# Patient Record
Sex: Female | Born: 1950 | Race: Black or African American | Hispanic: No | Marital: Married | Smoking: Never smoker
Health system: Southern US, Community
[De-identification: ages and names within clinical notes are randomized; demographics above are authoritative.]

## PROBLEM LIST (undated history)

## (undated) DIAGNOSIS — Z01419 Encounter for gynecological examination (general) (routine) without abnormal findings: Secondary | ICD-10-CM

## (undated) DIAGNOSIS — D649 Anemia, unspecified: Secondary | ICD-10-CM

## (undated) DIAGNOSIS — J449 Chronic obstructive pulmonary disease, unspecified: Secondary | ICD-10-CM

## (undated) DIAGNOSIS — I10 Essential (primary) hypertension: Secondary | ICD-10-CM

## (undated) DIAGNOSIS — I639 Cerebral infarction, unspecified: Secondary | ICD-10-CM

## (undated) DIAGNOSIS — M1990 Unspecified osteoarthritis, unspecified site: Secondary | ICD-10-CM

## (undated) DIAGNOSIS — I350 Nonrheumatic aortic (valve) stenosis: Secondary | ICD-10-CM

## (undated) DIAGNOSIS — E785 Hyperlipidemia, unspecified: Secondary | ICD-10-CM

## (undated) HISTORY — DX: Anemia, unspecified: D64.9

## (undated) HISTORY — DX: Essential (primary) hypertension: I10

## (undated) HISTORY — DX: Nonrheumatic aortic (valve) stenosis: I35.0

## (undated) HISTORY — DX: Chronic obstructive pulmonary disease, unspecified: J44.9

## (undated) HISTORY — DX: Cerebral infarction, unspecified: I63.9

## (undated) HISTORY — PX: OTHER SURGICAL HISTORY: SHX169

## (undated) HISTORY — PX: KNEE SURGERY: SHX244

## (undated) HISTORY — DX: Unspecified osteoarthritis, unspecified site: M19.90

## (undated) HISTORY — PX: CATARACT EXTRACTION: SUR2

## (undated) HISTORY — DX: Encounter for gynecological examination (general) (routine) without abnormal findings: Z01.419

## (undated) HISTORY — DX: Hyperlipidemia, unspecified: E78.5

---

## 1998-10-14 ENCOUNTER — Other Ambulatory Visit: Admission: RE | Admit: 1998-10-14 | Discharge: 1998-10-14 | Payer: Self-pay | Admitting: Obstetrics & Gynecology

## 1999-11-22 ENCOUNTER — Other Ambulatory Visit: Admission: RE | Admit: 1999-11-22 | Discharge: 1999-11-22 | Payer: Self-pay | Admitting: Obstetrics & Gynecology

## 2001-09-24 ENCOUNTER — Other Ambulatory Visit: Admission: RE | Admit: 2001-09-24 | Discharge: 2001-09-24 | Payer: Self-pay | Admitting: Obstetrics & Gynecology

## 2001-11-22 ENCOUNTER — Ambulatory Visit (HOSPITAL_COMMUNITY): Admission: RE | Admit: 2001-11-22 | Discharge: 2001-11-22 | Payer: Self-pay | Admitting: Obstetrics & Gynecology

## 2001-11-22 ENCOUNTER — Encounter (INDEPENDENT_AMBULATORY_CARE_PROVIDER_SITE_OTHER): Payer: Self-pay | Admitting: Specialist

## 2002-06-16 ENCOUNTER — Emergency Department (HOSPITAL_COMMUNITY): Admission: EM | Admit: 2002-06-16 | Discharge: 2002-06-16 | Payer: Self-pay

## 2003-04-17 ENCOUNTER — Other Ambulatory Visit: Admission: RE | Admit: 2003-04-17 | Discharge: 2003-04-17 | Payer: Self-pay | Admitting: Obstetrics & Gynecology

## 2004-03-01 ENCOUNTER — Emergency Department (HOSPITAL_COMMUNITY): Admission: EM | Admit: 2004-03-01 | Discharge: 2004-03-02 | Payer: Self-pay | Admitting: Podiatry

## 2005-01-05 ENCOUNTER — Other Ambulatory Visit: Admission: RE | Admit: 2005-01-05 | Discharge: 2005-01-05 | Payer: Self-pay | Admitting: Obstetrics & Gynecology

## 2005-02-22 ENCOUNTER — Encounter: Admission: RE | Admit: 2005-02-22 | Discharge: 2005-02-22 | Payer: Self-pay | Admitting: Orthopedic Surgery

## 2005-11-22 ENCOUNTER — Emergency Department (HOSPITAL_COMMUNITY): Admission: EM | Admit: 2005-11-22 | Discharge: 2005-11-22 | Payer: Self-pay | Admitting: Family Medicine

## 2006-06-27 ENCOUNTER — Emergency Department (HOSPITAL_COMMUNITY): Admission: EM | Admit: 2006-06-27 | Discharge: 2006-06-27 | Payer: Self-pay | Admitting: Emergency Medicine

## 2007-10-26 ENCOUNTER — Ambulatory Visit: Payer: Self-pay | Admitting: Family Medicine

## 2007-10-26 DIAGNOSIS — E782 Mixed hyperlipidemia: Secondary | ICD-10-CM | POA: Insufficient documentation

## 2007-10-26 DIAGNOSIS — I10 Essential (primary) hypertension: Secondary | ICD-10-CM | POA: Insufficient documentation

## 2007-10-26 LAB — HM MAMMOGRAPHY

## 2007-11-05 ENCOUNTER — Encounter: Payer: Self-pay | Admitting: Family Medicine

## 2007-11-20 ENCOUNTER — Ambulatory Visit: Payer: Self-pay | Admitting: Gastroenterology

## 2007-11-20 LAB — CONVERTED CEMR LAB
Basophils Absolute: 0 10*3/uL (ref 0.0–0.1)
Basophils Relative: 0.3 % (ref 0.0–1.0)
Eosinophils Absolute: 0 10*3/uL (ref 0.0–0.6)
Eosinophils Relative: 0.5 % (ref 0.0–5.0)
Ferritin: 52.3 ng/mL (ref 10.0–291.0)
Folate: 8.9 ng/mL
HCT: 30.1 % — ABNORMAL LOW (ref 36.0–46.0)
Hemoglobin: 9.8 g/dL — ABNORMAL LOW (ref 12.0–15.0)
Iron: 30 ug/dL — ABNORMAL LOW (ref 42–145)
Lymphocytes Relative: 27.7 % (ref 12.0–46.0)
MCHC: 32.7 g/dL (ref 30.0–36.0)
MCV: 76.8 fL — ABNORMAL LOW (ref 78.0–100.0)
Monocytes Absolute: 0.6 10*3/uL (ref 0.2–0.7)
Monocytes Relative: 7.1 % (ref 3.0–11.0)
Neutro Abs: 5.8 10*3/uL (ref 1.4–7.7)
Neutrophils Relative %: 64.4 % (ref 43.0–77.0)
Platelets: 222 10*3/uL (ref 150–400)
RBC: 3.91 M/uL (ref 3.87–5.11)
RDW: 15 % — ABNORMAL HIGH (ref 11.5–14.6)
Saturation Ratios: 7.7 % — ABNORMAL LOW (ref 20.0–50.0)
Tissue Transglutaminase Ab, IgA: 0.2 units (ref <7)
Transferrin: 277.2 mg/dL (ref >=212.0)
Vitamin B-12: 409 pg/mL (ref 211–911)
WBC: 8.9 10*3/uL (ref 4.5–10.5)

## 2007-11-21 ENCOUNTER — Ambulatory Visit: Payer: Self-pay | Admitting: Family Medicine

## 2007-11-21 DIAGNOSIS — R531 Weakness: Secondary | ICD-10-CM | POA: Insufficient documentation

## 2007-11-21 DIAGNOSIS — D509 Iron deficiency anemia, unspecified: Secondary | ICD-10-CM | POA: Insufficient documentation

## 2007-11-23 LAB — CONVERTED CEMR LAB
ALT: 13 units/L (ref 0–35)
AST: 18 units/L (ref 0–37)
Albumin: 3.6 g/dL (ref 3.5–5.2)
Alkaline Phosphatase: 68 units/L (ref 39–117)
BUN: 26 mg/dL — ABNORMAL HIGH (ref 6–23)
Bilirubin, Direct: 0.1 mg/dL (ref 0.0–0.3)
CO2: 30 meq/L (ref 19–32)
Calcium: 9.2 mg/dL (ref 8.4–10.5)
Chloride: 102 meq/L (ref 96–112)
Creatinine, Ser: 1 mg/dL (ref 0.4–1.2)
Creatinine,U: 148.5 mg/dL
GFR calc Af Amer: 73 mL/min
GFR calc non Af Amer: 61 mL/min
Glucose, Bld: 141 mg/dL — ABNORMAL HIGH (ref 70–99)
Hgb A1c MFr Bld: 7.9 % — ABNORMAL HIGH (ref 4.6–6.0)
Microalb Creat Ratio: 5.4 mg/g (ref 0.0–30.0)
Microalb, Ur: 0.8 mg/dL (ref 0.0–1.9)
Potassium: 4.5 meq/L (ref 3.5–5.1)
Sodium: 141 meq/L (ref 135–145)
TSH: 1.71 microintl units/mL (ref 0.35–5.50)
Total Bilirubin: 0.7 mg/dL (ref 0.3–1.2)
Total Protein: 7.1 g/dL (ref 6.0–8.3)

## 2007-11-26 ENCOUNTER — Encounter: Payer: Self-pay | Admitting: Family Medicine

## 2007-11-26 ENCOUNTER — Ambulatory Visit: Payer: Self-pay | Admitting: Gastroenterology

## 2007-12-06 ENCOUNTER — Ambulatory Visit: Payer: Self-pay | Admitting: Family Medicine

## 2008-01-09 ENCOUNTER — Ambulatory Visit: Payer: Self-pay | Admitting: Family Medicine

## 2008-01-09 DIAGNOSIS — M545 Low back pain, unspecified: Secondary | ICD-10-CM | POA: Insufficient documentation

## 2008-01-17 ENCOUNTER — Encounter: Payer: Self-pay | Admitting: Family Medicine

## 2008-04-02 ENCOUNTER — Encounter: Payer: Self-pay | Admitting: Family Medicine

## 2008-07-03 ENCOUNTER — Encounter: Payer: Self-pay | Admitting: Family Medicine

## 2008-10-20 ENCOUNTER — Encounter: Payer: Self-pay | Admitting: Family Medicine

## 2009-01-28 ENCOUNTER — Encounter: Payer: Self-pay | Admitting: Family Medicine

## 2009-04-07 ENCOUNTER — Encounter: Payer: Self-pay | Admitting: Cardiology

## 2009-05-07 ENCOUNTER — Ambulatory Visit: Payer: Self-pay | Admitting: Cardiology

## 2009-05-07 ENCOUNTER — Encounter: Payer: Self-pay | Admitting: Family Medicine

## 2009-05-07 DIAGNOSIS — R079 Chest pain, unspecified: Secondary | ICD-10-CM | POA: Insufficient documentation

## 2009-05-07 DIAGNOSIS — E669 Obesity, unspecified: Secondary | ICD-10-CM | POA: Insufficient documentation

## 2009-05-07 DIAGNOSIS — R011 Cardiac murmur, unspecified: Secondary | ICD-10-CM | POA: Insufficient documentation

## 2009-05-18 ENCOUNTER — Encounter: Payer: Self-pay | Admitting: Cardiology

## 2009-05-19 ENCOUNTER — Telehealth (INDEPENDENT_AMBULATORY_CARE_PROVIDER_SITE_OTHER): Payer: Self-pay | Admitting: *Deleted

## 2009-05-20 ENCOUNTER — Ambulatory Visit: Payer: Self-pay

## 2009-05-20 ENCOUNTER — Encounter: Payer: Self-pay | Admitting: Cardiology

## 2009-05-21 ENCOUNTER — Ambulatory Visit: Payer: Self-pay

## 2009-05-27 ENCOUNTER — Telehealth: Payer: Self-pay | Admitting: Cardiology

## 2009-09-10 ENCOUNTER — Encounter: Payer: Self-pay | Admitting: Cardiology

## 2009-09-10 ENCOUNTER — Encounter: Payer: Self-pay | Admitting: Family Medicine

## 2009-09-10 ENCOUNTER — Encounter (INDEPENDENT_AMBULATORY_CARE_PROVIDER_SITE_OTHER): Payer: Self-pay | Admitting: *Deleted

## 2010-01-07 ENCOUNTER — Encounter: Payer: Self-pay | Admitting: Cardiology

## 2010-01-07 ENCOUNTER — Encounter: Payer: Self-pay | Admitting: Family Medicine

## 2010-01-22 ENCOUNTER — Encounter: Payer: Self-pay | Admitting: Cardiology

## 2010-02-11 ENCOUNTER — Ambulatory Visit: Payer: Self-pay | Admitting: Family Medicine

## 2010-04-08 ENCOUNTER — Encounter: Payer: Self-pay | Admitting: Cardiology

## 2010-04-08 ENCOUNTER — Encounter: Payer: Self-pay | Admitting: Family Medicine

## 2010-04-23 ENCOUNTER — Encounter: Payer: Self-pay | Admitting: Cardiology

## 2010-04-26 ENCOUNTER — Ambulatory Visit: Payer: Self-pay | Admitting: Cardiology

## 2010-04-26 DIAGNOSIS — I359 Nonrheumatic aortic valve disorder, unspecified: Secondary | ICD-10-CM | POA: Insufficient documentation

## 2010-05-03 ENCOUNTER — Ambulatory Visit: Payer: Self-pay | Admitting: Cardiology

## 2010-05-03 ENCOUNTER — Ambulatory Visit (HOSPITAL_COMMUNITY): Admission: RE | Admit: 2010-05-03 | Discharge: 2010-05-03 | Payer: Self-pay | Admitting: Cardiology

## 2010-05-03 ENCOUNTER — Ambulatory Visit: Payer: Self-pay

## 2010-05-03 ENCOUNTER — Encounter: Payer: Self-pay | Admitting: Cardiology

## 2010-05-12 ENCOUNTER — Telehealth: Payer: Self-pay | Admitting: Cardiology

## 2010-07-05 ENCOUNTER — Ambulatory Visit: Payer: Self-pay | Admitting: Family Medicine

## 2010-07-20 ENCOUNTER — Encounter: Payer: Self-pay | Admitting: Family Medicine

## 2010-07-20 ENCOUNTER — Encounter: Payer: Self-pay | Admitting: Cardiology

## 2010-08-18 ENCOUNTER — Ambulatory Visit: Payer: Self-pay | Admitting: Family Medicine

## 2010-11-05 ENCOUNTER — Encounter: Payer: Self-pay | Admitting: Cardiology

## 2010-11-09 NOTE — Assessment & Plan Note (Signed)
Summary: 6 month rov/njr   Vital Signs:  Patient profile:   60 year old female Weight:      293 pounds O2 Sat:      94 % Temp:     97.5 degrees F Pulse rate:   56 / minute BP sitting:   140 / 84  (left arm) Cuff size:   large  Vitals Entered By: Pura Spice, RN (August 18, 2010 8:56 AM) CC: 6 month follow up feet edematous    History of Present Illness: Here for a 6 month follow up. She feels fine in general except for some fatigue. She sees Dr. Leslie Dales every 3 months, and her DM has been stable. Her A1c last month was 6.3. She saw Dr. Donne Hazel a few months ago, and he seemed satisfied. He increased her Quinapril, and her BP is stable. He did an ECHO on her which showed good LV function and insignificant valvular disease.   Allergies (verified): No Known Drug Allergies  Past History:  Past Medical History: Reviewed history from 02/11/2010 and no changes required. Diabetes mellitus, type II,  x 15 years (sees Dr. Leslie Dales) Hyperlipidemia x years Hypertension x 15 years Anemia-iron deficiency sees Dr. Jennette Kettle for gyn exams Low back pain, sees Dr. Shirlee Latch Cataracts, (sees Dr. Wayna Chalet) sees Dr. Antoine Poche for cardiology exams ECHO on 05-20-09 showed normal LV function and mild aortic sclerosis  Past Surgical History: Reviewed history from 05/07/2009 and no changes required. D & C Excision of benign cyst from left maxilla and left maxillary sinus colonoscopy per Dr. Jarold Motto 11-26-07, repeat in 10 yrs  Review of Systems  The patient denies anorexia, fever, weight loss, weight gain, vision loss, decreased hearing, hoarseness, chest pain, syncope, dyspnea on exertion, prolonged cough, headaches, hemoptysis, abdominal pain, melena, hematochezia, severe indigestion/heartburn, hematuria, incontinence, genital sores, muscle weakness, suspicious skin lesions, transient blindness, difficulty walking, depression, unusual weight change, abnormal bleeding, enlarged lymph nodes,  angioedema, breast masses, and testicular masses.    Physical Exam  General:  overweight-appearing.   Neck:  No deformities, masses, or tenderness noted. Lungs:  Normal respiratory effort, chest expands symmetrically. Lungs are clear to auscultation, no crackles or wheezes. Heart:  normal rate, regular rhythm, no gallop, no rub, no JVD, and no HJR.  Soft 2/6 SM at the left sternal border  Extremities:  2+ left pedal edema and 2+ right pedal edema.     Impression & Recommendations:  Problem # 1:  AORTIC VALVE DISORDERS (ICD-424.1)  Her updated medication list for this problem includes:    Baby Aspirin 81 Mg Chew (Aspirin) ..... Once daily    Metoprolol Succinate 100 Mg Tb24 (Metoprolol succinate) .Marland Kitchen... 1 by mouth once daily  Problem # 2:  OBESITY, UNSPECIFIED (ICD-278.00)  Problem # 3:  ANEMIA-IRON DEFICIENCY (ICD-280.9)  Her updated medication list for this problem includes:    Ferrex 150 150 Mg Caps (Polysaccharide iron complex) .Marland Kitchen... 1 by mouth daily  Problem # 4:  HYPERTENSION (ICD-401.9)  Her updated medication list for this problem includes:    Doxazosin Mesylate 8 Mg Tabs (Doxazosin mesylate) .Marland Kitchen... 1 by mouth once daily    Quinapril Hcl 40 Mg Tabs (Quinapril hcl) ..... One daily    Metoprolol Succinate 100 Mg Tb24 (Metoprolol succinate) .Marland Kitchen... 1 by mouth once daily    Cartia Xt 300 Mg Cp24 (Diltiazem hcl coated beads) .Marland Kitchen... 1 by mouth once daily    Tekturna Hct 300-25 Mg Tabs (Aliskiren-hydrochlorothiazide) ..... Once daily    Bumetanide  1 Mg Tabs (Bumetanide) .Marland Kitchen... 2 once daily  Problem # 5:  DIABETES MELLITUS, TYPE II (ICD-250.00)  Her updated medication list for this problem includes:    Novolog Penfill 100 Unit/ml Soln (Insulin aspart) .Marland Kitchen... 8 u before lunch,    Lantus Solostar 100 Unit/ml Soln (Insulin glargine) .Marland KitchenMarland KitchenMarland KitchenMarland Kitchen 10 u at bedtime    Glimepiride 4 Mg Tabs (Glimepiride) .Marland Kitchen... 1/2 by mouth daily    Baby Aspirin 81 Mg Chew (Aspirin) ..... Once daily    Quinapril  Hcl 40 Mg Tabs (Quinapril hcl) ..... One daily    Actos 45 Mg Tabs (Pioglitazone hcl) .Marland Kitchen... 1 by mouth once daily    Januvia 100 Mg Tabs (Sitagliptin phosphate) ..... Once daily  Complete Medication List: 1)  Novolog Penfill 100 Unit/ml Soln (Insulin aspart) .... 8 u before lunch, 2)  Lantus Solostar 100 Unit/ml Soln (Insulin glargine) .Marland Kitchen.. 10 u at bedtime 3)  Glimepiride 4 Mg Tabs (Glimepiride) .... 1/2 by mouth daily 4)  Baby Aspirin 81 Mg Chew (Aspirin) .... Once daily 5)  Doxazosin Mesylate 8 Mg Tabs (Doxazosin mesylate) .Marland Kitchen.. 1 by mouth once daily 6)  Quinapril Hcl 40 Mg Tabs (Quinapril hcl) .... One daily 7)  Actos 45 Mg Tabs (Pioglitazone hcl) .Marland Kitchen.. 1 by mouth once daily 8)  Metoprolol Succinate 100 Mg Tb24 (Metoprolol succinate) .Marland Kitchen.. 1 by mouth once daily 9)  Lipitor 40 Mg Tabs (Atorvastatin calcium) .Marland Kitchen.. 1 by mouth once daily 10)  Cartia Xt 300 Mg Cp24 (Diltiazem hcl coated beads) .Marland Kitchen.. 1 by mouth once daily 11)  Methocarbamol 500 Mg Tabs (Methocarbamol) .... As directed 12)  Januvia 100 Mg Tabs (Sitagliptin phosphate) .... Once daily 13)  Tekturna Hct 300-25 Mg Tabs (Aliskiren-hydrochlorothiazide) .... Once daily 14)  Bumetanide 1 Mg Tabs (Bumetanide) .... 2 once daily 15)  Ferrex 150 150 Mg Caps (Polysaccharide iron complex) .Marland Kitchen.. 1 by mouth daily  Patient Instructions: 1)  It is important that you exercise reguarly at least 20 minutes 5 times a week. If you develop chest pain, have severe difficulty breathing, or feel very tired, stop exercising immediately and seek medical attention.  2)  You need to lose weight. Consider a lower calorie diet and regular exercise.  3)  Advised her to watch her sodium intake closely and to keep this below 2000 mg a day. 4)  Please schedule a follow-up appointment in 6 months .    Orders Added: 1)  Est. Patient Level IV [04540]

## 2010-11-09 NOTE — Letter (Signed)
Summary: [facility] Progress Note  [facility] Progress Note   Imported By: Roderic Ovens 01/22/2010 16:11:50  _____________________________________________________________________  External Attachment:    Type:   Image     Comment:   External Document

## 2010-11-09 NOTE — Letter (Signed)
Summary: [facility] Endocrinology & Diabetes  [facility] Endocrinology & Diabetes   Imported By: Maryln Gottron 07/23/2010 10:41:19  _____________________________________________________________________  External Attachment:    Type:   Image     Comment:   External Document

## 2010-11-09 NOTE — Letter (Signed)
Summary: [facility] Endocrinology and Diabetes  [facility]   Imported By: Maryln Gottron 04/22/2010 15:24:53  _____________________________________________________________________  External Attachment:    Type:   Image     Comment:   External Document

## 2010-11-09 NOTE — Letter (Signed)
Summary: [facility] Endocrinology & Diabetes  [facility] Endocrinology & Diabetes   Imported By: Maryln Gottron 01/15/2010 14:34:10  _____________________________________________________________________  External Attachment:    Type:   Image     Comment:   External Document

## 2010-11-09 NOTE — Assessment & Plan Note (Signed)
Summary: CPX/njr   Vital Signs:  Patient profile:   60 year old female Height:      65 inches Weight:      268 pounds BMI:     44.76 Pulse rate:   64 / minute Pulse rhythm:   regular BP sitting:   124 / 62  (left arm) Cuff size:   large  Vitals Entered By: Raechel Ache, RN (Feb 11, 2010 8:59 AM) CC: CPX, labs done recently elsewhere. Sees gyn and cardiologist. FBS 69 today. Is Patient Diabetic? Yes   History of Present Illness: 60 yr old female for a cpx. She feels well in general and has no concerns today. She has been overweight all her life, but she has made tremendous strides over the past few months by seeing a personal trainer to set up an exercise routine for her to follow. She has also been seeing a nutritionist who has changed her diet greatly. Now she avoids all refined sugars, uses brown bread products rather than white, eats lots of vegetables but limited fruits. She has cut back on prok and beef, and she eats more poultry and fish. She has lost some weight and feels better. She had complete labs in March per Dr. Leslie Dales, and these all look great except her mild anemia. Her Hgb is 10.3. Her diabetes is well controlled with an A1c of 6.6.    Allergies (verified): No Known Drug Allergies  Past History:  Past Medical History: Diabetes mellitus, type II,  x 15 years (sees Dr. Leslie Dales) Hyperlipidemia x years Hypertension x 15 years Anemia-iron deficiency sees Dr. Jennette Kettle for gyn exams Low back pain, sees Dr. Shirlee Latch Cataracts, (sees Dr. Wayna Chalet) sees Dr. Antoine Poche for cardiology exams ECHO on 05-20-09 showed normal LV function and mild aortic sclerosis  Past Surgical History: Reviewed history from 05/07/2009 and no changes required. D & C Excision of benign cyst from left maxilla and left maxillary sinus colonoscopy per Dr. Jarold Motto 11-26-07, repeat in 10 yrs  Family History: Reviewed history from 05/07/2009 and no changes required. Family History of  cardiac disease but not CAD  (Mother with a pacemaker, brother with heart valve replacement died) Family History of Colon CA 1st degree relative <60 Family History Diabetes 1st degree relative Family History High cholesterol Family History Hypertension Family History of Stroke F 1st degree relative <60  Social History: Reviewed history from 05/07/2009 and no changes required. Married Never Smoked Alcohol use-no Drug use-no Regular exercise-yes  Review of Systems  The patient denies anorexia, fever, weight loss, weight gain, vision loss, decreased hearing, hoarseness, chest pain, syncope, dyspnea on exertion, peripheral edema, prolonged cough, headaches, hemoptysis, abdominal pain, melena, hematochezia, severe indigestion/heartburn, hematuria, incontinence, genital sores, muscle weakness, suspicious skin lesions, transient blindness, difficulty walking, depression, unusual weight change, abnormal bleeding, enlarged lymph nodes, angioedema, breast masses, and testicular masses.    Physical Exam  General:  overweight-appearing.   Head:  Normocephalic and atraumatic without obvious abnormalities. No apparent alopecia or balding. Eyes:  No corneal or conjunctival inflammation noted. EOMI. Perrla. Funduscopic exam benign, without hemorrhages, exudates or papilledema. Vision grossly normal. Ears:  External ear exam shows no significant lesions or deformities.  Otoscopic examination reveals clear canals, tympanic membranes are intact bilaterally without bulging, retraction, inflammation or discharge. Hearing is grossly normal bilaterally. Nose:  External nasal examination shows no deformity or inflammation. Nasal mucosa are pink and moist without lesions or exudates. Mouth:  Oral mucosa and oropharynx without lesions or exudates.  Teeth in good repair. Neck:  No deformities, masses, or tenderness noted. Chest Wall:  No deformities, masses, or tenderness noted. Lungs:  Normal respiratory effort,  chest expands symmetrically. Lungs are clear to auscultation, no crackles or wheezes. Heart:  normal rate, regular rhythm, no gallop, no rub, and no JVD.  Soft 2/6 SM at the base Abdomen:  Bowel sounds positive,abdomen soft and non-tender without masses, organomegaly or hernias noted. Msk:  No deformity or scoliosis noted of thoracic or lumbar spine.   Pulses:  R and L carotid,radial,femoral,dorsalis pedis and posterior tibial pulses are full and equal bilaterally Extremities:  No clubbing, cyanosis, edema, or deformity noted with normal full range of motion of all joints.   Neurologic:  No cranial nerve deficits noted. Station and gait are normal. Plantar reflexes are down-going bilaterally. DTRs are symmetrical throughout. Sensory, motor and coordinative functions appear intact. Skin:  Intact without suspicious lesions or rashes Cervical Nodes:  No lymphadenopathy noted Axillary Nodes:  No palpable lymphadenopathy Inguinal Nodes:  No significant adenopathy Psych:  Cognition and judgment appear intact. Alert and cooperative with normal attention span and concentration. No apparent delusions, illusions, hallucinations   Impression & Recommendations:  Problem # 1:  WELL ADULT EXAM (ICD-V70.0)  Complete Medication List: 1)  Novolog Penfill 100 Unit/ml Soln (Insulin aspart) .... 8 u before lunch, 2)  Lantus Solostar 100 Unit/ml Soln (Insulin glargine) .Marland Kitchen.. 10 u at bedtime 3)  Glimepiride 4 Mg Tabs (Glimepiride) .Marland Kitchen.. 1 by mouth once daily 4)  Baby Aspirin 81 Mg Chew (Aspirin) .... Once daily 5)  Doxazosin Mesylate 8 Mg Tabs (Doxazosin mesylate) .Marland Kitchen.. 1 by mouth once daily 6)  Quinapril Hcl 40 Mg Tabs (Quinapril hcl) .... 1/2  by mouth once daily 7)  Actos 45 Mg Tabs (Pioglitazone hcl) .Marland Kitchen.. 1 by mouth once daily 8)  Metoprolol Succinate 100 Mg Tb24 (Metoprolol succinate) .Marland Kitchen.. 1 by mouth once daily 9)  Lipitor 40 Mg Tabs (Atorvastatin calcium) .Marland Kitchen.. 1 by mouth once daily 10)  Cartia Xt 300 Mg  Cp24 (Diltiazem hcl coated beads) .Marland Kitchen.. 1 by mouth once daily 11)  Methocarbamol 500 Mg Tabs (Methocarbamol) .... One tid 12)  Tramadol Hcl 50 Mg Tabs (Tramadol hcl) .... One -two q 4 hr. for pain 13)  Januvia 100 Mg Tabs (Sitagliptin phosphate) .... Once daily 14)  Tekturna Hct 300-25 Mg Tabs (Aliskiren-hydrochlorothiazide) .... Once daily 15)  Bumetanide 1 Mg Tabs (Bumetanide) .... 2 once daily  Patient Instructions: 1)  Please schedule a follow-up appointment in 6 months .  2)  It is important that you exercise reguarly at least 20 minutes 5 times a week. If you develop chest pain, have severe difficulty breathing, or feel very tired, stop exercising immediately and seek medical attention.  3)  You need to lose weight. Consider a lower calorie diet and regular exercise.

## 2010-11-09 NOTE — Miscellaneous (Signed)
Clinical Lists Changes  Observations: Added new observation of NUCLEAR NOS: Exercise Capacity: Adenosine study with no exercise. BP Response: Normal blood pressure response. Clinical Symptoms: No chest pain ECG Impression: No significant ST segment change suggestive of ischemia. Overall Impression Comments: Myoview scan with thinning in the anterior/anteroseptal walls (mid).  Most like reflects soft tissue attenuation (breast).  No evidence for significant ischemia.  Overall low risk scan.   (05/20/2009 13:34) Added new observation of ECHOINTERP: 1. Left ventricle: The cavity size was normal. Wall thickness was        increased in a pattern of mild LVH. Systolic function was normal.        The estimated ejection fraction was in the range of 60% to 65%.        Wall motion was normal; there were no regional wall motion        abnormalities. Doppler parameters are consistent with abnormal        left ventricular relaxation (grade 1 diastolic dysfunction).     2. Aortic valve: Sclerosis without stenosis. Mean gradient: 7mm Hg        (S).     3. Mitral valve: Trivial regurgitation.     4. Left atrium: The atrium was mildly dilated.     5. Common pulmonary vein: The Doppler velocity and flow profile were        normal.     6. Right ventricle: The cavity size was normal. Systolic function        was normal.     7. Right atrium: The atrium was mildly dilated.     8. Pulmonary arteries: PA systolic pressure 34-38 mmHg.     9. Systemic veins: IVC measures 2.2 cm with normal respirophasic        variation, suggesting RA pressure 6-10 mmHg.     Impressions:            - Normal LV size and systolic function, EF 60-65%. Mild LV       hypertrophy. Normal RV size and systolic function. Aortic       sclerosis may be the cause of the murmur. (05/20/2009 13:33)      Echocardiogram  Procedure date:  05/20/2009  Findings:      1. Left ventricle: The cavity size was normal. Wall thickness  was        increased in a pattern of mild LVH. Systolic function was normal.        The estimated ejection fraction was in the range of 60% to 65%.        Wall motion was normal; there were no regional wall motion        abnormalities. Doppler parameters are consistent with abnormal        left ventricular relaxation (grade 1 diastolic dysfunction).     2. Aortic valve: Sclerosis without stenosis. Mean gradient: 7mm Hg        (S).     3. Mitral valve: Trivial regurgitation.     4. Left atrium: The atrium was mildly dilated.     5. Common pulmonary vein: The Doppler velocity and flow profile were        normal.     6. Right ventricle: The cavity size was normal. Systolic function        was normal.     7. Right atrium: The atrium was mildly dilated.     8. Pulmonary arteries: PA systolic pressure 34-38 mmHg.     9. Systemic  veins: IVC measures 2.2 cm with normal respirophasic        variation, suggesting RA pressure 6-10 mmHg.     Impressions:            - Normal LV size and systolic function, EF 60-65%. Mild LV       hypertrophy. Normal RV size and systolic function. Aortic       sclerosis may be the cause of the murmur.  Nuclear Study  Procedure date:  05/20/2009  Findings:      Exercise Capacity: Adenosine study with no exercise. BP Response: Normal blood pressure response. Clinical Symptoms: No chest pain ECG Impression: No significant ST segment change suggestive of ischemia. Overall Impression Comments: Myoview scan with thinning in the anterior/anteroseptal walls (mid).  Most like reflects soft tissue attenuation (breast).  No evidence for significant ischemia.  Overall low risk scan.

## 2010-11-09 NOTE — Letter (Signed)
Summary: [facility] Office Progress Note   [facility] Office Progress Note   Imported By: Roderic Ovens 04/21/2010 10:19:13  _____________________________________________________________________  External Attachment:    Type:   Image     Comment:   External Document

## 2010-11-09 NOTE — Progress Notes (Signed)
Summary: echo results   Phone Note Call from Patient   Caller: Mom Reason for Call: Talk to Nurse, Lab or Test Results Summary of Call: PT CALLING RE ECHO RESULTS -PLS CALL [phone] Initial call taken by: Glynda Jaeger,  May 12, 2010 10:28 AM  Follow-up for Phone Call        I called the pt. I gave her a prelimary on her echo results and made her aware that Dr. Antoine Poche has not reviewed her results yet. I explained we will call her back after the final report is reviewed by him. She is agreeable. Follow-up by: Sherri Rad, RN, BSN,  May 12, 2010 11:14 AM

## 2010-11-09 NOTE — Assessment & Plan Note (Signed)
Summary: PER CHECK OUT/SF  Medications Added GLIMEPIRIDE 4 MG  TABS (GLIMEPIRIDE) 1/2 by mouth daily QUINAPRIL HCL 40 MG  TABS (QUINAPRIL HCL) one daily METHOCARBAMOL 500 MG  TABS (METHOCARBAMOL) as directed FERREX 150 150 MG CAPS (POLYSACCHARIDE IRON COMPLEX) 1 by mouth daily      Allergies Added: NKDA  Visit Type:  Follow-up Primary Provider:  Nelwyn Salisbury MD  CC:  HTN and Murmur.  History of Present Illness: The patient presents for followup of mild aortic stenosis and hypertension. I saw her last year and she had chest discomfort. Stress perfusion imaging demonstrated a well preserved ejection fraction with no ischemia or infarct. Echocardiography to evaluate her murmur demonstrated some mild LVH and aortic valve thickening with a minimal gradient. Over the past year she has been on a weight loss program and lost up to 46 pounds though she gained some back. She feels better with this. She still has some breathlessness climbing stairs but no dyspnea at rest, PND or orthopnea. She has some fleeting chest discomfort but no classic symptoms of chest pressure, neck or arm discomfort. There are no new palpitations, presyncope or syncope. She has no new swelling or weight gain.  Current Medications (verified): 1)  Novolog Penfill 100 Unit/ml  Soln (Insulin Aspart) .... 8 U Before Lunch, 2)  Lantus Solostar 100 Unit/ml  Soln (Insulin Glargine) .Marland Kitchen.. 10 U At Bedtime 3)  Glimepiride 4 Mg  Tabs (Glimepiride) .... 1/2 By Mouth Daily 4)  Baby Aspirin 81 Mg  Chew (Aspirin) .... Once Daily 5)  Doxazosin Mesylate 8 Mg  Tabs (Doxazosin Mesylate) .Marland Kitchen.. 1 By Mouth Once Daily 6)  Quinapril Hcl 40 Mg  Tabs (Quinapril Hcl) .... 1/2  By Mouth Once Daily 7)  Actos 45 Mg  Tabs (Pioglitazone Hcl) .Marland Kitchen.. 1 By Mouth Once Daily 8)  Metoprolol Succinate 100 Mg  Tb24 (Metoprolol Succinate) .Marland Kitchen.. 1 By Mouth Once Daily 9)  Lipitor 40 Mg  Tabs (Atorvastatin Calcium) .Marland Kitchen.. 1 By Mouth Once Daily 10)  Cartia Xt 300 Mg  Cp24  (Diltiazem Hcl Coated Beads) .Marland Kitchen.. 1 By Mouth Once Daily 11)  Methocarbamol 500 Mg  Tabs (Methocarbamol) .... As Directed 12)  Januvia 100 Mg  Tabs (Sitagliptin Phosphate) .... Once Daily 13)  Tekturna Hct 300-25 Mg  Tabs (Aliskiren-Hydrochlorothiazide) .... Once Daily 14)  Bumetanide 1 Mg  Tabs (Bumetanide) .... 2 Once Daily 15)  Ferrex 150 150 Mg Caps (Polysaccharide Iron Complex) .Marland Kitchen.. 1 By Mouth Daily  Allergies (verified): No Known Drug Allergies  Past History:  Past Medical History: Reviewed history from 02/11/2010 and no changes required. Diabetes mellitus, type II,  x 15 years (sees Dr. Leslie Dales) Hyperlipidemia x years Hypertension x 15 years Anemia-iron deficiency sees Dr. Jennette Kettle for gyn exams Low back pain, sees Dr. Shirlee Latch Cataracts, (sees Dr. Wayna Chalet) sees Dr. Antoine Poche for cardiology exams ECHO on 05-20-09 showed normal LV function and mild aortic sclerosis  Past Surgical History: Reviewed history from 05/07/2009 and no changes required. D & C Excision of benign cyst from left maxilla and left maxillary sinus colonoscopy per Dr. Jarold Motto 11-26-07, repeat in 10 yrs  Review of Systems       She does complain of some back pain and joint pains. Otherwise as stated in the history of present illness negative for other systems.  Vital Signs:  Patient profile:   60 year old female Height:      65 inches Weight:      281 pounds BMI:  46.93 Pulse rate:   75 / minute Resp:     18 per minute BP sitting:   154 / 82  (right arm)  Vitals Entered By: Marrion Coy, CNA (April 26, 2010 10:11 AM)  Physical Exam  General:  Well developed, well nourished, in no acute distress. Head:  Normocephalic and atraumatic without obvious abnormalities. No apparent alopecia or balding. Eyes:  PERRLA/EOM intact; conjunctiva and lids normal. Neck:  Neck supple, no JVD. No masses, thyromegaly or abnormal cervical nodes. Chest Wall:  no deformities or breast masses noted Lungs:   Clear bilaterally to auscultation and percussion. Abdomen:  Bowel sounds positive,abdomen soft and non-tender without masses, organomegaly or hernias noted.(Exam compromised by morbid obesity.) Msk:  Back normal, normal gait. Muscle strength and tone normal. Extremities:  No clubbing or cyanosis. Neurologic:  Alert and oriented x 3. Skin:  Intact without lesions or rashes. Cervical Nodes:  no significant adenopathy Psych:  Normal affect.   Detailed Cardiovascular Exam  Neck    Carotids: Carotids full and equal bilaterally without bruits, positive or transmitted systolic murmur     Neck Veins: Normal, no JVD.    Heart    Inspection: no deformities or lifts noted.      Palpation: normal PMI with no thrills palpable.      Auscultation: S1 and S2 within normal limits, no S3, no S4, no clicks, no rubs, 3/6 apical systolic murmur radiating up the aortic outflow tract heard best at the right upper sternal border, no diastolic murmurs  Vascular    Abdominal Aorta: no palpable masses, pulsations, or audible bruits.      Pedal Pulses: normal pedal pulses bilaterally.      Radial Pulses: normal radial pulses bilaterally.      Peripheral Circulation: no clubbing, cyanosis, or edema noted with normal capillary refill.     EKG  Procedure date:  04/26/2010  Findings:      Sus rhythm, rate 75, left axis deviation, poor anterior R-wave progression, no acute ST-T wave changes.  Impression & Recommendations:  Problem # 1:  AORTIC VALVE DISORDERS (ICD-424.1) Her murmur may be slightly louder. I have asked her to get another echocardiogram. If this is stable I will probably reassess by physical exam plus or minus echo in 2 years. Orders: Echocardiogram (Echo)  Problem # 2:  OBESITY, UNSPECIFIED (ICD-278.00) I am very proud of her for losing weight and encourage continued gradual weight loss through sensible lifestyle changes.  Problem # 3:  HYPERTENSION (ICD-401.9) She reports that her blood  pressure is typically elevated at this level. I believe that her blood pressure medication dose was reduced as she lost weight. However, she gained some back and so I will go up on the Accupril to 40 mg daily.  Other Orders: EKG w/ Interpretation (93000)  Patient Instructions: 1)  Your physician recommends that you schedule a follow-up appointment in: 24 months with Dr Antoine Poche 2)  Your physician has recommended you make the following change in your medication: Increase Quinipril to 40 mg daily 3)  Your physician has requested that you have an echocardiogram.  Echocardiography is a painless test that uses sound waves to create images of your heart. It provides your doctor with information about the size and shape of your heart and how well your heart's chambers and valves are working.  This procedure takes approximately one hour. There are no restrictions for this procedure. Prescriptions: QUINAPRIL HCL 40 MG  TABS (QUINAPRIL HCL) one daily  #30 x  11   Entered by:   Charolotte Capuchin, RN   Authorized by:   Rollene Rotunda, MD, [facility]   Signed by:   Charolotte Capuchin, RN on 04/26/2010   Method used:   Electronically to        [facility] [address] [phone]* (retail)       [address]       [place], [place]  [address]       Ph: [phone] or [phone]       Fax: [phone]   RxID:   [id number]  I have reviewed and approved all prescriptions at the time of this visit. Rollene Rotunda, MD, [facility]  April 26, 2010 10:42 AM

## 2010-11-09 NOTE — Assessment & Plan Note (Signed)
Summary: FLU-SHOT/RCD   Nurse Visit   Allergies: No Known Drug Allergies  Review of Systems       Flu Vaccine Consent Questions     Do you have a history of severe allergic reactions to this vaccine? no    Any prior history of allergic reactions to egg and/or gelatin? no    Do you have a sensitivity to the preservative Thimersol? no    Do you have a past history of Guillan-Barre Syndrome? no    Do you currently have an acute febrile illness? no    Have you ever had a severe reaction to latex? no    Vaccine information given and explained to patient? yes    Are you currently pregnant? no    Lot Number:[id number]   Exp Date:04/09/2011   Site Given  Left Deltoid IM Pura Spice, RN  July 05, 2010 11:06 AM    Orders Added: 1)  Admin 1st Vaccine [90471] 2)  Flu Vaccine 64yrs + [16109]

## 2010-11-09 NOTE — Letter (Signed)
Summary: [facility] Office Progress Note   [facility] Office Progress Note   Imported By: Roderic Ovens 08/09/2010 10:34:17  _____________________________________________________________________  External Attachment:    Type:   Image     Comment:   External Document

## 2010-12-07 NOTE — Letter (Signed)
Summary: [facility] Office Progress Note   [facility] Office Progress Note   Imported By: Roderic Ovens 12/01/2010 12:39:39  _____________________________________________________________________  External Attachment:    Type:   Image     Comment:   External Document

## 2010-12-31 ENCOUNTER — Encounter: Payer: Self-pay | Admitting: Family Medicine

## 2011-02-15 ENCOUNTER — Encounter: Payer: Self-pay | Admitting: Family Medicine

## 2011-02-16 ENCOUNTER — Ambulatory Visit: Payer: Self-pay | Admitting: Family Medicine

## 2011-02-18 ENCOUNTER — Encounter: Payer: Self-pay | Admitting: Family Medicine

## 2011-02-18 ENCOUNTER — Ambulatory Visit (INDEPENDENT_AMBULATORY_CARE_PROVIDER_SITE_OTHER): Payer: BC Managed Care – PPO | Admitting: Family Medicine

## 2011-02-18 VITALS — BP 142/90 | HR 65 | Temp 98.3°F | Wt 313.0 lb

## 2011-02-18 DIAGNOSIS — J40 Bronchitis, not specified as acute or chronic: Secondary | ICD-10-CM

## 2011-02-18 MED ORDER — AZITHROMYCIN 250 MG PO TABS: ORAL_TABLET | ORAL | Status: AC

## 2011-02-18 MED ORDER — ALBUTEROL SULFATE HFA 108 (90 BASE) MCG/ACT IN AERS: 2.0000 | INHALATION_SPRAY | RESPIRATORY_TRACT | Status: DC | PRN

## 2011-02-18 NOTE — Progress Notes (Signed)
  Subjective:    Patient ID: Krista Benjamin, female    DOB: 09/05/1951, 60 y.o.   MRN: [id number]  HPI Here for 3 days of chest tightness, coughing up green sputum, and PND. No fever.    Review of Systems  Constitutional: Negative.   HENT: Positive for congestion, postnasal drip and sinus pressure.   Eyes: Negative.   Respiratory: Positive for cough.   Cardiovascular: Negative.        Objective:   Physical Exam  Constitutional: She appears well-nourished.  HENT:  Right Ear: External ear normal.  Left Ear: External ear normal.  Nose: Nose normal.  Mouth/Throat: Oropharynx is clear and moist. No oropharyngeal exudate.  Eyes: Conjunctivae are normal. Pupils are equal, round, and reactive to light.  Cardiovascular: Normal rate, regular rhythm, normal heart sounds and intact distal pulses.   Pulmonary/Chest: Effort normal. She has no wheezes. She has no rales.       Scattered rhonchi  Lymphadenopathy:    She has no cervical adenopathy.          Assessment & Plan:  Add Delsym prn

## 2011-02-22 NOTE — Assessment & Plan Note (Signed)
[name]                         GASTROENTEROLOGY OFFICE NOTE   NAME:Benjamin, Krista                  MRN:          [id number]  DATE:11/20/2007                            DOB:          1951/02/15    REFERRING PHYSICIAN:  Tera Mater. Clent Ridges, MD   CHIEF COMPLAINT:  Krista Benjamin is a 61 year old brittle diabetic, referred  by Dr. Tera Mater. Clent Ridges for a screening colonoscopy.   HISTORY:  Krista Benjamin denies any GI complaints whatsoever, except for  occasional alternating diarrhea and constipation.  She denies upper GI  or hepatobiliary problems.  She denies melena, hematochezia or lower  abdominal pain.  What I can ascertain on reviewing her records, she has  never had a colonoscopy or barium studies of her bowels, but apparently  did have a flexible sigmoidoscopy some years ago and was told she had  hemorrhoids.  She is on a diabetic diet.  She denies any specific food  intolerances such as lactose.  Her appetite is good and her weight is  stable.  She has never had pancreatitis, hepatitis or any other  gastrointestinal problems.   PAST MEDICAL HISTORY:  1. Remarkable for insulin-dependent diabetes with some associated      ophthalmologic problems, but no kidney problems or peripheral      neuropathy.  2. She does have essential hypertension.  3. Hypercholesterolemia.  4. She has been told that she has chronic anemia.  In the past she has      been on iron replacement therapy which she currently takes today.   CURRENT MEDICATIONS:  1. Lipitor 40 mg daily.  2. Glimepiride 4 mg daily.  3. Cartia 300 mg daily.  4. Aspirin 81 mg daily.  5. Slo-Iron daily.  6. Quinapril 40 mg daily.  7. Metoprolol 50 mg daily.  8. Tekturna/hydrochlorothiazide 300/25 mg daily.  9. Bumetanide 1 mg, two daily.  10.Actos 45 mg daily.  11.Januvia 1000 mg daily.  12.Doxazosin 8 mg daily.  13.Lantus insulin 50 units each morning.  14.NovoLog insulin 14, 16 and 14 units  before meals three times daily.   ALLERGIES:  Denied.   FAMILY HISTORY:  Her mother apparently had colon carcinoma at age 5.   SOCIAL HISTORY:  She is married and lives with her husband and three  children.  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  She does not smoke or use ethanol.   REVIEW OF SYSTEMS:  Noncontributory.  Her last menstrual period was in  2004.  She denies any cardiovascular complaints except for vague history  of a mini-stroke years ago.  She apparently denies neurological or  psychiatric difficulties.  The review of systems is otherwise  noncontributory and reviewed.   PHYSICAL EXAMINATION:  GENERAL:  She is an obese-appearing black female,  in no acute distress, appearing her stated age.  VITAL SIGNS:  She is 5 feet 5 inches and weighs 305 pounds.  Blood  pressure 144/66, pulse 66 and regular.  HEENT:  I could not appreciate stigmata of chronic liver disease or  thyromegaly.  CHEST:  Generally clear.  HEART:  She appeared  to be in a regular rhythm without murmurs, gallops  or rubs.  ABDOMEN:  Obese, but I could not appreciate hepatosplenomegaly, masses  or tenderness.  Bowel sounds were normal.  EXTREMITIES:  There was mild peripheral edema and she was wearing TED  stockings.  No evidence of phlebitis or swollen joints.  NEUROLOGIC:  Her mental status was clear.  RECTAL:  Deferred.   ASSESSMENT:  1. Need for screening colonoscopy in a 60 year old black female with a      family history of colon carcinoma, who has never had screening.  2. Vague history of chronic iron deficiency anemia, possibly related      to number one.  3. Rather severe insulin-dependent diabetes mellitus.  4. Hypertensive cardiovascular disease.  5. Exogenous obesity.  6. History of hyperlipidemia.   RECOMMENDATIONS:  1. Outpatient colonoscopy with MoviPrep at her convenience.  2. I will ask Dr. Veverly Fells. Altheimer to help [place] with her insulin       adjustment before her procedure, which will require her to be on      clear liquids for 24 hours before the procedure.  Will try to set      up for a morning colonoscopy appointment, so she can be done and      exit the endoscopy laboratory as soon as possible, and restart her      medications.  3. Continue other multiple medications per Dr. Clent Ridges and Dr. Leslie Dales,      as listed above.     Vania Rea. Jarold Motto, MD, [facility], FAGA  Electronically Signed   DRP/MedQ  DD: 11/20/2007  DT: 11/21/2007  Job #: [id number]   cc:   Tera Mater. Clent Ridges, MD  Veverly Fells. [place], M.D.

## 2011-02-25 NOTE — Op Note (Signed)
[facility]  Patient:    Krista Benjamin, Krista Benjamin Visit Number: [id number] MRN: [id number]          Service Type: DSU Location: [facility] Attending Physician:  Minette Headland Dictated by:   Freddy Finner, M.D. Proc. Date: 11/22/01 Admit Date:  11/22/2001                             Operative Report  PREOPERATIVE DIAGNOSIS:       1. Endometrial polyp.                               2. Intramural leiomyomata.  POSTOPERATIVE DIAGNOSES:      1. Endometrial polyp.                               2. Intramural leiomyomata.  OPERATIVE PROCEDURE:          Hysteroscopy and dilatation and curettage.  SURGEON:                      Freddy Finner, M.D.  INTRAOPERATIVE COMPLICATIONS: None.  INTRAOPERATIVE SORBITOL DEFICIT:                      60 cc.  ESTIMATED INTRAOPERATIVE BLOOD LOSS:                   10 cc.  ANESTHESIA:                   General.  HISTORY OF PRESENT ILLNESS:   The patient is a 60 year old with a history of menorrhagia and no palpable increase on examination in the size of the uterus, but this is compromised by patients body habitus.  Ultrasound showed enlargement of the uterus with at least four fibroids, the largest measuring 3 cm and one small fibroid questionably in a subserosal location.  Fibroids compromised adequate ultrasound study of the endometrial lining, but it was slightly thickened.  DESCRIPTION OF PROCEDURE:     The patient was admitted on the morning of surgery, brought to the operating room, placed under adequate general anesthesia, and in dorsal lithotomy position.  Betadine prep was carried out in the usual fashion.  A bivalve speculum was introduced, the cervix was grasped with a single-tooth tenaculum and progressively dilated to 23 with Pratts.  Using the ACMI 12.5 degree hysteroscope with 3% sorbitol as a distending medium, visualization of the endometrial cavity was accomplished. There was sort of an  undulating endometrial cavity with suggestions of at least three areas, where submucous myomas could be slightly bulging into the cavity.  These were not felt to be reasonably resectable.  There was a definite endometrial polyp.  This was resected with thorough curettage and exploration with polyp forceps.  Additional endometrial tissue was sampled, all material was submitted as a single sample.  Reinspection of the cavity revealed absence of the polyp.  Findings were recorded in still photographs, which were retained in the office record.  The patient was awakened at this point and taken to the recovery room in good condition.  She will be discharged with routine outpatient surgical instructions for follow-up in the office in 7-10 days. Dictated by:   Freddy Finner, M.D. Attending Physician:  Minette Headland DD:  11/22/01 TD:  11/22/01  Job: [id number]

## 2011-05-27 ENCOUNTER — Other Ambulatory Visit: Payer: Self-pay | Admitting: *Deleted

## 2011-05-27 MED ORDER — QUINAPRIL HCL 40 MG PO TABS: 40.0000 mg | ORAL_TABLET | Freq: Every day | ORAL | Status: DC

## 2011-07-07 ENCOUNTER — Encounter: Payer: Self-pay | Admitting: Family Medicine

## 2011-07-07 ENCOUNTER — Ambulatory Visit (INDEPENDENT_AMBULATORY_CARE_PROVIDER_SITE_OTHER): Payer: BC Managed Care – PPO | Admitting: Family Medicine

## 2011-07-07 VITALS — BP 130/80 | HR 91 | Temp 98.1°F | Ht 65.5 in | Wt 325.0 lb

## 2011-07-07 DIAGNOSIS — Z23 Encounter for immunization: Secondary | ICD-10-CM

## 2011-07-07 DIAGNOSIS — Z0000 Encounter for general adult medical examination without abnormal findings: Secondary | ICD-10-CM

## 2011-07-07 NOTE — Progress Notes (Signed)
  Subjective:    Patient ID: Krista Benjamin, female    DOB: October 21, 1950, 60 y.o.   MRN: [id number]  HPI 60 yr old female for a cpx. She is doing well in general. She had complete labs in August per Dr. Leslie Dales, and they all came out good. She does mention some swelling in the legs and feet. She takes 2 mg of Bumex every morning. No SOB.    Review of Systems  Constitutional: Negative.   HENT: Negative.   Eyes: Negative.   Respiratory: Negative.   Cardiovascular: Negative.   Gastrointestinal: Negative.   Genitourinary: Negative for dysuria, urgency, frequency, hematuria, flank pain, decreased urine volume, enuresis, difficulty urinating, pelvic pain and dyspareunia.  Musculoskeletal: Negative.   Skin: Negative.   Neurological: Negative.   Hematological: Negative.   Psychiatric/Behavioral: Negative.        Objective:   Physical Exam  Constitutional: She is oriented to person, place, and time. She appears well-developed and well-nourished. No distress.  HENT:  Head: Normocephalic and atraumatic.  Right Ear: External ear normal.  Left Ear: External ear normal.  Nose: Nose normal.  Mouth/Throat: Oropharynx is clear and moist. No oropharyngeal exudate.  Eyes: Conjunctivae and EOM are normal. Pupils are equal, round, and reactive to light. No scleral icterus.  Neck: Normal range of motion. Neck supple. No JVD present. No thyromegaly present.  Cardiovascular: Normal rate, regular rhythm, normal heart sounds and intact distal pulses.  Exam reveals no gallop and no friction rub.   No murmur heard.      EKG normal  Pulmonary/Chest: Effort normal and breath sounds normal. No respiratory distress. She has no wheezes. She has no rales. She exhibits no tenderness.  Abdominal: Soft. Bowel sounds are normal. She exhibits no distension and no mass. There is no tenderness. There is no rebound and no guarding.  Musculoskeletal: Normal range of motion. She exhibits no edema and no tenderness.    Lymphadenopathy:    She has no cervical adenopathy.  Neurological: She is alert and oriented to person, place, and time. She has normal reflexes. No cranial nerve deficit. She exhibits normal muscle tone. Coordination normal.  Skin: Skin is warm and dry. No rash noted. No erythema.  Psychiatric: She has a normal mood and affect. Her behavior is normal. Judgment and thought content normal.          Assessment & Plan:  We discussed getting exercise and losing weight. Given a flu shot

## 2011-07-07 NOTE — Progress Notes (Signed)
Addended by: Aniceto Boss A on: 07/07/2011 12:04 PM   Modules accepted: Orders

## 2011-09-16 ENCOUNTER — Encounter (INDEPENDENT_AMBULATORY_CARE_PROVIDER_SITE_OTHER): Payer: BC Managed Care – PPO | Admitting: Ophthalmology

## 2011-09-16 DIAGNOSIS — E11319 Type 2 diabetes mellitus with unspecified diabetic retinopathy without macular edema: Secondary | ICD-10-CM

## 2011-09-16 DIAGNOSIS — E1165 Type 2 diabetes mellitus with hyperglycemia: Secondary | ICD-10-CM

## 2011-09-16 DIAGNOSIS — H35039 Hypertensive retinopathy, unspecified eye: Secondary | ICD-10-CM

## 2011-09-16 DIAGNOSIS — E1139 Type 2 diabetes mellitus with other diabetic ophthalmic complication: Secondary | ICD-10-CM

## 2011-09-16 DIAGNOSIS — I10 Essential (primary) hypertension: Secondary | ICD-10-CM

## 2011-12-12 ENCOUNTER — Encounter: Payer: Self-pay | Admitting: Family Medicine

## 2011-12-12 ENCOUNTER — Ambulatory Visit (INDEPENDENT_AMBULATORY_CARE_PROVIDER_SITE_OTHER): Payer: BC Managed Care – PPO | Admitting: Family Medicine

## 2011-12-12 ENCOUNTER — Telehealth: Payer: Self-pay | Admitting: Family Medicine

## 2011-12-12 VITALS — BP 130/76 | HR 67 | Temp 98.6°F

## 2011-12-12 DIAGNOSIS — R062 Wheezing: Secondary | ICD-10-CM

## 2011-12-12 DIAGNOSIS — J40 Bronchitis, not specified as acute or chronic: Secondary | ICD-10-CM

## 2011-12-12 MED ORDER — HYDROCODONE-HOMATROPINE 5-1.5 MG/5ML PO SYRP: 5.0000 mL | ORAL_SOLUTION | ORAL | Status: AC | PRN

## 2011-12-12 MED ORDER — AZITHROMYCIN 250 MG PO TABS: ORAL_TABLET | ORAL | Status: AC

## 2011-12-12 MED ORDER — METHYLPREDNISOLONE ACETATE PF 80 MG/ML IJ SUSP
120.0000 mg | Freq: Once | INTRAMUSCULAR | Status: AC
  Administered 2011-12-12: 120 mg via INTRAMUSCULAR

## 2011-12-12 MED ORDER — PREDNISONE (PAK) 10 MG PO TABS: 10.0000 mg | ORAL_TABLET | Freq: Every day | ORAL | Status: AC

## 2011-12-12 MED ORDER — IPRATROPIUM-ALBUTEROL 0.5-2.5 (3) MG/3ML IN SOLN
3.0000 mL | Freq: Once | RESPIRATORY_TRACT | Status: AC
  Administered 2011-12-12: 3 mL via RESPIRATORY_TRACT

## 2011-12-12 NOTE — Telephone Encounter (Signed)
noted 

## 2011-12-12 NOTE — Progress Notes (Signed)
Addended by: Aniceto Boss A on: 12/12/2011 02:00 PM   Modules accepted: Orders

## 2011-12-12 NOTE — Telephone Encounter (Signed)
Pt called and said that she is taking a White HFA. It says 200 metered Inhalations. Pt wanted to make Dr Clent Ridges aware.

## 2011-12-12 NOTE — Progress Notes (Signed)
  Subjective:    Patient ID: Krista Benjamin, female    DOB: 28-Dec-1950, 61 y.o.   MRN: [id number]  HPI Here for 5 days of chest congestion, coughing up green sputum, and wheezing. She had a fever at first but not now. Using her Proair inhaler.    Review of Systems  Constitutional: Negative.   HENT: Negative.   Eyes: Negative.   Respiratory: Positive for cough, chest tightness and wheezing.        Objective:   Physical Exam  Constitutional: She appears well-developed and well-nourished.  HENT:  Right Ear: External ear normal.  Left Ear: External ear normal.  Nose: Nose normal.  Mouth/Throat: Oropharynx is clear and moist. No oropharyngeal exudate.  Eyes: Conjunctivae are normal.  Neck: No thyromegaly present.  Pulmonary/Chest: She has no rales.       Diffuse wheezing and rhonchi   Lymphadenopathy:    She has no cervical adenopathy.          Assessment & Plan:  Given a steroid shot today to be followed by a oral taper. Get on a Zpack. Recheck prn

## 2011-12-13 ENCOUNTER — Telehealth: Payer: Self-pay | Admitting: Family Medicine

## 2011-12-13 NOTE — Telephone Encounter (Signed)
Pt saw you yesterday. When she filled the Rx for the bronchitis at the pharmacy, they told her that they couldn't tell her how to take the med, because the SIG was not clear. Please clarify SIG and have Nettie Elm call pt ASAP so she can start taking the medication. Thanks!

## 2011-12-13 NOTE — Telephone Encounter (Signed)
We already spoke to her and got this straightened out

## 2012-03-22 ENCOUNTER — Encounter (HOSPITAL_COMMUNITY): Payer: Self-pay | Admitting: Emergency Medicine

## 2012-03-22 ENCOUNTER — Emergency Department (HOSPITAL_COMMUNITY)
Admission: EM | Admit: 2012-03-22 | Discharge: 2012-03-22 | Disposition: A | Discharge status: Home / Self Care | Payer: No Typology Code available for payment source | Attending: Emergency Medicine | Admitting: Emergency Medicine

## 2012-03-22 ENCOUNTER — Emergency Department (HOSPITAL_COMMUNITY): Payer: No Typology Code available for payment source

## 2012-03-22 DIAGNOSIS — S161XXA Strain of muscle, fascia and tendon at neck level, initial encounter: Secondary | ICD-10-CM

## 2012-03-22 DIAGNOSIS — T148XXA Other injury of unspecified body region, initial encounter: Secondary | ICD-10-CM

## 2012-03-22 DIAGNOSIS — Y92410 Unspecified street and highway as the place of occurrence of the external cause: Secondary | ICD-10-CM | POA: Insufficient documentation

## 2012-03-22 DIAGNOSIS — S139XXA Sprain of joints and ligaments of unspecified parts of neck, initial encounter: Secondary | ICD-10-CM | POA: Insufficient documentation

## 2012-03-22 DIAGNOSIS — Z7982 Long term (current) use of aspirin: Secondary | ICD-10-CM | POA: Insufficient documentation

## 2012-03-22 DIAGNOSIS — R51 Headache: Secondary | ICD-10-CM | POA: Insufficient documentation

## 2012-03-22 DIAGNOSIS — I10 Essential (primary) hypertension: Secondary | ICD-10-CM | POA: Insufficient documentation

## 2012-03-22 DIAGNOSIS — M79609 Pain in unspecified limb: Secondary | ICD-10-CM | POA: Insufficient documentation

## 2012-03-22 DIAGNOSIS — E119 Type 2 diabetes mellitus without complications: Secondary | ICD-10-CM | POA: Insufficient documentation

## 2012-03-22 DIAGNOSIS — Z79899 Other long term (current) drug therapy: Secondary | ICD-10-CM | POA: Insufficient documentation

## 2012-03-22 DIAGNOSIS — E785 Hyperlipidemia, unspecified: Secondary | ICD-10-CM | POA: Insufficient documentation
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
## 2012-03-22 NOTE — ED Notes (Signed)
Pt alert and oriented, with steady gait at time of discharge. Pt given discharge papers and papers explained. All questions answered and pt wheeled to discharge.  
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
## 2012-03-23 ENCOUNTER — Ambulatory Visit (INDEPENDENT_AMBULATORY_CARE_PROVIDER_SITE_OTHER): Payer: BC Managed Care – PPO | Admitting: Family Medicine

## 2012-03-23 ENCOUNTER — Encounter: Payer: Self-pay | Admitting: Family Medicine

## 2012-03-23 VITALS — BP 128/62 | HR 72 | Temp 98.3°F

## 2012-03-23 DIAGNOSIS — S161XXA Strain of muscle, fascia and tendon at neck level, initial encounter: Secondary | ICD-10-CM

## 2012-03-23 DIAGNOSIS — S139XXA Sprain of joints and ligaments of unspecified parts of neck, initial encounter: Secondary | ICD-10-CM

## 2012-03-23 MED ORDER — DICLOFENAC SODIUM 75 MG PO TBEC: 75.0000 mg | DELAYED_RELEASE_TABLET | Freq: Two times a day (BID) | ORAL | Status: DC | PRN

## 2012-03-23 MED ORDER — CYCLOBENZAPRINE HCL 10 MG PO TABS: 10.0000 mg | ORAL_TABLET | Freq: Three times a day (TID) | ORAL | Status: AC | PRN

## 2012-03-23 NOTE — ED Provider Notes (Signed)
Medical screening examination/treatment/procedure(s) were performed by non-physician practitioner and as supervising physician I was immediately available for consultation/collaboration.   Saige Busby R Travell Desaulniers, MD 03/23/12 2109 
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
## 2012-04-10 ENCOUNTER — Telehealth: Payer: Self-pay | Admitting: Family Medicine

## 2012-04-10 NOTE — Telephone Encounter (Signed)
Forward (faxed) 8 pages to Dr. Gershon Crane on 04-10-12 ym

## 2012-04-24 ENCOUNTER — Ambulatory Visit (INDEPENDENT_AMBULATORY_CARE_PROVIDER_SITE_OTHER): Payer: BC Managed Care – PPO | Admitting: Cardiology

## 2012-04-24 ENCOUNTER — Encounter: Payer: Self-pay | Admitting: Cardiology

## 2012-04-24 VITALS — BP 140/75 | HR 69 | Ht 66.0 in | Wt 319.1 lb

## 2012-04-24 DIAGNOSIS — E785 Hyperlipidemia, unspecified: Secondary | ICD-10-CM

## 2012-04-24 DIAGNOSIS — I10 Essential (primary) hypertension: Secondary | ICD-10-CM

## 2012-04-24 DIAGNOSIS — R079 Chest pain, unspecified: Secondary | ICD-10-CM

## 2012-04-24 DIAGNOSIS — R0989 Other specified symptoms and signs involving the circulatory and respiratory systems: Secondary | ICD-10-CM

## 2012-04-24 DIAGNOSIS — R0609 Other forms of dyspnea: Secondary | ICD-10-CM

## 2012-04-24 DIAGNOSIS — I359 Nonrheumatic aortic valve disorder, unspecified: Secondary | ICD-10-CM

## 2012-04-24 DIAGNOSIS — R0683 Snoring: Secondary | ICD-10-CM

## 2012-04-24 DIAGNOSIS — E669 Obesity, unspecified: Secondary | ICD-10-CM
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
## 2012-04-24 NOTE — Patient Instructions (Addendum)
The current medical regimen is effective;  continue present plan and medications.  Your physician has recommended that you have a sleep study. This test records several body functions during sleep, including: brain activity, eye movement, oxygen and carbon dioxide blood levels, heart rate and rhythm, breathing rate and rhythm, the flow of air through your mouth and nose, snoring, body muscle movements, and chest and belly movement.  Follow up in 2 years with Dr Antoine Poche.  You will receive a letter in the mail 2 months before you are due.  Please call us when you receive this letter to schedule your follow up appointment.

## 2012-04-25 ENCOUNTER — Ambulatory Visit (HOSPITAL_BASED_OUTPATIENT_CLINIC_OR_DEPARTMENT_OTHER): Payer: BC Managed Care – PPO | Attending: Cardiology | Admitting: Radiology

## 2012-04-25 VITALS — Ht 66.0 in | Wt 313.0 lb

## 2012-04-25 DIAGNOSIS — R0683 Snoring: Secondary | ICD-10-CM

## 2012-04-25 DIAGNOSIS — G4733 Obstructive sleep apnea (adult) (pediatric): Secondary | ICD-10-CM

## 2012-04-28 DIAGNOSIS — G4733 Obstructive sleep apnea (adult) (pediatric): Secondary | ICD-10-CM

## 2012-04-29 NOTE — Procedures (Signed)
NAME:  Krista Benjamin, Krista Benjamin         ACCOUNT NO.:  [id number]  MEDICAL RECORD NO.:  [id number]          PATIENT TYPE:  OUT  LOCATION:  SLEEP CENTER                 FACILITY:  [facility]  PHYSICIAN:  Barbaraann Share, MD,FCCPDATE OF BIRTH:  06/29/50  DATE OF STUDY:  04/25/2012                           NOCTURNAL POLYSOMNOGRAM  REFERRING PHYSICIAN:  Rollene Rotunda, MD, [facility]  LOCATION:  Sleep Lab.  INDICATION FOR STUDY:  Hypersomnia with sleep apnea.  EPWORTH SLEEPINESS SCORE:  19.  MEDICATIONS:  SLEEP ARCHITECTURE:  The patient had a total sleep time of only 201 minutes with very little slow-wave sleep and only 39 minutes of REM. Sleep onset latency was prolonged at 93 minutes, and REM onset was normal at 95 minutes.  Sleep efficiency was poor at 54%.  RESPIRATORY DATA:  The patient was found to have 8 apneas and 26 obstructive hypopneas for an apnea-hypopnea index of 10 events per hour. The events were not positional, but were more pronounced during REM. Loud snoring was noted throughout.  The patient did not meet split night criteria secondary to her small numbers of events.  OXYGEN DATA:  There was O2 desaturation as low as 86% with the patient's obstructive events.  CARDIAC DATA:  No clinically significant arrhythmias were noted.  MOVEMENTS-PARASOMNIA:  The patient had no significant leg jerks or other abnormal behaviors noted.  IMPRESSIONS-RECOMMENDATIONS:  Mild obstructive sleep apnea/hypopnea syndrome with an AHI of 10 events per hour and oxygen desaturation as low as 86%.  Treatment for this degree of sleep apnea can include a trial of weight loss alone, upper airway surgery, dental appliance, and also CPAP.  Clinical correlation is suggested.    Barbaraann Share, MD,FCCP Diplomate, American Board of Sleep Medicine   [facility]MED[facility]  D:  04/28/2012 18:20:09  T:  04/29/2012 02:40:24  Job:  [id number]

## 2012-05-15 ENCOUNTER — Telehealth: Payer: Self-pay | Admitting: Cardiology

## 2012-05-15 NOTE — Telephone Encounter (Signed)
Will have Dr Antoine Poche review sleep study results.

## 2012-05-15 NOTE — Telephone Encounter (Signed)
Pt would like results of sleep study

## 2012-05-17 NOTE — Telephone Encounter (Signed)
Pt aware of results and was very grateful

## 2012-05-17 NOTE — Telephone Encounter (Signed)
Dr Antoine Poche reviewed sleep study. It reveals that the pt has only mild sleep apnea that does not require treatment with CPAP

## 2012-07-13 ENCOUNTER — Other Ambulatory Visit: Payer: Self-pay | Admitting: Cardiology

## 2012-07-16 NOTE — Telephone Encounter (Signed)
..   Requested Prescriptions   Pending Prescriptions Disp Refills  . quinapril (ACCUPRIL) 40 MG tablet [Pharmacy Med Name: QUINAPRIL 40 MG TABLET] 30 tablet 6    Sig: TAKE 1 TABLET BY MOUTH EVERY DAY

## 2012-08-03 ENCOUNTER — Encounter: Payer: Self-pay | Admitting: Family Medicine

## 2012-08-03 ENCOUNTER — Ambulatory Visit (INDEPENDENT_AMBULATORY_CARE_PROVIDER_SITE_OTHER): Payer: BC Managed Care – PPO | Admitting: Family Medicine

## 2012-08-03 VITALS — BP 146/80 | Wt 316.0 lb

## 2012-08-03 DIAGNOSIS — L0291 Cutaneous abscess, unspecified: Secondary | ICD-10-CM

## 2012-08-03 DIAGNOSIS — L0390 Cellulitis, unspecified: Secondary | ICD-10-CM

## 2012-08-03 MED ORDER — DOXYCYCLINE HYCLATE 100 MG PO CAPS: 100.0000 mg | ORAL_CAPSULE | Freq: Two times a day (BID) | ORAL | Status: AC

## 2012-08-03 MED ORDER — LEVOFLOXACIN 500 MG PO TABS: 500.0000 mg | ORAL_TABLET | Freq: Every day | ORAL | Status: AC

## 2012-08-03 NOTE — Progress Notes (Signed)
  Subjective:    Patient ID: Krista Benjamin, female    DOB: 21-Sep-1951, 61 y.o.   MRN: [id number]  HPI Here for pain around a wound on the right lower leg which occurred 4 weeks ago when she fell off an elliptical machine. She saw a [facility] a few days after that, and they simply told her to put Bacitracin on it. The broken skin closed up well, but for the past week the area around it has turned dark and is painful. No fevers.    Review of Systems  Constitutional: Negative.   Skin: Positive for wound.       Objective:   Physical Exam  Constitutional: She appears well-developed and well-nourished.  Skin:       The right shin has a large area of macular erythema which is warm, slightly puffy and tender. The original wound site is closed.           Assessment & Plan:  She has cellulitis, and with her diabetes and lower extremity edema we need to treat this aggressively. Start on Levaquin and Doxycycline for 14 days

## 2012-09-17 ENCOUNTER — Ambulatory Visit (INDEPENDENT_AMBULATORY_CARE_PROVIDER_SITE_OTHER): Payer: BC Managed Care – PPO | Admitting: Ophthalmology

## 2012-09-17 DIAGNOSIS — H35039 Hypertensive retinopathy, unspecified eye: Secondary | ICD-10-CM

## 2012-09-17 DIAGNOSIS — E1139 Type 2 diabetes mellitus with other diabetic ophthalmic complication: Secondary | ICD-10-CM

## 2012-09-17 DIAGNOSIS — H2510 Age-related nuclear cataract, unspecified eye: Secondary | ICD-10-CM

## 2012-09-17 DIAGNOSIS — E11319 Type 2 diabetes mellitus with unspecified diabetic retinopathy without macular edema: Secondary | ICD-10-CM

## 2012-09-17 DIAGNOSIS — H43819 Vitreous degeneration, unspecified eye: Secondary | ICD-10-CM

## 2012-09-17 DIAGNOSIS — E1165 Type 2 diabetes mellitus with hyperglycemia: Secondary | ICD-10-CM

## 2012-09-17 DIAGNOSIS — I10 Essential (primary) hypertension: Secondary | ICD-10-CM

## 2012-10-12 ENCOUNTER — Telehealth: Payer: Self-pay | Admitting: Family Medicine

## 2012-10-12 NOTE — Telephone Encounter (Signed)
Only  if the Orthopedist requires me to clear her. If so, she will need to set up an appt for this

## 2012-10-12 NOTE — Telephone Encounter (Signed)
Patient found out she needs to have surgery on her knee and she wants to know if she needs to be seen by Dr. Clent Ridges before her surgery, please advise

## 2012-10-15 NOTE — Telephone Encounter (Signed)
Can you call pt and give the below information and make appointment?

## 2012-10-16 NOTE — Telephone Encounter (Signed)
appt scheduled

## 2012-10-17 ENCOUNTER — Encounter: Payer: Self-pay | Admitting: Family Medicine

## 2012-10-17 ENCOUNTER — Ambulatory Visit (INDEPENDENT_AMBULATORY_CARE_PROVIDER_SITE_OTHER): Payer: BC Managed Care – PPO | Admitting: Family Medicine

## 2012-10-17 VITALS — BP 138/76 | HR 82 | Temp 98.5°F | Wt 312.0 lb

## 2012-10-17 DIAGNOSIS — I10 Essential (primary) hypertension: Secondary | ICD-10-CM

## 2012-10-17 DIAGNOSIS — E119 Type 2 diabetes mellitus without complications: Secondary | ICD-10-CM

## 2012-10-17 DIAGNOSIS — L0291 Cutaneous abscess, unspecified: Secondary | ICD-10-CM

## 2012-10-17 DIAGNOSIS — L0390 Cellulitis, unspecified: Secondary | ICD-10-CM

## 2012-10-17 DIAGNOSIS — M25569 Pain in unspecified knee: Secondary | ICD-10-CM

## 2012-10-17 MED ORDER — LEVOFLOXACIN 500 MG PO TABS: 500.0000 mg | ORAL_TABLET | Freq: Every day | ORAL | Status: AC

## 2012-10-17 MED ORDER — DOXYCYCLINE HYCLATE 100 MG PO CAPS: 100.0000 mg | ORAL_CAPSULE | Freq: Two times a day (BID) | ORAL | Status: AC

## 2012-10-17 NOTE — Progress Notes (Signed)
  Subjective:    Patient ID: Krista Benjamin, female    DOB: 03-Nov-1950, 62 y.o.   MRN: [id number]  HPI Here for a presurgical evaluation. She fell off her treadmill in September and injured the right knee. She has been seeing a Land by the name of Clide Dales DC, and he ordered an MRI of the knee recently. This showed a large meniscal tear, so she went to see an Orthopedist. She is getting some PT but is not responding well, so they are contemplating surgery of some type. In general she had been doing well. Her HTN is stable, and she had a good visit with Dr. Antoine Poche last fall. Her EKG that day was fine. She had a good visit with Dr. Leslie Dales recently, and her A1c was excellent at 6.1. However she has been having some pain in the right lower leg for the past week. She had sustained some abrasions to the leg with the fall lat Septemeber, and she developed some cellulitis at that time. We treated her with levaquin and doxycycline, and she seemed to clear up. She denies any recent fevers.    Review of Systems  Constitutional: Negative.   Respiratory: Negative.   Cardiovascular: Negative.   Skin: Positive for color change.       Objective:   Physical Exam  Constitutional: She appears well-developed and well-nourished.  Cardiovascular: Normal rate, regular rhythm, normal heart sounds and intact distal pulses.   Pulmonary/Chest: Effort normal and breath sounds normal.  Skin:       The right lower leg is swollen, warm, slightly pink, and tender along the shin. The original abrasions have healed over          Assessment & Plan:  In general she seems to be doing well from a cardiovascular and pulmonary perspective, and she should have very low perioperative risk. However the cellulitis on the leg needs to be cleared up before she could ever go into surgery. Treat with Levaquin and Doxycyline and recheck here in 2 weeks.

## 2012-11-07 ENCOUNTER — Ambulatory Visit (INDEPENDENT_AMBULATORY_CARE_PROVIDER_SITE_OTHER): Payer: BC Managed Care – PPO | Admitting: Family Medicine

## 2012-11-07 ENCOUNTER — Encounter: Payer: Self-pay | Admitting: Family Medicine

## 2012-11-07 VITALS — BP 130/76 | HR 72 | Temp 97.9°F | Wt 307.0 lb

## 2012-11-07 DIAGNOSIS — L02419 Cutaneous abscess of limb, unspecified: Secondary | ICD-10-CM

## 2012-11-07 DIAGNOSIS — L03119 Cellulitis of unspecified part of limb: Secondary | ICD-10-CM

## 2012-11-07 NOTE — Progress Notes (Signed)
  Subjective:    Patient ID: Krista Benjamin, female    DOB: 1951-07-28, 62 y.o.   MRN: [id number]  HPI Here to recheck cellulitis on the right lower leg. We saw her 3 weeks ago for this and gave her courses of Doxycycline and Levaquin. She finished these 4 days ago and she feels much better. No more leg pain other than the knee pain.    Review of Systems  Constitutional: Negative.   Musculoskeletal: Positive for arthralgias.       Objective:   Physical Exam  Constitutional: She appears well-developed and well-nourished.  Musculoskeletal:       The right lower leg looks great. It is not warm or red or tender.           Assessment & Plan:  The cellulitis has resolved. I have cleared her for surgery and I think the tentative date is for one week from today

## 2012-12-15 ENCOUNTER — Encounter (HOSPITAL_COMMUNITY): Payer: Self-pay | Admitting: Emergency Medicine

## 2012-12-15 ENCOUNTER — Emergency Department (HOSPITAL_COMMUNITY)
Admission: EM | Admit: 2012-12-15 | Discharge: 2012-12-16 | Disposition: A | Discharge status: Home / Self Care | Payer: BC Managed Care – PPO | Attending: Emergency Medicine | Admitting: Emergency Medicine

## 2012-12-15 DIAGNOSIS — Z9889 Other specified postprocedural states: Secondary | ICD-10-CM | POA: Insufficient documentation

## 2012-12-15 DIAGNOSIS — M25561 Pain in right knee: Secondary | ICD-10-CM

## 2012-12-15 DIAGNOSIS — E119 Type 2 diabetes mellitus without complications: Secondary | ICD-10-CM | POA: Insufficient documentation

## 2012-12-15 DIAGNOSIS — Z794 Long term (current) use of insulin: Secondary | ICD-10-CM | POA: Insufficient documentation

## 2012-12-15 DIAGNOSIS — Z79899 Other long term (current) drug therapy: Secondary | ICD-10-CM | POA: Insufficient documentation

## 2012-12-15 DIAGNOSIS — G8918 Other acute postprocedural pain: Secondary | ICD-10-CM | POA: Insufficient documentation

## 2012-12-15 DIAGNOSIS — Z7982 Long term (current) use of aspirin: Secondary | ICD-10-CM | POA: Insufficient documentation

## 2012-12-15 DIAGNOSIS — Z862 Personal history of diseases of the blood and blood-forming organs and certain disorders involving the immune mechanism: Secondary | ICD-10-CM | POA: Insufficient documentation

## 2012-12-15 DIAGNOSIS — I10 Essential (primary) hypertension: Secondary | ICD-10-CM | POA: Insufficient documentation

## 2012-12-15 DIAGNOSIS — E785 Hyperlipidemia, unspecified: Secondary | ICD-10-CM | POA: Insufficient documentation

## 2012-12-15 DIAGNOSIS — Z8669 Personal history of other diseases of the nervous system and sense organs: Secondary | ICD-10-CM | POA: Insufficient documentation

## 2012-12-15 MED ORDER — NAPROXEN 500 MG PO TABS: 500.0000 mg | ORAL_TABLET | Freq: Two times a day (BID) | ORAL | Status: DC

## 2012-12-15 MED ORDER — KETOROLAC TROMETHAMINE 60 MG/2ML IM SOLN
60.0000 mg | Freq: Once | INTRAMUSCULAR | Status: AC
  Administered 2012-12-15: 60 mg via INTRAMUSCULAR
  Filled 2012-12-15: qty 2

## 2012-12-15 MED ORDER — HYDROCODONE-ACETAMINOPHEN 5-325 MG PO TABS: 1.0000 | ORAL_TABLET | ORAL | Status: DC | PRN

## 2012-12-15 NOTE — ED Notes (Signed)
Patient has right knee surgery 10 days ago.  Patient called Doctor for more pain medication because patient ran out.  [facility] Patient pain currently 9/10 sharp.

## 2012-12-15 NOTE — ED Provider Notes (Signed)
History    This chart was scribed for non-physician practitioner working with Rolan Bucco, MD by Gerlean Ren, ED Scribe. This patient was seen in room TR04C/TR04C and the patient's care was started at 10:40 PM.    CSN: [id number]  Arrival date & time 12/15/12  1647   First MD Initiated Contact with Patient 12/15/12 2239      Chief Complaint  Patient presents with  . Knee Pain  . Medication Refill     The history is provided by the patient. No language interpreter was used.  Krista Benjamin is a 62 y.o. female with h/o DM, HTN, and anemia who presents to the Emergency Department complaining of constant ongoing sharp right knee pain rated as 9/10 associated with arthroscopic surgery to repair meniscus and improve arthritic degeneration 02/26 for which pt has oxycodone prescription but states she does not want anymore because of the way it makes her feel.  Pt has follow-up with orthopedist 03/14.  Pt has never taken hydrocodone.  No blood thinners.  No prior stomach ulcers.    Past Medical History  Diagnosis Date  . Hyperlipidemia   . Anemia   . Cataract   . Diabetes mellitus     sees Dr. Casimiro Needle Altheimer  . Hypertension     sees Dr. Angelina Sheriff  . Gynecological examination     sees Dr. Jennette Kettle     Past Surgical History  Procedure Laterality Date  . Excision of benign cyst      from left maxilla and left maxillary sinus  . Colonoscopy  11-26-07    per Dr. Jarold Motto, repeat in 10 yrs   . Knee surgery      Family History  Problem Relation Age of Onset  . Coronary artery disease Mother   . Coronary artery disease Brother   . Cancer      fhx  . Diabetes      fhx  . Hyperlipidemia      fhx  . Hypertension      fhx  . Stroke      fhx    History  Substance Use Topics  . Smoking status: Never Smoker   . Smokeless tobacco: Never Used  . Alcohol Use: No    No OB history provided.   Review of Systems  Musculoskeletal: Positive for arthralgias (right knee).   All other systems reviewed and are negative.    Allergies  Review of patient's allergies indicates no known allergies.  Home Medications   Current Outpatient Rx  Name  Route  Sig  Dispense  Refill  . aspirin 81 MG tablet   Oral   Take 81 mg by mouth at bedtime.          Marland Kitchen atorvastatin (LIPITOR) 40 MG tablet   Oral   Take 40 mg by mouth at bedtime.          . cholecalciferol (VITAMIN D) 1000 UNITS tablet   Oral   Take 2,000 Units by mouth at bedtime.          Marland Kitchen diltiazem (CARDIZEM CD) 300 MG 24 hr capsule   Oral   Take 300 mg by mouth at bedtime.          Marland Kitchen doxazosin (CARDURA) 8 MG tablet   Oral   Take 8 mg by mouth at bedtime.           Marland Kitchen glimepiride (AMARYL) 4 MG tablet   Oral   Take 2 mg by mouth daily before  breakfast. 1/2 tab daily         . insulin aspart (NOVOLOG) 100 UNIT/ML injection   Subcutaneous   Inject 8 Units into the skin as needed for high blood sugar. Before lunch         . insulin glargine (LANTUS) 100 UNIT/ML injection   Subcutaneous   Inject 10 Units into the skin at bedtime.           . iron polysaccharides (NIFEREX) 150 MG capsule   Oral   Take 150 mg by mouth at bedtime.          Marland Kitchen linagliptin (TRADJENTA) 5 MG TABS tablet   Oral   Take 5 mg by mouth every morning.         . metoprolol (LOPRESSOR) 50 MG tablet   Oral   Take 25 mg by mouth 2 (two) times daily. 1/2 in am 1/2 in pm         . oxycodone (OXY-IR) 5 MG capsule   Oral   Take 5-10 mg by mouth every 4 (four) hours as needed for pain.          . pioglitazone (ACTOS) 45 MG tablet   Oral   Take 45 mg by mouth at bedtime.          . quinapril (ACCUPRIL) 40 MG tablet   Oral   Take 20 mg by mouth at bedtime. Take 1/2         . bumetanide (BUMEX) 1 MG tablet   Oral   Take 2 mg by mouth at bedtime.          Marland Kitchen HYDROcodone-acetaminophen (NORCO/VICODIN) 5-325 MG per tablet   Oral   Take 1 tablet by mouth every 4 (four) hours as needed for  pain.   10 tablet   0   . naproxen (NAPROSYN) 500 MG tablet   Oral   Take 1 tablet (500 mg total) by mouth 2 (two) times daily with a meal.   30 tablet   0     BP 150/58  Pulse 76  Temp(Src) 98.9 F (37.2 C) (Oral)  Resp 22  SpO2 96%  Physical Exam  Nursing note and vitals reviewed. Constitutional: She is oriented to person, place, and time. She appears well-developed and well-nourished.  HENT:  Head: Normocephalic.  Mouth/Throat: Oropharynx is clear and moist.  Eyes: Conjunctivae and EOM are normal. Pupils are equal, round, and reactive to light.  Neck: Normal range of motion. No tracheal deviation present.  Cardiovascular: Normal rate, regular rhythm, normal heart sounds and intact distal pulses.  Exam reveals no gallop and no friction rub.   No murmur heard. Good distal pulses  Pulmonary/Chest: Effort normal and breath sounds normal. No respiratory distress. She has no wheezes. She has no rales.  Musculoskeletal: She exhibits edema and tenderness.       Right knee: She exhibits decreased range of motion (2/2 pain) and swelling. Tenderness found. Medial joint line and lateral joint line tenderness noted.  Neurological: She is alert and oriented to person, place, and time.  Skin: Skin is warm. No rash noted. No erythema.  2 well-healed surgical incisions to right knee from arthroscopic surgery  Psychiatric: She has a normal mood and affect. Her behavior is normal.    ED Course  Procedures (including critical care time) DIAGNOSTIC STUDIES: Oxygen Saturation is 96% on room air, adequate by my interpretation.    COORDINATION OF CARE: 10:51 PM- Discussed with pt naprosyn prescription to be  taken with food and Vicodin prescription to be taken if pain becomes unbearable and warned pt that this may also cause pt to "feel funny."  Offered pt IM anti-inflammatories here and pt accepted.  Advised elevation and ice at home.  Pt understands and agrees with plan.     1. Knee  meniscus pain, right   2. S/P arthroscopy of knee       MDM  [name] percent status post knee arthroscopy with persistent pain. Patient unable to have medication refilled by her orthopedist as it was the weekend and they referred her here to the emergency department. I discussed pain treatment options and we have settled on a person with Vicodin as breakthrough pain control. She is to follow up this week with her orthopedist as scheduled for Friday.  No evidence of cellulitis or subcutaneous joint. Patient alert, oriented, afebrile, non-tachycardic nonseptic, nontoxic appearing.  Pain managed in ED. Pt advised to follow up with orthopedics. Patient will be dc home & is agreeable with above plan.  1. Medications:  naproxyn, vicodin, usual home medications 2. Treatment: rest, drink plenty of fluids, elevate leg, compress and use ice 3. Follow Up: Please followup with your primary doctor for discussion of your diagnoses and further evaluation after today's visit; if you do not have a primary care doctor use the resource guide provided to find one; f/u with orthopedics as discussed  I personally performed the services described in this documentation, which was scribed in my presence. The recorded information has been reviewed and is accurate.        Dahlia Client Muthersbaugh, PA-C 12/15/12 2325

## 2012-12-15 NOTE — ED Notes (Addendum)
Pt reports she had sx on her right knee on 12/05/12 to repair her mensicus. She was told they prescriptions would be called into the pharmacy but when she went to pick them up they said they didn't have them and she called the sx center and they told her they didn't call in prescriptions on the weekends. Pt in wheelchair with foot elevated. Pt in nad, skin warm and dry, resp e/u. Pt reports she has been taking oxycodone at home, last dose at 9am on Friday.

## 2012-12-15 NOTE — ED Provider Notes (Signed)
Medical screening examination/treatment/procedure(s) were performed by non-physician practitioner and as supervising physician I was immediately available for consultation/collaboration.   Rolan Bucco, MD 12/15/12 [phone]

## 2012-12-27 ENCOUNTER — Ambulatory Visit (INDEPENDENT_AMBULATORY_CARE_PROVIDER_SITE_OTHER): Payer: BC Managed Care – PPO | Admitting: Family Medicine

## 2012-12-27 ENCOUNTER — Encounter: Payer: Self-pay | Admitting: Family Medicine

## 2012-12-27 VITALS — BP 180/84 | HR 64 | Temp 98.5°F

## 2012-12-27 DIAGNOSIS — Z23 Encounter for immunization: Secondary | ICD-10-CM

## 2012-12-27 DIAGNOSIS — B372 Candidiasis of skin and nail: Secondary | ICD-10-CM

## 2012-12-27 DIAGNOSIS — Z2911 Encounter for prophylactic immunotherapy for respiratory syncytial virus (RSV): Secondary | ICD-10-CM

## 2012-12-27 MED ORDER — FLUCONAZOLE 200 MG PO TABS: 200.0000 mg | ORAL_TABLET | Freq: Two times a day (BID) | ORAL | Status: DC

## 2012-12-27 NOTE — Addendum Note (Signed)
Addended by: Aniceto Boss A on: 12/27/2012 12:52 PM   Modules accepted: Orders

## 2012-12-27 NOTE — Progress Notes (Signed)
  Subjective:    Patient ID: Krista Benjamin, female    DOB: 1951-04-19, 62 y.o.   MRN: [id number]  HPI Here for 2 weeks of an itchy rash under her abdomen and into both groins. She is getting PT to help rehab from her recent knee surgery.   Review of Systems  Constitutional: Negative.   Skin: Positive for rash.       Objective:   Physical Exam  Constitutional: She appears well-developed and well-nourished.  Skin:  Widespread macular erythematous rash under her large pannus           Assessment & Plan:  Treat with Diflucan. She will keep the area as dry as possible.

## 2013-03-11 ENCOUNTER — Telehealth: Payer: Self-pay | Admitting: Family Medicine

## 2013-03-11 MED ORDER — ALBUTEROL SULFATE HFA 108 (90 BASE) MCG/ACT IN AERS: 2.0000 | INHALATION_SPRAY | RESPIRATORY_TRACT | Status: DC | PRN

## 2013-03-11 NOTE — Telephone Encounter (Signed)
Pt states her pharmacy told her they've tried 2 tiems to get her inhaler refilled, though I see nothing in chart. Pt requesting refill on her albuterol (PROVENTIL HFA;VENTOLIN HFA) 108 (90 BASE) MCG/ACT inhaler  [facility].

## 2013-03-11 NOTE — Telephone Encounter (Signed)
I sent script e-scribe. 

## 2013-03-12 ENCOUNTER — Telehealth: Payer: Self-pay | Admitting: Family Medicine

## 2013-03-12 MED ORDER — HYDROCODONE-HOMATROPINE 5-1.5 MG/5ML PO SYRP: 5.0000 mL | ORAL_SOLUTION | ORAL | Status: DC | PRN

## 2013-03-12 NOTE — Telephone Encounter (Signed)
Given to the patient in the office

## 2013-03-16 ENCOUNTER — Ambulatory Visit (INDEPENDENT_AMBULATORY_CARE_PROVIDER_SITE_OTHER): Payer: BC Managed Care – PPO | Admitting: Family Medicine

## 2013-03-16 ENCOUNTER — Encounter: Payer: Self-pay | Admitting: Family Medicine

## 2013-03-16 VITALS — BP 140/80 | HR 65 | Temp 98.3°F | Wt 298.0 lb

## 2013-03-16 DIAGNOSIS — J209 Acute bronchitis, unspecified: Secondary | ICD-10-CM

## 2013-03-16 DIAGNOSIS — J40 Bronchitis, not specified as acute or chronic: Secondary | ICD-10-CM

## 2013-03-16 MED ORDER — FLUCONAZOLE 150 MG PO TABS: 150.0000 mg | ORAL_TABLET | Freq: Once | ORAL | Status: DC

## 2013-03-16 MED ORDER — AZITHROMYCIN 250 MG PO TABS: ORAL_TABLET | ORAL | Status: DC

## 2013-03-16 NOTE — Patient Instructions (Addendum)
Take the zpack for bronchitis Use the albuterol inhaler as needed for wheeze - if wheezing worsens let us know  If you get a yeast infection-take the diflucan pill with food  (hold your atorvastatin that day)  Update if not starting to improve in a week or if worsening

## 2013-03-16 NOTE — Progress Notes (Signed)
Subjective:    Patient ID: Krista Benjamin, female    DOB: 11/18/1950, 62 y.o.   MRN: [id number]  HPI Here with cough and uri symptoms  Symptoms started with sneezing and cough on Tuesday  Dr Clent Ridges refilled an old hydrocodone cough medicine mid week  There is phlegm but cannot spit it out  Wheezes some  Tends to get wheezing with bronchitis-no asthma occ a bit dizzy Used some albuterol  Was a bit nauseated   phlem is loosening   No otc medicine  Patient Active Problem List   Diagnosis Date Noted  . AORTIC VALVE DISORDERS 04/26/2010  . OBESITY, UNSPECIFIED 05/07/2009  . MURMUR 05/07/2009  . CHEST PAIN 05/07/2009  . LOW BACK PAIN 01/09/2008  . ANEMIA-IRON DEFICIENCY 11/21/2007  . WEAKNESS 11/21/2007  . DIABETES MELLITUS, TYPE II 10/26/2007  . HYPERLIPIDEMIA 10/26/2007  . HYPERTENSION 10/26/2007   Past Medical History  Diagnosis Date  . Hyperlipidemia   . Anemia   . Cataract   . Diabetes mellitus     sees Dr. Casimiro Needle Altheimer  . Hypertension     sees Dr. Angelina Sheriff  . Gynecological examination     sees Dr. Jennette Kettle    Past Surgical History  Procedure Laterality Date  . Excision of benign cyst      from left maxilla and left maxillary sinus  . Colonoscopy  11-26-07    per Dr. Jarold Motto, repeat in 10 yrs   . Knee surgery     History  Substance Use Topics  . Smoking status: Never Smoker   . Smokeless tobacco: Never Used  . Alcohol Use: No   Family History  Problem Relation Age of Onset  . Coronary artery disease Mother   . Coronary artery disease Brother   . Cancer      fhx  . Diabetes      fhx  . Hyperlipidemia      fhx  . Hypertension      fhx  . Stroke      fhx   No Known Allergies Current Outpatient Prescriptions on File Prior to Visit  Medication Sig Dispense Refill  . albuterol (PROVENTIL HFA;VENTOLIN HFA) 108 (90 BASE) MCG/ACT inhaler Inhale 2 puffs into the lungs every 4 (four) hours as needed for wheezing.  1 Inhaler  6  . aspirin 81  MG tablet Take 81 mg by mouth at bedtime.       Marland Kitchen atorvastatin (LIPITOR) 40 MG tablet Take 40 mg by mouth at bedtime.       . bumetanide (BUMEX) 1 MG tablet Take 2 mg by mouth at bedtime.       . cholecalciferol (VITAMIN D) 1000 UNITS tablet Take 2,000 Units by mouth at bedtime.       Marland Kitchen diltiazem (CARDIZEM CD) 300 MG 24 hr capsule Take 300 mg by mouth at bedtime.       Marland Kitchen doxazosin (CARDURA) 8 MG tablet Take 8 mg by mouth at bedtime.        Marland Kitchen glimepiride (AMARYL) 4 MG tablet Take 2 mg by mouth daily before breakfast. 1/2 tab daily      . HYDROcodone-homatropine (HYDROMET) 5-1.5 MG/5ML syrup Take 5 mLs by mouth every 4 (four) hours as needed for cough.  240 mL  0  . insulin aspart (NOVOLOG) 100 UNIT/ML injection Inject 8 Units into the skin as needed for high blood sugar. Before lunch      . insulin glargine (LANTUS) 100 UNIT/ML injection Inject 10 Units into  the skin at bedtime.        . iron polysaccharides (NIFEREX) 150 MG capsule Take 150 mg by mouth at bedtime.       Marland Kitchen linagliptin (TRADJENTA) 5 MG TABS tablet Take 5 mg by mouth every morning.      . metoprolol (LOPRESSOR) 50 MG tablet Take 25 mg by mouth 2 (two) times daily. 1/2 in am 1/2 in pm      . pioglitazone (ACTOS) 45 MG tablet Take 45 mg by mouth at bedtime.       . quinapril (ACCUPRIL) 40 MG tablet Take 20 mg by mouth at bedtime. Take 1/2       No current facility-administered medications on file prior to visit.    Review of Systems Review of Systems  Constitutional: Negative for fever, appetite change, and unexpected weight change.  ENT pos for congestion and post nasal drip  Eyes: Negative for pain and visual disturbance.  Respiratory: Negative for  shortness of breath.   Cardiovascular: Negative for cp or palpitations    Gastrointestinal: Negative for nausea, diarrhea and constipation.  Genitourinary: Negative for urgency and frequency.  Skin: Negative for pallor or rash   Neurological: Negative for weakness,  light-headedness, numbness and headaches.  Hematological: Negative for adenopathy. Does not bruise/bleed easily.  Psychiatric/Behavioral: Negative for dysphoric mood. The patient is not nervous/anxious.         Objective:   Physical Exam  Constitutional: She appears well-developed and well-nourished. No distress.  obese and well appearing   HENT:  Head: Normocephalic and atraumatic.  Right Ear: External ear normal.  Left Ear: External ear normal.  Mouth/Throat: Oropharynx is clear and moist. No oropharyngeal exudate.  Nares are injected and congested  No sinus tenderness   Eyes: Conjunctivae and EOM are normal. Pupils are equal, round, and reactive to light. Right eye exhibits no discharge. Left eye exhibits no discharge. No scleral icterus.  Neck: Normal range of motion. Neck supple. No thyromegaly present.  Cardiovascular: Normal rate and regular rhythm.   Pulmonary/Chest: No respiratory distress. She has wheezes. She has no rales. She exhibits no tenderness.  Harsh bs with scant wheeze on forced exp only  No rales or rhonchi   Lymphadenopathy:    She has no cervical adenopathy.  Neurological: She is alert.  Skin: Skin is warm and dry. No rash noted.  Psychiatric: She has a normal mood and affect.          Assessment & Plan:

## 2013-03-16 NOTE — Assessment & Plan Note (Signed)
With prod cough and mild wheeze Cover with zpak  Diflucan if needed for yeast infection if she gets one Albuterol prn mdi -knows how to use it  Update if not starting to improve in a week or if worsening

## 2013-03-25 ENCOUNTER — Telehealth: Payer: Self-pay | Admitting: Family Medicine

## 2013-03-25 NOTE — Telephone Encounter (Signed)
noted 

## 2013-03-25 NOTE — Telephone Encounter (Signed)
Patient Information:  Caller Name: Krista Benjamin  Phone: [phone]  Patient: Krista Benjamin, Krista Benjamin  Gender: Female  DOB: 03/23/1951  Age: 62 Years  PCP: Gershon Crane [facility])  Office Follow Up:  Does the office need to follow up with this patient?: No  Instructions For The Office: N/A  RN Note:  PT was see on 6-7, [name], diagnosed w/ Bronchitis, Zpack given and finished.  Pt still has productive Cough, white sputum.  Pt denies Chest Pain, Wheezing or Sinus Pain. Cough has improved but not completely gone away.  Pt has hard coughing spell on 6-16 upon waking, vomited small amount.  Pt is currently out of state.  Advised Pt to f/u w/ Urgent Care for re-eval, discussed herbal tea and honey for Cough and advised Pt to f/u w/ Dr Clent Ridges when back in town.  Pt verbalized understanding.   Symptoms  Reason For Call & Symptoms: F/U Bronchitis  Reviewed Health History In EMR: Yes  Reviewed Medications In EMR: Yes  Reviewed Allergies In EMR: Yes  Reviewed Surgeries / Procedures: Yes  Date of Onset of Symptoms: 03/16/2013  Treatments Tried: Zpack and Alburetol  Treatments Tried Worked: No  Guideline(s) Used:  Cough  Disposition Per Guideline:   See Today in Office  Reason For Disposition Reached:   Severe coughing spells (e.g., whooping sound after coughing, vomiting after coughing)  Advice Given:  N/A  Patient Will Follow Care Advice:  YES

## 2013-04-09 ENCOUNTER — Telehealth: Payer: Self-pay | Admitting: Family Medicine

## 2013-04-09 DIAGNOSIS — E119 Type 2 diabetes mellitus without complications: Secondary | ICD-10-CM

## 2013-04-09 NOTE — Telephone Encounter (Signed)
I will refer her to [facility], they are excellent. They will call her about the appt.

## 2013-04-09 NOTE — Telephone Encounter (Signed)
Pt is now seeing Dr. Leslie Dales, however he is moving his practice and patient won't be able to travel the distance to the new one.  Patient would like for Dr. Clent Ridges to refer her to a "good" diabetic doctor.  I explained to her, that Endocrinology would be able to assist with her needs.  She wants to go to whichever doctor Dr Clent Ridges recommends.  Patient would like to have an answer by Thursday because she states that her medical records have to be released by then. Please advise. Thank you.

## 2013-04-09 NOTE — Telephone Encounter (Signed)
I spoke with pt  

## 2013-04-29 ENCOUNTER — Telehealth: Payer: Self-pay | Admitting: Family Medicine

## 2013-04-29 NOTE — Telephone Encounter (Signed)
This is a Dr Clent Ridges pt

## 2013-04-29 NOTE — Telephone Encounter (Signed)
Pt is calling to request a 1 month refill of her bumetanide (BUMEX) 1 MG tablet. [facility] [address] Her appt with Dr. Dan Europe isn't until then end of July, following that appt, they will take care of this RX. Please assist.

## 2013-04-29 NOTE — Telephone Encounter (Signed)
Patient Information:  Caller Name: Janella  Phone: [phone]  Patient: Krista Benjamin, [place]  Gender: Female  DOB: [birth year]  Age: 62 Years  PCP: Artist Pais Doe-Hyun Molly Maduro) (Adults only)  Office Follow Up:  Does the office need to follow up with this patient?: Yes  Instructions For The Office: Please review information - contact patient at  [phone] with further directions expecially concerning B/P medication and Amaryl (that she had mentioned)  RN Note:  Blood sugar this am after breakfast 118  (running 71-167). Pulse readings with BP 68 - 92. Has Appt with Endocrinologist tomorrow 7/22  Symptoms  Reason For Call & Symptoms: Takes BP medication at HS.   Experincing lightheaded, dizziness especially in the mornings. Usually get better of not moving about or if is sitting.  Has been on blood pressure meds:  glimeperide, metoprolol, guinapril and she has halved there meds and then halved them again due to the lightheaded ness.  Has not changed any other meds.  BP this am 91/46.  Asking what to do about blood pressure meds.  Reviewed Health History In EMR: Yes  Reviewed Medications In EMR: Yes  Reviewed Allergies In EMR: Yes  Reviewed Surgeries / Procedures: Yes  Date of Onset of Symptoms: 04/14/2013  Guideline(s) Used:  Dizziness  Disposition Per Guideline:   Discuss with PCP and Callback by Nurse Today  Reason For Disposition Reached:   Taking a medicine that could cause dizziness (e.g., blood pressure medications, diuretics)  Advice Given:  Rest for 1-2 Hours:  Lie down with feet elevated for 1 hour. This will improve blood flow and increase blood flow to the brain.  Stand Up Slowly:  In the mornings, sit up for a few minutes before you stand up. That will help your blood flow make the adjustment.  If you have to stand up for long periods of time, contract and relax your leg muscles to help pump the blood back to the heart.  Sit down or lie down if you feel dizzy.  Call  Back If:  Still feel dizzy after 2 hours of rest and fluids  Passes out (faints)  You become worse.  Patient Will Follow Care Advice:  YES

## 2013-04-30 ENCOUNTER — Telehealth: Payer: Self-pay | Admitting: Family Medicine

## 2013-04-30 ENCOUNTER — Encounter: Payer: Self-pay | Admitting: Family Medicine

## 2013-04-30 ENCOUNTER — Ambulatory Visit (INDEPENDENT_AMBULATORY_CARE_PROVIDER_SITE_OTHER): Payer: BC Managed Care – PPO | Admitting: Family Medicine

## 2013-04-30 VITALS — BP 150/82 | HR 95 | Temp 98.1°F | Wt 300.0 lb

## 2013-04-30 DIAGNOSIS — I10 Essential (primary) hypertension: Secondary | ICD-10-CM

## 2013-04-30 DIAGNOSIS — J019 Acute sinusitis, unspecified: Secondary | ICD-10-CM

## 2013-04-30 MED ORDER — DILTIAZEM HCL ER 240 MG PO CP24: 240.0000 mg | ORAL_CAPSULE | Freq: Every day | ORAL | Status: DC

## 2013-04-30 MED ORDER — QUINAPRIL HCL 20 MG PO TABS: 20.0000 mg | ORAL_TABLET | Freq: Every day | ORAL | Status: DC

## 2013-04-30 MED ORDER — BUMETANIDE 1 MG PO TABS: 2.0000 mg | ORAL_TABLET | Freq: Every day | ORAL | Status: DC

## 2013-04-30 MED ORDER — METOPROLOL TARTRATE 50 MG PO TABS: 50.0000 mg | ORAL_TABLET | Freq: Every day | ORAL | Status: DC

## 2013-04-30 MED ORDER — AMOXICILLIN-POT CLAVULANATE 875-125 MG PO TABS: 1.0000 | ORAL_TABLET | Freq: Two times a day (BID) | ORAL | Status: DC
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
## 2013-04-30 NOTE — Progress Notes (Signed)
  Subjective:    Patient ID: Krista Benjamin, female    DOB: 1951-08-21, 62 y.o.   MRN: [id number]  HPI Here for several things. First she has had a cough for the past month. She has sinus congestion and PND, and this causes a non-productive cough. No fevers. She took a Zpack which did not help much. Also her BP has been dropping low for several weeks, often in the 90s systolic. She has been splitting some of her BP meds, either into halves or even fourths. Her BP meds used to be written by Dr. Leslie Dales, but now we will assume management of this.    Review of Systems  Constitutional: Negative.   HENT: Positive for congestion and postnasal drip.   Eyes: Negative.   Respiratory: Positive for cough. Negative for shortness of breath and wheezing.   Cardiovascular: Negative.        Objective:   Physical Exam  Constitutional: She appears well-developed and well-nourished.  HENT:  Right Ear: External ear normal.  Left Ear: External ear normal.  Nose: Nose normal.  Mouth/Throat: Oropharynx is clear and moist.  Eyes: Conjunctivae are normal.  Neck: No thyromegaly present.  Cardiovascular: Normal rate, regular rhythm, normal heart sounds and intact distal pulses.   Pulmonary/Chest: Effort normal and breath sounds normal.  Lymphadenopathy:    She has no cervical adenopathy.          Assessment & Plan:  Treat the sinusitis with Augmentin. We need to reduce her BP meds. Stop the Cardura completely. Stay on metoprolol 50 mg daily and the Bumex 2 mg daily. Decrease the Diltiazem to 240 mg daily and decrease the Quinapril to 20 mg daily. Recheck in 3-4 weeks.

## 2013-04-30 NOTE — Telephone Encounter (Signed)
Pt is on the schedule for today to see Dr. Clent Ridges 04/30/13.

## 2013-04-30 NOTE — Telephone Encounter (Signed)
She needs an OV for this  

## 2013-05-01 ENCOUNTER — Telehealth: Payer: Self-pay | Admitting: Family Medicine

## 2013-05-01 MED ORDER — GLIMEPIRIDE 4 MG PO TABS: ORAL_TABLET | ORAL | Status: DC

## 2013-05-01 NOTE — Telephone Encounter (Signed)
Pt requested a refill on Amaryl 4 mg take 1/2 tablet every day, also a 90 day supply and I did send e-scribe.

## 2013-05-07 ENCOUNTER — Ambulatory Visit (INDEPENDENT_AMBULATORY_CARE_PROVIDER_SITE_OTHER): Payer: BC Managed Care – PPO | Admitting: Internal Medicine

## 2013-05-07 ENCOUNTER — Encounter: Payer: Self-pay | Admitting: Internal Medicine

## 2013-05-07 VITALS — BP 122/70 | HR 67 | Temp 97.9°F | Resp 16 | Wt 302.0 lb

## 2013-05-07 DIAGNOSIS — E119 Type 2 diabetes mellitus without complications: Secondary | ICD-10-CM

## 2013-05-07 LAB — HEMOGLOBIN A1C: Hgb A1c MFr Bld: 6.5 % (ref 4.6–6.5)

## 2013-05-07 LAB — COMPREHENSIVE METABOLIC PANEL WITH GFR
ALT: 14 U/L (ref 0–35)
AST: 17 U/L (ref 0–37)
Albumin: 3.7 g/dL (ref 3.5–5.2)
Alkaline Phosphatase: 58 U/L (ref 39–117)
BUN: 22 mg/dL (ref 6–23)
CO2: 30 meq/L (ref 19–32)
Calcium: 9.1 mg/dL (ref 8.4–10.5)
Chloride: 106 meq/L (ref 96–112)
Creatinine, Ser: 1.1 mg/dL (ref 0.4–1.2)
GFR: 61.9 mL/min
Glucose, Bld: 53 mg/dL — ABNORMAL LOW (ref 70–99)
Potassium: 3.8 meq/L (ref 3.5–5.1)
Sodium: 142 meq/L (ref 135–145)
Total Bilirubin: 0.5 mg/dL (ref 0.3–1.2)
Total Protein: 7.1 g/dL (ref 6.0–8.3)

## 2013-05-07 NOTE — Progress Notes (Signed)
Patient ID: Krista Benjamin, female   DOB: 15-Jan-1951, 62 y.o.   MRN: [id number]  HPI: Krista Benjamin is a 62 y.o.-year-old female, referred by her PCP, Dr. 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 She previously saw Dr. Leslie Dales until he moved his practice.   Patient has been diagnosed with diabetes in 1994; she started insulin in 1999. Last hemoglobin A1c was: 5.9% on 12/2012, previously 6.1%.  Pt is on a regimen of: - Metformin 1000 mg po bid >> not taking - Lantus 10 units qhs when >150 - Novolog 8 units tid ac, his sugars >140 - very seldom - Amaryl 2 mg daily - Tradjenta 5 mg daily - Actos 45 mg daily since 07/2001  Pt checks her sugars 2x day and they are: - am: 74-100 - bedtime: 70-100 Lowest CBG 68; she has hypoglycemia awareness at 70. Highest sugar was 200 after eating starches  I reviewed Dr. Altheimer's last note on 01/02/2013, she was having some mild low blood sugars in a.m. At the time that he was seeing her.  Pt's meals are: - Breakfast: skips (sleeps late) - Lunch: meat + veggies - Dinner: meat + veggies - Snacks: 1-2, fruit, yoghurt She started to eat only meat and vegetables >> sugars improved.  Had a meniscus surgery, but needs to return at a gym now.  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 Latest ACR was 9.4 on 12/2012. - last lipid profile (on Pravastatin: 128/56/42/68 (12/2012). - last eye exam was in 09/2012 by Dr. Ashley Royalty: Background diabetic retinopathy - no numbness and tingling in her legs  I reviewed her chart and she also has a history of hypertension, hyperlipidemia-on Lipitor, iron deficiency anemia-on iron therapy, aortic valve disease/murmur, left ventricular hypertrophy per EKG, obesity, multinodular goiter-recent TSH 1.6 on 09/2012, fibroids.  Pt has FH of DM in father, brother, son.  ROS: Constitutional: no weight  gain/loss, no fatigue, no subjective hyperthermia/hypothermia Eyes: no blurry vision, no xerophthalmia ENT: no sore throat, no nodules palpated in throat, no dysphagia/odynophagia, no hoarseness Cardiovascular: no CP/SOB/palpitations/leg swelling Respiratory: no cough/SOB Gastrointestinal: no N/V/D/C Musculoskeletal: no muscle/joint aches Skin: no rashes Neurological: no tremors/numbness/tingling/dizziness Psychiatric: no depression/anxiety  Past Medical History  Diagnosis Date  . Hyperlipidemia   . Anemia   . Cataract   . Hypertension     sees Dr. Angelina Sheriff  . Gynecological examination     sees Dr. Jennette Kettle   . Diabetes mellitus     sees Dr. Elvera Lennox    Past Surgical History  Procedure Laterality Date  . Excision of benign cyst      from left maxilla and left maxillary sinus  . Colonoscopy  11-26-07    per Dr. Jarold Motto, repeat in 10 yrs   . Knee surgery     History   Social History  . Marital Status: Married    Spouse Name: N/A    Number of Children: N/A  . Years of Education: N/A   Occupational History  . Not on file.   Social History Main Topics  . Smoking status: Never Smoker   . Smokeless tobacco: Never Used  . Alcohol Use: No  . Drug Use: No  . Sexually Active: Not on file   Other Topics Concern  . Not on file   Social History Narrative  . No narrative on file    Current Outpatient Prescriptions on File Prior to Visit  Medication Sig Dispense  Refill  . albuterol (PROVENTIL HFA;VENTOLIN HFA) 108 (90 BASE) MCG/ACT inhaler Inhale 2 puffs into the lungs every 4 (four) hours as needed for wheezing.  1 Inhaler  6  . amoxicillin-clavulanate (AUGMENTIN) 875-125 MG per tablet Take 1 tablet by mouth 2 (two) times daily.  20 tablet  0  . aspirin 81 MG tablet Take 81 mg by mouth at bedtime.       Marland Kitchen atorvastatin (LIPITOR) 40 MG tablet Take 40 mg by mouth at bedtime.       . bumetanide (BUMEX) 1 MG tablet Take 2 tablets (2 mg total) by mouth at bedtime.  60 tablet   11  . cholecalciferol (VITAMIN D) 1000 UNITS tablet Take 2,000 Units by mouth at bedtime.       Marland Kitchen diltiazem (DILACOR XR) 240 MG 24 hr capsule Take 1 capsule (240 mg total) by mouth daily.  30 capsule  11  . glimepiride (AMARYL) 4 MG tablet Take 1/2 tablet every day  90 tablet  3  . insulin aspart (NOVOLOG) 100 UNIT/ML injection Inject 8 Units into the skin as needed for high blood sugar. Before lunch      . insulin glargine (LANTUS) 100 UNIT/ML injection Inject 10 Units into the skin at bedtime.        . iron polysaccharides (NIFEREX) 150 MG capsule Take 150 mg by mouth at bedtime.       Marland Kitchen linagliptin (TRADJENTA) 5 MG TABS tablet Take 5 mg by mouth every morning.      . pioglitazone (ACTOS) 45 MG tablet Take 45 mg by mouth at bedtime.       . quinapril (ACCUPRIL) 20 MG tablet Take 1 tablet (20 mg total) by mouth at bedtime.  30 tablet  11  . HYDROcodone-homatropine (HYDROMET) 5-1.5 MG/5ML syrup Take 5 mLs by mouth every 4 (four) hours as needed for cough.  240 mL  0  . metoprolol (LOPRESSOR) 50 MG tablet Take 1 tablet (50 mg total) by mouth daily.  30 tablet  11   No current facility-administered medications on file prior to visit.   No Known Allergies  Family History  Problem Relation Age of Onset  . Coronary artery disease Mother   . Coronary artery disease Brother   . Cancer      fhx  . Diabetes      fhx  . Hyperlipidemia      fhx  . Hypertension      fhx  . Stroke      fhx   PE: BP 122/70  Pulse 67  Temp(Src) 97.9 F (36.6 C) (Oral)  Resp 16  Wt 302 lb (136.986 kg)  BMI 48.77 kg/m2  SpO2 97% Wt Readings from Last 3 Encounters:  05/07/13 302 lb (136.986 kg)  04/30/13 300 lb (136.079 kg)  03/16/13 298 lb (135.172 kg)   Constitutional: obese, in NAD Eyes: PERRLA, EOMI, no exophthalmos ENT: moist mucous membranes, no thyromegaly, no cervical lymphadenopathy Cardiovascular: RRR, No MRG, + nonpitting edema lower legs Respiratory: CTA B Gastrointestinal: abdomen soft,  NT, ND, BS+ Musculoskeletal: no deformities, strength intact in all 4 Skin: moist, warm, stasis dermatitis on legs Neurological: no tremor with outstretched hands, DTR normal in all 4  ASSESSMENT: 1. DM2, insulin-dependent, controlled, with complications - CKD stage 2-3 - background DR  PLAN:  1.  Patient with long-standing diabetes, with very good control, especially lately, but on a rather complex medication regimen. She is not on metformin, and mentions that she has not  been on this medication, I am not sure why. Her kidney function is borderline for metformin use, so will continue to stay off the metformin, especially since her hemoglobin A1c was 5.9 at last check. We downloaded her meter, and the sugars are almost entirely at goal. Low sugar was 65 x1 in last month. - I advised her to stop Actos - she tells me that she has wanted to come off the medication since she heard about the side effects  - at next visit, we might be able to stop Lantus. For now, she is using both Lantus and NovoLog as backup. - continue the following: - Lantus 10 units qhs when >150  - Novolog 8 units tid ac, his sugars >140 - Amaryl 2 mg daily  - Tradjenta 5 mg daily - given sugar log and advised how to fill it and to bring it at next appt - check sugars once a day (can also check twice if she prefers), rotating checks times - given foot care handout and explained the principles - given instructions for hypoglycemia management "15-15 rule" - will check a hemoglobin A1c and a CMP at this visit. No ACR needed since on ACE inhibitor. - I will see her back in 3 months with her sugar log   Office Visit on 05/07/2013  Component Date Value Range Status  . Hemoglobin A1C 05/07/2013 6.5  4.6 - 6.5 % Final   Glycemic Control Guidelines for People with Diabetes:Non Diabetic:  <6%Goal of Therapy: <7%Additional Action Suggested:  >8%   . Sodium 05/07/2013 142  135 - 145 mEq/L Final  . Potassium 05/07/2013 3.8  3.5 -  5.1 mEq/L Final  . Chloride 05/07/2013 106  96 - 112 mEq/L Final  . CO2 05/07/2013 30  19 - 32 mEq/L Final  . Glucose, Bld 05/07/2013 53* 70 - 99 mg/dL Final  . BUN 45/40/9811 22  6 - 23 mg/dL Final  . Creatinine, Ser 05/07/2013 1.1  0.4 - 1.2 mg/dL Final  . Total Bilirubin 05/07/2013 0.5  0.3 - 1.2 mg/dL Final  . Alkaline Phosphatase 05/07/2013 58  39 - 117 U/L Final  . AST 05/07/2013 17  0 - 37 U/L Final  . ALT 05/07/2013 14  0 - 35 U/L Final  . Total Protein 05/07/2013 7.1  6.0 - 8.3 g/dL Final  . Albumin 91/47/8295 3.7  3.5 - 5.2 g/dL Final  . Calcium 62/13/0865 9.1  8.4 - 10.5 mg/dL Final  . GFR 78/46/9629 61.90  >60.00 mL/min Final   HbA1c at goal, although higher than before. Low CBG >> please see med change above.

## 2013-05-07 NOTE — Patient Instructions (Addendum)
Please stop Actos. Continue: - Lantus 10 units at bedtime  - Novolog 8 units tid ac, his sugars >140  - Amaryl 2 mg daily - Tradjenta 5 mg daily Please return in 3 months with your sugar log.   PATIENT INSTRUCTIONS FOR TYPE 2 DIABETES:  **Please join MyChart!** - see attached instructions about how to join   DIET AND EXERCISE Diet and exercise is an important part of diabetic treatment.  We recommended aerobic exercise in the form of brisk walking (working between 40-60% of maximal aerobic capacity, similar to brisk walking) for 150 minutes per week (such as 30 minutes five days per week) along with 3 times per week performing 'resistance' training (using various gauge rubber tubes with handles) 5-10 exercises involving the major muscle groups (upper body, lower body and core) performing 10-15 repetitions (or near fatigue) each exercise. Start at half the above goal but build slowly to reach the above goals. If limited by weight, joint pain, or disability, we recommend daily walking in a swimming pool with water up to waist to reduce pressure from joints while allow for adequate exercise.    BLOOD GLUCOSES Monitoring your blood glucoses is important for continued management of your diabetes. Please check your blood glucoses 2-4 times a day: fasting, before meals and at bedtime (you can rotate these measurements - e.g. one day check before the 3 meals, the next day check before 2 of the meals and before bedtime, etc.   HYPOGLYCEMIA (low blood sugar) Hypoglycemia is usually a reaction to not eating, exercising, or taking too much insulin/ other diabetes drugs.  Symptoms include tremors, sweating, hunger, confusion, headache, etc. Treat IMMEDIATELY with 15 grams of Carbs:   4 glucose tablets    cup regular juice/soda   2 tablespoons raisins   4 teaspoons sugar   1 tablespoon honey Recheck blood glucose in 15 mins and repeat above if still symptomatic/blood glucose <100. Please contact our  office at [phone] if you have questions about how to next handle your insulin.  RECOMMENDATIONS TO REDUCE YOUR RISK OF DIABETIC COMPLICATIONS: * Take your prescribed MEDICATION(S). * Follow a DIABETIC diet: Complex carbs, fiber rich foods, heart healthy fish twice weekly, (monounsaturated and polyunsaturated) fats * AVOID saturated/trans fats, high fat foods, >2,300 mg salt per day. * EXERCISE at least 5 times a week for 30 minutes or preferably daily.  * DO NOT SMOKE OR DRINK more than 1 drink a day. * Check your FEET every day. Do not wear tightfitting shoes. Contact us if you develop an ulcer * See your EYE doctor once a year or more if needed * Get a FLU shot once a year * Get a PNEUMONIA vaccine once before and once after age 54 years  GOALS:  * Your Hemoglobin A1c of <7%  * fasting sugars need to be <130 * after meals sugars need to be <180 (2h after you start eating) * Your Systolic BP should be 140 or lower  * Your Diastolic BP should be 80 or lower  * Your HDL (Good Cholesterol) should be 40 or higher  * Your LDL (Bad Cholesterol) should be 100 or lower  * Your Triglycerides should be 150 or lower  * Your Urine microalbumin (kidney function) should be <30 * Your Body Mass Index should be 25 or lower   We will be glad to help you achieve these goals. Our telephone number is: [phone].

## 2013-07-15 ENCOUNTER — Ambulatory Visit (INDEPENDENT_AMBULATORY_CARE_PROVIDER_SITE_OTHER): Payer: BC Managed Care – PPO | Admitting: Family Medicine

## 2013-07-15 ENCOUNTER — Encounter: Payer: Self-pay | Admitting: Family Medicine

## 2013-07-15 VITALS — BP 138/76 | HR 97 | Temp 98.0°F | Ht 64.75 in | Wt 276.0 lb

## 2013-07-15 DIAGNOSIS — Z0000 Encounter for general adult medical examination without abnormal findings: Secondary | ICD-10-CM

## 2013-07-15 DIAGNOSIS — Z23 Encounter for immunization: Secondary | ICD-10-CM

## 2013-07-15 LAB — POCT URINALYSIS DIPSTICK
Bilirubin, UA: NEGATIVE
Blood, UA: NEGATIVE
Glucose, UA: NEGATIVE
Ketones, UA: NEGATIVE
Leukocytes, UA: NEGATIVE
Nitrite, UA: NEGATIVE
Protein, UA: NEGATIVE
Spec Grav, UA: 1.02
Urobilinogen, UA: 0.2
pH, UA: 6

## 2013-07-15 LAB — BASIC METABOLIC PANEL
BUN: 27 mg/dL — ABNORMAL HIGH (ref 6–23)
CO2: 29 mEq/L (ref 19–32)
Calcium: 9.1 mg/dL (ref 8.4–10.5)
Chloride: 102 mEq/L (ref 96–112)
Creatinine, Ser: 0.9 mg/dL (ref 0.4–1.2)
GFR: 86.81 mL/min (ref >60.00)
Glucose, Bld: 80 mg/dL (ref 70–99)
Potassium: 4.8 mEq/L (ref 3.5–5.1)
Sodium: 138 mEq/L (ref 135–145)

## 2013-07-15 LAB — CBC WITH DIFFERENTIAL/PLATELET
Basophils Absolute: 0 10*3/uL (ref 0.0–0.1)
Basophils Relative: 0.2 % (ref 0.0–3.0)
Eosinophils Absolute: 0.1 10*3/uL (ref 0.0–0.7)
Eosinophils Relative: 1 % (ref 0.0–5.0)
HCT: 35.1 % — ABNORMAL LOW (ref 36.0–46.0)
Hemoglobin: 11.6 g/dL — ABNORMAL LOW (ref 12.0–15.0)
Lymphocytes Relative: 18.7 % (ref 12.0–46.0)
Lymphs Abs: 2.5 10*3/uL (ref 0.7–4.0)
MCHC: 33.2 g/dL (ref 30.0–36.0)
MCV: 79.4 fl (ref 78.0–100.0)
Monocytes Absolute: 0.7 10*3/uL (ref 0.1–1.0)
Monocytes Relative: 5.4 % (ref 3.0–12.0)
Neutro Abs: 9.8 10*3/uL — ABNORMAL HIGH (ref 1.4–7.7)
Neutrophils Relative %: 74.7 % (ref 43.0–77.0)
Platelets: 314 10*3/uL (ref 150.0–400.0)
RBC: 4.42 Mil/uL (ref 3.87–5.11)
RDW: 15.6 % — ABNORMAL HIGH (ref 11.5–14.6)
WBC: 13.2 10*3/uL — ABNORMAL HIGH (ref 4.5–10.5)

## 2013-07-15 LAB — TSH: TSH: 1.01 u[IU]/mL (ref 0.35–5.50)

## 2013-07-15 LAB — HEPATIC FUNCTION PANEL
ALT: 19 U/L (ref 0–35)
AST: 18 U/L (ref 0–37)
Albumin: 3.5 g/dL (ref 3.5–5.2)
Alkaline Phosphatase: 73 U/L (ref 39–117)
Bilirubin, Direct: 0 mg/dL (ref 0.0–0.3)
Total Bilirubin: 0.6 mg/dL (ref 0.3–1.2)
Total Protein: 7.4 g/dL (ref 6.0–8.3)

## 2013-07-15 LAB — LIPID PANEL
Cholesterol: 148 mg/dL (ref 0–200)
HDL: 41.9 mg/dL (ref >39.00)
LDL Cholesterol: 94 mg/dL (ref 0–99)
Total CHOL/HDL Ratio: 4
Triglycerides: 63 mg/dL (ref 0.0–149.0)
VLDL: 12.6 mg/dL (ref 0.0–40.0)

## 2013-07-15 MED ORDER — ATORVASTATIN CALCIUM 40 MG PO TABS: 40.0000 mg | ORAL_TABLET | Freq: Every day | ORAL | Status: DC

## 2013-07-15 NOTE — Addendum Note (Signed)
Addended by: Azucena Freed on: 07/15/2013 10:19 AM   Modules accepted: Orders

## 2013-07-15 NOTE — Progress Notes (Signed)
  Subjective:    Patient ID: Krista Benjamin, female    DOB: [birth year], 62 y.o.   MRN: [id number]  HPI 62 yr old female for a cpx. She feels well and has no concerns. She has lost a lot of weight and feels the best she has for years. She recently saw Dr. Elvera Lennox for her diabetes and her A1c was down to 6.5.    Review of Systems  Constitutional: Negative.   HENT: Negative.   Eyes: Negative.   Respiratory: Negative.   Cardiovascular: Negative.   Gastrointestinal: Negative.   Genitourinary: Negative for dysuria, urgency, frequency, hematuria, flank pain, decreased urine volume, enuresis, difficulty urinating, pelvic pain and dyspareunia.  Musculoskeletal: Negative.   Skin: Negative.   Neurological: Negative.   Psychiatric/Behavioral: Negative.        Objective:   Physical Exam  Constitutional: She is oriented to person, place, and time. She appears well-developed and well-nourished. No distress.  HENT:  Head: Normocephalic and atraumatic.  Right Ear: External ear normal.  Left Ear: External ear normal.  Nose: Nose normal.  Mouth/Throat: Oropharynx is clear and moist. No oropharyngeal exudate.  Eyes: Conjunctivae and EOM are normal. Pupils are equal, round, and reactive to light. No scleral icterus.  Neck: Normal range of motion. Neck supple. No JVD present. No thyromegaly present.  Cardiovascular: Normal rate, regular rhythm, normal heart sounds and intact distal pulses.  Exam reveals no gallop and no friction rub.   No murmur heard. Pulmonary/Chest: Effort normal and breath sounds normal. No respiratory distress. She has no wheezes. She has no rales. She exhibits no tenderness.  Abdominal: Soft. Bowel sounds are normal. She exhibits no distension and no mass. There is no tenderness. There is no rebound and no guarding.  Musculoskeletal: Normal range of motion. She exhibits no edema and no tenderness.  Lymphadenopathy:    She has no cervical adenopathy.  Neurological: She is  alert and oriented to person, place, and time. She has normal reflexes. No cranial nerve deficit. She exhibits normal muscle tone. Coordination normal.  Skin: Skin is warm and dry. No rash noted. No erythema.  Psychiatric: She has a normal mood and affect. Her behavior is normal. Judgment and thought content normal.          Assessment & Plan:  Well exam. Get fasting labs

## 2013-07-17 NOTE — Progress Notes (Signed)
Quick Note:  I released results in my chart. ______ 

## 2013-08-06 ENCOUNTER — Telehealth: Payer: Self-pay | Admitting: Internal Medicine

## 2013-08-06 ENCOUNTER — Encounter: Payer: Self-pay | Admitting: Internal Medicine

## 2013-08-06 ENCOUNTER — Ambulatory Visit (INDEPENDENT_AMBULATORY_CARE_PROVIDER_SITE_OTHER): Payer: BC Managed Care – PPO | Admitting: Internal Medicine

## 2013-08-06 VITALS — BP 124/68 | HR 62 | Temp 98.5°F | Resp 12 | Wt 273.5 lb

## 2013-08-06 DIAGNOSIS — E119 Type 2 diabetes mellitus without complications: Secondary | ICD-10-CM

## 2013-08-06 MED ORDER — METFORMIN HCL 500 MG PO TABS: 1000.0000 mg | ORAL_TABLET | Freq: Two times a day (BID) | ORAL | Status: DC

## 2013-08-06 NOTE — Progress Notes (Signed)
Patient ID: Krista Benjamin, female   DOB: Sep 29, 1951, 62 y.o.   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    Last hemoglobin A1c was:  Lab Results  Component Value Date   HGBA1C 6.5 05/07/2013   HGBA1C 7.9* 11/21/2007  Prev. 5.9% on 12/2012, previously 6.1%.  She had a shoulder steroid inj 4 weeks ago >> sugars higher lately.  Pt is on a regimen of: - Lantus 10 units qhs - takes it every night now - Novolog 8 units tid ac, when sugars >140 - very seldom - but used 12 units right after her steroid inj - Amaryl 2 mg daily - Tradjenta 5 mg daily We stopped Actos 45 mg (was on it since 07/2001) Metformin 1000 mg po bid >> did not take in the past, as she had mild CKD.  Pt checks her sugars 2x day and they are: - am: 74-100 >> 90-120 before the CS inj  - bedtime: 70-100 >> 65-75 Lowest CBG 65; she has hypoglycemia awareness at 70. Highest sugar was 300s right after the steroid inj. She does not plan to have a new one.   I reviewed Dr. Altheimer's last note on 01/02/2013, she was having some mild low blood sugars in a.m. At the time that he was seeing her.  Pt's meals are now: - Breakfast: was skipping, but now smoothies - Lunch: meat + veggies  - Dinner: meat + veggies - Snacks: 1-2, fruit, yoghurt Pt cut down meats. She started to eat only meat and vegetables >> sugars improved >> feels much better, has more energy, swelling in legs better. She is going to the gym>> bikes 4 miles 2x a week  - has CKD stage 2-3ast BUN/creatinine was:  Lab Results  Component Value Date   BUN 27* 07/15/2013   Lab Results  Component Value Date   CREATININE 0.9 07/15/2013  Previously 38/1.23-GFR 53; she is on quinapril. Latest ACR was 9.4 on 12/2012.  - last lipid profile: Lab Results  Component Value Date   CHOL 148 07/15/2013   HDL 41.90  07/15/2013   LDLCALC 94 07/15/2013   TRIG 63.0 07/15/2013   CHOLHDL 4 07/15/2013  She is on Pravastatin. - last eye exam was in 09/2012 by Dr. Ashley Royalty: Background diabetic retinopathy - no numbness and tingling in her legs  She also has a history of hypertension, hyperlipidemia-on Lipitor, iron deficiency anemia-on iron therapy, aortic valve disease/murmur, left ventricular hypertrophy per EKG, obesity, multinodular goiter, fibroids.  I reviewed pt's medications, allergies, PMH, social hx, family hx and no changes required, except as mentioned above. She started a "good fat" supplement - will call me back to tell me the name.   ROS: Constitutional: + weight oss, no fatigue, no subjective hyperthermia/hypothermia, + nocturia Eyes: no blurry vision, no xerophthalmia ENT: no sore throat, no nodules palpated in throat, no dysphagia/odynophagia, no hoarseness Cardiovascular: no CP/SOB/palpitations/leg swelling Respiratory: no cough/SOB/ + wheezing Gastrointestinal: no N/V/D/C Musculoskeletal: no muscle/+ joint aches - shoulder Skin: no rashes Neurological: no tremors/numbness/tingling/dizziness Low libido  PE: BP 124/68  Pulse 62  Temp(Src) 98.5 F (36.9 C) (Oral)  Resp 12  Wt 273 lb 8 oz (124.059 kg)  BMI 45.85 kg/m2  SpO2 95% Wt Readings from Last 3 Encounters:  08/06/13 273 lb 8 oz (124.059 kg)  07/15/13 276 lb (125.193 kg)  05/07/13 302 lb (136.986 kg)  Constitutional: obese, in NAD Eyes: PERRLA, EOMI, no exophthalmos ENT: moist mucous membranes, no thyromegaly, no cervical lymphadenopathy Cardiovascular: RRR, No MRG, + nonpitting edema lower legs Respiratory: CTA B Gastrointestinal: abdomen soft, NT, ND, BS+ Musculoskeletal: no deformities, strength intact in all 4 Skin: moist, warm, stasis dermatitis on legs Neurological: no tremor with outstretched hands, DTR normal in all   ASSESSMENT: 1. DM2, insulin-dependent, controlled, with complications - CKD stage 2-3 -  background DR  PLAN:  1.  Patient with long-standing diabetes, with very good control, except after her steroid injection. She lost 30 lbs since last visit, by improving her diet - introduced a smoothie b'fast, cut down meat, increased fruit and vegetables >> this gave her energy so now she also started to exercise consistently. She feels well and is very happy with her progress. I congratulated her! - I advised her to: Patient Instructions  Stop Lantus. Please start Metformin 500 mg with dinner x 4 days. If you tolerate this well, add another Metformin tablet (500 mg) with breakfast x 4 days. If you tolerate this well, add another metformin tablet with dinner (total 1000 mg) x 4 days. If you tolerate this well, add another metformin tablets with breakfast (total 1000 mg). Continue with 1000 mg of metformin twice a day with breakfast and dinner. Continue Amaryl 2 mg daily for now. Continue Tradjenta 5 mg daily.  Please return in 3 months with your sugar log.  - continue to check sugars 2-3 a day (can also check twice if she prefers), rotating checks times - will check a hemoglobin A1c in 2 days (3 mo interval). No ACR needed since on ACE inhibitor. - refilled strips  - got the flu vaccine yesterday - I will see her back in 3 months with her sugar log    Lab Results  Component Value Date   HGBA1C 7.6* 08/09/2013   HbA1c increased >> cont. with above plan.

## 2013-08-06 NOTE — Patient Instructions (Signed)
Stop Lantus. Please start Metformin 500 mg with dinner x 4 days. If you tolerate this well, add another Metformin tablet (500 mg) with breakfast x 4 days. If you tolerate this well, add another metformin tablet with dinner (total 1000 mg) x 4 days. If you tolerate this well, add another metformin tablets with breakfast (total 1000 mg). Continue with 1000 mg of metformin twice a day with breakfast and dinner. Continue Amaryl 2 mg daily for now. Continue Tradjenta 5 mg daily.  Please return in 3 months with your sugar log.

## 2013-08-07 MED ORDER — BLOOD GLUCOSE TEST VI STRP: ORAL_STRIP | Status: DC

## 2013-08-09 ENCOUNTER — Other Ambulatory Visit: Payer: Self-pay | Admitting: Internal Medicine

## 2013-08-09 LAB — HEMOGLOBIN A1C
Hgb A1c MFr Bld: 7.6 % — ABNORMAL HIGH (ref <5.7)
Mean Plasma Glucose: 171 mg/dL — ABNORMAL HIGH (ref <117)

## 2013-09-02 ENCOUNTER — Other Ambulatory Visit: Payer: Self-pay | Admitting: *Deleted

## 2013-09-02 MED ORDER — LINAGLIPTIN 5 MG PO TABS: 5.0000 mg | ORAL_TABLET | Freq: Every morning | ORAL | Status: DC

## 2013-09-17 ENCOUNTER — Ambulatory Visit (INDEPENDENT_AMBULATORY_CARE_PROVIDER_SITE_OTHER): Payer: BC Managed Care – PPO | Admitting: Ophthalmology

## 2013-09-17 DIAGNOSIS — H43819 Vitreous degeneration, unspecified eye: Secondary | ICD-10-CM

## 2013-09-17 DIAGNOSIS — H2510 Age-related nuclear cataract, unspecified eye: Secondary | ICD-10-CM

## 2013-09-17 DIAGNOSIS — E11319 Type 2 diabetes mellitus with unspecified diabetic retinopathy without macular edema: Secondary | ICD-10-CM

## 2013-09-17 DIAGNOSIS — I10 Essential (primary) hypertension: Secondary | ICD-10-CM

## 2013-09-17 DIAGNOSIS — E1139 Type 2 diabetes mellitus with other diabetic ophthalmic complication: Secondary | ICD-10-CM

## 2013-09-17 DIAGNOSIS — H35039 Hypertensive retinopathy, unspecified eye: Secondary | ICD-10-CM

## 2013-09-23 HISTORY — PX: SHOULDER ARTHROSCOPY W/ ROTATOR CUFF REPAIR: SHX2400

## 2013-09-24 ENCOUNTER — Telehealth: Payer: Self-pay | Admitting: Family Medicine

## 2013-09-24 NOTE — Telephone Encounter (Signed)
Pt had rotor cuff surgery done in [place] , [place]. Pt states she need fup in one week. No  30 min appt/ pls advise

## 2013-09-26 NOTE — Telephone Encounter (Signed)
Set her up for the next 30 min slot you can create. It is okay if this takes longer than one week after the surgery

## 2013-10-02 ENCOUNTER — Ambulatory Visit (INDEPENDENT_AMBULATORY_CARE_PROVIDER_SITE_OTHER): Payer: BC Managed Care – PPO | Admitting: Family Medicine

## 2013-10-02 ENCOUNTER — Encounter: Payer: Self-pay | Admitting: Family Medicine

## 2013-10-02 VITALS — BP 136/72 | HR 75 | Temp 98.4°F | Wt 267.0 lb

## 2013-10-02 DIAGNOSIS — I10 Essential (primary) hypertension: Secondary | ICD-10-CM

## 2013-10-02 DIAGNOSIS — M25512 Pain in left shoulder: Secondary | ICD-10-CM

## 2013-10-02 DIAGNOSIS — M25519 Pain in unspecified shoulder: Secondary | ICD-10-CM

## 2013-10-02 DIAGNOSIS — E119 Type 2 diabetes mellitus without complications: Secondary | ICD-10-CM

## 2013-10-02 NOTE — Progress Notes (Signed)
Pre visit review using our clinic review tool, if applicable. No additional management support is needed unless otherwise documented below in the visit note. 

## 2013-10-02 NOTE — Progress Notes (Signed)
   Subjective:    Patient ID: Krista Benjamin, female    DOB: [birth year], 62 y.o.   MRN: [id number]  HPI Here to follow up after a shoulder arthroscopy in [place] on 09-23-13. This went very well and she is feeling fine other than some shoulder pain. She has not started any rehab yet. She is to see the orthopedist for follow up on 10-14-13. Her BP and glucoses have been stable. Dr. Elvera Lennox had wanted her to increase the Metformin to 1000 mg but thisdropped her glucoses too low, so she has dropped this back to 500 mg.    Review of Systems  Constitutional: Negative.   Respiratory: Negative.   Cardiovascular: Negative.   Neurological: Negative.        Objective:   Physical Exam  Constitutional: She is oriented to person, place, and time. She appears well-developed and well-nourished.  Cardiovascular: Normal rate, regular rhythm, normal heart sounds and intact distal pulses.   Pulmonary/Chest: Effort normal and breath sounds normal.  Neurological: She is alert and oriented to person, place, and time.          Assessment & Plan:  She seems to be doing well. She will see her orthopedist as above and she will see Dr. Elvera Lennox on 11-07-13.

## 2013-10-23 ENCOUNTER — Other Ambulatory Visit: Payer: Self-pay | Admitting: *Deleted

## 2013-10-23 MED ORDER — GLUCOSE BLOOD VI STRP: ORAL_STRIP | Status: DC

## 2013-10-23 MED ORDER — LINAGLIPTIN 5 MG PO TABS: 5.0000 mg | ORAL_TABLET | Freq: Every morning | ORAL | Status: DC

## 2013-10-23 MED ORDER — GLIMEPIRIDE 4 MG PO TABS: ORAL_TABLET | ORAL | Status: DC

## 2013-10-27 ENCOUNTER — Telehealth: Payer: Self-pay | Admitting: Family Medicine

## 2013-10-27 NOTE — Telephone Encounter (Signed)
[facility] requesting 90 day supply refills for the following:  quinapril (ACCUPRIL) 20 MG tablet diltiazem (DILACOR XR) 240 MG 24 hr capsule metoprolol (LOPRESSOR) 50 MG tablet bumetanide (BUMEX) 1 MG tablet atorvastatin (LIPITOR) 40 MG tablet Ferrex 28 tabs 28's

## 2013-10-28 ENCOUNTER — Other Ambulatory Visit: Payer: Self-pay | Admitting: *Deleted

## 2013-10-28 MED ORDER — BUMETANIDE 1 MG PO TABS: 2.0000 mg | ORAL_TABLET | Freq: Every day | ORAL | Status: DC

## 2013-10-28 MED ORDER — GLIMEPIRIDE 4 MG PO TABS: ORAL_TABLET | ORAL | Status: DC

## 2013-10-28 MED ORDER — ATORVASTATIN CALCIUM 40 MG PO TABS: 40.0000 mg | ORAL_TABLET | Freq: Every day | ORAL | Status: DC

## 2013-10-28 MED ORDER — POLYSACCHARIDE IRON COMPLEX 150 MG PO CAPS: 150.0000 mg | ORAL_CAPSULE | Freq: Every day | ORAL | Status: DC

## 2013-10-28 MED ORDER — GLUCOSE BLOOD VI STRP: ORAL_STRIP | Status: DC

## 2013-10-28 MED ORDER — DILTIAZEM HCL ER 240 MG PO CP24: 240.0000 mg | ORAL_CAPSULE | Freq: Every day | ORAL | Status: DC

## 2013-10-28 MED ORDER — OSELTAMIVIR PHOSPHATE 75 MG PO CAPS: 75.0000 mg | ORAL_CAPSULE | Freq: Two times a day (BID) | ORAL | Status: DC

## 2013-10-28 MED ORDER — LINAGLIPTIN 5 MG PO TABS: 5.0000 mg | ORAL_TABLET | Freq: Every morning | ORAL | Status: DC

## 2013-10-28 MED ORDER — METOPROLOL TARTRATE 50 MG PO TABS: 50.0000 mg | ORAL_TABLET | Freq: Every day | ORAL | Status: DC

## 2013-10-28 MED ORDER — QUINAPRIL HCL 20 MG PO TABS: 20.0000 mg | ORAL_TABLET | Freq: Every day | ORAL | Status: DC

## 2013-10-28 NOTE — Addendum Note (Signed)
Addended by: Aniceto Boss A on: 10/28/2013 02:29 PM   Modules accepted: Orders

## 2013-10-28 NOTE — Telephone Encounter (Signed)
She has 2 days of a dry cough and some fever. Three other family members have flu-like sx

## 2013-10-28 NOTE — Telephone Encounter (Signed)
I sent all 6 scripts e-scribe and Dr. Clent Ridges sent in a script for Tamiflu and I spoke with Krista Benjamin.

## 2013-10-28 NOTE — Telephone Encounter (Signed)
Pt had to update her insurance information for [facility]. Need to resend refills to [facility].

## 2013-10-29 ENCOUNTER — Telehealth: Payer: Self-pay | Admitting: Family Medicine

## 2013-10-29 NOTE — Telephone Encounter (Signed)
Niferex 150 mg capsules have been discontinued by the manufacturer, a possible alternative is Ferrex 150 plus capsules, can we change to this and send to [facility]?

## 2013-10-31 ENCOUNTER — Telehealth: Payer: Self-pay | Admitting: Family Medicine

## 2013-10-31 MED ORDER — FERREX 150 PLUS 150-50-50 MG PO CAPS: 1.0000 | ORAL_CAPSULE | Freq: Every day | ORAL | Status: DC

## 2013-10-31 NOTE — Telephone Encounter (Signed)
I spoke with pt  

## 2013-10-31 NOTE — Telephone Encounter (Signed)
Pt returned you call.  She wasn't clear about the RX sent to [facility].

## 2013-10-31 NOTE — Telephone Encounter (Signed)
I sent new script and left a voice message for pt.

## 2013-10-31 NOTE — Telephone Encounter (Signed)
Please switch to [facility] and send in one year supply

## 2013-11-07 ENCOUNTER — Encounter: Payer: Self-pay | Admitting: Internal Medicine

## 2013-11-07 ENCOUNTER — Ambulatory Visit (INDEPENDENT_AMBULATORY_CARE_PROVIDER_SITE_OTHER): Payer: BC Managed Care – PPO | Admitting: Internal Medicine

## 2013-11-07 VITALS — BP 132/68 | HR 67 | Temp 98.4°F | Resp 12 | Wt 253.0 lb

## 2013-11-07 DIAGNOSIS — E119 Type 2 diabetes mellitus without complications: Secondary | ICD-10-CM

## 2013-11-07 MED ORDER — GLIMEPIRIDE 4 MG PO TABS: ORAL_TABLET | ORAL | Status: DC

## 2013-11-07 NOTE — Progress Notes (Signed)
Patient ID: Krista Benjamin, female   DOB: 07/08/1951, 63 y.o.   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    She had carpal tunnel Sx left arm 09/23/2014 >> cannot move the fingers >> had NCT today >> results pending.  Last hemoglobin A1c was:  Lab Results  Component Value Date   HGBA1C 7.6* 08/09/2013   HGBA1C 6.5 05/07/2013   HGBA1C 7.9* 11/21/2007  Prev. 5.9% on 12/2012, previously 6.1%.  Pt is on a regimen of: We stopped Lantus 10 units qhs.- We started Metformin 1000 mg bid >> stopped b/c Nausea/Diarrhea She uses Novolog 8 units tid ac, when sugars >140  - Amaryl 2 mg daily - Tradjenta 5 mg daily We stopped Actos 45 mg (was on it since 07/2001) Metformin 1000 mg po bid >> did not take in the past, as she had mild CKD.  Pt checks her sugars 2x day and they are: - am: 74-100 >> 90-120 >> 95-130 - lunch: 99-130 - dinner >> 130-150 - bedtime: 70-100 >> 65-75 >> 180-220 Lowest CBG 65; she has hypoglycemia awareness at 70. Highest sugar was 300s right after the steroid inj. She does not plan to have a new one.   Pt's meals are now: - Breakfast: was skipping, but now smoothies - Lunch: meat + veggies  - Dinner: meat + veggies - Snacks: 1-2, fruit, yoghurt Pt cut down meats and started to eat only meat and vegetables >> sugars improved >> feels much better, has more energy, swelling in legs better.  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 Latest ACR was 9.4 on 12/2012.  - last lipid profile: Lab Results  Component Value Date   CHOL 148 07/15/2013   HDL 41.90 07/15/2013   LDLCALC 94 07/15/2013   TRIG 63.0 07/15/2013   CHOLHDL 4 07/15/2013  She is on  Pravastatin. - last eye exam was in 04/2013 by Dr. Ashley RoyaltyMatthews: Background diabetic retinopathy; had cataract sx last summer. - no numbness and tingling in her legs  She also has a history of hypertension, hyperlipidemia-on Lipitor, iron deficiency anemia-on iron therapy, aortic valve disease/murmur, left ventricular hypertrophy per EKG, obesity, multinodular goiter, fibroids. Last TSH: Lab Results  Component Value Date   TSH 1.01 07/15/2013   I reviewed pt's medications, allergies, PMH, social hx, family hx and no changes required, except as mentioned above. She started Oxycodone for left wrist pain.  ROS: Constitutional: + weight loss, + decreased appetite, no fatigue, no subjective hyperthermia/hypothermia, + poor sleep Eyes: no blurry vision, no xerophthalmia ENT: no sore throat, no nodules palpated in throat, no dysphagia/odynophagia, no hoarseness Cardiovascular: no CP/SOB/palpitations/leg swelling Respiratory: +cough/+ SOB/+ wheezing Gastrointestinal: + all: N/V/D/C Musculoskeletal: no muscle/joint aches Skin: no rashes Neurological: no tremors/numbness/tingling/dizziness  PE: BP 132/68  Pulse 67  Temp(Src) 98.4 F (36.9 C) (Oral)  Resp 12  Wt 253 lb (114.76 kg)  SpO2 97% Wt Readings from Last 3 Encounters:  11/07/13 253 lb (114.76 kg)  10/02/13 267 lb (121.11 kg)  08/06/13 273 lb 8 oz (124.059 kg)   Constitutional: obese, in NAD Eyes: PERRLA, EOMI, no exophthalmos ENT: moist mucous membranes, no thyromegaly, no cervical lymphadenopathy Cardiovascular: RRR, No MRG, +  nonpitting edema lower legs Respiratory: CTA B Gastrointestinal: abdomen soft, NT, ND, BS+ Musculoskeletal: no deformities, left forearm in sling Skin: moist, warm, stasis dermatitis on legs  ASSESSMENT: 1. DM2, insulin-dependent, controlled, with complications - CKD stage 2-3 - background DR  PLAN:  1.  Patient with long-standing diabetes, with good control, except after her steroid injections. No  recent steroid inj. She continues to lose weight after she improved her diet. We stopped the Lantus and started Metformin, but, unfortunately, she could not tolerate it 2/2 GI sxs. She did not restart Lantus. Sugars are not far from target, but higher at night. - I advised her to: Patient Instructions  Increase Amaryl 4 mg daily in am. Continue Tradjenta 5 mg daily. Come back in few days for a HbA1c. Please come back for a follow-up appointment in 3 months. - continue to check sugars 2x a day, rotating checks times - given more sugar logs - will check a hemoglobin A1c in few days (3 mo interval). No ACR needed since on ACE inhibitor. - got the flu vaccine this season - I will see her back in 3 months with her sugar log    Lab Results  Component Value Date   HGBA1C 7.1* 11/12/2013  Improved A1c.

## 2013-11-07 NOTE — Patient Instructions (Signed)
Increase Amaryl 4 mg daily in am. Continue Tradjenta 5 mg daily. Come back in few days for a HbA1c. Please come back for a follow-up appointment in 3 months.

## 2013-11-12 ENCOUNTER — Other Ambulatory Visit (INDEPENDENT_AMBULATORY_CARE_PROVIDER_SITE_OTHER): Payer: BC Managed Care – PPO

## 2013-11-12 DIAGNOSIS — E119 Type 2 diabetes mellitus without complications: Secondary | ICD-10-CM

## 2013-11-12 LAB — HEMOGLOBIN A1C: Hgb A1c MFr Bld: 7.1 % — ABNORMAL HIGH (ref 4.6–6.5)

## 2013-11-21 ENCOUNTER — Telehealth: Payer: Self-pay | Admitting: Family Medicine

## 2013-11-21 NOTE — Telephone Encounter (Signed)
Pt states the iron medication(pt unsure of the name)she is currently taking is no longer covered by her insurance.  Pt states she requested an alternative medication.  Pt states she was told the alternative med she was given is not compatible to the medication she has been taking.  She is requesting a compatible alternate medication.

## 2013-11-21 NOTE — Telephone Encounter (Signed)
I spoke with [facility] and this medication is no longer covered by insurance. I left a voice message for pt to find out exactly what medication is covered and call us back. I have sent in 2 different scripts for pt and neither are covered.

## 2013-11-22 ENCOUNTER — Telehealth: Payer: Self-pay | Admitting: Family Medicine

## 2013-11-22 MED ORDER — FERREX 28 PO TABS: 1.0000 | ORAL_TABLET | Freq: Every day | ORAL | Status: DC

## 2013-11-22 NOTE — Telephone Encounter (Signed)
New script for Ferrex 28 and this came from [facility]. Per Dr. Clent Ridges okay to send this in. I did send script e-scribe and spoke with pt.

## 2013-12-19 ENCOUNTER — Telehealth: Payer: Self-pay | Admitting: Family Medicine

## 2013-12-19 NOTE — Telephone Encounter (Signed)
Pt would like to know if she needs to come see you. Pt may have surgery on her hand, and the last time she had surgery, her O2 dropped. Pt has not had any xrays of hand yet, waiting for [facility] to call.  pls advise

## 2013-12-20 NOTE — Telephone Encounter (Signed)
No I don't think she needs to see [place]. Just have her speak to the anesthesia team about what happened with her oxygen last time.

## 2013-12-20 NOTE — Telephone Encounter (Signed)
I spoke with pt  

## 2014-01-22 HISTORY — PX: CARPAL TUNNEL RELEASE: SHX101

## 2014-01-26 ENCOUNTER — Emergency Department (INDEPENDENT_AMBULATORY_CARE_PROVIDER_SITE_OTHER)
Admission: EM | Admit: 2014-01-26 | Discharge: 2014-01-26 | Disposition: A | Discharge status: Home / Self Care | Payer: BC Managed Care – PPO | Source: Home / Self Care | Attending: Emergency Medicine | Admitting: Emergency Medicine

## 2014-01-26 ENCOUNTER — Encounter (HOSPITAL_COMMUNITY): Payer: Self-pay | Admitting: Emergency Medicine

## 2014-01-26 DIAGNOSIS — X58XXXA Exposure to other specified factors, initial encounter: Secondary | ICD-10-CM

## 2014-01-26 DIAGNOSIS — IMO0002 Reserved for concepts with insufficient information to code with codable children: Secondary | ICD-10-CM

## 2014-01-26 NOTE — ED Provider Notes (Signed)
CSN: [id number]     Arrival date & time 01/26/14  4098 History   First MD Initiated Contact with Patient 01/26/14 0935     Chief Complaint  Patient presents with  . Hand Pain   (Consider location/radiation/quality/duration/timing/severity/associated sxs/prior Treatment)  HPI  The patient is a 63 year old female sent in today following a carpal time release surgery this past Wednesday by Dr. Madilyn Fireman. Patient states that she noticed some bruising and swelling in the palm of her hand yesterday however decided to come in today if it did not improve. Patient has been given oxycodone to take for pain control and states discomfort is under control.  In addition the patient has a history of rotator cuff repair to the left shoulder. 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 States has been in physical therapy for her hand since then.  Past Medical History  Diagnosis Date  . Hyperlipidemia   . Anemia   . Cataract   . Hypertension     sees Dr. Angelina Sheriff  . Gynecological examination     sees Dr. Jennette Kettle   . Diabetes mellitus     sees Dr. Elvera Lennox    Past Surgical History  Procedure Laterality Date  . Excision of benign cyst      from left maxilla and left maxillary sinus  . Colonoscopy  11-26-07    per Dr. Jarold Motto, repeat in 10 yrs   . Knee surgery    . Shoulder arthroscopy w/ rotator cuff repair Left 09-23-13    per Dr. Delrae Sawyers at [facility] in [place]    Family History  Problem Relation Age of Onset  . Coronary artery disease Mother   . Coronary artery disease Brother   . Cancer      fhx  . Diabetes      fhx  . Hyperlipidemia      fhx  . Hypertension      fhx  . Stroke      fhx   History  Substance Use Topics  . Smoking status: Never Smoker   . Smokeless tobacco: Never Used  . Alcohol Use: No   OB History   Grav Para Term Preterm Abortions TAB SAB Ect Mult Living                 Review of Systems  Constitutional: Negative.  Negative  for fever, chills and fatigue.  HENT: Negative.   Eyes: Negative.   Respiratory: Negative.   Cardiovascular: Negative.   Gastrointestinal: Negative.   Endocrine: Negative.        Patient has a history of well-controlled diabetes she is on glyburide/metformin treatment.  Genitourinary: Negative.   Musculoskeletal: Positive for joint swelling.  Allergic/Immunologic: Negative.   Neurological: Negative.        History of carpal tunnel syndrome. The patient had decreased range of motion to left hand prior to surgery.  Hematological: Negative.   Psychiatric/Behavioral: Negative.     Allergies  Review of patient's allergies indicates no known allergies.  Home Medications   Prior to Admission medications   Medication Sig Start Date End Date Taking? Authorizing Provider  albuterol (PROVENTIL HFA;VENTOLIN HFA) 108 (90 BASE) MCG/ACT inhaler Inhale 2 puffs into the lungs every 4 (four) hours as needed for wheezing. 03/11/13   Nelwyn Salisbury, MD  aspirin 81 MG tablet Take 81 mg by mouth at bedtime.     Historical Provider, MD  atorvastatin (LIPITOR) 40 MG tablet Take 1 tablet (40 mg  total) by mouth at bedtime. 10/28/13   Nelwyn Salisbury, MD  bumetanide (BUMEX) 1 MG tablet Take 2 tablets (2 mg total) by mouth at bedtime. 10/28/13   Nelwyn Salisbury, MD  cholecalciferol (VITAMIN D) 1000 UNITS tablet Take 2,000 Units by mouth at bedtime.     Historical Provider, MD  diltiazem (DILACOR XR) 240 MG 24 hr capsule Take 1 capsule (240 mg total) by mouth daily. 10/28/13   Nelwyn Salisbury, MD  FeAspGl-FeFum-[id number]-Succ Ac [facility] 28) TABS Take 1 tablet by mouth daily. 11/22/13   Nelwyn Salisbury, MD  glimepiride (AMARYL) 4 MG tablet Take 1 tablet every day in am 11/07/13   Carlus Pavlov, MD  Glucose Blood (BLOOD GLUCOSE TEST STRIPS) STRP Check 3x a day 08/06/13   Carlus Pavlov, MD  glucose blood (ONE TOUCH ULTRA TEST) test strip Test blood sugar 3 times a day as instructed. One Touch Ultra Strip Blue. 10/28/13    Carlus Pavlov, MD  insulin aspart (NOVOLOG) 100 UNIT/ML injection Inject 8 Units into the skin as needed for high blood sugar. Before lunch    Historical Provider, MD  linagliptin (TRADJENTA) 5 MG TABS tablet Take 1 tablet (5 mg total) by mouth every morning. 10/28/13   Carlus Pavlov, MD  metFORMIN (GLUCOPHAGE) 500 MG tablet Take 500 mg by mouth 2 (two) times daily with a meal. As needed 08/06/13   Carlus Pavlov, MD  metoprolol (LOPRESSOR) 50 MG tablet Take 1 tablet (50 mg total) by mouth daily. 10/28/13   Nelwyn Salisbury, MD  oseltamivir (TAMIFLU) 75 MG capsule Take 1 capsule (75 mg total) by mouth 2 (two) times daily. 10/28/13   Nelwyn Salisbury, MD  quinapril (ACCUPRIL) 20 MG tablet Take 1 tablet (20 mg total) by mouth at bedtime. 10/28/13   Nelwyn Salisbury, MD   BP 169/77  Pulse 60  Temp(Src) 97.8 F (36.6 C) (Oral)  SpO2 98%  Physical Exam  Nursing note and vitals reviewed. Constitutional: She is oriented to person, place, and time. She appears well-developed and well-nourished. No distress.  Cardiovascular: Normal rate, regular rhythm, normal heart sounds and intact distal pulses.  Exam reveals no gallop and no friction rub.   No murmur heard. Pulmonary/Chest: Effort normal and breath sounds normal. No respiratory distress. She has no wheezes. She has no rales. She exhibits no tenderness.  Musculoskeletal: She exhibits edema and tenderness.       Left wrist: She exhibits tenderness and swelling. She exhibits normal range of motion and no deformity.  The patient has well approximated healing surgical incision over anterior aspect of left wrist. Steri-Strip closure still in place. Patient has decreased range of motion of phalanges with flexion and extension of left hand. The patient states decreased range of motion was present prior to surgery. There is edema and bruising noted over the thenar eminence of the left palm. Color movement and sensation to distal phalanges intact.Cap refill less  than 3 seconds  Neurological: She is alert and oriented to person, place, and time.  Skin: Skin is warm and dry. She is not diaphoretic.    ED Course  Procedures (including critical care time) Labs Review Labs Reviewed - No data to display  Results for orders placed in visit on 11/12/13  HEMOGLOBIN A1C      Result Value Ref Range   Hemoglobin A1C 7.1 (*) 4.6 - 6.5 %   Imaging Review No results found.   MDM   1. Postoperative hematoma  Discussed the importance of notifying the operating surgeon if any difficulties or future complaints. In addition advised patient to return immediately to urgent care or emergency department with significant change in sensation, color, movement, or warmth of left hand. The patient verbalizes understanding and agrees to plan of care.       Weber Cooksatherine Tyarra Nolton, NP 01/26/14 1149

## 2014-01-26 NOTE — ED Notes (Signed)
Patient states had carpel tunnel surgery this past wed. Yesterday the palm of her left hand started to bruise and the patient Became concerned

## 2014-01-26 NOTE — Discharge Instructions (Signed)
Follow up with your surgeon's office on Monday.  If symptoms worsen, hand becomes discolored or there is a loss of sensation return immediately for evaluation.

## 2014-01-27 NOTE — ED Provider Notes (Signed)
Medical screening examination/treatment/procedure(s) were performed by non-physician practitioner and as supervising physician I was immediately available for consultation/collaboration.  Tristain Daily, M.D.  Overton Boggus C Romar Woodrick, MD 01/27/14 1049 

## 2014-02-11 ENCOUNTER — Ambulatory Visit (INDEPENDENT_AMBULATORY_CARE_PROVIDER_SITE_OTHER): Payer: BC Managed Care – PPO | Admitting: Internal Medicine

## 2014-02-11 ENCOUNTER — Encounter: Payer: Self-pay | Admitting: Internal Medicine

## 2014-02-11 VITALS — BP 122/66 | HR 65 | Temp 98.3°F | Resp 12 | Wt 246.0 lb

## 2014-02-11 DIAGNOSIS — E119 Type 2 diabetes mellitus without complications: Secondary | ICD-10-CM

## 2014-02-11 LAB — HEMOGLOBIN A1C: Hgb A1c MFr Bld: 7.6 % — ABNORMAL HIGH (ref 4.6–6.5)

## 2014-02-11 NOTE — Progress Notes (Signed)
Patient ID: Krista Benjamin, female   DOB: 07/03/1951, 63 y.o.   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  Last visit 3 mo ago.  She had carpal tunnel sx 3 weeks ago. Sugars higher after the sx.  Last hemoglobin A1c was:  Lab Results  Component Value Date   HGBA1C 7.1* 11/12/2013   HGBA1C 7.6* 08/09/2013   HGBA1C 6.5 05/07/2013  Prev. 5.9% on 12/2012, previously 6.1%.  Pt is on a regimen of: - Amaryl 2 >> 4 mg daily - Tradjenta 5 mg daily We stopped Lantus 10 units qhs. We stopped Actos 45 mg (was on it since 07/2001) Metformin 1000 mg bid >> stopped b/c Nausea/Diarrhea Metformin 1000 mg po bid >> did not take in the past, as she had mild CKD. She was on Novolog 8 units tid ac, when sugars >140   Pt checks her sugars 2x day and they are: - am: 74-100 >> 90-120 >> 95-130 >> 80s-120 (occas. 140, 198) - 2h after b'fast: n/c as not eating b'fast - lunch: 99-130 >> 100-180 (238)  - dinner >> 130-150 >> 120-170 - 2h after dinner: 115-197 (211) - bedtime: 70-100 >> 65-75 >> 180-220 >> 136-242 Lowest CBG: 80s; she has hypoglycemia awareness at 70. Highest sugar was 200s.  Pt's meals are now: - Breakfast: was skipping, but now smoothies - Lunch: meat + veggies  - Dinner: meat + veggies - Snacks: 1-2, fruit, yoghurt Pt cut down meats and started to eat only meat and vegetables >> sugars improved >> feels much better, has more energy, swelling in legs better.  - has CKD stage 2-3. Last BUN/creatinine was:  Lab Results  Component Value Date   BUN 27* 07/15/2013   Lab Results  Component Value Date   CREATININE 0.9 07/15/2013  Previously 38/1.23-GFR 53; she is on quinapril. Latest ACR was 9.4 on 12/2012.  - last lipid profile: Lab Results  Component Value Date   CHOL 148 07/15/2013   HDL 41.90 07/15/2013   LDLCALC 94 07/15/2013   TRIG 63.0 07/15/2013   CHOLHDL 4 07/15/2013  She is on Pravastatin. - last eye exam was in 04/2013 by Dr. Ashley RoyaltyMatthews: Background diabetic retinopathy; had cataract sx in 2014. - no numbness and tingling in her legs  She also has a history of hypertension, hyperlipidemia-on Lipitor, iron deficiency anemia-on iron therapy, aortic valve disease/murmur, left ventricular hypertrophy per EKG, obesity, multinodular goiter, fibroids. Last TSH: Lab Results  Component Value Date   TSH 1.01 07/15/2013   I reviewed pt's medications, allergies, PMH, social hx, family hx and no changes required, except as mentioned above. She started Oxycodone for left wrist pain.  ROS: Constitutional: + weight loss, + decreased appetite, no fatigue, no subjective hyperthermia/hypothermia Eyes: no blurry vision, no xerophthalmia ENT: no sore throat, no nodules palpated in throat, no dysphagia/odynophagia, no hoarseness Cardiovascular: no CP/SOB/palpitations/leg swelling Respiratory: no cough/SOB/wheezing Gastrointestinal: no N/V/D/C Musculoskeletal: no muscle/joint aches Skin: no rashes Neurological: no tremors/numbness/tingling/dizziness  PE: BP 122/66  Pulse 65  Temp(Src) 98.3 F (36.8 C) (Oral)  Resp 12  Wt 246 lb (111.585 kg)  SpO2 96% Body mass index is 41.24 kg/(m^2).  Wt Readings from Last 3 Encounters:  02/11/14 246 lb (111.585 kg)  11/07/13 253 lb (114.76 kg)  10/02/13 267 lb (121.11 kg)   Constitutional: obese, in NAD Eyes: PERRLA, EOMI, no exophthalmos ENT: moist mucous membranes,  no thyromegaly, no cervical lymphadenopathy Cardiovascular: RRR, No MRG, + nonpitting edema lower legs Respiratory: CTA B Gastrointestinal: abdomen soft, NT, ND, BS+ Musculoskeletal: no deformities, left forearm in sling Skin: moist, warm, stasis dermatitis on legs  ASSESSMENT: 1. DM2, non-insulin-dependent, uncontrolled, with complications - CKD stage 2-3 - background DR  PLAN:  1.  Patient with long-standing  diabetes, with good control, except after steroid injections. No recent steroid inj., but she had carpal tunnel Sx 3 weeks ago and sugars are higher after this. She continues to lose weight after she improved her diet. She lost 80 lbs since 2012! And 20 lbs in last 5 mo! She is off insulin completely. Sugars higher at night. - I advised her to:   Patient Instructions  Please continue Tradjenta 5 mg in am. Split Amaryl to 2 mg in am and 2 mg before dinner. Please stop at the lab. Please return in 3 month with your sugar log.   - continue to check sugars 2x a day, rotating checks times - given more sugar logs - will check a hemoglobin A1c today. No ACR needed since on ACE inhibitor. - I will see her back in 3 months with her sugar log    Office Visit on 02/11/2014  Component Date Value Ref Range Status  . Hemoglobin A1C 02/11/2014 7.6* 4.6 - 6.5 % Final   Glycemic Control Guidelines for People with Diabetes:Non Diabetic:  <6%Goal of Therapy: <7%Additional Action Suggested:  >8%    HbA1c increased - may need to intensify tx at next visit.

## 2014-02-11 NOTE — Patient Instructions (Signed)
Please continue Tradjenta 5 mg in am. Split Amaryl to 2 mg in am and 2 mg before dinner.  Please stop at the lab.  Please return in 3 month with your sugar log.

## 2014-02-13 ENCOUNTER — Telehealth: Payer: Self-pay | Admitting: Family Medicine

## 2014-02-13 NOTE — Telephone Encounter (Signed)
Pt would like to see Dr Clent Ridges about a rash may I use the SDA slot for Friday 02/14/14 11am

## 2014-02-13 NOTE — Telephone Encounter (Signed)
She needs an OV for this  

## 2014-02-13 NOTE — Telephone Encounter (Signed)
I spoke with pt and she will schedule the office visit.

## 2014-02-13 NOTE — Telephone Encounter (Signed)
Pt states she has an rash due to the tramadol med that she is taking, states she has already been seen by dr. Clent Ridges before who informed her it was because of the medication and he provided her an antibiotic for it. Pt states the rash has come back and wants to know if dr.fry to provide her an rx for the antibiotic again. [facility] [address].

## 2014-02-14 ENCOUNTER — Encounter: Payer: Self-pay | Admitting: Family Medicine

## 2014-02-14 ENCOUNTER — Ambulatory Visit (INDEPENDENT_AMBULATORY_CARE_PROVIDER_SITE_OTHER): Payer: BC Managed Care – PPO | Admitting: Family Medicine

## 2014-02-14 VITALS — BP 140/74 | Temp 98.8°F | Ht 64.75 in | Wt 247.0 lb

## 2014-02-14 DIAGNOSIS — B356 Tinea cruris: Secondary | ICD-10-CM

## 2014-02-14 MED ORDER — FLUCONAZOLE 200 MG PO TABS: 200.0000 mg | ORAL_TABLET | Freq: Two times a day (BID) | ORAL | Status: DC

## 2014-02-14 NOTE — Telephone Encounter (Signed)
Pt is on schedule for this morning.

## 2014-02-14 NOTE — Progress Notes (Signed)
Pre visit review using our clinic review tool, if applicable. No additional management support is needed unless otherwise documented below in the visit note. 

## 2014-02-14 NOTE — Progress Notes (Signed)
   Subjective:    Patient ID: Krista Benjamin, female    DOB: [birth year], 63 y.o.   MRN: [id number]  HPI Here for 4 days of an itchy rash on the lower abdomen.    Review of Systems  Constitutional: Negative.   Skin: Positive for rash.       Objective:   Physical Exam  Constitutional: She appears well-developed and well-nourished.  Skin:  Band of macular erythema in both groin areas           Assessment & Plan:  Recheck prn.

## 2014-02-23 ENCOUNTER — Telehealth: Payer: Self-pay | Admitting: Internal Medicine

## 2014-02-24 ENCOUNTER — Other Ambulatory Visit: Payer: Self-pay | Admitting: *Deleted

## 2014-02-24 MED ORDER — BLOOD GLUCOSE TEST VI STRP: ORAL_STRIP | Status: DC

## 2014-02-24 MED ORDER — GLIMEPIRIDE 4 MG PO TABS: 2.0000 mg | ORAL_TABLET | Freq: Two times a day (BID) | ORAL | Status: DC

## 2014-02-24 NOTE — Telephone Encounter (Signed)
Pt also needs the verio test strips for the meter called into [facility] [address] [phone]

## 2014-05-06 ENCOUNTER — Emergency Department (HOSPITAL_COMMUNITY)
Admission: EM | Admit: 2014-05-06 | Discharge: 2014-05-07 | Disposition: A | Discharge status: Home / Self Care | Payer: BC Managed Care – PPO | Attending: Emergency Medicine | Admitting: Emergency Medicine

## 2014-05-06 ENCOUNTER — Encounter (HOSPITAL_COMMUNITY): Payer: Self-pay | Admitting: Emergency Medicine

## 2014-05-06 DIAGNOSIS — D649 Anemia, unspecified: Secondary | ICD-10-CM | POA: Insufficient documentation

## 2014-05-06 DIAGNOSIS — E785 Hyperlipidemia, unspecified: Secondary | ICD-10-CM | POA: Insufficient documentation

## 2014-05-06 DIAGNOSIS — T63481A Toxic effect of venom of other arthropod, accidental (unintentional), initial encounter: Secondary | ICD-10-CM

## 2014-05-06 DIAGNOSIS — T6391XA Toxic effect of contact with unspecified venomous animal, accidental (unintentional), initial encounter: Secondary | ICD-10-CM | POA: Insufficient documentation

## 2014-05-06 DIAGNOSIS — R42 Dizziness and giddiness: Secondary | ICD-10-CM | POA: Insufficient documentation

## 2014-05-06 DIAGNOSIS — T63461A Toxic effect of venom of wasps, accidental (unintentional), initial encounter: Secondary | ICD-10-CM | POA: Insufficient documentation

## 2014-05-06 DIAGNOSIS — Z79899 Other long term (current) drug therapy: Secondary | ICD-10-CM | POA: Insufficient documentation

## 2014-05-06 DIAGNOSIS — I10 Essential (primary) hypertension: Secondary | ICD-10-CM | POA: Insufficient documentation

## 2014-05-06 DIAGNOSIS — R0602 Shortness of breath: Secondary | ICD-10-CM | POA: Insufficient documentation

## 2014-05-06 DIAGNOSIS — Y929 Unspecified place or not applicable: Secondary | ICD-10-CM | POA: Insufficient documentation

## 2014-05-06 DIAGNOSIS — Y939 Activity, unspecified: Secondary | ICD-10-CM | POA: Insufficient documentation

## 2014-05-06 DIAGNOSIS — H269 Unspecified cataract: Secondary | ICD-10-CM | POA: Insufficient documentation

## 2014-05-06 DIAGNOSIS — E119 Type 2 diabetes mellitus without complications: Secondary | ICD-10-CM | POA: Insufficient documentation

## 2014-05-06 DIAGNOSIS — Z7982 Long term (current) use of aspirin: Secondary | ICD-10-CM | POA: Insufficient documentation

## 2014-05-06 LAB — COMPREHENSIVE METABOLIC PANEL
ALT: 21 U/L (ref 0–35)
AST: 20 U/L (ref 0–37)
Albumin: 4 g/dL (ref 3.5–5.2)
Alkaline Phosphatase: 101 U/L (ref 39–117)
Anion gap: 14 (ref 5–15)
BUN: 21 mg/dL (ref 6–23)
CO2: 24 mEq/L (ref 19–32)
Calcium: 9.8 mg/dL (ref 8.4–10.5)
Chloride: 100 mEq/L (ref 96–112)
Creatinine, Ser: 0.78 mg/dL (ref 0.50–1.10)
GFR calc Af Amer: >90 mL/min (ref >90)
GFR calc non Af Amer: 87 mL/min — ABNORMAL LOW (ref >90)
Glucose, Bld: 152 mg/dL — ABNORMAL HIGH (ref 70–99)
Potassium: 4.3 mEq/L (ref 3.7–5.3)
Sodium: 138 mEq/L (ref 137–147)
Total Bilirubin: 0.4 mg/dL (ref 0.3–1.2)
Total Protein: 8 g/dL (ref 6.0–8.3)

## 2014-05-06 LAB — CBC WITH DIFFERENTIAL/PLATELET
Basophils Absolute: 0 10*3/uL (ref 0.0–0.1)
Basophils Relative: 0 % (ref 0–1)
Eosinophils Absolute: 0.1 10*3/uL (ref 0.0–0.7)
Eosinophils Relative: 1 % (ref 0–5)
HCT: 39.2 % (ref 36.0–46.0)
Hemoglobin: 12.2 g/dL (ref 12.0–15.0)
Lymphocytes Relative: 32 % (ref 12–46)
Lymphs Abs: 3.2 10*3/uL (ref 0.7–4.0)
MCH: 25.8 pg — ABNORMAL LOW (ref 26.0–34.0)
MCHC: 31.1 g/dL (ref 30.0–36.0)
MCV: 83.1 fL (ref 78.0–100.0)
Monocytes Absolute: 0.7 10*3/uL (ref 0.1–1.0)
Monocytes Relative: 7 % (ref 3–12)
Neutro Abs: 6.1 10*3/uL (ref 1.7–7.7)
Neutrophils Relative %: 60 % (ref 43–77)
Platelets: 233 10*3/uL (ref 150–400)
RBC: 4.72 MIL/uL (ref 3.87–5.11)
RDW: 15.2 % (ref 11.5–15.5)
WBC: 10 10*3/uL (ref 4.0–10.5)

## 2014-05-06 LAB — I-STAT TROPONIN, ED: Troponin i, poc: 0 ng/mL (ref 0.00–0.08)

## 2014-05-06 MED ORDER — DIPHENHYDRAMINE HCL 25 MG PO CAPS
25.0000 mg | ORAL_CAPSULE | Freq: Once | ORAL | Status: AC
  Administered 2014-05-06: 25 mg via ORAL
  Filled 2014-05-06: qty 1

## 2014-05-06 NOTE — ED Notes (Signed)
Pt's family reports bee sting to R medial upper arm tonight.  States she became SOB and lightheaded after wards.  Reddened area noted on her R arm.  Pt denies any SOB at this time. Pt's family reports pt is "slower and not acting herself."

## 2014-05-06 NOTE — Discharge Instructions (Signed)

## 2014-05-06 NOTE — ED Provider Notes (Addendum)
TIME SEEN: 11:05 PM  CHIEF COMPLAINT: Bee sting  HPI: Patient is a 63 y.o. F with history of hypertension, diabetes, hyperlipidemia who presents to the emergency department after she was stung in the right upper arm today by bee around 7-8 PM. She states that she had some pain in this area and felt slightly short of breath and lightheaded immediately after the sting but this is now resolved without intervention. She denies any chest pain or chest discomfort. No fevers, cough, vomiting or diarrhea. No rash or hives.  ROS: See HPI Constitutional: no fever  Eyes: no drainage  ENT: no runny nose   Cardiovascular:  no chest pain  Resp: no SOB  GI: no vomiting GU: no dysuria Integumentary: no rash  Allergy: no hives  Musculoskeletal: no leg swelling  Neurological: no slurred speech ROS otherwise negative  PAST MEDICAL HISTORY/PAST SURGICAL HISTORY:  Past Medical History  Diagnosis Date  . Hyperlipidemia   . Anemia   . Cataract   . Hypertension     sees Dr. Angelina Sheriff  . Gynecological examination     sees Dr. Jennette Kettle   . Diabetes mellitus     sees Dr. Elvera Lennox     MEDICATIONS:  Prior to Admission medications   Medication Sig Start Date End Date Taking? Authorizing Provider  albuterol (PROVENTIL HFA;VENTOLIN HFA) 108 (90 BASE) MCG/ACT inhaler Inhale 2 puffs into the lungs every 4 (four) hours as needed for wheezing. 03/11/13  Yes Nelwyn Salisbury, MD  aspirin 81 MG tablet Take 81 mg by mouth at bedtime.    Yes Historical Provider, MD  atorvastatin (LIPITOR) 40 MG tablet Take 1 tablet (40 mg total) by mouth at bedtime. 10/28/13  Yes Nelwyn Salisbury, MD  bumetanide (BUMEX) 1 MG tablet Take 2 tablets (2 mg total) by mouth at bedtime. 10/28/13  Yes Nelwyn Salisbury, MD  cholecalciferol (VITAMIN D) 1000 UNITS tablet Take 2,000 Units by mouth at bedtime.    Yes Historical Provider, MD  diltiazem (DILACOR XR) 240 MG 24 hr capsule Take 1 capsule (240 mg total) by mouth daily. 10/28/13  Yes Nelwyn Salisbury,  MD  FeAspGl-FeFum-B12-FA-C-Succ Ac [facility] 28) TABS Take 1 tablet by mouth daily. 11/22/13  Yes Nelwyn Salisbury, MD  glimepiride (AMARYL) 4 MG tablet Take 0.5 tablets (2 mg total) by mouth 2 (two) times daily. 02/24/14  Yes Carlus Pavlov, MD  Glucose Blood (BLOOD GLUCOSE TEST STRIPS) STRP Check 3x a day 02/24/14  Yes Carlus Pavlov, MD  linagliptin (TRADJENTA) 5 MG TABS tablet Take 1 tablet (5 mg total) by mouth every morning. 10/28/13  Yes Carlus Pavlov, MD  metFORMIN (GLUCOPHAGE) 500 MG tablet Take 500 mg by mouth 2 (two) times daily as needed (for high blood sugar). As needed 08/06/13  Yes Carlus Pavlov, MD  metoprolol (LOPRESSOR) 50 MG tablet Take 1 tablet (50 mg total) by mouth daily. 10/28/13  Yes Nelwyn Salisbury, MD  quinapril (ACCUPRIL) 20 MG tablet Take 1 tablet (20 mg total) by mouth at bedtime. 10/28/13  Yes Nelwyn Salisbury, MD    ALLERGIES:  No Known Allergies  SOCIAL HISTORY:  History  Substance Use Topics  . Smoking status: Never Smoker   . Smokeless tobacco: Never Used  . Alcohol Use: No    FAMILY HISTORY: Family History  Problem Relation Age of Onset  . Coronary artery disease Mother   . Coronary artery disease Brother   . Cancer      fhx  . Diabetes  fhx  . Hyperlipidemia      fhx  . Hypertension      fhx  . Stroke      fhx    EXAM: BP 184/57  Pulse 73  Temp(Src) 98.7 F (37.1 C) (Oral)  Resp 20  SpO2 98% CONSTITUTIONAL: Alert and oriented and responds appropriately to questions. Well-appearing; well-nourished HEAD: Normocephalic EYES: Conjunctivae clear, PERRL ENT: normal nose; no rhinorrhea; moist mucous membranes; pharynx without lesions noted, no angioedema, tongue swelling, no changes in her voice, no trismus or drooling NECK: Supple, no meningismus, no LAD  CARD: RRR; S1 and S2 appreciated; no murmurs, no clicks, no rubs, no gallops RESP: Normal chest excursion without splinting or tachypnea; breath sounds clear and equal bilaterally; no  wheezes, no rhonchi, no rales, no hypoxia or respiratory distress, no stridor ABD/GI: Normal bowel sounds; non-distended; soft, non-tender, no rebound, no guarding BACK:  The back appears normal and is non-tender to palpation, there is no CVA tenderness EXT: Normal ROM in all joints; non-tender to palpation; no edema; normal capillary refill; no cyanosis    SKIN: Normal color for age and race; warm; patient has a small erythematous papule to the right medial upper arm with no surrounding induration or erythema or warmth, no drainage NEURO: Moves all extremities equally PSYCH: The patient's mood and manner are appropriate. Grooming and personal hygiene are appropriate.  MEDICAL DECISION MAKING: Patient here with shortness of breath and dizziness that occurred after an insect sting. She is now asymptomatic. Her EKG does show left bundle branch block there is no old for comparison. I am not concerned for acute MI given she is asymptomatic with this EKG. Basic labs have been drawn and are unremarkable. Given she does have risk factors for ACS and has a left bundle branch block with old for comparison, will draw one troponin. If negative, I feel she is safe to be discharged home. Have discussed return precautions and supportive care instructions. She verbalized understanding and is comfortable with plan.  ED PROGRESS: Troponin negative, this was drawn over 4 hours after onset of symptoms. She still asymptomatic and hemodynamically stable. She has a PCP for followup. We'll discharge.  Have discussed with her she may use Benadryl as needed. I do not think she needs to be on steroids given her symptoms resolved without intervention and she is well-appearing, no hives, angioedema, wheezing and also because she is a diabetic. I also do not feel she needs an EpiPen. There is no sign of infection at the site of the sting.    EKG Interpretation  Date/Time:  Tuesday May 06 2014 23:09:41 EDT Ventricular Rate:   60 PR Interval:  173 QRS Duration: 159 QT Interval:  471 QTC Calculation: 471 R Axis:   -49 Text Interpretation:  Sinus rhythm Probable left atrial enlargement Left bundle branch block No old for comparison Confirmed by WARD,  DO, KRISTEN [phone]([id number]) on 05/06/2014 11:13:14 PM        Layla MawKristen N Ward, DO 05/06/14 2345  Kristen N Ward, DO 05/06/14 2346

## 2014-05-07 ENCOUNTER — Telehealth: Payer: Self-pay | Admitting: Family Medicine

## 2014-05-07 MED ORDER — METHYLPREDNISOLONE 4 MG PO KIT: PACK | ORAL | Status: DC

## 2014-05-07 NOTE — Telephone Encounter (Signed)
Use Benadryl every 4 hours prn and apply ice packs to the area. Also call in a Medrol dose pack

## 2014-05-07 NOTE — Telephone Encounter (Signed)
I spoke with pt and sent script e-scribe to pharmacy.

## 2014-05-07 NOTE — Telephone Encounter (Signed)
Pt got stung by a bee yesterday and did not know she was allergic. Pt went to ed bc she began to feel funny and then experienced SOB. Was treated and released.  Pt now states her arm itches, kind of hurts and feels irritated.  Would like to know if dr fry could send something to help w/ that. [facility] [address]

## 2014-05-15 ENCOUNTER — Encounter: Payer: Self-pay | Admitting: Internal Medicine

## 2014-05-15 ENCOUNTER — Ambulatory Visit (INDEPENDENT_AMBULATORY_CARE_PROVIDER_SITE_OTHER): Payer: BC Managed Care – PPO | Admitting: Internal Medicine

## 2014-05-15 VITALS — BP 118/82 | HR 58 | Temp 98.5°F | Resp 12 | Wt 251.0 lb

## 2014-05-15 DIAGNOSIS — E119 Type 2 diabetes mellitus without complications: Secondary | ICD-10-CM

## 2014-05-15 LAB — HEMOGLOBIN A1C: Hgb A1c MFr Bld: 9.2 % — ABNORMAL HIGH (ref 4.6–6.5)

## 2014-05-15 MED ORDER — CANAGLIFLOZIN 100 MG PO TABS: 100.0000 mg | ORAL_TABLET | Freq: Every day | ORAL | Status: DC

## 2014-05-15 MED ORDER — METFORMIN HCL ER 500 MG PO TB24: 1000.0000 mg | ORAL_TABLET | Freq: Two times a day (BID) | ORAL | Status: DC

## 2014-05-15 NOTE — Progress Notes (Signed)
Patient ID: Krista Benjamin, female   DOB: [birth year], 63 y.o.   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  Last visit 3 mo ago.  She is on solumedrol for a bee sting.  Last hemoglobin A1c was:  Lab Results  Component Value Date   HGBA1C 7.6* 02/11/2014   HGBA1C 7.1* 11/12/2013   HGBA1C 7.6* 08/09/2013  Prev. 5.9% on 12/2012, previously 6.1%.  Pt is on a regimen of: - Metformin 2x500 mg 2x a day >> nausea, diarrhea - but only if sugars higher! - Amaryl 2 mg daily 2x a day - Tradjenta 5 mg daily We stopped Lantus 10 units qhs. We stopped Actos 45 mg (was on it since 07/2001) Metformin 1000 mg bid >> stopped b/c Nausea/Diarrhea Metformin 1000 mg po bid >> did not take in the past, as she had mild CKD. She was on Novolog 8 units tid ac, when sugars >140   Pt checks her sugars 1-2x day and they are much worse: - am: 74-100 >> 90-120 >> 95-130 >> 80s-120 (occas. 140, 198) >> n/c - 2h after b'fast: n/c as not eating b'fast >> 165-212 - lunch: 99-130 >> 100-180 (238) >> 125 - 2h after lunch: 268, 286 - dinner >> 130-150 >> 120-170 >> n/c - 2h after dinner: 115-197 (211) >> 174-213 - bedtime: 70-100 >> 65-75 >> 180-220 >> 136-242 >> 160-312 Lowest CBG: 120; she has hypoglycemia awareness at 70. Highest sugar was 377.  Pt's meals are - she tells me she relaxed her diet recently: - Breakfast: was skipping, but now smoothies - Lunch: meat + veggies  - Dinner: meat + veggies - Snacks: 1-2, fruit, yoghurt Pt cut down meats and started to eat only meat and vegetables >> sugars improved >> feels much better, has more energy, swelling in legs better.  She lost 80 lbs since 2012 >> could come off insulin. Gained 4 lbs since last visit. Goes to the gym in am.  - has CKD stage 2. Last BUN/creatinine was:  Lab Results  Component Value Date    BUN 21 05/06/2014   Lab Results  Component Value Date   CREATININE 0.78 05/06/2014  She is on quinapril. Latest ACR was 9.4 on 12/2012.  - last lipid profile: Lab Results  Component Value Date   CHOL 148 07/15/2013   HDL 41.90 07/15/2013   LDLCALC 94 07/15/2013   TRIG 63.0 07/15/2013   CHOLHDL 4 07/15/2013  She is on Pravastatin. - last eye exam was in 04/2013 by Dr. Ashley Royalty: Background diabetic retinopathy; had cataract sx in 2014. She will have one soon. - no numbness and tingling in her legs  She also has a history of hypertension, hyperlipidemia-on Lipitor, iron deficiency anemia-on iron therapy, aortic valve disease/murmur, left ventricular hypertrophy per EKG, obesity, multinodular goiter, fibroids. Last TSH: Lab Results  Component Value Date   TSH 1.01 07/15/2013   I reviewed pt's medications, allergies, PMH, social hx, family hx and no changes required, except as mentioned above.   ROS: Constitutional: no weight loss/gain, no fatigue, no subjective hyperthermia/hypothermia, + nocturia Eyes: no blurry vision, no xerophthalmia ENT: no sore throat, no nodules palpated in throat, no dysphagia/odynophagia, + hoarseness Cardiovascular: no CP/SOB/palpitations/leg swelling Respiratory: no cough/SOB/wheezing Gastrointestinal: no N/V/D/C Musculoskeletal: no muscle/joint aches Skin: no rashes Neurological: no tremors/numbness/tingling/dizziness  PE: BP 118/82  Pulse 58  Temp(Src) 98.5 F (36.9 C) (  Oral)  Resp 12  Wt 251 lb (113.853 kg)  SpO2 97% Body mass index is 42.07 kg/(m^2).  Wt Readings from Last 3 Encounters:  05/15/14 251 lb (113.853 kg)  02/14/14 247 lb (112.038 kg)  02/11/14 246 lb (111.585 kg)   Constitutional: obese, in NAD Eyes: PERRLA, EOMI, no exophthalmos ENT: moist mucous membranes, no thyromegaly, no cervical lymphadenopathy Cardiovascular: RRR, No MRG, + nonpitting edema lower legs Respiratory: CTA B Gastrointestinal: abdomen soft, NT, ND,  BS+ Musculoskeletal: no deformities, left forearm in sling Skin: moist, warm, stasis dermatitis on legs  ASSESSMENT: 1. DM2, non-insulin-dependent, uncontrolled, with complications - CKD stage 2 - background DR  PLAN:  1.  Patient with long-standing diabetes, with worsening control. Sugars higher at night. - I advised her to:  Patient Instructions  Please continue: - Amaryl 2 mg 2x a day before meals. - Tradjenta 5 mg before breakfast Stop: - regular Metformin Start: - Metformin ER 1000 mg 2x a day (start with 500 mg 2x a day for few days, then increase as tolerated) - Invokana 100 mg before breakfast Please return in 1 month with your sugar log.  Please stop at the lab. - we discussed about SEs of Invokana, which are: dizziness (advised to be careful when stands from sitting position), decreased BP - usually not < normal (BP today is not low), and fungal UTIs (advised to let me know if develops one).  - given discount card for Invokana - I advised her to check BP at home and let me know what it is and how she feels in 2-3 days >> we may need to stop one of her Bumex tabs if this is low. I will check with Dr Clent RidgesFry first. - advised to return to previous diet - continue to check sugars 2x a day, rotating checks times - given more sugar logs - will check a hemoglobin A1c today. No ACR needed since on ACE inhibitor. - I will see her back in 1 month with her sugar log    Office Visit on 05/15/2014  Component Date Value Ref Range Status  . Hemoglobin A1C 05/15/2014 9.2* 4.6 - 6.5 % Final   Glycemic Control Guidelines for People with Diabetes:Non Diabetic:  <6%Goal of Therapy: <7%Additional Action Suggested:  >8%    HbA1c higher >> see plan above.

## 2014-05-15 NOTE — Patient Instructions (Signed)
Please continue: - Amaryl 2 mg 2x a day before meals. - Tradjenta 5 mg before breakfast Stop: - regular Metformin Start: - Metformin ER 1000 mg 2x a day (start with 500 mg 2x a day for few days, then increase as tolerated) - Invokana 100 mg before breakfast  Please return in 1 month with your sugar log.   Please stop at the lab.

## 2014-05-16 ENCOUNTER — Encounter: Payer: Self-pay | Admitting: Cardiology

## 2014-05-16 ENCOUNTER — Ambulatory Visit (INDEPENDENT_AMBULATORY_CARE_PROVIDER_SITE_OTHER): Payer: BC Managed Care – PPO | Admitting: Cardiology

## 2014-05-16 VITALS — BP 125/73 | HR 60 | Ht 65.0 in | Wt 248.4 lb

## 2014-05-16 DIAGNOSIS — I359 Nonrheumatic aortic valve disorder, unspecified: Secondary | ICD-10-CM

## 2014-05-16 DIAGNOSIS — I350 Nonrheumatic aortic (valve) stenosis: Secondary | ICD-10-CM

## 2014-05-16 NOTE — Patient Instructions (Addendum)
Your physician recommends that you schedule a follow-up appointment in: 2 years with Dr. Antoine Poche  We are ordering an echo to be done soon  We are ordering lab work

## 2014-05-16 NOTE — Progress Notes (Signed)
HPI The patient presents for 2 year followup of her known mild aortic stenosis.  Since I last saw her she has been dieting and has had significant weight loss.  She rarely gets some chest discomfort but she says this is unchanged from previous.  She has had some increased dyspnea. However, she is exercising more.  She has had no new palpitations, presyncope or syncope. She has had no PND or orthopnea. She has had some mild continued lower extremity edema that is actually less than before. She is exercising more routinely. She's able to do this without chest discomfort. She has had some surgeries including shoulder surgery and is still limited by this discomfort.  No Known Allergies  Current Outpatient Prescriptions  Medication Sig Dispense Refill  . albuterol (PROVENTIL HFA;VENTOLIN HFA) 108 (90 BASE) MCG/ACT inhaler Inhale 2 puffs into the lungs every 4 (four) hours as needed for wheezing.  1 Inhaler  6  . aspirin 81 MG tablet Take 81 mg by mouth at bedtime.       Marland Kitchen atorvastatin (LIPITOR) 40 MG tablet Take 1 tablet (40 mg total) by mouth at bedtime.  90 tablet  3  . bumetanide (BUMEX) 1 MG tablet Take 2 tablets (2 mg total) by mouth at bedtime.  180 tablet  3  . Canagliflozin 100 MG TABS Take 1 tablet (100 mg total) by mouth daily before breakfast.  30 tablet  2  . cholecalciferol (VITAMIN D) 1000 UNITS tablet Take 2,000 Units by mouth at bedtime.       Marland Kitchen diltiazem (DILACOR XR) 240 MG 24 hr capsule Take 1 capsule (240 mg total) by mouth daily.  90 capsule  3  . FeAspGl-FeFum-B12-FA-C-Succ Ac (FERREX 28) TABS Take 1 tablet by mouth daily.  90 tablet  3  . glimepiride (AMARYL) 4 MG tablet Take 0.5 tablets (2 mg total) by mouth 2 (two) times daily.  60 tablet  3  . Glucose Blood (BLOOD GLUCOSE TEST STRIPS) STRP Check 3x a day  300 each  3  . linagliptin (TRADJENTA) 5 MG TABS tablet Take 1 tablet (5 mg total) by mouth every morning.  90 tablet  1  . metFORMIN (GLUCOPHAGE-XR) 500 MG 24 hr tablet  Take 2 tablets (1,000 mg total) by mouth 2 (two) times daily with a meal.  120 tablet  11  . methylPREDNISolone (MEDROL DOSEPAK) 4 MG tablet follow package directions  21 tablet  0  . metoprolol (LOPRESSOR) 50 MG tablet Take 1 tablet (50 mg total) by mouth daily.  90 tablet  3  . quinapril (ACCUPRIL) 20 MG tablet Take 1 tablet (20 mg total) by mouth at bedtime.  90 tablet  3   No current facility-administered medications for this visit.    Past Medical History  Diagnosis Date  . Hyperlipidemia   . Anemia   . Cataract   . Hypertension     sees Dr. Angelina Sheriff  . Gynecological examination     sees Dr. Jennette Kettle   . Diabetes mellitus     sees Dr. Elvera Lennox   . Aortic stenosis     Mild    Past Surgical History  Procedure Laterality Date  . Excision of benign cyst      from left maxilla and left maxillary sinus  . Colonoscopy  11-26-07    per Dr. Jarold Motto, repeat in 10 yrs   . Knee surgery    . Shoulder arthroscopy w/ rotator cuff repair Left 09-23-13    [facility]  Del[facility]ason [facility] [place]   . Carpal tunnel release  01/22/14    Left Hand   . Cataract extraction      Lens surgery    ROS:  As stated in the HPI and negative for all other systems.  PHYSICAL EXAM BP 125/73  Pulse 60  Ht 5\' 5"  (1.651 m)  Wt 248 lb 6.4 oz (112.674 kg)  BMI 41.34 kg/m2 GENERAL:  Well appearing HEENT:  Pupils equal round and reactive, fundi not visualized, oral mucosa unremarkable NECK:  No jugular venous distention, waveform within normal limits, carotid upstroke brisk and symmetric, no bruits, no thyromegaly LYMPHATICS:  No cervical, inguinal adenopathy LUNGS:  Clear to auscultation bilaterally BACK:  No CVA tenderness CHEST:  Unremarkable HEART:  PMI not displaced or sustained,S1 and S2 within normal limits, no S3, no S4, no clicks, no rubs, apical systolic murmur early peaking and radiating slightly to the carotids, no diastolic murmurs ABD:  Flat, positive bowel sounds  normal in frequency in pitch, no bruits, no rebound, no guarding, no midline pulsatile mass, no hepatomegaly, no splenomegaly EXT:  2 plus pulses throughout, mild bilateral lower extremity edema, no cyanosis no clubbing SKIN:  No rashes no nodules NEURO:  Cranial nerves II through XII grossly intact, motor grossly intact throughout PSYCH:  Cognitively intact, oriented to person place and time  EKG:   Sinus rhythm, rate 60, left bundle branch block, left axis deviation unchanged from previous.   05/16/2014   ASSESSMENT AND PLAN  AORTIC STENOSIS:    This has been mild. However, with her increased shortness of breath despite her weight loss I will be checking a BNP level and an echocardiogram.  HTN:  Her blood pressure is well controlled. She will continue the meds as listed.  OBESITY:  I am very powder for her weight loss.  I encouraged more of the same and we discussed strategies.

## 2014-05-17 LAB — BRAIN NATRIURETIC PEPTIDE: Brain Natriuretic Peptide: 64.2 pg/mL (ref 0.0–100.0)

## 2014-05-21 ENCOUNTER — Ambulatory Visit (HOSPITAL_COMMUNITY)
Admission: RE | Admit: 2014-05-21 | Discharge: 2014-05-21 | Disposition: A | Discharge status: Home / Self Care | Payer: BC Managed Care – PPO | Source: Ambulatory Visit | Attending: Cardiovascular Disease | Admitting: Cardiovascular Disease

## 2014-05-21 DIAGNOSIS — I359 Nonrheumatic aortic valve disorder, unspecified: Secondary | ICD-10-CM | POA: Diagnosis not present

## 2014-05-21 DIAGNOSIS — I517 Cardiomegaly: Secondary | ICD-10-CM

## 2014-05-21 DIAGNOSIS — I10 Essential (primary) hypertension: Secondary | ICD-10-CM | POA: Insufficient documentation

## 2014-05-21 DIAGNOSIS — I350 Nonrheumatic aortic (valve) stenosis: Secondary | ICD-10-CM

## 2014-05-21 DIAGNOSIS — E785 Hyperlipidemia, unspecified: Secondary | ICD-10-CM | POA: Insufficient documentation

## 2014-05-21 DIAGNOSIS — R079 Chest pain, unspecified: Secondary | ICD-10-CM

## 2014-05-21 DIAGNOSIS — E119 Type 2 diabetes mellitus without complications: Secondary | ICD-10-CM | POA: Insufficient documentation

## 2014-05-21 NOTE — Progress Notes (Signed)
2D Echocardiogram Complete.  05/21/2014   Elky Funches, RDCS  

## 2014-05-22 ENCOUNTER — Telehealth: Payer: Self-pay | Admitting: Cardiology

## 2014-05-22 ENCOUNTER — Telehealth: Payer: Self-pay | Admitting: *Deleted

## 2014-05-22 NOTE — Progress Notes (Signed)
Pt. Called and informed of her results

## 2014-05-22 NOTE — Telephone Encounter (Signed)
Spoke [facility] LPN HE STATES HE SPOKE TO PATIENT

## 2014-05-22 NOTE — Telephone Encounter (Signed)
Ms Krista Benjamin is calling because she receive a call and wanting to know if it was about her results of the echo .Marland Kitchen Please call    Thanks

## 2014-05-22 NOTE — Telephone Encounter (Signed)
Pt. Has been informed about her labs and her echo

## 2014-05-22 NOTE — Progress Notes (Signed)
Pt informed of her labwork

## 2014-05-22 NOTE — Telephone Encounter (Signed)
Pt. Informed about her labs and her echo

## 2014-06-17 ENCOUNTER — Telehealth: Payer: Self-pay | Admitting: Family Medicine

## 2014-06-17 NOTE — Telephone Encounter (Signed)
Pt is waiting for mailorder and needs 10 day supply of ferrex 28 call [facility]. Pt needs med today. Pt is having surgeon tomorrow

## 2014-06-17 NOTE — Telephone Encounter (Signed)
I called in # [phone] and I spoke with pt.

## 2014-06-23 ENCOUNTER — Ambulatory Visit (INDEPENDENT_AMBULATORY_CARE_PROVIDER_SITE_OTHER): Payer: BC Managed Care – PPO | Admitting: Family Medicine

## 2014-06-23 DIAGNOSIS — Z23 Encounter for immunization: Secondary | ICD-10-CM

## 2014-06-25 ENCOUNTER — Other Ambulatory Visit: Payer: Self-pay | Admitting: Internal Medicine

## 2014-06-30 ENCOUNTER — Encounter: Payer: Self-pay | Admitting: Internal Medicine

## 2014-06-30 ENCOUNTER — Ambulatory Visit (INDEPENDENT_AMBULATORY_CARE_PROVIDER_SITE_OTHER): Payer: BC Managed Care – PPO | Admitting: Internal Medicine

## 2014-06-30 VITALS — BP 114/68 | HR 56 | Temp 98.7°F | Resp 12 | Wt 245.0 lb

## 2014-06-30 DIAGNOSIS — E119 Type 2 diabetes mellitus without complications: Secondary | ICD-10-CM

## 2014-06-30 LAB — HM DIABETES FOOT EXAM: HM Diabetic Foot Exam: NORMAL

## 2014-06-30 MED ORDER — GLIMEPIRIDE 4 MG PO TABS: 4.0000 mg | ORAL_TABLET | Freq: Two times a day (BID) | ORAL | Status: DC

## 2014-06-30 MED ORDER — METFORMIN HCL ER 500 MG PO TB24: 1000.0000 mg | ORAL_TABLET | Freq: Two times a day (BID) | ORAL | Status: DC

## 2014-06-30 NOTE — Patient Instructions (Signed)
Please continue:  - Tradjenta 5 mg daily  - Metformin ER 500 mg 4x a day  Please increase: - Amaryl to 4 mg daily 2x a day   Please return in 2 months with your sugar log (after 08/15/2014).

## 2014-06-30 NOTE — Progress Notes (Signed)
Patient ID: Krista Benjamin, female   DOB: 10-19-1950, 63 y.o.   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  Last visit 1.5 mo ago.  She had L trigger finger surgery 2 weeks ago.   Last hemoglobin A1c was:  Lab Results  Component Value Date   HGBA1C 9.2* 05/15/2014   HGBA1C 7.6* 02/11/2014   HGBA1C 7.1* 11/12/2013  Prev. 5.9% on 12/2012, previously 6.1%.  Pt is on a regimen of: - Amaryl 2 mg daily 2x a day - Tradjenta 5 mg daily - Metformin ER 500 mg 2x a day  We tried Invokana 100 mg in am >> stopped after 3 days >> nausea, diarrhea We stopped Lantus 10 units qhs. We stopped Actos 45 mg (was on it since 07/2001) Metformin 1000 mg bid >> stopped b/c Nausea/Diarrhea Metformin 1000 mg po bid >> did not take in the past, as she had mild CKD. She was on Novolog 8 units tid ac, when sugars >140   Pt checks her sugars 1-2x day and they are: - am: 74-100 >> 90-120 >> 95-130 >> 80s-120 (occas. 140, 198) >> n/c  - 2h after b'fast: n/c as not eating b'fast >> 165-212 >> 148-213 - lunch: 99-130 >> 100-180 (238) >> 125 >> n/c >> 242 - 2h after lunch: 268, 286 >> n/c - dinner >> 130-150 >> 120-170 >> n/c  - 2h after dinner: 115-197 (211) >> 174-213 >> 222 - bedtime: 70-100 >> 65-75 >> 180-220 >> 136-242 >> 160-312 >> 130-235 Lowest CBG: 130; she has hypoglycemia awareness at 70. Highest sugar was 323 x1.  Pt's meals are - she tells me she relaxed her diet recently: - Breakfast: was skipping, but now smoothies - Lunch: meat + veggies  - Dinner: meat + veggies - Snacks: 1-2, fruit, yoghurt Pt cut down meats and started to eat only meat and vegetables >> sugars improved >> feels much better, has more energy, swelling in legs better.  She lost 80 lbs since 2012 >> could come off insulin. Goes to the gym in am.  - has CKD stage 2. Last  BUN/creatinine was:  Lab Results  Component Value Date   BUN 21 05/06/2014   Lab Results  Component Value Date   CREATININE 0.78 05/06/2014  She is on quinapril. Latest ACR was 9.4 on 12/2012.  - last lipid profile: Lab Results  Component Value Date   CHOL 148 07/15/2013   HDL 41.90 07/15/2013   LDLCALC 94 07/15/2013   TRIG 63.0 07/15/2013   CHOLHDL 4 07/15/2013  She is on Pravastatin. - last eye exam was in 04/2013 by Dr. Ashley Royalty: Background diabetic retinopathy; had cataract sx in 2014. She will have one soon. - no numbness and tingling in her legs  She also has a history of hypertension, hyperlipidemia-on Lipitor, iron deficiency anemia-on iron therapy, aortic valve disease/murmur, left ventricular hypertrophy per EKG, obesity, multinodular goiter, fibroids. Last TSH: Lab Results  Component Value Date   TSH 1.01 07/15/2013   I reviewed pt's medications, allergies, PMH, social hx, family hx and no changes required, except as mentioned above.   ROS: Constitutional: no weight loss/gain, no fatigue, no subjective hyperthermia/hypothermia, + nocturia Eyes: no blurry vision, no xerophthalmia ENT: no sore throat, no nodules palpated in throat, no dysphagia/odynophagia, + hoarseness Cardiovascular: no CP/SOB/palpitations/leg swelling Respiratory: no cough/SOB/wheezing Gastrointestinal: no N/V/D/C Musculoskeletal: no muscle/joint aches Skin: no  rashes Neurological: no tremors/numbness/tingling/dizziness  PE: BP 114/68  Pulse 56  Temp(Src) 98.7 F (37.1 C) (Oral)  Resp 12  Wt 245 lb (111.131 kg)  SpO2 97% Body mass index is 40.77 kg/(m^2).  Wt Readings from Last 3 Encounters:  06/30/14 245 lb (111.131 kg)  05/16/14 248 lb 6.4 oz (112.674 kg)  05/15/14 251 lb (113.853 kg)   Constitutional: obese, in NAD Eyes: PERRLA, EOMI, no exophthalmos ENT: moist mucous membranes, no thyromegaly, no cervical lymphadenopathy Cardiovascular: RRR, No MRG, + nonpitting edema lower legs  (periankle) Respiratory: CTA B Gastrointestinal: abdomen soft, NT, ND, BS+ Musculoskeletal: no deformities, left forearm in sling Skin: moist, warm, stasis dermatitis on legs  ASSESSMENT: 1. DM2, non-insulin-dependent, uncontrolled, with complications - CKD stage 2 - background DR  PLAN:  1.  Patient with long-standing diabetes, with poor control. Sugars higher at night. She could not tolerate Invokana >> will increase Amaryl for now. We can also switch Tradjenta with Tanzeum if needed. She refuses insulin (Lantus). - I advised her to:  Patient Instructions  Please continue:  - Tradjenta 5 mg daily  - Metformin ER 500 mg 4x a day  Please increase: - Amaryl to 4 mg daily 2x a day  Please return in 2 months with your sugar log (after 08/15/2014). - continue to check sugars 2x a day, rotating checks times - given more sugar logs - she had the flu vaccine last week - I will see her back in 2 months with her sugar log

## 2014-08-10 LAB — HM MAMMOGRAPHY

## 2014-09-02 ENCOUNTER — Ambulatory Visit: Payer: BC Managed Care – PPO | Admitting: Internal Medicine

## 2014-09-17 ENCOUNTER — Ambulatory Visit (INDEPENDENT_AMBULATORY_CARE_PROVIDER_SITE_OTHER): Payer: BC Managed Care – PPO | Admitting: Ophthalmology

## 2014-09-17 DIAGNOSIS — H43813 Vitreous degeneration, bilateral: Secondary | ICD-10-CM

## 2014-09-17 DIAGNOSIS — E11339 Type 2 diabetes mellitus with moderate nonproliferative diabetic retinopathy without macular edema: Secondary | ICD-10-CM

## 2014-09-17 DIAGNOSIS — H25012 Cortical age-related cataract, left eye: Secondary | ICD-10-CM

## 2014-09-17 DIAGNOSIS — I10 Essential (primary) hypertension: Secondary | ICD-10-CM

## 2014-09-17 DIAGNOSIS — H26491 Other secondary cataract, right eye: Secondary | ICD-10-CM

## 2014-09-17 DIAGNOSIS — E11321 Type 2 diabetes mellitus with mild nonproliferative diabetic retinopathy with macular edema: Secondary | ICD-10-CM

## 2014-09-17 DIAGNOSIS — H35033 Hypertensive retinopathy, bilateral: Secondary | ICD-10-CM

## 2014-09-17 DIAGNOSIS — E11319 Type 2 diabetes mellitus with unspecified diabetic retinopathy without macular edema: Secondary | ICD-10-CM

## 2014-09-17 LAB — HM DIABETES EYE EXAM

## 2014-09-22 ENCOUNTER — Other Ambulatory Visit (INDEPENDENT_AMBULATORY_CARE_PROVIDER_SITE_OTHER): Payer: BC Managed Care – PPO

## 2014-09-22 DIAGNOSIS — Z0000 Encounter for general adult medical examination without abnormal findings: Secondary | ICD-10-CM

## 2014-09-22 LAB — LIPID PANEL
Cholesterol: 154 mg/dL (ref 0–200)
HDL: 46.4 mg/dL (ref >39.00)
LDL Cholesterol: 98 mg/dL (ref 0–99)
NonHDL: 107.6
Total CHOL/HDL Ratio: 3
Triglycerides: 50 mg/dL (ref 0.0–149.0)
VLDL: 10 mg/dL (ref 0.0–40.0)

## 2014-09-22 LAB — COMPREHENSIVE METABOLIC PANEL
ALT: 15 U/L (ref 0–35)
AST: 19 U/L (ref 0–37)
Albumin: 4 g/dL (ref 3.5–5.2)
Alkaline Phosphatase: 74 U/L (ref 39–117)
BUN: 24 mg/dL — ABNORMAL HIGH (ref 6–23)
CO2: 27 mEq/L (ref 19–32)
Calcium: 9.2 mg/dL (ref 8.4–10.5)
Chloride: 105 mEq/L (ref 96–112)
Creatinine, Ser: 0.7 mg/dL (ref 0.4–1.2)
GFR: 111.88 mL/min (ref >60.00)
Glucose, Bld: 183 mg/dL — ABNORMAL HIGH (ref 70–99)
Potassium: 3.9 mEq/L (ref 3.5–5.1)
Sodium: 138 mEq/L (ref 135–145)
Total Bilirubin: 0.5 mg/dL (ref 0.2–1.2)
Total Protein: 7.2 g/dL (ref 6.0–8.3)

## 2014-09-22 LAB — POCT URINALYSIS DIPSTICK
Bilirubin, UA: NEGATIVE
Glucose, UA: NEGATIVE
Ketones, UA: NEGATIVE
Leukocytes, UA: NEGATIVE
Nitrite, UA: NEGATIVE
Spec Grav, UA: 1.02
Urobilinogen, UA: 0.2
pH, UA: 5.5

## 2014-09-22 LAB — CBC WITH DIFFERENTIAL/PLATELET
Basophils Absolute: 0 10*3/uL (ref 0.0–0.1)
Basophils Relative: 0.5 % (ref 0.0–3.0)
Eosinophils Absolute: 0.1 10*3/uL (ref 0.0–0.7)
Eosinophils Relative: 1 % (ref 0.0–5.0)
HCT: 38.3 % (ref 36.0–46.0)
Hemoglobin: 12.2 g/dL (ref 12.0–15.0)
Lymphocytes Relative: 35 % (ref 12.0–46.0)
Lymphs Abs: 2.6 10*3/uL (ref 0.7–4.0)
MCHC: 31.9 g/dL (ref 30.0–36.0)
MCV: 81.2 fl (ref 78.0–100.0)
Monocytes Absolute: 0.5 10*3/uL (ref 0.1–1.0)
Monocytes Relative: 6.3 % (ref 3.0–12.0)
Neutro Abs: 4.2 10*3/uL (ref 1.4–7.7)
Neutrophils Relative %: 57.2 % (ref 43.0–77.0)
Platelets: 216 10*3/uL (ref 150.0–400.0)
RBC: 4.72 Mil/uL (ref 3.87–5.11)
RDW: 14.1 % (ref 11.5–15.5)
WBC: 7.4 10*3/uL (ref 4.0–10.5)

## 2014-09-22 LAB — MICROALBUMIN / CREATININE URINE RATIO
Creatinine,U: 111.9 mg/dL
Microalb Creat Ratio: 5.5 mg/g (ref 0.0–30.0)
Microalb, Ur: 6.2 mg/dL — ABNORMAL HIGH (ref 0.0–1.9)

## 2014-09-22 LAB — TSH: TSH: 1.27 u[IU]/mL (ref 0.35–4.50)

## 2014-09-22 LAB — HEMOGLOBIN A1C: Hgb A1c MFr Bld: 8.1 % — ABNORMAL HIGH (ref 4.6–6.5)

## 2014-09-23 ENCOUNTER — Other Ambulatory Visit: Payer: Self-pay | Admitting: Internal Medicine

## 2014-09-29 ENCOUNTER — Ambulatory Visit (INDEPENDENT_AMBULATORY_CARE_PROVIDER_SITE_OTHER): Payer: BC Managed Care – PPO | Admitting: Family Medicine

## 2014-09-29 ENCOUNTER — Encounter: Payer: Self-pay | Admitting: Family Medicine

## 2014-09-29 VITALS — BP 126/70 | Temp 98.2°F | Ht 65.0 in | Wt 258.0 lb

## 2014-09-29 DIAGNOSIS — Z0000 Encounter for general adult medical examination without abnormal findings: Secondary | ICD-10-CM

## 2014-09-29 MED ORDER — DILTIAZEM HCL ER 240 MG PO CP24: 240.0000 mg | ORAL_CAPSULE | Freq: Every day | ORAL | Status: DC

## 2014-09-29 MED ORDER — METOPROLOL TARTRATE 50 MG PO TABS
50.0000 mg | ORAL_TABLET | Freq: Every day | ORAL | Status: DC
Start: 2014-09-29 — End: 2015-10-26

## 2014-09-29 MED ORDER — BUMETANIDE 1 MG PO TABS: 2.0000 mg | ORAL_TABLET | Freq: Every day | ORAL | Status: DC

## 2014-09-29 MED ORDER — ATORVASTATIN CALCIUM 40 MG PO TABS
40.0000 mg | ORAL_TABLET | Freq: Every day | ORAL | Status: DC
Start: 2014-09-29 — End: 2015-08-25

## 2014-09-29 MED ORDER — QUINAPRIL HCL 20 MG PO TABS: 20.0000 mg | ORAL_TABLET | Freq: Every day | ORAL | Status: DC

## 2014-09-29 NOTE — Progress Notes (Signed)
Pre visit review using our clinic review tool, if applicable. No additional management support is needed unless otherwise documented below in the visit note. 

## 2014-09-29 NOTE — Progress Notes (Signed)
   Subjective:    Patient ID: Krista Benjamin, female    DOB: 08/21/1951, 63 y.o.   MRN: [id number]  HPI 63 yr old female for a cpx. She feels well.    Review of Systems  Constitutional: Negative.   HENT: Negative.   Eyes: Negative.   Respiratory: Negative.   Cardiovascular: Negative.   Gastrointestinal: Negative.   Genitourinary: Negative for dysuria, urgency, frequency, hematuria, flank pain, decreased urine volume, enuresis, difficulty urinating, pelvic pain and dyspareunia.  Musculoskeletal: Negative.   Skin: Negative.   Neurological: Negative.   Psychiatric/Behavioral: Negative.        Objective:   Physical Exam  Constitutional: She is oriented to person, place, and time. She appears well-developed and well-nourished. No distress.  HENT:  Head: Normocephalic and atraumatic.  Right Ear: External ear normal.  Left Ear: External ear normal.  Nose: Nose normal.  Mouth/Throat: Oropharynx is clear and moist. No oropharyngeal exudate.  Eyes: Conjunctivae and EOM are normal. Pupils are equal, round, and reactive to light. No scleral icterus.  Neck: Normal range of motion. Neck supple. No JVD present. No thyromegaly present.  Cardiovascular: Normal rate, regular rhythm, normal heart sounds and intact distal pulses.  Exam reveals no gallop and no friction rub.   No murmur heard. Pulmonary/Chest: Effort normal and breath sounds normal. No respiratory distress. She has no wheezes. She has no rales. She exhibits no tenderness.  Abdominal: Soft. Bowel sounds are normal. She exhibits no distension and no mass. There is no tenderness. There is no rebound and no guarding.  Musculoskeletal: Normal range of motion. She exhibits no edema or tenderness.  Lymphadenopathy:    She has no cervical adenopathy.  Neurological: She is alert and oriented to person, place, and time. She has normal reflexes. No cranial nerve deficit. She exhibits normal muscle tone. Coordination normal.  Skin: Skin  is warm and dry. No rash noted. No erythema.  Psychiatric: She has a normal mood and affect. Her behavior is normal. Judgment and thought content normal.          Assessment & Plan:  Well exam. Given her second pneumonia vaccine.

## 2014-09-30 ENCOUNTER — Ambulatory Visit: Payer: BC Managed Care – PPO | Admitting: Internal Medicine

## 2014-10-01 ENCOUNTER — Ambulatory Visit (INDEPENDENT_AMBULATORY_CARE_PROVIDER_SITE_OTHER): Payer: BC Managed Care – PPO | Admitting: Family Medicine

## 2014-10-01 DIAGNOSIS — Z23 Encounter for immunization: Secondary | ICD-10-CM

## 2014-10-02 ENCOUNTER — Ambulatory Visit (INDEPENDENT_AMBULATORY_CARE_PROVIDER_SITE_OTHER): Payer: BC Managed Care – PPO | Admitting: Internal Medicine

## 2014-10-02 ENCOUNTER — Encounter: Payer: Self-pay | Admitting: Internal Medicine

## 2014-10-02 VITALS — BP 120/64 | HR 54 | Temp 98.4°F | Resp 12 | Wt 255.0 lb

## 2014-10-02 DIAGNOSIS — E1165 Type 2 diabetes mellitus with hyperglycemia: Secondary | ICD-10-CM

## 2014-10-02 DIAGNOSIS — IMO0002 Reserved for concepts with insufficient information to code with codable children: Secondary | ICD-10-CM

## 2014-10-02 DIAGNOSIS — E11319 Type 2 diabetes mellitus with unspecified diabetic retinopathy without macular edema: Secondary | ICD-10-CM

## 2014-10-02 NOTE — Progress Notes (Signed)
Patient ID: Krista Benjamin, female   DOB: 05/07/1951, 63 y.o.   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  Last visit 3 mo ago.  Last hemoglobin A1c was:  Lab Results  Component Value Date   HGBA1C 8.1* 09/22/2014   HGBA1C 9.2* 05/15/2014   HGBA1C 7.6* 02/11/2014  Prev. 5.9% on 12/2012, previously 6.1%.  Pt is on a regimen of: - Amaryl 4 mg daily 2x a day  - Tradjenta 5 mg daily - Metformin ER 500 mg in am and later in the day takes it prn for high sugars: > 130(1-2 a day)  We tried Invokana 100 mg in am >> stopped after 3 days >> nausea, diarrhea We stopped Lantus 10 units qhs. We stopped Actos 45 mg (was on it since 07/2001) Metformin 1000 mg bid >> stopped b/c Nausea/Diarrhea Metformin 1000 mg po bid >> did not take in the past, as she had mild CKD. She was on Novolog 8 units tid ac, when sugars >140   Pt checks her sugars 1-2x day and they are: - am: 74-100 >> 90-120 >> 95-130 >> 80s-120 (occas. 140, 198) >> n/c >> 85-137, but Thanksgiving: 181, 217 - 2h after b'fast: n/c as not eating b'fast >> 165-212 >> 148-213 >> 153 - lunch: 99-130 >> 100-180 (238) >> 125 >> n/c >> 242 >> n/c - 2h after lunch: 268, 286 >> n/c - dinner >> 130-150 >> 120-170 >> n/c >> 109-228, 276 (Thanksgiving) - 2h after dinner: 115-197 (211) >> 174-213 >> 222 >> n/c - bedtime: 70-100 >> 65-75 >> 180-220 >> 136-242 >> 160-312 >> 130-235 >> 139-220, 247, 258 (both Thanksgiving - forgot Amaryl) - night: 115-164 Lowest CBG: 130; she has hypoglycemia awareness at 70. Highest sugar was 323 x1.  Pt's meals are: - Breakfast: was skipping, but some smoothies - Lunch: meat + veggies  - Dinner: meat + veggies - Snacks: 1-2, fruit, yoghurt  She lost 80 lbs since 2012 >> could come off insulin. She used to go to the gym in am, not lately.  - has CKD stage  2. Last BUN/creatinine was:  Lab Results  Component Value Date   BUN 24* 09/22/2014   Lab Results  Component Value Date   CREATININE 0.7 09/22/2014  She is on quinapril. Latest ACR was 9.4 on 12/2012.  - last lipid profile: Lab Results  Component Value Date   CHOL 154 09/22/2014   HDL 46.40 09/22/2014   LDLCALC 98 09/22/2014   TRIG 50.0 09/22/2014   CHOLHDL 3 09/22/2014  She is on Pravastatin. - last eye exam was in 09/17/2014 by Dr. Ashley RoyaltyMatthews: Background diabetic retinopathy; had cataract sx in 2014. She will have one in 11/2014. - no numbness and tingling in her legs  She also has a history of hypertension, hyperlipidemia-on Lipitor, iron deficiency anemia-on iron therapy, aortic valve disease/murmur, left ventricular hypertrophy per EKG, obesity, multinodular goiter, fibroids. Last TSH: Lab Results  Component Value Date   TSH 1.27 09/22/2014   I reviewed pt's medications, allergies, PMH, social hx, family hx and no changes required, except as mentioned above.   ROS: Constitutional: no weight loss/gain, no fatigue, no subjective hyperthermia/hypothermia, + poor sleep Eyes: no blurry vision, no xerophthalmia ENT: no sore throat, no nodules palpated in throat, no dysphagia/odynophagia Cardiovascular: no CP/SOB/palpitations/leg swelling Respiratory: no cough/SOB/wheezing Gastrointestinal: no N/V/D/C Musculoskeletal: no muscle/joint aches Skin: no  rashes Neurological: no tremors/numbness/tingling/dizziness  PE: BP 120/64 mmHg  Pulse 54  Temp(Src) 98.4 F (36.9 C) (Oral)  Resp 12  Wt 255 lb (115.667 kg)  SpO2 97% Body mass index is 42.43 kg/(m^2).  Wt Readings from Last 3 Encounters:  10/02/14 255 lb (115.667 kg)  09/29/14 258 lb (117.028 kg)  06/30/14 245 lb (111.131 kg)   Constitutional: obese, in NAD Eyes: PERRLA, EOMI, no exophthalmos ENT: moist mucous membranes, no thyromegaly, no cervical lymphadenopathy Cardiovascular: RRR, No MRG, + nonpitting edema lower  legs (periankle) Respiratory: CTA B Gastrointestinal: abdomen soft, NT, ND, BS+ Musculoskeletal: no deformities, left forearm in sling Skin: moist, warm, stasis dermatitis on legs  ASSESSMENT: 1. DM2, non-insulin-dependent, uncontrolled, with complications - CKD stage 2 - background DR  PLAN:  1.  Patient with long-standing diabetes, with still suboptimal control. Sugars higher at night, but some spikes in am, too. She only takes Metformin 500 mg once a day and a second tablet prn (?) >> advised to increase to 4 tabs a day. She refuses insulin (Lantus). - I advised her to:  Patient Instructions  Please try to increase: - Metformin ER to 4 tablets a day Continue: - Amaryl 4 mg daily 2x a day  - Tradjenta 5 mg daily  Please return in 3 months with your sugar log.   Merry Christmas!  - continue to check sugars 2x a day, rotating checks times - she had the flu vaccine this season - I will see her back in 3 months with her sugar log

## 2014-10-02 NOTE — Patient Instructions (Signed)
Please try to increase: - Metformin ER to 4 tablets a day Continue: - Amaryl 4 mg daily 2x a day  - Tradjenta 5 mg daily  Please return in 3 months with your sugar log.   Merry Christmas!

## 2014-11-06 ENCOUNTER — Other Ambulatory Visit: Payer: Self-pay | Admitting: Family Medicine

## 2014-12-01 ENCOUNTER — Ambulatory Visit (INDEPENDENT_AMBULATORY_CARE_PROVIDER_SITE_OTHER): Payer: BLUE CROSS/BLUE SHIELD | Admitting: Family Medicine

## 2014-12-01 ENCOUNTER — Encounter: Payer: Self-pay | Admitting: Family Medicine

## 2014-12-01 VITALS — BP 130/76 | HR 65 | Temp 98.2°F | Ht 65.0 in | Wt 260.0 lb

## 2014-12-01 DIAGNOSIS — B029 Zoster without complications: Secondary | ICD-10-CM

## 2014-12-01 MED ORDER — VALACYCLOVIR HCL 1 G PO TABS: 1000.0000 mg | ORAL_TABLET | Freq: Three times a day (TID) | ORAL | Status: DC

## 2014-12-01 NOTE — Progress Notes (Signed)
Pre visit review using our clinic review tool, if applicable. No additional management support is needed unless otherwise documented below in the visit note. 

## 2014-12-01 NOTE — Progress Notes (Signed)
   Subjective:    Patient ID: Krista Benjamin, female    DOB: 02-Aug-1951, 64 y.o.   MRN: [id number]  HPI Here for 3 days of a patch of blisters on the left breast which have mild itching and burning. No other skin areas involved.    Review of Systems  Constitutional: Negative.   Skin: Positive for rash.       Objective:   Physical Exam  Constitutional: She appears well-developed and well-nourished.  Skin:  2 small vesicles on the left upper breast           Assessment & Plan:  Probable early shingles. Treat with Valtrex.

## 2014-12-09 ENCOUNTER — Other Ambulatory Visit: Payer: Self-pay | Admitting: Family Medicine

## 2014-12-09 ENCOUNTER — Telehealth: Payer: Self-pay | Admitting: Family Medicine

## 2014-12-09 ENCOUNTER — Ambulatory Visit (INDEPENDENT_AMBULATORY_CARE_PROVIDER_SITE_OTHER): Payer: BLUE CROSS/BLUE SHIELD | Admitting: Family Medicine

## 2014-12-09 ENCOUNTER — Encounter: Payer: Self-pay | Admitting: Family Medicine

## 2014-12-09 VITALS — BP 158/80 | HR 87 | Temp 99.4°F | Ht 65.0 in | Wt 259.0 lb

## 2014-12-09 DIAGNOSIS — J0100 Acute maxillary sinusitis, unspecified: Secondary | ICD-10-CM

## 2014-12-09 MED ORDER — AZITHROMYCIN 250 MG PO TABS: ORAL_TABLET | ORAL | Status: DC

## 2014-12-09 MED ORDER — HYDROCODONE-HOMATROPINE 5-1.5 MG/5ML PO SYRP: 5.0000 mL | ORAL_SOLUTION | ORAL | Status: DC | PRN

## 2014-12-09 NOTE — Telephone Encounter (Signed)
Pt would like something for head congestion and sneezing call into [facility]. Pt was last seen on 12-01-14

## 2014-12-09 NOTE — Telephone Encounter (Signed)
Please advise 

## 2014-12-09 NOTE — Progress Notes (Signed)
Pre visit review using our clinic review tool, if applicable. No additional management support is needed unless otherwise documented below in the visit note. 

## 2014-12-09 NOTE — Telephone Encounter (Signed)
Tell her to use plain Mucinex for congestion and plain Claritin for sneezing

## 2014-12-09 NOTE — Telephone Encounter (Signed)
Pt made an appt  °

## 2014-12-10 ENCOUNTER — Encounter: Payer: Self-pay | Admitting: Family Medicine

## 2014-12-10 NOTE — Progress Notes (Signed)
   Subjective:    Patient ID: Nivaya Poteete, female    DOB: 23-Dec-1950, 64 y.o.   MRN: [id number]  HPI Here for 4 days of fever, HA, sinus pressure, PND, and coughing up yellow sputum.    Review of Systems  Constitutional: Positive for fever.  HENT: Positive for congestion, postnasal drip and sinus pressure.   Eyes: Negative.   Respiratory: Positive for cough.        Objective:   Physical Exam  Constitutional: She appears well-developed and well-nourished.  HENT:  Right Ear: External ear normal.  Left Ear: External ear normal.  Nose: Nose normal.  Mouth/Throat: Oropharynx is clear and moist.  Eyes: Conjunctivae are normal.  Pulmonary/Chest: Effort normal and breath sounds normal.  Lymphadenopathy:    She has no cervical adenopathy.          Assessment & Plan:  Add Mucinex

## 2015-01-01 ENCOUNTER — Encounter: Payer: BLUE CROSS/BLUE SHIELD | Attending: Family Medicine | Admitting: Dietician

## 2015-01-01 ENCOUNTER — Encounter: Payer: Self-pay | Admitting: Internal Medicine

## 2015-01-01 ENCOUNTER — Ambulatory Visit (INDEPENDENT_AMBULATORY_CARE_PROVIDER_SITE_OTHER): Payer: BLUE CROSS/BLUE SHIELD | Admitting: Internal Medicine

## 2015-01-01 VITALS — Ht 66.0 in | Wt 251.0 lb

## 2015-01-01 VITALS — BP 108/62 | HR 60 | Temp 98.2°F | Resp 12 | Wt 251.0 lb

## 2015-01-01 DIAGNOSIS — E1165 Type 2 diabetes mellitus with hyperglycemia: Secondary | ICD-10-CM | POA: Diagnosis not present

## 2015-01-01 DIAGNOSIS — Z713 Dietary counseling and surveillance: Secondary | ICD-10-CM | POA: Insufficient documentation

## 2015-01-01 DIAGNOSIS — E11319 Type 2 diabetes mellitus with unspecified diabetic retinopathy without macular edema: Secondary | ICD-10-CM | POA: Diagnosis not present

## 2015-01-01 DIAGNOSIS — IMO0002 Reserved for concepts with insufficient information to code with codable children: Secondary | ICD-10-CM

## 2015-01-01 LAB — HEMOGLOBIN A1C: Hgb A1c MFr Bld: 7.4 % — ABNORMAL HIGH (ref 4.6–6.5)

## 2015-01-01 MED ORDER — GLIMEPIRIDE 4 MG PO TABS: 4.0000 mg | ORAL_TABLET | Freq: Two times a day (BID) | ORAL | Status: DC

## 2015-01-01 NOTE — Patient Instructions (Signed)
Please continue: - Metformin ER 1000 mg 2x a day - Tradjenta 5 mg daily  Change the Amaryl dose as follows: - Amaryl 4 mg daily 2x a day    Please return in 1.5 months with your sugar log.    Please stop at the lab.   Please schedule an appt with Oran Rein with nutrition.

## 2015-01-01 NOTE — Progress Notes (Signed)
Medical Nutrition Therapy:  Appt start time: 0830 end time:  0945.  Assessment:  Primary concerns today: Patient with type 2 Diabetes since 1983.  She would like to improve diet to continue to lose weight and obtain better glucose control. She wants to avoid insulin. Patient reports weight of 331 lbs in 2014 with weight loss of 86 lbs and regain of small amount due to not going to the gym. 251 lbs today.  "Losing all of this weight made me realize that I can control it and do well at it."  Patient is retired.  She lives with husband and children.  Patient shops and cooks.  Results for BIONCA, LEARNED (MRN [id number]) as of 01/01/2015 08:34  Ref. Range 08/09/2013 13:14 11/12/2013 09:01 02/11/2014 09:21 05/15/2014 09:28 09/22/2014 09:20  Hemoglobin A1C Latest Range: 4.6-6.5 % 7.6 (H) 7.1 (H) 7.6 (H) 9.2 (H) 8.1 (H)   Wt Readings from Last 3 Encounters:  01/01/15 251 lb (113.853 kg)  01/01/15 251 lb (113.853 kg)  12/09/14 259 lb (117.482 kg)   Preferred Learning Style:   No preference indicated   Learning Readiness:   Ready  Change in progress  MEDICATIONS: see list which includes Amaryl, Glucophage, Tranjenta   DIETARY INTAKE: Patient uses brown rice and WW bread. 24-hr recall:  B ( AM): Not hungry and if she eats will vomit, occasional egg white, [place] bacon, milk, or smoothie with almond milk 1 serving of fruit and greens  Snk ( AM): L (12:30 PM): grits, eggs, [place] bacon or salad with meat Snk ( 2PM): peanut butter crackers, ham or [place] sandwich D ( PM): if husband cooks- fried cubed steak and mac and cheese or patient cooks lemon chicken with vegetables, brown rice Snk ( PM): none- tries not to eat after 6 pm Beverages: water, hot tea with honey, 1/3 cup of OJ occasionally, 1/2 can twice a week diet pepsi, almond milk,   Usual physical activity: Member of [employer].  Had not been going recently but to restart.  Estimated energy needs: 1400 calories 158 g  carbohydrates 88 g protein 47 g fat  Progress Towards Goal(s):  In progress.   Nutritional Diagnosis:  NB-1.1 Food and nutrition-related knowledge deficit As related to balance of carbohydrate, protein, and fat.  As evidenced by patient report..     Intervention:  Nutrition counseling and diabetes education initiated. Discussed Carb Counting by food group as method of portion control, reading food labels, and benefits of increased activity. Also discussed basic physiology of Diabetes, target BG ranges pre and post meals, and A1c.  Meal planning also discussed.  Discussed how to use more variety of whole grains and plant proteins.  Plan:  Aim for 2-3 Carb Choices per meal (30-45 grams) +/- 1 either way  Aim for 0-2 Carbs per snack if hungry  Include protein in moderation with your meals and snacks Consider reading food labels for Total Carbohydrate and Fat Grams of foods Consider  increasing your activity level by going to the gym or walking for 60 minutes 4-5 times a week as tolerated Consider checking BG at alternate times per day as directed by MD  Consider taking medication as directed by MD  Teaching Method Utilized:  Visual Auditory  Handouts given during visit include:  My Plate placemat  Meal plan card  Label reading  Support group flyer  Snack list  Barriers to learning/adherence to lifestyle change: none  Demonstrated degree of understanding via:  Teach Back   Monitoring/Evaluation:  Dietary intake, exercise, label reading, meal planning, and body weight prn.

## 2015-01-01 NOTE — Patient Instructions (Signed)
Plan:  Aim for 2-3 Carb Choices per meal (30-45 grams) +/- 1 either way  Aim for 0-2 Carbs per snack if hungry  Include protein in moderation with your meals and snacks Consider reading food labels for Total Carbohydrate and Fat Grams of foods Consider  increasing your activity level by going to the gym or walking for 60 minutes 4-5 times a week as tolerated Consider checking BG at alternate times per day as directed by MD  Consider taking medication as directed by MD

## 2015-01-01 NOTE — Progress Notes (Signed)
Patient ID: Krista Benjamin, female   DOB: [birth year], 64 y.o.   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  Last visit 3 mo ago.  She had shingles in 11/2014.   She then had a serious sinus infection.  Husband was in the hospital and had some instances in which she did not take her meds.   Last hemoglobin A1c was:  Lab Results  Component Value Date   HGBA1C 8.1* 09/22/2014   HGBA1C 9.2* 05/15/2014   HGBA1C 7.6* 02/11/2014  Prev. 5.9% on 12/2012, previously 6.1%.  Pt is on a regimen of: - Amaryl 4 mg daily 2x a day  - Tradjenta 5 mg daily - Metformin ER 1000 mg 2x a day We tried Invokana 100 mg in am >> stopped after 3 days >> nausea, diarrhea We stopped Lantus 10 units qhs. We stopped Actos 45 mg (was on it since 07/2001) Metformin 1000 mg bid >> stopped b/c Nausea/Diarrhea Metformin 1000 mg po bid >> did not take in the past, as she had mild CKD. She was on Novolog 8 units tid ac, when sugars >140   Pt checks her sugars 1-2x day and they are high: - am: 80s-120 (occas. 140, 198) >> n/c >> 85-137, but Thanksgiving: 181, 217 >> 157-187 - 2h after b'fast: n/c as not eating b'fast >> 165-212 >> 148-213 >> 153 >> 197-251, 355 - lunch: 99-130 >> 100-180 (238) >> 125 >> n/c >> 242 >> n/c >> 147 - 2h after lunch: 268, 286 >> n/c - dinner >> 130-150 >> 120-170 >> n/c >> 109-228, 276 (Thanksgiving) >> 177 - 2h after dinner: 115-197 (211) >> 174-213 >> 222 >> n/c >> 203-343 - bedtime: 130-235 >> 139-220, 247, 258 (both Thanksgiving - forgot Amaryl) >> 364 - night: 115-164 Lowest CBG: 147; she has hypoglycemia awareness at 70. Highest sugar was 300s.  Pt's meals are: - Breakfast: was skipping, but some smoothies - Lunch: meat + veggies  - Dinner: meat + veggies - Snacks: 1-2, fruit, yoghurt  She lost 80 lbs since 2012 >> could  come off insulin. She used to go to the gym in am, not lately.  - has CKD stage 2. Last BUN/creatinine was:  Lab Results  Component Value Date   BUN 24* 09/22/2014   Lab Results  Component Value Date   CREATININE 0.7 09/22/2014  She is on quinapril. Latest ACR was 9.4 on 12/2012.  - last lipid profile: Lab Results  Component Value Date   CHOL 154 09/22/2014   HDL 46.40 09/22/2014   LDLCALC 98 09/22/2014   TRIG 50.0 09/22/2014   CHOLHDL 3 09/22/2014  She is on Pravastatin. - last eye exam was in 09/17/2014 by Dr. Ashley Royalty: Background diabetic retinopathy; had R cataract sx in 2014. She will have L cataract removal in 01/2015. - no numbness and tingling in her legs  She also has a history of hypertension, hyperlipidemia-on Lipitor, iron deficiency anemia-on iron therapy, aortic valve disease/murmur, left ventricular hypertrophy per EKG, obesity, multinodular goiter, fibroids. Last TSH: Lab Results  Component Value Date   TSH 1.27 09/22/2014   I reviewed pt's medications, allergies, PMH, social hx, family hx, and changes were documented in the history of present illness. Otherwise, unchanged from my initial visit note.  ROS: Constitutional: no weight loss/gain, no fatigue, no subjective hyperthermia/hypothermia, + poor sleep Eyes: no blurry vision, no xerophthalmia  ENT: no sore throat, no nodules palpated in throat, no dysphagia/odynophagia Cardiovascular: no CP/SOB/palpitations/leg swelling Respiratory: no cough/SOB/wheezing Gastrointestinal: no N/V/D/C Musculoskeletal: no muscle/joint aches Skin: no rashes Neurological: no tremors/numbness/tingling/dizziness + low libido  PE: BP 108/62 mmHg  Pulse 60  Temp(Src) 98.2 F (36.8 C) (Oral)  Resp 12  Wt 251 lb (113.853 kg)  SpO2 97% Body mass index is 47.26 kg/(m^2).  Wt Readings from Last 3 Encounters:  01/01/15 251 lb (113.853 kg)  01/01/15 251 lb (113.853 kg)  12/09/14 259 lb (117.482 kg)   Constitutional: obese,  in NAD Eyes: PERRLA, EOMI, no exophthalmos ENT: moist mucous membranes, no thyromegaly, no cervical lymphadenopathy Cardiovascular: RRR, No MRG, + nonpitting edema lower legs (periankle) Respiratory: CTA B Gastrointestinal: abdomen soft, NT, ND, BS+ Musculoskeletal: no deformities, left forearm in sling Skin: moist, warm, stasis dermatitis on legs  ASSESSMENT: 1. DM2, non-insulin-dependent, uncontrolled, with complications - CKD stage 2 - background DR  PLAN:  1.  Patient with long-standing diabetes, with suboptimal control. Sugars higher at night, but some spikes in am, too. At last visit, she was only taking Metformin 500 mg once a day and a second tablet prn (?) >> I advised to increase to 4 tabs a day. She is taking this dose now. She takes Amaryl 8 mg in am but sugars are bedtime are very high >> will split the dose (see before). She again refuses insulin (Lantus)! She will try to get to the gym again and start working on her diet >> also referred her to nutrition and she had the appt after our appt today. - I advised her to:  Patient Instructions  Please continue: - Metformin ER 1000 mg 2x a day - Tradjenta 5 mg daily  Change the Amaryl dose as follows: - Amaryl 4 mg daily 2x a day    Please return in 1.5 months with your sugar log.    Please stop at the lab.   Please schedule an appt with Oran Rein with nutrition.  - continue to check sugars 2x a day, rotating checks times - will check HbA1c today - referred her to nutrition - I will see her back in 1.5 months with her sugar log    Office Visit on 01/01/2015  Component Date Value Ref Range Status  . Hgb A1c MFr Bld 01/01/2015 7.4* 4.6 - 6.5 % Final   Glycemic Control Guidelines for People with Diabetes:Non Diabetic:  <6%Goal of Therapy: <7%Additional Action Suggested:  >8%    Hemoglobin A1c is much better! This is unexplained, since sugars are so high! We may need a fructosamine level at next check.

## 2015-01-08 ENCOUNTER — Telehealth: Payer: Self-pay | Admitting: Family Medicine

## 2015-01-08 NOTE — Telephone Encounter (Signed)
Patient need 30 day re-fill on atorvastatin (LIPITOR) 40 MG tablet sent to [id number] - [place], [place] - [address].

## 2015-01-08 NOTE — Telephone Encounter (Signed)
I called in below script, pt must be waiting on a mail order as well.

## 2015-01-16 ENCOUNTER — Telehealth: Payer: Self-pay | Admitting: Internal Medicine

## 2015-01-16 ENCOUNTER — Other Ambulatory Visit: Payer: Self-pay | Admitting: *Deleted

## 2015-01-16 MED ORDER — LINAGLIPTIN 5 MG PO TABS: 5.0000 mg | ORAL_TABLET | Freq: Every morning | ORAL | Status: DC

## 2015-01-16 NOTE — Telephone Encounter (Signed)
Patient need refill a Tradjenta

## 2015-01-21 ENCOUNTER — Telehealth: Payer: Self-pay | Admitting: Internal Medicine

## 2015-01-21 MED ORDER — GLUCOSE BLOOD VI STRP: ORAL_STRIP | Status: DC

## 2015-01-21 NOTE — Telephone Encounter (Signed)
Pt needs test strips for verio meter [facility] [address]

## 2015-01-28 ENCOUNTER — Other Ambulatory Visit: Payer: Self-pay | Admitting: *Deleted

## 2015-01-28 MED ORDER — GLUCOSE BLOOD VI STRP: ORAL_STRIP | Status: DC

## 2015-01-28 NOTE — Telephone Encounter (Signed)
Pt needs her refill of her test strips sent to [facility]. Done.

## 2015-01-29 ENCOUNTER — Encounter: Payer: Self-pay | Admitting: Gastroenterology

## 2015-02-13 ENCOUNTER — Encounter: Payer: Self-pay | Admitting: Internal Medicine

## 2015-02-13 ENCOUNTER — Ambulatory Visit (INDEPENDENT_AMBULATORY_CARE_PROVIDER_SITE_OTHER): Payer: BLUE CROSS/BLUE SHIELD | Admitting: Internal Medicine

## 2015-02-13 VITALS — BP 122/64 | HR 57 | Temp 98.4°F | Resp 12 | Wt 248.0 lb

## 2015-02-13 DIAGNOSIS — E11319 Type 2 diabetes mellitus with unspecified diabetic retinopathy without macular edema: Secondary | ICD-10-CM

## 2015-02-13 DIAGNOSIS — E1165 Type 2 diabetes mellitus with hyperglycemia: Secondary | ICD-10-CM | POA: Diagnosis not present

## 2015-02-13 DIAGNOSIS — IMO0002 Reserved for concepts with insufficient information to code with codable children: Secondary | ICD-10-CM

## 2015-02-13 NOTE — Patient Instructions (Addendum)
Please continue: - Metformin ER 1000 mg 2x a day - Tradjenta 5 mg daily - Amaryl 4 mg daily 2x a day   Please let me know if the sugars are consistently <90 or >200.   Please return in 3 months with your sugar log.

## 2015-02-13 NOTE — Progress Notes (Signed)
Patient ID: Krista Benjamin, female   DOB: February 01, 1951, 64 y.o.   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  Last visit 1.5 mo ago.  She had cataract removal and implant 01/19/2015.   Last hemoglobin A1c was:  Lab Results  Component Value Date   HGBA1C 7.4* 01/01/2015   HGBA1C 8.1* 09/22/2014   HGBA1C 9.2* 05/15/2014  Prev. 5.9% on 12/2012, previously 6.1%.  Pt is on a regimen of: - Amaryl 4 mg daily 2x a day  - Tradjenta 5 mg daily - Metformin ER 1000 mg 2x a day  We tried Invokana 100 mg in am >> stopped after 3 days >> nausea, diarrhea We stopped Lantus 10 units qhs. We stopped Actos 45 mg (was on it since 07/2001) Metformin 1000 mg bid >> stopped b/c Nausea/Diarrhea Metformin 1000 mg po bid >> did not take in the past, as she had mild CKD. She was on Novolog 8 units tid ac, when sugars >140   Pt checks her sugars 1-2x day - improved after started walking again: - am: 85-137, but Thanksgiving: 181, 217 >> 157-187 >> 83, 119-155, higher before - 2h after b'fast:165-212 >> 148-213 >> 153 >> 197-251, 355 >> n/c - lunch:100-180 (238) >> 125 >> n/c >> 242 >> n/c >> 147 >> 176 - 2h after lunch: 268, 286 >> n/c - dinner: 120-170 >> n/c >> 109-228, 276 (Thanksgiving) >> 177 >> 65, 85 - 2h after dinner: 174-213 >> 222 >> n/c >> 203-343 >> 152-186, before starting walking: 203, 254 - bedtime: 130-235 >> 139-220, 247, 258 >> 364 >> 62, 106, 146 - night: 115-164 >> 227 Lowest CBG: 147 >> 65; she has hypoglycemia awareness at 70. Highest sugar was 300s >> 227 now  Pt's meals are: - Breakfast: was skipping, but some smoothies - Lunch: meat + veggies  - Dinner: meat + veggies - Snacks: 1-2, fruit, yoghurt  She lost 80 lbs since 2012 >> could come off insulin.  At last visit, I referred her to nutrition >> she saw Oran Rein on  01/01/2015. She started exercising, but stopped around the surgery. She started to walk in the [place].  - has CKD stage 2. Last BUN/creatinine was:  Lab Results  Component Value Date   BUN 24* 09/22/2014   Lab Results  Component Value Date   CREATININE 0.7 09/22/2014  She is on quinapril. Latest ACR was 9.4 on 12/2012.  - last lipid profile: Lab Results  Component Value Date   CHOL 154 09/22/2014   HDL 46.40 09/22/2014   LDLCALC 98 09/22/2014   TRIG 50.0 09/22/2014   CHOLHDL 3 09/22/2014  She is on Pravastatin. - last eye exam was in 09/17/2014 by Dr. Ashley Royalty: Background diabetic retinopathy; had R cataract sx in 2014. She had L cataract removal + implant in 01/2015. - no numbness and tingling in her legs  She also has a history of hypertension, hyperlipidemia-on Lipitor, iron deficiency anemia-on iron therapy, aortic valve disease/murmur, left ventricular hypertrophy per EKG, obesity, multinodular goiter, fibroids. Last TSH: Lab Results  Component Value Date   TSH 1.27 09/22/2014   She had shingles in 11/2014.   I reviewed pt's medications, allergies, PMH, social hx, family hx, and changes were documented in the history of present illness. Otherwise, unchanged from my initial visit note.  ROS: Constitutional: no weight loss/gain, no fatigue, no subjective hyperthermia/hypothermia Eyes:  no blurry vision, no xerophthalmia ENT: no sore throat, no nodules palpated in throat, no dysphagia/odynophagia Cardiovascular: no CP/SOB/palpitations/leg swelling Respiratory: no cough/SOB/wheezing Gastrointestinal: no N/V/D/C Musculoskeletal: no muscle/joint aches Skin: no rashes Neurological: no tremors/numbness/tingling/dizziness  PE: BP 122/64 mmHg  Pulse 57  Temp(Src) 98.4 F (36.9 C) (Oral)  Resp 12  Wt 248 lb (112.492 kg)  SpO2 96% Body mass index is 40.05 kg/(m^2).  Wt Readings from Last 3 Encounters:  02/13/15 248 lb (112.492 kg)  01/01/15 251 lb (113.853 kg)  01/01/15  251 lb (113.853 kg)   Constitutional: obese, in NAD Eyes: PERRLA, EOMI, no exophthalmos ENT: moist mucous membranes, no thyromegaly, no cervical lymphadenopathy Cardiovascular: RRR, No MRG, + nonpitting edema lower legs (periankle) Respiratory: CTA B Gastrointestinal: abdomen soft, NT, ND, BS+ Musculoskeletal: no deformities, left forearm in sling Skin: moist, warm, stasis dermatitis on legs  ASSESSMENT: 1. DM2, non-insulin-dependent, uncontrolled, with complications - CKD stage 2 - background DR  PLAN:  1.  Patient with long-standing diabetes, with suboptimal control. Sugars much better after she started to improve diet ad especially after she started to walk daily. She had 1 low, at 64 >> I advised her to let me know if sugars consistently <90 >> will need to reduce the Amaryl in that case. For now, continue current regimen and check more sugars during the day. - I advised her to:  Patient Instructions  Please continue: - Metformin ER 1000 mg 2x a day - Tradjenta 5 mg daily - Amaryl 4 mg daily 2x a day   Please let me know if the sugars are consistently <90 or >200.   Please return in 3 months with your sugar log.  - continue to check sugars 2x a day, rotating checks times - I will see her back in 3 months with her sugar log

## 2015-02-16 ENCOUNTER — Encounter: Payer: Self-pay | Admitting: Family Medicine

## 2015-02-16 ENCOUNTER — Ambulatory Visit (INDEPENDENT_AMBULATORY_CARE_PROVIDER_SITE_OTHER): Payer: BLUE CROSS/BLUE SHIELD | Admitting: Family Medicine

## 2015-02-16 VITALS — BP 159/85 | HR 74 | Temp 98.5°F | Ht 66.0 in | Wt 258.0 lb

## 2015-02-16 DIAGNOSIS — B379 Candidiasis, unspecified: Secondary | ICD-10-CM | POA: Diagnosis not present

## 2015-02-16 MED ORDER — EPINEPHRINE 0.3 MG/0.3ML IJ SOAJ: 0.3000 mg | Freq: Once | INTRAMUSCULAR | Status: DC

## 2015-02-16 MED ORDER — FLUCONAZOLE 100 MG PO TABS: 100.0000 mg | ORAL_TABLET | Freq: Two times a day (BID) | ORAL | Status: DC

## 2015-02-16 MED ORDER — KETOCONAZOLE 2 % EX CREA: 1.0000 "application " | TOPICAL_CREAM | Freq: Two times a day (BID) | CUTANEOUS | Status: DC

## 2015-02-16 NOTE — Progress Notes (Signed)
   Subjective:    Patient ID: Krista Benjamin, female    DOB: 03/31/1951, 64 y.o.   MRN: [id number]  HPI Here for one week of an itchy but tender rash on the abdomen. Using Neosporin.    Review of Systems  Constitutional: Negative.   Skin: Positive for rash.       Objective:   Physical Exam  Constitutional: She appears well-developed and well-nourished.  Skin:  Band of macular erythema under the pannus           Assessment & Plan:  Use oral Diflucan and ketoconazole cream

## 2015-02-16 NOTE — Progress Notes (Signed)
Pre visit review using our clinic review tool, if applicable. No additional management support is needed unless otherwise documented below in the visit note. 

## 2015-04-17 ENCOUNTER — Other Ambulatory Visit: Payer: Self-pay | Admitting: *Deleted

## 2015-04-17 ENCOUNTER — Telehealth: Payer: Self-pay | Admitting: Family Medicine

## 2015-04-17 MED ORDER — FERREX 28 PO TABS: 1.0000 | ORAL_TABLET | Freq: Every day | ORAL | Status: DC

## 2015-04-17 NOTE — Telephone Encounter (Signed)
Pt needs  new rx ferrex 28 [facility] [address]. Pt is out

## 2015-04-17 NOTE — Telephone Encounter (Signed)
Prescription sent in  

## 2015-04-23 ENCOUNTER — Encounter: Payer: Self-pay | Admitting: *Deleted

## 2015-04-27 ENCOUNTER — Encounter: Payer: Self-pay | Admitting: *Deleted

## 2015-05-20 ENCOUNTER — Ambulatory Visit (INDEPENDENT_AMBULATORY_CARE_PROVIDER_SITE_OTHER): Payer: Medicare Other | Admitting: Internal Medicine

## 2015-05-20 ENCOUNTER — Encounter: Payer: Self-pay | Admitting: Internal Medicine

## 2015-05-20 VITALS — BP 138/74 | HR 60 | Temp 98.4°F | Resp 16 | Ht 65.0 in | Wt 253.0 lb

## 2015-05-20 DIAGNOSIS — E1165 Type 2 diabetes mellitus with hyperglycemia: Secondary | ICD-10-CM | POA: Diagnosis not present

## 2015-05-20 DIAGNOSIS — E11319 Type 2 diabetes mellitus with unspecified diabetic retinopathy without macular edema: Secondary | ICD-10-CM

## 2015-05-20 DIAGNOSIS — IMO0002 Reserved for concepts with insufficient information to code with codable children: Secondary | ICD-10-CM

## 2015-05-20 LAB — POCT GLYCOSYLATED HEMOGLOBIN (HGB A1C): Hemoglobin A1C: 7.4

## 2015-05-20 NOTE — Patient Instructions (Signed)
Please continue: - Metformin ER 1000 mg 2x a day - Tradjenta 5 mg daily - Amaryl 4 mg daily 2x a day   Please let me know if the sugars are consistently <90 or >200.   Please return in 3 months with your sugar log.    

## 2015-05-20 NOTE — Progress Notes (Signed)
Patient ID: Krista Benjamin, female   DOB: 04/10/1951, 64 y.o.   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  Last visit 3 mo ago.  She lost 5 lbs since last visit  - gym.  Last hemoglobin A1c was:  Lab Results  Component Value Date   HGBA1C 7.4 05/20/2015   HGBA1C 7.4* 01/01/2015   HGBA1C 8.1* 09/22/2014  Prev. 5.9% on 12/2012, previously 6.1%.  Pt is on a regimen of: - Amaryl 4 mg daily 2x a day  - Tradjenta 5 mg daily - Metformin ER 1000 mg 2x a day (sometimes 3x 500 mg a day)   We tried Invokana 100 mg in am >> stopped after 3 days >> nausea, diarrhea We stopped Lantus 10 units qhs. We stopped Actos 45 mg (was on it since 07/2001) Metformin 1000 mg bid >> stopped b/c Nausea/Diarrhea Metformin 1000 mg po bid >> did not take in the past, as she had mild CKD. She was on Novolog 8 units tid ac, when sugars >140   Pt checks her sugars 1-2x day - improved after losing weight: - am: 85-137, but Thanksgiving: 181, 217 >> 157-187 >> 83, 119-155, higher before >> 64, 73-116 - 2h after b'fast:165-212 >> 148-213 >> 153 >> 197-251, 355 >> n/c >> 77, 104, 113, 180 - lunch:100-180 (238) >> 125 >> n/c >> 242 >> n/c >> 147 >> 176 >> 74-152 - 2h after lunch: 268, 286 >> n/c >> 83, 183 - dinner: 120-170 >> n/c >> 109-228, 276 (Thanksgiving) >> 177 >> 65, 85 >> 88-150 - 2h after dinner: 174-213 >> 222 >> n/c >> 203-343 >> 152-186, before starting walking: 203, 254 >> 158, 176 - bedtime: 130-235 >> 139-220, 247, 258 >> 364 >> 62, 106, 146 >> 85, 101-167 - night: 115-164 >> 227 >> 97, 124, 144 Lowest CBG: 147 >> 65 >> 64; she has hypoglycemia awareness at 70. Highest sugar was 300s >> 227 >> 180.   Pt's meals are: - Breakfast: was skipping, but some smoothies - Lunch: meat + veggies  - Dinner: meat + veggies - Snacks: 1-2, fruit,  yoghurt  She lost 80 lbs since 2012 >> could come off insulin.  At last visit, I referred her to nutrition >> she saw Oran Rein on 01/01/2015.  - has CKD stage 2. Last BUN/creatinine was:  Lab Results  Component Value Date   BUN 24* 09/22/2014   Lab Results  Component Value Date   CREATININE 0.7 09/22/2014  She is on quinapril. Latest ACR was 9.4 on 12/2012.  - last lipid profile: Lab Results  Component Value Date   CHOL 154 09/22/2014   HDL 46.40 09/22/2014   LDLCALC 98 09/22/2014   TRIG 50.0 09/22/2014   CHOLHDL 3 09/22/2014  She is on Pravastatin. - last eye exam was in 09/17/2014 by Dr. Ashley Royalty: Background diabetic retinopathy; had R cataract sx in 2014. She had L cataract removal + implant in 01/2015. - no numbness and tingling in her legs  She also has a history of hypertension, hyperlipidemia-on Lipitor, iron deficiency anemia-on iron therapy, aortic valve disease/murmur, left ventricular hypertrophy per EKG, obesity, multinodular goiter, fibroids. Last TSH: Lab Results  Component Value Date   TSH 1.27 09/22/2014   She had shingles in 11/2014.   I reviewed pt's medications, allergies, PMH, social hx, family hx, and changes were documented in the history  of present illness. Otherwise, unchanged from my initial visit note.  ROS: Constitutional: + weight loss, no fatigue, no subjective hyperthermia/hypothermia Eyes: no blurry vision, no xerophthalmia ENT: no sore throat, no nodules palpated in throat, no dysphagia/odynophagia Cardiovascular: no CP/SOB/palpitations/leg swelling Respiratory: no cough/SOB/wheezing Gastrointestinal: no N/V/D/C Musculoskeletal: no muscle/joint aches Skin: no rashes Neurological: no tremors/numbness/tingling/dizziness  PE: BP 138/74 mmHg  Pulse 60  Temp(Src) 98.4 F (36.9 C) (Oral)  Resp 16  Ht  (1.651 m)  Wt 253 lb (114.76 kg)  BMI 42.10 kg/m2  SpO2 95% Body mass index is 42.1 kg/(m^2).  Wt Readings from Last 3  Encounters:  05/20/15 253 lb (114.76 kg)  02/16/15 258 lb (117.028 kg)  02/13/15 248 lb (112.492 kg)   Constitutional: obese, in NAD Eyes: PERRLA, EOMI, no exophthalmos ENT: moist mucous membranes, no thyromegaly, no cervical lymphadenopathy Cardiovascular: RRR, No MRG, + nonpitting edema lower legs (periankle) Respiratory: CTA B Gastrointestinal: abdomen soft, NT, ND, BS+ Musculoskeletal: no deformities, left forearm in sling Skin: moist, warm, stasis dermatitis on legs  ASSESSMENT: 1. DM2, non-insulin-dependent, uncontrolled, with complications - CKD stage 2 - background DR  PLAN:  1.  Patient with long-standing diabetes, with suboptimal control. Sugars much better after she started to improve diet ad especially after she started to exercise.  For now, continue current regimen. - I advised her to:  Patient Instructions  Please continue: - Metformin ER 1000 mg 2x a day - Tradjenta 5 mg daily - Amaryl 4 mg daily 2x a day   Please let me know if the sugars are consistently <90 or >200.   Please return in 3 months with your sugar log.  - continue to check sugars 2x a day, rotating checks times - check HbA1c today >> 7.4% (stable) - I believe the next one would be better. - I will see her back in 3 months with her sugar log

## 2015-05-27 ENCOUNTER — Ambulatory Visit (INDEPENDENT_AMBULATORY_CARE_PROVIDER_SITE_OTHER): Payer: Medicare Other | Admitting: Physician Assistant

## 2015-05-27 VITALS — BP 140/62 | HR 65 | Temp 98.2°F | Resp 18 | Ht 65.0 in | Wt 263.0 lb

## 2015-05-27 DIAGNOSIS — H02843 Edema of right eye, unspecified eyelid: Secondary | ICD-10-CM

## 2015-05-27 DIAGNOSIS — E11319 Type 2 diabetes mellitus with unspecified diabetic retinopathy without macular edema: Secondary | ICD-10-CM

## 2015-05-27 DIAGNOSIS — T7840XA Allergy, unspecified, initial encounter: Secondary | ICD-10-CM

## 2015-05-27 DIAGNOSIS — E1165 Type 2 diabetes mellitus with hyperglycemia: Secondary | ICD-10-CM

## 2015-05-27 DIAGNOSIS — IMO0002 Reserved for concepts with insufficient information to code with codable children: Secondary | ICD-10-CM

## 2015-05-27 MED ORDER — METHYLPREDNISOLONE ACETATE 80 MG/ML IJ SUSP
80.0000 mg | Freq: Once | INTRAMUSCULAR | Status: AC
  Administered 2015-05-27: 80 mg via INTRAMUSCULAR

## 2015-05-27 NOTE — Patient Instructions (Addendum)
You need to check your sugar at least daily for the next week. Call if your sugar gets about 200-250. Sugars should come back down to normal in 7-10 days. You can take zyrtec daily for itching/allergic reaction. You may apply only vaseline to the skin around your eye, stop other creams you have been using at home. Return if symptoms not improving in 1 week.

## 2015-05-27 NOTE — Progress Notes (Signed)
[facility] [address], [place] [address] [phone]  Date:  05/27/2015   Name:  Krista Benjamin   DOB:  [birth year]   MRN:  [id number]  PCP:  Nelwyn Salisbury, MD    Chief Complaint: Allergic Reaction   History of Present Illness:  This is a 64 y.o. female with PMH HTN, HLD, DM2 who is presenting with face swelling and itching that started 2 days ago. She did yard work 1 week before it happened. She did nothing in the 2 days prior to symptom onset that was unusual. She has no known allergies. Swelling is located to right eyelids. The eye is very itchy. The left eye is a little itchy too but no swelling. She is not having any lip or oral swelling. No problems breathing. She has one small erythematous spot on her left forearm that is itchy as swell. No other pruritic lesions. She has DM2 - last A1C 1 week ago 7.4. She checks her sugar daily. FBG usually 80s-100s.    Review of Systems:  Review of Systems See HPI  Patient Active Problem List   Diagnosis Date Noted  . Bronchitis with bronchospasm 03/16/2013  . AORTIC VALVE DISORDERS 04/26/2010  . OBESITY, UNSPECIFIED 05/07/2009  . MURMUR 05/07/2009  . CHEST PAIN 05/07/2009  . LOW BACK PAIN 01/09/2008  . ANEMIA-IRON DEFICIENCY 11/21/2007  . WEAKNESS 11/21/2007  . Type 2 diabetes, uncontrolled, with retinopathy 10/26/2007  . HYPERLIPIDEMIA 10/26/2007  . HYPERTENSION 10/26/2007    Prior to Admission medications   Medication Sig Start Date End Date Taking? Authorizing Provider  aspirin 81 MG tablet Take 81 mg by mouth at bedtime.    Yes Historical Provider, MD  atorvastatin (LIPITOR) 40 MG tablet Take 1 tablet (40 mg total) by mouth at bedtime. 09/29/14  Yes Nelwyn Salisbury, MD  BESIVANCE 0.6 % SUSP  01/11/15  Yes Historical Provider, MD  bumetanide (BUMEX) 1 MG tablet Take 2 tablets (2 mg total) by mouth at bedtime. 09/29/14  Yes Nelwyn Salisbury, MD  cholecalciferol (VITAMIN D) 1000 UNITS tablet Take 2,000  Units by mouth at bedtime.    Yes Historical Provider, MD  diltiazem (DILACOR XR) 240 MG 24 hr capsule Take 1 capsule (240 mg total) by mouth daily. 09/29/14  Yes Nelwyn Salisbury, MD  FeAspGl-FeFum-[id number]-Succ Ac [facility] 28) TABS Take 1 tablet by mouth daily. 04/17/15  Yes Nelwyn Salisbury, MD  fluconazole (DIFLUCAN) 100 MG tablet Take 1 tablet (100 mg total) by mouth 2 (two) times daily. 02/16/15  Yes Nelwyn Salisbury, MD  glimepiridev (AMARYL) 4 MG tablet Take 1 tablet (4 mg total) by mouth 2 (two) times daily. 01/01/15  Yes Carlus Pavlov, MD  Glucose Blood (BLOOD GLUCOSE TEST STRIPS) STRP Check 3x a day 02/24/14  Yes Carlus Pavlov, MD  glucose blood (ONETOUCH VERIO) test strip Use to test blood sugar 2 times daily as instructed. Dx: G95.621 01/28/15  Yes Carlus Pavlov, MD  ketoconazole (NIZORAL) 2 % cream Apply 1 application topically 2 (two) times daily. 02/16/15  Yes Nelwyn Salisbury, MD  linagliptin (TRADJENTA) 5 MG TABS tablet Take 1 tablet (5 mg total) by mouth every morning. 01/16/15  Yes Carlus Pavlov, MD  metFORMIN (GLUCOPHAGE-XR) 500 MG 24 hr tablet Take 2 tablets (1,000 mg total) by mouth 2 (two) times daily with a meal. 06/30/14  Yes Carlus Pavlov, MD  metoprolol (LOPRESSOR) 50 MG tablet Take 1 tablet (50 mg total) by mouth daily. 09/29/14  Yes Nelwyn Salisbury, MD  quinapril (ACCUPRIL) 20 MG tablet Take 1 tablet (20 mg total) by mouth at bedtime. 09/29/14  Yes Nelwyn Salisbury, MD  VENTOLIN HFA 108 (90 BASE) MCG/ACT inhaler INHALE 2 PUFFS INTO THE LUNGS EVERY 4 (FOUR) HOURS AS NEEDED FOR WHEEZING. 12/10/14  Yes Nelwyn Salisbury, MD                  No Known Allergies  Past Surgical History  Procedure Laterality Date  . Excision of benign cyst      from left maxilla and left maxillary sinus  . Colonoscopy  11-26-07    per Dr. Jarold Motto, repeat in 10 yrs   . Knee surgery    . Shoulder arthroscopy w/ rotator cuff repair Left 09-23-13    per Dr. Delrae Sawyers at [facility] center in  [place]   . Carpal tunnel release  01/22/14    Left Hand   . Cataract extraction      Lens surgery    Social History  Substance Use Topics  . Smoking status: Never Smoker   . Smokeless tobacco: Never Used  . Alcohol Use: No    Family History  Problem Relation Age of Onset  . Coronary artery disease Mother   . Coronary artery disease Brother   . Cancer      fhx  . Diabetes      fhx  . Hyperlipidemia      fhx  . Hypertension      fhx  . Stroke      fhx    Medication list has been reviewed and updated.  Physical Examination:  Physical Exam  Constitutional: She is oriented to person, place, and time. She appears well-developed and well-nourished. No distress.  HENT:  Head: Normocephalic and atraumatic.  Right Ear: Hearing normal.  Left Ear: Hearing normal.  Nose: Nose normal.  Mouth/Throat: Uvula is midline, oropharynx is clear and moist and mucous membranes are normal.  No oral swelling  Eyes: Conjunctivae and EOM are normal. Pupils are equal, round, and reactive to light. Right eye exhibits no discharge. Left eye exhibits no discharge. No scleral icterus.  Right eye with upper lid and lower lid erythema and swelling. Skin with mild dryness and scaling. Not TTP. No pain with EOM. Conjunctiva normal. Left eye normal. No other facial swelling present.  Cardiovascular: Normal rate, regular rhythm, normal heart sounds and normal pulses.   No murmur heard. Pulmonary/Chest: Effort normal and breath sounds normal. No respiratory distress. She has no wheezes. She has no rhonchi. She has no rales.  Musculoskeletal: Normal range of motion.  Neurological: She is alert and oriented to person, place, and time.  Skin: Skin is warm, dry and intact. No lesion and no rash noted.  Psychiatric: She has a normal mood and affect. Her speech is normal and behavior is normal. Thought content normal.    BP 140/62 mmHg  Pulse 65  Temp(Src) 98.2 F (36.8 C) (Oral)  Resp 18  Ht 5\' 5"   (1.651 m)  Wt 263 lb (119.296 kg)  BMI 43.77 kg/m2  SpO2 98%  Assessment and Plan:  1. Allergic reaction, initial encounter 2. Swelling of eyelid, right 3. Type 2 DM Pt with allergic reaction, although etiology unknown. Depo-medrol injection given in office. Pt's A1C 7.4 and FBG levels normal. She very rarely has BG >200. She check BG at least daily for next 1 week. If >250, will call the office. Advised if BG not  normalizing in 7-10 days, call PCP or endocrinologist. She may take zyrtec daily for allergic sx as well. Return in 1 week if sx not improving or at any time if sx worsening. Return precautions discussed. - methylPREDNISolone acetate (DEPO-MEDROL) injection 80 mg; Inject 1 mL (80 mg total) into the muscle once.    [place] Brackett, MHS [facility]  05/27/2015

## 2015-05-28 NOTE — Progress Notes (Signed)
  Medical screening examination/treatment/procedure(s) were performed by non-physician practitioner and as supervising physician I was immediately available for consultation/collaboration.     

## 2015-06-26 ENCOUNTER — Ambulatory Visit (INDEPENDENT_AMBULATORY_CARE_PROVIDER_SITE_OTHER): Payer: Medicare Other | Admitting: Family Medicine

## 2015-06-26 DIAGNOSIS — Z23 Encounter for immunization: Secondary | ICD-10-CM

## 2015-06-29 DIAGNOSIS — Z124 Encounter for screening for malignant neoplasm of cervix: Secondary | ICD-10-CM | POA: Diagnosis not present

## 2015-06-29 DIAGNOSIS — Z6841 Body Mass Index (BMI) 40.0 and over, adult: Secondary | ICD-10-CM | POA: Diagnosis not present

## 2015-06-29 DIAGNOSIS — Z01419 Encounter for gynecological examination (general) (routine) without abnormal findings: Secondary | ICD-10-CM | POA: Diagnosis not present

## 2015-07-20 ENCOUNTER — Other Ambulatory Visit: Payer: Self-pay | Admitting: Internal Medicine

## 2015-07-27 DIAGNOSIS — Z1231 Encounter for screening mammogram for malignant neoplasm of breast: Secondary | ICD-10-CM | POA: Diagnosis not present

## 2015-07-31 ENCOUNTER — Ambulatory Visit (INDEPENDENT_AMBULATORY_CARE_PROVIDER_SITE_OTHER): Payer: Medicare Other | Admitting: Family Medicine

## 2015-07-31 ENCOUNTER — Ambulatory Visit (INDEPENDENT_AMBULATORY_CARE_PROVIDER_SITE_OTHER): Payer: Medicare Other

## 2015-07-31 DIAGNOSIS — M5441 Lumbago with sciatica, right side: Secondary | ICD-10-CM

## 2015-07-31 DIAGNOSIS — M542 Cervicalgia: Secondary | ICD-10-CM

## 2015-07-31 DIAGNOSIS — R531 Weakness: Secondary | ICD-10-CM | POA: Diagnosis not present

## 2015-07-31 DIAGNOSIS — E11319 Type 2 diabetes mellitus with unspecified diabetic retinopathy without macular edema: Secondary | ICD-10-CM

## 2015-07-31 DIAGNOSIS — M5442 Lumbago with sciatica, left side: Secondary | ICD-10-CM

## 2015-07-31 DIAGNOSIS — E1165 Type 2 diabetes mellitus with hyperglycemia: Secondary | ICD-10-CM | POA: Diagnosis not present

## 2015-07-31 MED ORDER — CYCLOBENZAPRINE HCL 5 MG PO TABS: 5.0000 mg | ORAL_TABLET | Freq: Three times a day (TID) | ORAL | Status: DC | PRN

## 2015-07-31 MED ORDER — HYDROCODONE-ACETAMINOPHEN 5-325 MG PO TABS: 1.0000 | ORAL_TABLET | Freq: Four times a day (QID) | ORAL | Status: DC | PRN

## 2015-07-31 MED ORDER — IBUPROFEN 600 MG PO TABS: 600.0000 mg | ORAL_TABLET | Freq: Three times a day (TID) | ORAL | Status: DC | PRN
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
## 2015-08-02 ENCOUNTER — Telehealth: Payer: Self-pay | Admitting: Family Medicine

## 2015-08-02 NOTE — Telephone Encounter (Addendum)
Patient needs further instruction on how she is supposed to use her medications. She states that she was give triglycerin suppositories and her pharmacist told her that it could not be used while taking another medication that she is on. Can someone help her with this?   [phone]

## 2015-08-03 NOTE — Telephone Encounter (Signed)
Dr Shaw please advise. 

## 2015-08-03 NOTE — Telephone Encounter (Signed)
Great, pt seen and discussed. Reviewed with her that the glycerin suppositories are a 1 time product if having trouble passing large or painful stool. Ok to [employer], review w/ pcp if she continues

## 2015-08-03 NOTE — Telephone Encounter (Signed)
Pt was not given any suppositories. As it says in her pt instructions, she should use miralax or colace once to twice a day.  If her doctor has her on something else for constipation, then she should talk with that rxing physician. She should use the nsaid during the day, the flexeril at night, and then can use the hydrocodone as needed for breakthrough pain but be careful with this since can cause confusion/lightheadedness/imbalance. This was reviewed in detail at time of visit. See AVS for additional instructions re her recovery

## 2015-08-03 NOTE — Telephone Encounter (Signed)
Spoke with pt, advised message from Dr. Clelia Croft. SHe states it is not typed on the paper but Dr. Clelia Croft wrote in her handwriting to use glycerin suppositories. I advised her to not use those and to use the colace or miralax instead. She states she was going to try to bring the paper up here to show what was written on her paper. FYI

## 2015-08-06 ENCOUNTER — Other Ambulatory Visit: Payer: Self-pay | Admitting: Internal Medicine

## 2015-08-07 ENCOUNTER — Encounter: Payer: BC Managed Care – PPO | Admitting: Family Medicine

## 2015-08-15 ENCOUNTER — Other Ambulatory Visit: Payer: Self-pay | Admitting: Internal Medicine

## 2015-08-21 ENCOUNTER — Telehealth: Payer: Self-pay | Admitting: Family Medicine

## 2015-08-21 NOTE — Telephone Encounter (Signed)
Pt said [facility] can not [facility] her meds out for 5 days and is asking if a weeks worth can be sent to the pharmacy below   atorvastatin (LIPITOR) 40 MG tablet quinapril (ACCUPRIL) 20 MG tablet , diltiazem (DILACOR XR) 240 MG 24 hr capsule ,    Pharmacy [facility]

## 2015-08-25 MED ORDER — ATORVASTATIN CALCIUM 40 MG PO TABS: 40.0000 mg | ORAL_TABLET | Freq: Every day | ORAL | Status: DC

## 2015-08-25 MED ORDER — DILTIAZEM HCL ER 240 MG PO CP24: 240.0000 mg | ORAL_CAPSULE | Freq: Every day | ORAL | Status: DC

## 2015-08-25 MED ORDER — QUINAPRIL HCL 20 MG PO TABS: 20.0000 mg | ORAL_TABLET | Freq: Every day | ORAL | Status: DC

## 2015-08-25 NOTE — Telephone Encounter (Signed)
Per Dr. Clent Ridges okay to send in a 30 day supply and I did.

## 2015-08-26 ENCOUNTER — Encounter: Payer: Self-pay | Admitting: Internal Medicine

## 2015-08-26 ENCOUNTER — Other Ambulatory Visit (INDEPENDENT_AMBULATORY_CARE_PROVIDER_SITE_OTHER): Payer: BLUE CROSS/BLUE SHIELD | Admitting: *Deleted

## 2015-08-26 ENCOUNTER — Ambulatory Visit (INDEPENDENT_AMBULATORY_CARE_PROVIDER_SITE_OTHER): Payer: Medicare Other | Admitting: Internal Medicine

## 2015-08-26 VITALS — BP 128/64 | HR 71 | Temp 98.1°F | Resp 12 | Wt 256.6 lb

## 2015-08-26 DIAGNOSIS — E11311 Type 2 diabetes mellitus with unspecified diabetic retinopathy with macular edema: Secondary | ICD-10-CM

## 2015-08-26 DIAGNOSIS — E1165 Type 2 diabetes mellitus with hyperglycemia: Secondary | ICD-10-CM

## 2015-08-26 DIAGNOSIS — E11319 Type 2 diabetes mellitus with unspecified diabetic retinopathy without macular edema: Secondary | ICD-10-CM | POA: Diagnosis not present

## 2015-08-26 LAB — LIPID PANEL
Cholesterol: 170 mg/dL (ref 0–200)
HDL: 47.9 mg/dL (ref >39.00)
LDL Cholesterol: 103 mg/dL — ABNORMAL HIGH (ref 0–99)
NonHDL: 121.67
Total CHOL/HDL Ratio: 4
Triglycerides: 93 mg/dL (ref 0.0–149.0)
VLDL: 18.6 mg/dL (ref 0.0–40.0)

## 2015-08-26 LAB — COMPREHENSIVE METABOLIC PANEL
ALT: 10 U/L (ref 0–35)
AST: 14 U/L (ref 0–37)
Albumin: 4 g/dL (ref 3.5–5.2)
Alkaline Phosphatase: 74 U/L (ref 39–117)
BUN: 24 mg/dL — ABNORMAL HIGH (ref 6–23)
CO2: 32 mEq/L (ref 19–32)
Calcium: 9.9 mg/dL (ref 8.4–10.5)
Chloride: 103 mEq/L (ref 96–112)
Creatinine, Ser: 0.86 mg/dL (ref 0.40–1.20)
GFR: 85.07 mL/min (ref >60.00)
Glucose, Bld: 111 mg/dL — ABNORMAL HIGH (ref 70–99)
Potassium: 4.4 mEq/L (ref 3.5–5.1)
Sodium: 141 mEq/L (ref 135–145)
Total Bilirubin: 0.5 mg/dL (ref 0.2–1.2)
Total Protein: 7.7 g/dL (ref 6.0–8.3)

## 2015-08-26 LAB — MICROALBUMIN / CREATININE URINE RATIO
Creatinine,U: 201.9 mg/dL
Microalb Creat Ratio: 2.3 mg/g (ref 0.0–30.0)
Microalb, Ur: 4.6 mg/dL — ABNORMAL HIGH (ref 0.0–1.9)

## 2015-08-26 LAB — POCT GLYCOSYLATED HEMOGLOBIN (HGB A1C): Hemoglobin A1C: 7.5
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
## 2015-08-26 NOTE — Patient Instructions (Signed)
Please continue: - Metformin ER 1000 mg 2x a day - Tradjenta 5 mg daily - Amaryl 4 mg 2x a day, however, increase the dose to 6 mg (1.5 tablets) before a larger dinner.  Please let me know if the sugars are consistently <90 or >200.  Please stop at the lab.   Please return in 3 months with your sugar log.

## 2015-09-29 ENCOUNTER — Ambulatory Visit (INDEPENDENT_AMBULATORY_CARE_PROVIDER_SITE_OTHER): Payer: Medicare Other | Admitting: Ophthalmology

## 2015-09-29 DIAGNOSIS — I10 Essential (primary) hypertension: Secondary | ICD-10-CM | POA: Diagnosis not present

## 2015-09-29 DIAGNOSIS — E113393 Type 2 diabetes mellitus with moderate nonproliferative diabetic retinopathy without macular edema, bilateral: Secondary | ICD-10-CM | POA: Diagnosis not present

## 2015-09-29 DIAGNOSIS — H43813 Vitreous degeneration, bilateral: Secondary | ICD-10-CM | POA: Diagnosis not present

## 2015-09-29 DIAGNOSIS — E11319 Type 2 diabetes mellitus with unspecified diabetic retinopathy without macular edema: Secondary | ICD-10-CM | POA: Diagnosis not present

## 2015-09-29 DIAGNOSIS — H35033 Hypertensive retinopathy, bilateral: Secondary | ICD-10-CM | POA: Diagnosis not present

## 2015-09-29 LAB — HM DIABETES EYE EXAM

## 2015-09-30 ENCOUNTER — Telehealth: Payer: Self-pay

## 2015-09-30 NOTE — Telephone Encounter (Signed)
Waiting on payment of $12.75 for 17 pages  Jeanell Sparrow and Dareen Piano.

## 2015-10-08 DIAGNOSIS — Z0271 Encounter for disability determination: Secondary | ICD-10-CM

## 2015-10-08 NOTE — Telephone Encounter (Signed)
Payment received and records sent on 10/08/15

## 2015-10-09 ENCOUNTER — Encounter: Payer: Self-pay | Admitting: Internal Medicine

## 2015-10-13 ENCOUNTER — Telehealth: Payer: Self-pay | Admitting: Family Medicine

## 2015-10-13 NOTE — Telephone Encounter (Signed)
Rx called into [place] [facility].

## 2015-10-13 NOTE — Telephone Encounter (Signed)
Call in a 30 day supply to the new [place] pharmacy

## 2015-10-13 NOTE — Telephone Encounter (Signed)
Patient is on her way to [place] to attend a funeral.  She needs at 30 day refill of TRADJENTA 5 MG TABS tablet sent to [facility] on [address] in [place].

## 2015-10-13 NOTE — Telephone Encounter (Signed)
Ok to refill 

## 2015-10-18 ENCOUNTER — Other Ambulatory Visit: Payer: Self-pay | Admitting: Family Medicine

## 2015-10-26 ENCOUNTER — Other Ambulatory Visit: Payer: Self-pay | Admitting: Family Medicine

## 2015-11-12 ENCOUNTER — Other Ambulatory Visit: Payer: Self-pay | Admitting: Family Medicine

## 2015-11-12 NOTE — Telephone Encounter (Signed)
Can we refill this? 

## 2015-11-12 NOTE — Telephone Encounter (Signed)
Refill for one year 

## 2015-11-13 ENCOUNTER — Other Ambulatory Visit: Payer: Self-pay | Admitting: Internal Medicine

## 2015-11-16 ENCOUNTER — Other Ambulatory Visit: Payer: Self-pay | Admitting: Internal Medicine

## 2015-11-16 ENCOUNTER — Telehealth: Payer: Self-pay | Admitting: Internal Medicine

## 2015-11-16 MED ORDER — GLIMEPIRIDE 4 MG PO TABS: ORAL_TABLET | ORAL | Status: DC

## 2015-11-16 NOTE — Telephone Encounter (Signed)
Pt has questions about glimiperide

## 2015-11-16 NOTE — Telephone Encounter (Signed)
Unable to reach pt. Lvm advising her that we would be sending in a new rx with the dosage change per last office visit. Advised pt to call back tomorrow with any questions.

## 2015-11-19 ENCOUNTER — Other Ambulatory Visit: Payer: Self-pay | Admitting: Family Medicine

## 2015-11-27 ENCOUNTER — Other Ambulatory Visit (INDEPENDENT_AMBULATORY_CARE_PROVIDER_SITE_OTHER): Payer: Medicare Other | Admitting: *Deleted

## 2015-11-27 ENCOUNTER — Encounter: Payer: Self-pay | Admitting: Internal Medicine

## 2015-11-27 ENCOUNTER — Ambulatory Visit (INDEPENDENT_AMBULATORY_CARE_PROVIDER_SITE_OTHER): Payer: Medicare Other | Admitting: Internal Medicine

## 2015-11-27 VITALS — BP 118/70 | HR 65 | Temp 98.2°F | Resp 12 | Wt 251.0 lb

## 2015-11-27 DIAGNOSIS — E11311 Type 2 diabetes mellitus with unspecified diabetic retinopathy with macular edema: Secondary | ICD-10-CM | POA: Diagnosis not present

## 2015-11-27 DIAGNOSIS — E11319 Type 2 diabetes mellitus with unspecified diabetic retinopathy without macular edema: Secondary | ICD-10-CM | POA: Diagnosis not present

## 2015-11-27 DIAGNOSIS — E1165 Type 2 diabetes mellitus with hyperglycemia: Secondary | ICD-10-CM

## 2015-11-27 DIAGNOSIS — IMO0002 Reserved for concepts with insufficient information to code with codable children: Secondary | ICD-10-CM | POA: Insufficient documentation

## 2015-11-27 LAB — POCT GLYCOSYLATED HEMOGLOBIN (HGB A1C): Hemoglobin A1C: 8.1

## 2015-11-27 NOTE — Patient Instructions (Signed)
Patient Instructions  Please continue: - Metformin ER 1000 mg 2x a day - Tradjenta 5 mg daily - Amaryl 4 mg 2x a day (move the dinnertime dose 15-30 min before meals)  Please increase the dose to 6 mg (1.5 tablets) before a larger dinner if still needed after you move Amaryl before dinner.  Please let me know if the sugars are consistently <90 or >200.   Please return in 3 months with your sugar log.
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
## 2016-01-14 ENCOUNTER — Other Ambulatory Visit: Payer: Self-pay | Admitting: Family Medicine

## 2016-01-14 NOTE — Telephone Encounter (Signed)
I spoke with pharmacy and pt has refills, also spoke with pt.

## 2016-01-14 NOTE — Telephone Encounter (Signed)
Pt states she cannot get any refills form her mail order until she pays the bill. Pt would like a 30 day sent to local pharmacy  [id number] Ac (FERREX 28) TABS To [facility] [address]

## 2016-02-14 ENCOUNTER — Other Ambulatory Visit: Payer: Self-pay | Admitting: Internal Medicine

## 2016-02-23 ENCOUNTER — Other Ambulatory Visit: Payer: Self-pay | Admitting: Internal Medicine

## 2016-02-26 ENCOUNTER — Other Ambulatory Visit (INDEPENDENT_AMBULATORY_CARE_PROVIDER_SITE_OTHER): Payer: Medicare Other | Admitting: *Deleted

## 2016-02-26 ENCOUNTER — Encounter: Payer: Self-pay | Admitting: Internal Medicine

## 2016-02-26 ENCOUNTER — Ambulatory Visit (INDEPENDENT_AMBULATORY_CARE_PROVIDER_SITE_OTHER): Payer: Medicare Other | Admitting: Internal Medicine

## 2016-02-26 ENCOUNTER — Telehealth: Payer: Self-pay | Admitting: Family Medicine

## 2016-02-26 VITALS — BP 116/70 | HR 56 | Wt 251.0 lb

## 2016-02-26 DIAGNOSIS — E1165 Type 2 diabetes mellitus with hyperglycemia: Secondary | ICD-10-CM

## 2016-02-26 DIAGNOSIS — E11319 Type 2 diabetes mellitus with unspecified diabetic retinopathy without macular edema: Secondary | ICD-10-CM | POA: Diagnosis not present

## 2016-02-26 LAB — POCT GLYCOSYLATED HEMOGLOBIN (HGB A1C): Hemoglobin A1C: 7.9

## 2016-02-26 NOTE — Patient Instructions (Signed)
Please continue: - Metformin ER 1000 mg 2x a day - Tradjenta 5 mg daily - Amaryl 4 mg 2x a day - take 6 mg (1.5 tablets) before a larger dinner.  Please let me know if the sugars are consistently <90 or >200.   Please return in 3 months with your sugar log.

## 2016-02-26 NOTE — Telephone Encounter (Signed)
I spoke with pt, she was concerned that new script she picked up said ER instead of XR. Per Dr. Clent Ridges this is the same medication, advise pt to make sure the correct name & dose is the same on prescription. We spoke again and everything is the same.

## 2016-02-26 NOTE — Telephone Encounter (Signed)
Pt is requesting a call back to discuss the following med [id number]    [phone]
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
## 2016-03-31 LAB — HM DIABETES EYE EXAM

## 2016-04-19 ENCOUNTER — Other Ambulatory Visit: Payer: Self-pay | Admitting: Family Medicine

## 2016-04-19 NOTE — Telephone Encounter (Signed)
Refill sent to pharmacy.   
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
## 2016-05-16 ENCOUNTER — Encounter: Payer: Self-pay | Admitting: Cardiology

## 2016-05-16 ENCOUNTER — Ambulatory Visit (INDEPENDENT_AMBULATORY_CARE_PROVIDER_SITE_OTHER): Payer: Medicare Other | Admitting: Cardiology

## 2016-05-16 VITALS — BP 162/82 | HR 60 | Ht 65.0 in | Wt 259.4 lb

## 2016-05-16 DIAGNOSIS — I700 Atherosclerosis of aorta: Secondary | ICD-10-CM | POA: Diagnosis not present

## 2016-05-16 DIAGNOSIS — I10 Essential (primary) hypertension: Secondary | ICD-10-CM

## 2016-05-16 DIAGNOSIS — IMO0001 Reserved for inherently not codable concepts without codable children: Secondary | ICD-10-CM

## 2016-05-16 DIAGNOSIS — R0989 Other specified symptoms and signs involving the circulatory and respiratory systems: Secondary | ICD-10-CM

## 2016-05-16 NOTE — Patient Instructions (Signed)
Medication Instructions:  Continue current medications  Labwork: NONE  Testing/Procedures: Your physician has requested that you have a carotid duplex. This test is an ultrasound of the carotid arteries in your neck. It looks at blood flow through these arteries that supply the brain with blood. Allow one hour for this exam. There are no restrictions or special instructions.  Follow-Up: Your physician wants you to follow-up in: 2 Year. You will receive a reminder letter in the mail two months in advance. If you don't receive a letter, please call our office to schedule the follow-up appointment.   Any Other Special Instructions Will Be Listed Below (If Applicable).   If you need a refill on your cardiac medications before your next appointment, please call your pharmacy.

## 2016-05-19 ENCOUNTER — Ambulatory Visit (HOSPITAL_COMMUNITY)
Admission: RE | Admit: 2016-05-19 | Discharge: 2016-05-19 | Disposition: A | Discharge status: Home / Self Care | Payer: Medicare Other | Source: Ambulatory Visit | Attending: Cardiology | Admitting: Cardiology

## 2016-05-19 DIAGNOSIS — E119 Type 2 diabetes mellitus without complications: Secondary | ICD-10-CM | POA: Insufficient documentation

## 2016-05-19 DIAGNOSIS — I6523 Occlusion and stenosis of bilateral carotid arteries: Secondary | ICD-10-CM | POA: Diagnosis not present

## 2016-05-19 DIAGNOSIS — E785 Hyperlipidemia, unspecified: Secondary | ICD-10-CM | POA: Diagnosis not present

## 2016-05-19 DIAGNOSIS — I10 Essential (primary) hypertension: Secondary | ICD-10-CM | POA: Insufficient documentation

## 2016-05-19 DIAGNOSIS — R0989 Other specified symptoms and signs involving the circulatory and respiratory systems: Secondary | ICD-10-CM | POA: Diagnosis present

## 2016-05-23 ENCOUNTER — Telehealth: Payer: Self-pay | Admitting: Cardiology

## 2016-05-23 NOTE — Telephone Encounter (Signed)
Returned call to patient. Notified her of carotid doppler results that were also released to MyChart  She wanted to know if she is OK to have wisdom teeth removed on Friday  She goes for pre-op tomorrow and will find out if she needs to hold any medications - she is on asa 81mg   Will route to MD

## 2016-05-23 NOTE — Telephone Encounter (Signed)
Pt would like her doppler results from last week please.

## 2016-05-24 NOTE — Telephone Encounter (Signed)
OK for wisdom teeth removal.  Dr. Antoine Poche

## 2016-05-25 ENCOUNTER — Encounter: Payer: Self-pay | Admitting: Internal Medicine

## 2016-05-25 ENCOUNTER — Other Ambulatory Visit: Payer: Self-pay | Admitting: Internal Medicine

## 2016-05-25 ENCOUNTER — Ambulatory Visit (INDEPENDENT_AMBULATORY_CARE_PROVIDER_SITE_OTHER): Payer: Medicare Other | Admitting: Internal Medicine

## 2016-05-25 VITALS — BP 124/84 | HR 56 | Ht 64.5 in | Wt 249.0 lb

## 2016-05-25 DIAGNOSIS — E1165 Type 2 diabetes mellitus with hyperglycemia: Secondary | ICD-10-CM

## 2016-05-25 DIAGNOSIS — E11319 Type 2 diabetes mellitus with unspecified diabetic retinopathy without macular edema: Secondary | ICD-10-CM

## 2016-05-25 LAB — POCT GLYCOSYLATED HEMOGLOBIN (HGB A1C): Hemoglobin A1C: 8.2

## 2016-05-25 NOTE — Progress Notes (Signed)
Patient ID: Joen LauraSusan Frazier-Brown, female   DOB: 07/29/1951, 65 y.o.   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  Last visit 3 mo ago. She has [employer] + [employer].   She lost 10 lbs this mo.   She has an infected wisdom tooth >> medication >> fatigue >> stopped going to the gym >> sugars higher. She will start yoga and swimming.   She also had a lot of family pbs.  Last hemoglobin A1c was:  Lab Results  Component Value Date   HGBA1C 7.9 02/26/2016   HGBA1C 8.1 11/27/2015   HGBA1C 7.5 08/26/2015  Prev. 5.9% on 12/2012, previously 6.1%.  Pt is on a regimen of: - Metformin ER 1000 mg 2x a day - may take only the am dose - Tradjenta 5 mg daily - Amaryl 4 mg 2x a day - take 6 mg (1.5 tablets) before a larger dinner >> forgot the second dose of the day!  We tried Invokana 100 mg in am >> stopped after 3 days >> nausea, diarrhea We stopped Lantus 10 units qhs. We stopped Actos 45 mg (was on it since 07/2001) Metformin 1000 mg bid >> stopped b/c Nausea/Diarrhea Metformin 1000 mg po bid >> did not take in the past, as she had mild CKD. She was on Novolog 8 units tid ac, when sugars >140   Pt checks her sugars 1-2x day: - am: 157-187 >> 83, 119-155, higher before >> 64, 73-116 >> 109-153, 181 >> 80-127 >> 78-171 >> 89-119 - 2h after b'fast:153 >> 197-251, 355 >> n/c >> 77, 104, 113, 180 >> 173 >> 144 >> 115-174, 249 >> n/c - lunch:100-180 (238) >> 125 >> n/c >> 242 >> n/c >> 147 >> 176 >> 74-152 >> 169 >> 133, 142 >> n/c >> 160-170 - 2h after lunch: 268, 286 >> n/c >> 83, 183 >> n/c  >> 126 n/c - dinner: 177 >> 65, 85 >> 88-150 >> 84, 147 >> 165, 222 (ate late) >> 85, 137-169 >> n/c - 2h after dinner: 152-186, before starting walking: 203, 254 >> 158, 176 >> 155, 231 >> 198-255 >> 108-160 >> 89x1, 180-200s - bedtime: 130-235 >> 139-220, 247, 258 >>  364 >> 62, 106, 146 >> 85, 101-167 >> n/c >> 105 >> 98-197 >> n/c - night: 115-164 >> 227 >> 97, 124, 144 >> 129, 188-254 >> 157, 171 >> 68x1 >> ?  Lowest CBG: 64 >> 63; she has hypoglycemia awareness at 70.  Highest sugar was 240.  Glucometer: Verio   Pt's meals are: 80 - Breakfast: was skipping, but some smoothies - Lunch: meat + veggies - mostly skips - Dinner: meat + veggies - Snacks: 1-2, fruit, yoghurt  She lost 80 lbs since 2012 >> could come off insulin.  She saw Oran ReinLaura Jobe (nutritionist) on 01/01/2015.  - has CKD stage 2. Last BUN/creatinine was:  Lab Results  Component Value Date   BUN 24 (H) 08/26/2015   Lab Results  Component Value Date   CREATININE 0.86 08/26/2015  She is on quinapril. Latest ACR was 9.4 on 12/2012.  - last lipid profile: Lab Results  Component Value Date   CHOL 170 08/26/2015   HDL 47.90 08/26/2015   LDLCALC 103 (H) 08/26/2015   TRIG 93.0 08/26/2015   CHOLHDL 4 08/26/2015  She is on Pravastatin. - she hasBackground diabetic retinopathy; had R cataract  sx in 2014. She had L cataract removal + implant in 01/2015. Last eye exam: Abstract on 10/09/2015  Component Date Value Ref Range Status  . HM Diabetic Eye Exam 09/29/2015 Retinopathy* No Retinopathy Final  - no numbness and tingling in her legs. Foot exam normal - 08/2015.  She also has a history of hypertension, hyperlipidemia-on Lipitor, iron deficiency anemia-on iron therapy, aortic valve disease/murmur, left ventricular hypertrophy per EKG, obesity, multinodular goiter, fibroids.  She had shingles in 11/2014 and 02/2016.  I reviewed pt's medications, allergies, PMH, social hx, family hx, and changes were documented in the history of present illness. Otherwise, unchanged from my initial visit note.  ROS: Constitutional: no weight loss/gain, no fatigue, no subjective hyperthermia/hypothermia Eyes: no blurry vision, no xerophthalmia ENT: no sore throat, no nodules palpated in throat, no  dysphagia/odynophagia Cardiovascular: no CP/SOB/palpitations/leg swelling Respiratory: no cough/no SOB/wheezing Gastrointestinal: no N/V/D/C Musculoskeletal: no muscle/joint aches Skin: no rashes Neurological: no tremors/numbness/tingling/dizziness  PE: BP 124/84 (BP Location: Right Arm, Patient Position: Sitting)   Pulse (!) 56   Ht 5' 4.5" (1.638 m)   Wt 249 lb (112.9 kg)   SpO2 97%   BMI 42.08 kg/m  Body mass index is 42.08 kg/m.  Wt Readings from Last 3 Encounters:  05/25/16 249 lb (112.9 kg)  05/16/16 259 lb 6 oz (117.7 kg)  02/26/16 251 lb (113.9 kg)   Constitutional: obese, in NAD Eyes: PERRLA, EOMI, no exophthalmos ENT: moist mucous membranes, no thyromegaly, no cervical lymphadenopathy Cardiovascular: RRR, No MRG, no edema lower legs  Respiratory: CTA B Gastrointestinal: abdomen soft, NT, ND, BS+ Musculoskeletal: no deformities, left forearm in sling Skin: moist, warm, stasis dermatitis on legs  ASSESSMENT: 1. DM2, non-insulin-dependent, uncontrolled, with complications - CKD stage 2 - background DR  PLAN:  1.  Patient with long-standing diabetes, with suboptimal control. She is missing med doses 2/2 being busy. She has pbs tolerating Metformin if she takes 2 tabs at the same time >> at last visit, I advised her to split this is several doses throughout the day. I also advised her to restart exercise and continue the watch her diet. She lost weight since last visit, but mostly 2/2 not being able to eat well b/c infected tooth >> will have an extraction in next few days. - she would want to try to take all meds as advised and restart exercise and will come for reevaluation in 1.5 mo. - I advised her to:  Patient Instructions  Please continue: - Metformin ER 1000 mg 2x a day - Tradjenta 5 mg daily - Amaryl 4 mg 2x a day   Please let me know if the sugars are consistently <90 or >200.   Please return in 1.5 months with your sugar log.   - continue to check  sugars 2x a day, rotating checks times - UTD with eye exams  - given a new VerioFlex meter today - check HbA1c today >> 8.2% (worse!) - I will see her back in 1.5 months with her sugar log   Carlus Pavlov, MD PhD [facility]

## 2016-05-25 NOTE — Patient Instructions (Addendum)
Please continue: - Metformin ER 1000 mg 2x a day - Tradjenta 5 mg daily - Amaryl 4 mg 2x a day  Please let me know if the sugars are consistently <90 or >200.   Please return in 1.5 months with your sugar log.

## 2016-05-25 NOTE — Telephone Encounter (Signed)
Patient notified MD has OK'ed her wisdom teeth removal.

## 2016-05-25 NOTE — Addendum Note (Signed)
Addended by: Darene Lamer T on: 05/25/2016 09:28 AM   Modules accepted: Orders

## 2016-05-30 ENCOUNTER — Other Ambulatory Visit: Payer: Self-pay | Admitting: Internal Medicine

## 2016-06-01 ENCOUNTER — Telehealth: Payer: Self-pay | Admitting: Internal Medicine

## 2016-06-01 MED ORDER — ONETOUCH DELICA LANCETS 33G MISC: 1 refills | Status: DC

## 2016-06-01 MED ORDER — ALCOHOL SWABS PADS: MEDICATED_PAD | 1 refills | Status: DC

## 2016-06-01 NOTE — Telephone Encounter (Signed)
Spoke with pt and verified that she is requesting verio flex delica lancets and alcohol swabs. Rxs sent.

## 2016-06-01 NOTE — Telephone Encounter (Signed)
Patient stated she need the supplies for her Vgo pump  [facility] - [place], [place] - [address] [phone] (Phone) [phone] (Fax)

## 2016-06-02 ENCOUNTER — Ambulatory Visit (INDEPENDENT_AMBULATORY_CARE_PROVIDER_SITE_OTHER): Payer: Medicare Other | Admitting: Family Medicine

## 2016-06-02 ENCOUNTER — Encounter: Payer: Self-pay | Admitting: Family Medicine

## 2016-06-02 VITALS — BP 166/78 | Temp 98.8°F | Ht 64.5 in | Wt 245.0 lb

## 2016-06-02 DIAGNOSIS — Z0000 Encounter for general adult medical examination without abnormal findings: Secondary | ICD-10-CM

## 2016-06-02 DIAGNOSIS — Z23 Encounter for immunization: Secondary | ICD-10-CM | POA: Diagnosis not present

## 2016-06-02 LAB — CBC WITH DIFFERENTIAL/PLATELET
Basophils Absolute: 0 10*3/uL (ref 0.0–0.1)
Basophils Relative: 0.4 % (ref 0.0–3.0)
Eosinophils Absolute: 0 10*3/uL (ref 0.0–0.7)
Eosinophils Relative: 0.3 % (ref 0.0–5.0)
HCT: 38.1 % (ref 36.0–46.0)
Hemoglobin: 12.6 g/dL (ref 12.0–15.0)
Lymphocytes Relative: 28.4 % (ref 12.0–46.0)
Lymphs Abs: 3.1 10*3/uL (ref 0.7–4.0)
MCHC: 33 g/dL (ref 30.0–36.0)
MCV: 78.2 fl (ref 78.0–100.0)
Monocytes Absolute: 0.5 10*3/uL (ref 0.1–1.0)
Monocytes Relative: 4.9 % (ref 3.0–12.0)
Neutro Abs: 7.3 10*3/uL (ref 1.4–7.7)
Neutrophils Relative %: 66 % (ref 43.0–77.0)
Platelets: 219 10*3/uL (ref 150.0–400.0)
RBC: 4.87 Mil/uL (ref 3.87–5.11)
RDW: 14.8 % (ref 11.5–15.5)
WBC: 11 10*3/uL — ABNORMAL HIGH (ref 4.0–10.5)

## 2016-06-02 LAB — POC URINALSYSI DIPSTICK (AUTOMATED)
Bilirubin, UA: NEGATIVE
Blood, UA: NEGATIVE
Glucose, UA: NEGATIVE
Ketones, UA: NEGATIVE
Leukocytes, UA: NEGATIVE
Nitrite, UA: NEGATIVE
Spec Grav, UA: 1.02
Urobilinogen, UA: 0.2
pH, UA: 5

## 2016-06-02 LAB — HEPATIC FUNCTION PANEL
ALT: 22 U/L (ref 0–35)
AST: 26 U/L (ref 0–37)
Albumin: 4.4 g/dL (ref 3.5–5.2)
Alkaline Phosphatase: 75 U/L (ref 39–117)
Bilirubin, Direct: 0.2 mg/dL (ref 0.0–0.3)
Total Bilirubin: 0.4 mg/dL (ref 0.2–1.2)
Total Protein: 8.5 g/dL — ABNORMAL HIGH (ref 6.0–8.3)

## 2016-06-02 LAB — BASIC METABOLIC PANEL
BUN: 26 mg/dL — ABNORMAL HIGH (ref 6–23)
CO2: 30 mEq/L (ref 19–32)
Calcium: 9.5 mg/dL (ref 8.4–10.5)
Chloride: 101 mEq/L (ref 96–112)
Creatinine, Ser: 1.02 mg/dL (ref 0.40–1.20)
GFR: 69.7 mL/min (ref >60.00)
Glucose, Bld: 91 mg/dL (ref 70–99)
Potassium: 3.7 mEq/L (ref 3.5–5.1)
Sodium: 143 mEq/L (ref 135–145)

## 2016-06-02 LAB — LIPID PANEL
Cholesterol: 158 mg/dL (ref 0–200)
HDL: 45 mg/dL (ref >39.00)
LDL Cholesterol: 88 mg/dL (ref 0–99)
NonHDL: 112.83
Total CHOL/HDL Ratio: 4
Triglycerides: 122 mg/dL (ref 0.0–149.0)
VLDL: 24.4 mg/dL (ref 0.0–40.0)

## 2016-06-02 LAB — TSH: TSH: 0.55 u[IU]/mL (ref 0.35–4.50)

## 2016-06-02 NOTE — Progress Notes (Signed)
   Subjective:    Patient ID: Krista Benjamin, female    DOB: November 14, 1950, 65 y.o.   MRN: [id number]  HPI 65 yr old female for a well exam. She has been doing well although she is recovering from having a wisdom tooth resection a few days ago. She saw Dr. Elvera Lennox on 05-25-16 and her A1c was a bit high at 8.2. Her BP at home has been stable.    Review of Systems  Constitutional: Negative.   HENT: Negative.   Eyes: Negative.   Respiratory: Negative.   Cardiovascular: Negative.   Gastrointestinal: Negative.   Genitourinary: Negative for decreased urine volume, difficulty urinating, dyspareunia, dysuria, enuresis, flank pain, frequency, hematuria, pelvic pain and urgency.  Musculoskeletal: Negative.   Skin: Negative.   Neurological: Negative.   Psychiatric/Behavioral: Negative.        Objective:   Physical Exam  Constitutional: She is oriented to person, place, and time. No distress.  obese  HENT:  Head: Normocephalic and atraumatic.  Right Ear: External ear normal.  Left Ear: External ear normal.  Nose: Nose normal.  Mouth/Throat: Oropharynx is clear and moist. No oropharyngeal exudate.  Eyes: Conjunctivae and EOM are normal. Pupils are equal, round, and reactive to light. No scleral icterus.  Neck: Normal range of motion. Neck supple. No JVD present. No thyromegaly present.  Cardiovascular: Normal rate, regular rhythm, normal heart sounds and intact distal pulses.  Exam reveals no gallop and no friction rub.   No murmur heard. Pulmonary/Chest: Effort normal and breath sounds normal. No respiratory distress. She has no wheezes. She has no rales. She exhibits no tenderness.  Abdominal: Soft. Bowel sounds are normal. She exhibits no distension and no mass. There is no tenderness. There is no rebound and no guarding.  Musculoskeletal: Normal range of motion. She exhibits no edema or tenderness.  Lymphadenopathy:    She has no cervical adenopathy.  Neurological: She is alert and  oriented to person, place, and time. She has normal reflexes. No cranial nerve deficit. She exhibits normal muscle tone. Coordination normal.  Skin: Skin is warm and dry. No rash noted. No erythema.  Psychiatric: She has a normal mood and affect. Her behavior is normal. Judgment and thought content normal.          Assessment & Plan:  Well exam. We discussed diet and exercise. Get fasting labs today. Nelwyn Salisbury, MD

## 2016-06-02 NOTE — Progress Notes (Signed)
Pre visit review using our clinic review tool, if applicable. No additional management support is needed unless otherwise documented below in the visit note. 

## 2016-07-19 ENCOUNTER — Other Ambulatory Visit: Payer: Self-pay | Admitting: Internal Medicine

## 2016-07-20 ENCOUNTER — Ambulatory Visit (INDEPENDENT_AMBULATORY_CARE_PROVIDER_SITE_OTHER): Payer: Medicare Other | Admitting: Internal Medicine

## 2016-07-20 ENCOUNTER — Encounter: Payer: Self-pay | Admitting: Internal Medicine

## 2016-07-20 VITALS — BP 134/85 | HR 66 | Temp 98.2°F | Resp 16 | Ht 64.5 in | Wt 252.4 lb

## 2016-07-20 DIAGNOSIS — E1165 Type 2 diabetes mellitus with hyperglycemia: Secondary | ICD-10-CM

## 2016-07-20 DIAGNOSIS — E11319 Type 2 diabetes mellitus with unspecified diabetic retinopathy without macular edema: Secondary | ICD-10-CM | POA: Diagnosis not present

## 2016-07-20 NOTE — Progress Notes (Signed)
Patient ID: Krista Benjamin, female   DOB: [birth year], 65 y.o.   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  Last visit 2 mo ago. She has [employer] + [employer].   Last hemoglobin A1c was:  Lab Results  Component Value Date   HGBA1C 8.2 05/25/2016   HGBA1C 7.9 02/26/2016   HGBA1C 8.1 11/27/2015  Prev. 5.9% on 12/2012, previously 6.1%.  Pt is on a regimen of: - Metformin ER 1000 mg 2x a day - still may take only the am dose - Tradjenta 5 mg daily - Amaryl 4 mg 2x a day - usually takes 6 mg (1.5 tablets) before a larger dinner >> forgot the second dose of the day!  We tried Invokana 100 mg in am >> stopped after 3 days >> nausea, diarrhea We stopped Lantus 10 units qhs. We stopped Actos 45 mg (was on it since 07/2001) Metformin 1000 mg bid >> stopped b/c Nausea/Diarrhea Metformin 1000 mg po bid >> did not take in the past, as she had mild CKD. She was on Novolog 8 units tid ac, when sugars >140   Pt checks her sugars 1-2x day: - am: 64, 73-116 >> 109-153, 181 >> 80-127 >> 78-171 >> 89-119 >> 61, 80-167, 217 (high when forgets meds at night) - 2h after b'fast: n/c >> 77, 104, 113, 180 >> 173 >> 144 >> 115-174, 249 >> n/c - lunch:242 >> n/c >> 147 >> 176 >> 74-152 >> 169 >> 133, 142 >> n/c >> 160-170 >> 116 - 2h after lunch: 268, 286 >> n/c >> 83, 183 >> n/c  >> 126 >> n/c - dinner: 177 >> 65, 85 >> 88-150 >> 84, 147 >> 165, 222 (ate late) >> 85, 137-169 >> n/c - 2h after dinner: 158, 176 >> 155, 231 >> 198-255 >> 108-160 >> 89x1, 180-200s >> 154-181, 205 - bedtime:364 >> 62, 106, 146 >> 85, 101-167 >> n/c >> 105 >> 98-197 >> n/c >> 133-215, 270  - night: 115-164 >> 227 >> 97, 124, 144 >> 129, 188-254 >> 157, 171 >> 68x1 >> ?  Lowest CBG: 64 >> 63 >> 61; she has hypoglycemia awareness at 70.  Highest sugar was 240 >> 270.  Glucometer:  VerioFlex  Pt's meals are:  - Breakfast: was skipping, but some smoothies - Lunch: meat + veggies - mostly skips - Dinner: meat + veggies - Snacks: 1-2, fruit, yoghurt  She lost 80 lbs since 2012 >> could come off insulin.  She saw Krista Benjamin (nutritionist) on 01/01/2015.  - has CKD stage 2. Last BUN/creatinine was:  Lab Results  Component Value Date   BUN 26 (H) 06/02/2016   Lab Results  Component Value Date   CREATININE 1.02 06/02/2016  She is on quinapril. Latest ACR was 9.4 on 12/2012.  - last lipid profile: Lab Results  Component Value Date   CHOL 158 06/02/2016   HDL 45.00 06/02/2016   LDLCALC 88 06/02/2016   TRIG 122.0 06/02/2016   CHOLHDL 4 06/02/2016  She is on Pravastatin. - she has Background diabetic retinopathy; had R cataract sx in 2014. She had L cataract removal + implant in 01/2015. Last eye exam: Component Date Value  . HM Diabetic Eye Exam 03/31/2016 Retinopathy (?)  - no numbness and tingling in her legs. Foot exam normal - 08/2015.  She also has a history of hypertension, hyperlipidemia-on Lipitor,  iron deficiency anemia-on iron therapy, aortic valve disease/murmur, left ventricular hypertrophy per EKG, obesity, multinodular goiter, fibroids.  She had shingles in 11/2014 and 02/2016.  I reviewed pt's medications, allergies, PMH, social hx, family hx, and changes were documented in the history of present illness. Otherwise, unchanged from my initial visit note.  ROS: Constitutional: no weight loss/gain, no fatigue, no subjective hyperthermia/hypothermia Eyes: no blurry vision, no xerophthalmia ENT: no sore throat, no nodules palpated in throat, no dysphagia/odynophagia Cardiovascular: no CP/SOB/palpitations/leg swelling Respiratory: no cough/no SOB/wheezing Gastrointestinal: no N/V/D/C Musculoskeletal: no muscle/joint aches Skin: no rashes Neurological: no tremors/numbness/tingling/dizziness  PE: BP 134/85   Pulse 66   Temp 98.2 F (36.8 C)    Resp 16   Ht 5' 4.5" (1.638 m)   Wt 252 lb 6.4 oz (114.5 kg)   SpO2 95%   BMI 42.66 kg/m  Body mass index is 42.66 kg/m.  Wt Readings from Last 3 Encounters:  07/20/16 252 lb 6.4 oz (114.5 kg)  06/02/16 245 lb (111.1 kg)  05/25/16 249 lb (112.9 kg)   Constitutional: obese, in NAD Eyes: PERRLA, EOMI, no exophthalmos ENT: moist mucous membranes, no thyromegaly, no cervical lymphadenopathy Cardiovascular: RRR, No MRG, no edema lower legs  Respiratory: CTA B Gastrointestinal: abdomen soft, NT, ND, BS+ Musculoskeletal: no deformities, left forearm in sling Skin: moist, warm, stasis dermatitis on legs  ASSESSMENT: 1. DM2, non-insulin-dependent, uncontrolled, with complications - CKD stage 2 - background DR  PLAN:  1.  Patient with long-standing diabetes, with suboptimal control. She is still missing evening med doses - forgets them. She is starting to use a pill box. I also suggested alarms on phone. - sugars in am can be low if takes 6 mg Amaryl at night >> only take 4 mg. - again advised to restart exercise - last HbA1c was 8.2% (higher) - I advised her to:  Patient Instructions  Please continue: - Metformin ER 1000 mg 2x a day - Tradjenta 5 mg daily - Amaryl 4 mg 2x a day. Please do not increase the dose to 6 mg before dinner.  Please let me know if the sugars are consistently <90 or >200.  Please return in 2 months with your sugar log.   - continue to check sugars 2x a day, rotating checks times - UTD with eye exams >> will have a new one with Dr. Ashley Benjamin - I will see her back in 2 months with her sugar log   Krista Pavlovristina Yohanna Tow, MD PhD [facility]

## 2016-07-20 NOTE — Patient Instructions (Addendum)
Please continue: - Metformin ER 1000 mg 2x a day - Tradjenta 5 mg daily - Amaryl 4 mg 2x a day. Please do not increase the dose to 6 mg before dinner.  Please let me know if the sugars are consistently <90 or >200.  Please return in 2 months with your sugar log.

## 2016-07-29 ENCOUNTER — Other Ambulatory Visit: Payer: Self-pay | Admitting: Family Medicine

## 2016-07-29 ENCOUNTER — Ambulatory Visit (INDEPENDENT_AMBULATORY_CARE_PROVIDER_SITE_OTHER): Payer: Medicare Other | Admitting: Family Medicine

## 2016-07-29 DIAGNOSIS — Z23 Encounter for immunization: Secondary | ICD-10-CM | POA: Diagnosis not present

## 2016-07-29 DIAGNOSIS — Z209 Contact with and (suspected) exposure to unspecified communicable disease: Secondary | ICD-10-CM

## 2016-07-29 NOTE — Addendum Note (Signed)
Addended by: Aniceto Boss A on: 07/29/2016 12:14 PM   Modules accepted: Orders

## 2016-07-30 LAB — HEPATITIS C ANTIBODY: HCV Ab: NEGATIVE

## 2016-08-01 ENCOUNTER — Other Ambulatory Visit: Payer: Self-pay | Admitting: Internal Medicine

## 2016-08-19 ENCOUNTER — Ambulatory Visit (INDEPENDENT_AMBULATORY_CARE_PROVIDER_SITE_OTHER): Payer: Medicare Other

## 2016-08-19 VITALS — BP 150/80 | HR 62 | Ht 64.0 in | Wt 254.0 lb

## 2016-08-19 DIAGNOSIS — Z0000 Encounter for general adult medical examination without abnormal findings: Secondary | ICD-10-CM
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
## 2016-08-19 NOTE — Patient Instructions (Addendum)
[name] , Thank you for taking time to come for your Medicare Wellness Visit. I appreciate your ongoing commitment to your health goals. Please review the following plan we discussed and let me know if I can assist you in the future.   Will exercise !!!  These are the goals we discussed: Goals    . Exercise 150 minutes per week (moderate activity)          [facility] without the [facility];        This is a list of the screening recommended for you and due dates:  Health Maintenance  Topic Date Due  . Complete foot exam   08/25/2016  . Hemoglobin A1C  11/25/2016  . Eye exam for diabetics  03/31/2017  . Pneumonia vaccines (2 of 2 - PPSV23) 07/29/2017  . Mammogram  09/28/2017  . Colon Cancer Screening  11/25/2017  . Tetanus Vaccine  10/01/2024  . Flu Shot  Completed  . DEXA scan (bone density measurement)  Completed  . Shingles Vaccine  Completed  .  Hepatitis C: One time screening is recommended by Center for Disease Control  (CDC) for  adults born from [birth year] through 1966.   Completed     Fall Prevention in the Home  Falls can cause injuries. They can happen to people of all ages. There are many things you can do to make your home safe and to help prevent falls.  WHAT CAN I DO ON THE OUTSIDE OF MY HOME?  Regularly fix the edges of walkways and driveways and fix any cracks.  Remove anything that might make you trip as you walk through a door, such as a raised step or threshold.  Trim any bushes or trees on the path to your home.  Use bright outdoor lighting.  Clear any walking paths of anything that might make someone trip, such as rocks or tools.  Regularly check to see if handrails are loose or broken. Make sure that both sides of any steps have handrails.  Any raised decks and porches should have guardrails on the edges.  Have any leaves, snow, or ice cleared regularly.  Use sand or salt on walking paths during  winter.  Clean up any spills in your garage right away. This includes oil or grease spills. WHAT CAN I DO IN THE BATHROOM?   Use night lights.  Install grab bars by the toilet and in the tub and shower. Do not use towel bars as grab bars.  Use non-skid mats or decals in the tub or shower.  If you need to sit down in the shower, use a plastic, non-slip stool.  Keep the floor dry. Clean up any water that spills on the floor as soon as it happens.  Remove soap buildup in the tub or shower regularly.  Attach bath mats securely with double-sided non-slip rug tape.  Do not have throw rugs and other things on the floor that can make you trip. WHAT CAN I DO IN THE BEDROOM?  Use night lights.  Make sure that you have a light by your bed that is easy to reach.  Do not use any sheets or blankets that are too big for your bed. They should not hang down onto the floor.  Have a firm chair that has side arms. You can use this for support while you get dressed.  Do not have throw rugs and other things on the floor that  can make you trip. WHAT CAN I DO IN THE KITCHEN?  Clean up any spills right away.  Avoid walking on wet floors.  Keep items that you use a lot in easy-to-reach places.  If you need to reach something above you, use a strong step stool that has a grab bar.  Keep electrical cords out of the way.  Do not use floor polish or wax that makes floors slippery. If you must use wax, use non-skid floor wax.  Do not have throw rugs and other things on the floor that can make you trip. WHAT CAN I DO WITH MY STAIRS?  Do not leave any items on the stairs.  Make sure that there are handrails on both sides of the stairs and use them. Fix handrails that are broken or loose. Make sure that handrails are as long as the stairways.  Check any carpeting to make sure that it is firmly attached to the stairs. Fix any carpet that is loose or worn.  Avoid having throw rugs at the top or  bottom of the stairs. If you do have throw rugs, attach them to the floor with carpet tape.  Make sure that you have a light switch at the top of the stairs and the bottom of the stairs. If you do not have them, ask someone to add them for you. WHAT ELSE CAN I DO TO HELP PREVENT FALLS?  Wear shoes that:  Do not have high heels.  Have rubber bottoms.  Are comfortable and fit you well.  Are closed at the toe. Do not wear sandals.  If you use a stepladder:  Make sure that it is fully opened. Do not climb a closed stepladder.  Make sure that both sides of the stepladder are locked into place.  Ask someone to hold it for you, if possible.  Clearly mark and make sure that you can see:  Any grab bars or handrails.  First and last steps.  Where the edge of each step is.  Use tools that help you move around (mobility aids) if they are needed. These include:  Canes.  Walkers.  Scooters.  Crutches.  Turn on the lights when you go into a dark area. Replace any light bulbs as soon as they burn out.  Set up your furniture so you have a clear path. Avoid moving your furniture around.  If any of your floors are uneven, fix them.  If there are any pets around you, be aware of where they are.  Review your medicines with your doctor. Some medicines can make you feel dizzy. This can increase your chance of falling. Ask your doctor what other things that you can do to help prevent falls.   This information is not intended to replace advice given to you by your health care provider. Make sure you discuss any questions you have with your health care provider.   Document Released: 07/23/2009 Document Revised: 02/10/2015 Document Reviewed: 10/31/2014 Elsevier Interactive Patient Education 2016 [place] Maintenance, Female Adopting a healthy lifestyle and getting preventive care can go a long way to promote health and wellness. Talk with your health care provider about  what schedule of regular examinations is right for you. This is a good chance for you to check in with your provider about disease prevention and staying healthy. In between checkups, there are plenty of things you can do on your own. Experts have done a lot of research about which lifestyle changes and  preventive measures are most likely to keep you healthy. Ask your health care provider for more information. WEIGHT AND DIET  Eat a healthy diet  Be sure to include plenty of vegetables, fruits, low-fat dairy products, and lean protein.  Do not eat a lot of foods high in solid fats, added sugars, or salt.  Get regular exercise. This is one of the most important things you can do for your health.  Most adults should exercise for at least 150 minutes each week. The exercise should increase your heart rate and make you sweat (moderate-intensity exercise).  Most adults should also do strengthening exercises at least twice a week. This is in addition to the moderate-intensity exercise.  Maintain a healthy weight  Body mass index (BMI) is a measurement that can be used to identify possible weight problems. It estimates body fat based on height and weight. Your health care provider can help determine your BMI and help you achieve or maintain a healthy weight.  For females 75 years of age and older:   A BMI below 18.5 is considered underweight.  A BMI of 18.5 to 24.9 is normal.  A BMI of 25 to 29.9 is considered overweight.  A BMI of 30 and above is considered obese.  Watch levels of cholesterol and blood lipids  You should start having your blood tested for lipids and cholesterol at 66 years of age, then have this test every 5 years.  You may need to have your cholesterol levels checked more often if:  Your lipid or cholesterol levels are high.  You are older than 66 years of age.  You are at high risk for heart disease.  CANCER SCREENING   Lung Cancer  Lung cancer screening is  recommended for adults 27-18 years old who are at high risk for lung cancer because of a history of smoking.  A yearly low-dose CT scan of the lungs is recommended for people who:  Currently smoke.  Have quit within the past 15 years.  Have at least a 30-pack-year history of smoking. A pack year is smoking an average of one pack of cigarettes a day for 1 year.  Yearly screening should continue until it has been 15 years since you quit.  Yearly screening should stop if you develop a health problem that would prevent you from having lung cancer treatment.  Breast Cancer  Practice breast self-awareness. This means understanding how your breasts normally appear and feel.  It also means doing regular breast self-exams. Let your health care provider know about any changes, no matter how small.  If you are in your 20s or 30s, you should have a clinical breast exam (CBE) by a health care provider every 1-3 years as part of a regular health exam.  If you are 104 or older, have a CBE every year. Also consider having a breast X-ray (mammogram) every year.  If you have a family history of breast cancer, talk to your health care provider about genetic screening.  If you are at high risk for breast cancer, talk to your health care provider about having an MRI and a mammogram every year.  Breast cancer gene (BRCA) assessment is recommended for women who have family members with BRCA-related cancers. BRCA-related cancers include:  Breast.  Ovarian.  Tubal.  Peritoneal cancers.  Results of the assessment will determine the need for genetic counseling and BRCA1 and BRCA2 testing. Cervical Cancer Your health care provider may recommend that you be screened regularly for  cancer of the pelvic organs (ovaries, uterus, and vagina). This screening involves a pelvic examination, including checking for microscopic changes to the surface of your cervix (Pap test). You may be encouraged to have this  screening done every 3 years, beginning at age 31.  For women ages 51-65, health care providers may recommend pelvic exams and Pap testing every 3 years, or they may recommend the Pap and pelvic exam, combined with testing for human papilloma virus (HPV), every 5 years. Some types of HPV increase your risk of cervical cancer. Testing for HPV may also be done on women of any age with unclear Pap test results.  Other health care providers may not recommend any screening for nonpregnant women who are considered low risk for pelvic cancer and who do not have symptoms. Ask your health care provider if a screening pelvic exam is right for you.  If you have had past treatment for cervical cancer or a condition that could lead to cancer, you need Pap tests and screening for cancer for at least 20 years after your treatment. If Pap tests have been discontinued, your risk factors (such as having a new sexual partner) need to be reassessed to determine if screening should resume. Some women have medical problems that increase the chance of getting cervical cancer. In these cases, your health care provider may recommend more frequent screening and Pap tests. Colorectal Cancer  This type of cancer can be detected and often prevented.  Routine colorectal cancer screening usually begins at 67 years of age and continues through 66 years of age.  Your health care provider may recommend screening at an earlier age if you have risk factors for colon cancer.  Your health care provider may also recommend using home test kits to check for hidden blood in the stool.  A small camera at the end of a tube can be used to examine your colon directly (sigmoidoscopy or colonoscopy). This is done to check for the earliest forms of colorectal cancer.  Routine screening usually begins at age 55.  Direct examination of the colon should be repeated every 5-10 years through 66 years of age. However, you may need to be screened  more often if early forms of precancerous polyps or small growths are found. Skin Cancer  Check your skin from head to toe regularly.  Tell your health care provider about any new moles or changes in moles, especially if there is a change in a mole's shape or color.  Also tell your health care provider if you have a mole that is larger than the size of a pencil eraser.  Always use sunscreen. Apply sunscreen liberally and repeatedly throughout the day.  Protect yourself by wearing long sleeves, pants, a wide-brimmed hat, and sunglasses whenever you are outside. HEART DISEASE, DIABETES, AND HIGH BLOOD PRESSURE   High blood pressure causes heart disease and increases the risk of stroke. High blood pressure is more likely to develop in:  People who have blood pressure in the high end of the normal range (130-139/85-89 mm Hg).  People who are overweight or obese.  People who are African American.  If you are 6-52 years of age, have your blood pressure checked every 3-5 years. If you are 29 years of age or older, have your blood pressure checked every year. You should have your blood pressure measured twice--once when you are at a hospital or clinic, and once when you are not at a hospital or clinic. Record the average  of the two measurements. To check your blood pressure when you are not at a hospital or clinic, you can use:  An automated blood pressure machine at a pharmacy.  A home blood pressure monitor.  If you are between 32 years and 3 years old, ask your health care provider if you should take aspirin to prevent strokes.  Have regular diabetes screenings. This involves taking a blood sample to check your fasting blood sugar level.  If you are at a normal weight and have a low risk for diabetes, have this test once every three years after 66 years of age.  If you are overweight and have a high risk for diabetes, consider being tested at a younger age or more often. PREVENTING  INFECTION  Hepatitis B  If you have a higher risk for hepatitis B, you should be screened for this virus. You are considered at high risk for hepatitis B if:  You were born in a country where hepatitis B is common. Ask your health care provider which countries are considered high risk.  Your parents were born in a high-risk country, and you have not been immunized against hepatitis B (hepatitis B vaccine).  You have HIV or AIDS.  You use needles to inject street drugs.  You live with someone who has hepatitis B.  You have had sex with someone who has hepatitis B.  You get hemodialysis treatment.  You take certain medicines for conditions, including cancer, organ transplantation, and autoimmune conditions. Hepatitis C  Blood testing is recommended for:  Everyone born from [birth year] through 1965.  Anyone with known risk factors for hepatitis C. Sexually transmitted infections (STIs)  You should be screened for sexually transmitted infections (STIs) including gonorrhea and chlamydia if:  You are sexually active and are younger than 66 years of age.  You are older than 66 years of age and your health care provider tells you that you are at risk for this type of infection.  Your sexual activity has changed since you were last screened and you are at an increased risk for chlamydia or gonorrhea. Ask your health care provider if you are at risk.  If you do not have HIV, but are at risk, it may be recommended that you take a prescription medicine daily to prevent HIV infection. This is called pre-exposure prophylaxis (PrEP). You are considered at risk if:  You are sexually active and do not regularly use condoms or know the HIV status of your partner(s).  You take drugs by injection.  You are sexually active with a partner who has HIV. Talk with your health care provider about whether you are at high risk of being infected with HIV. If you choose to begin PrEP, you should first be  tested for HIV. You should then be tested every 3 months for as long as you are taking PrEP.  PREGNANCY   If you are premenopausal and you may become pregnant, ask your health care provider about preconception counseling.  If you may become pregnant, take 400 to 800 micrograms (mcg) of folic acid every day.  If you want to prevent pregnancy, talk to your health care provider about birth control (contraception). OSTEOPOROSIS AND MENOPAUSE   Osteoporosis is a disease in which the bones lose minerals and strength with aging. This can result in serious bone fractures. Your risk for osteoporosis can be identified using a bone density scan.  If you are 21 years of age or older, or if you are  at risk for osteoporosis and fractures, ask your health care provider if you should be screened.  Ask your health care provider whether you should take a calcium or vitamin D supplement to lower your risk for osteoporosis.  Menopause may have certain physical symptoms and risks.  Hormone replacement therapy may reduce some of these symptoms and risks. Talk to your health care provider about whether hormone replacement therapy is right for you.  HOME CARE INSTRUCTIONS   Schedule regular health, dental, and eye exams.  Stay current with your immunizations.   Do not use any tobacco products including cigarettes, chewing tobacco, or electronic cigarettes.  If you are pregnant, do not drink alcohol.  If you are breastfeeding, limit how much and how often you drink alcohol.  Limit alcohol intake to no more than 1 drink per day for nonpregnant women. One drink equals 12 ounces of beer, 5 ounces of wine, or 1 ounces of hard liquor.  Do not use street drugs.  Do not share needles.  Ask your health care provider for help if you need support or information about quitting drugs.  Tell your health care provider if you often feel depressed.  Tell your health care provider if you have ever been abused or  do not feel safe at home.   This information is not intended to replace advice given to you by your health care provider. Make sure you discuss any questions you have with your health care provider.   Document Released: 04/11/2011 Document Revised: 10/17/2014 Document Reviewed: 08/28/2013 Elsevier Interactive Patient Education [employer].

## 2016-08-21 ENCOUNTER — Other Ambulatory Visit: Payer: Self-pay | Admitting: Family Medicine

## 2016-08-24 ENCOUNTER — Other Ambulatory Visit: Payer: Self-pay | Admitting: Internal Medicine

## 2016-09-22 ENCOUNTER — Other Ambulatory Visit: Payer: Self-pay | Admitting: Family Medicine

## 2016-09-28 ENCOUNTER — Ambulatory Visit (INDEPENDENT_AMBULATORY_CARE_PROVIDER_SITE_OTHER): Payer: Medicare Other | Admitting: Ophthalmology

## 2016-10-12 ENCOUNTER — Encounter: Payer: Self-pay | Admitting: Internal Medicine

## 2016-10-12 ENCOUNTER — Ambulatory Visit (INDEPENDENT_AMBULATORY_CARE_PROVIDER_SITE_OTHER): Payer: Medicare Other | Admitting: Internal Medicine

## 2016-10-12 VITALS — BP 128/78 | HR 57 | Ht 64.0 in | Wt 257.0 lb

## 2016-10-12 DIAGNOSIS — E11319 Type 2 diabetes mellitus with unspecified diabetic retinopathy without macular edema: Secondary | ICD-10-CM | POA: Diagnosis not present

## 2016-10-12 DIAGNOSIS — E1165 Type 2 diabetes mellitus with hyperglycemia: Secondary | ICD-10-CM

## 2016-10-12 LAB — POCT GLYCOSYLATED HEMOGLOBIN (HGB A1C): Hemoglobin A1C: 8.4

## 2016-10-12 MED ORDER — LINAGLIPTIN 5 MG PO TABS: 5.0000 mg | ORAL_TABLET | Freq: Every morning | ORAL | 3 refills | Status: DC

## 2016-10-12 MED ORDER — GLIMEPIRIDE 4 MG PO TABS: 4.0000 mg | ORAL_TABLET | Freq: Two times a day (BID) | ORAL | 3 refills | Status: DC

## 2016-10-12 MED ORDER — METFORMIN HCL ER 500 MG PO TB24: ORAL_TABLET | ORAL | 3 refills | Status: DC

## 2016-10-12 NOTE — Addendum Note (Signed)
Addended by: Darene Lamer T on: 10/12/2016 08:56 AM   Modules accepted: Orders

## 2016-10-12 NOTE — Patient Instructions (Addendum)
Please continue: - Tradjenta 5 mg daily - Amaryl 4 mg 2x a day  Try to take Metformin ER 500 mg with b'fast and 1000 mg with dinner.  Please return in 3 months with your sugar log.

## 2016-10-12 NOTE — Progress Notes (Signed)
Patient ID: Krista Benjamin, female   DOB: August 11, 1951, 66 y.o.   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  Last visit 3 mo ago. She has [employer] + [employer].   Last hemoglobin A1c was:  Lab Results  Component Value Date   HGBA1C 8.2 05/25/2016   HGBA1C 7.9 02/26/2016   HGBA1C 8.1 11/27/2015  Prev. 5.9% on 12/2012, previously 6.1%.  Pt is on a regimen of: - Metformin ER 1000 mg 2x a day - still may take only the am dose b/c abd. discomfort 2x a week - Tradjenta 5 mg daily - Amaryl 4 mg 2x a day  We tried Invokana 100 mg in am >> stopped after 3 days >> nausea, diarrhea We stopped Lantus 10 units qhs. We stopped Actos 45 mg (was on it since 07/2001) Metformin 1000 mg bid >> stopped b/c Nausea/Diarrhea Metformin 1000 mg po bid >> did not take in the past, as she had mild CKD. She was on Novolog 8 units tid ac, when sugars >140   Pt checks her sugars 1-2x day - no log/meter: - am:109-153, 181 >> 80-127 >> 78-171 >> 89-119 >> 61, 80-167, 217 (high when forgets meds at night) >> 97-126 - 2h after b'fast: n/c >> 77, 104, 113, 180 >> 173 >> 144 >> 115-174, 249 >> n/c - lunch:242 >> n/c >> 147 >> 176 >> 74-152 >> 169 >> 133, 142 >> n/c >> 160-170 >> 116 >> 140, 240 (Holiday, forgot meds) - 2h after lunch: 268, 286 >> n/c >> 83, 183 >> n/c  >> 126 >> n/c >> 230, 340 (no meds) - dinner: 177 >> 65, 85 >> 88-150 >> 84, 147 >> 165, 222 (ate late) >> 85, 137-169 >> n/c  - 2h after dinner: 158, 176 >> 155, 231 >> 198-255 >> 108-160 >> 89x1, 180-200s >> 154-181, 205 >> 161-206 - bedtime:364 >> 62, 106, 146 >> 85, 101-167 >> n/c >> 105 >> 98-197 >> n/c >> 133-215, 270 >> 174 - night: 115-164 >> 227 >> 97, 124, 144 >> 129, 188-254 >> 157, 171 >> 68x1 >> ?  >> n/c Lowest CBG: 64 >> 63 >> 61 >> 97; she has hypoglycemia awareness at 70.  Highest sugar was  240 >> 270 >> 240.  Glucometer: VerioFlex  Pt's meals are:  - Breakfast: was skipping, but some smoothies - Lunch: meat + veggies - mostly skips - Dinner: meat + veggies - Snacks: 1-2, fruit, yoghurt  She lost 80 lbs since 2012 >> could come off insulin.  She saw Oran Rein (nutritionist) on 01/01/2015.  - has CKD stage 2. Last BUN/creatinine was:  Lab Results  Component Value Date   BUN 26 (H) 06/02/2016   Lab Results  Component Value Date   CREATININE 1.02 06/02/2016  She is on quinapril. Latest ACR was 9.4 on 12/2012.  - last lipid profile: Lab Results  Component Value Date   CHOL 158 06/02/2016   HDL 45.00 06/02/2016   LDLCALC 88 06/02/2016   TRIG 122.0 06/02/2016   CHOLHDL 4 06/02/2016  She is on Pravastatin. - she has Background diabetic retinopathy; had R cataract sx in 2014. She had L cataract removal + implant in 01/2015. Last eye exam: Component Date Value  . HM Diabetic Eye Exam 03/31/2016 Retinopathy (?)  - no numbness and tingling in her legs. Foot exam normal - 08/2015.  She also has a history of hypertension, hyperlipidemia-on Lipitor, iron deficiency anemia-on iron therapy, aortic valve disease/murmur, left ventricular hypertrophy per EKG, obesity, multinodular goiter, fibroids.  She had shingles in 11/2014 and 02/2016.  I reviewed pt's medications, allergies, PMH, social hx, family hx, and changes were documented in the history of present illness. Otherwise, unchanged from my initial visit note.  ROS: Constitutional: no weight loss/gain, no fatigue, no subjective hyperthermia/hypothermia Eyes: no blurry vision, no xerophthalmia ENT: no sore throat, no nodules palpated in throat, no dysphagia/odynophagia Cardiovascular: no CP/SOB/palpitations/leg swelling Respiratory: no cough/no SOB/wheezing Gastrointestinal: no N/V/D/C Musculoskeletal: no muscle/joint aches Skin: no rashes Neurological: no tremors/numbness/tingling/dizziness  PE: BP 128/78 (BP  Location: Right Arm, Patient Position: Sitting)   Pulse (!) 57   Ht 5\' 4"  (1.626 m)   Wt 257 lb (116.6 kg)   SpO2 97%   BMI 44.11 kg/m  Body mass index is 44.11 kg/m.  Wt Readings from Last 3 Encounters:  10/12/16 257 lb (116.6 kg)  08/19/16 254 lb (115.2 kg)  07/20/16 252 lb 6.4 oz (114.5 kg)   Constitutional: obese, in NAD Eyes: PERRLA, EOMI, no exophthalmos ENT: moist mucous membranes, no thyromegaly, no cervical lymphadenopathy Cardiovascular: RRR, No MRG, no LE edema  Respiratory: CTA B Gastrointestinal: abdomen soft, NT, ND, BS+ Musculoskeletal: no deformities Skin: moist, warm, stasis dermatitis on legs  ASSESSMENT: 1. DM2, non-insulin-dependent, uncontrolled, with complications - CKD stage 2 - background DR  PLAN:  1.  Patient with long-standing diabetes, with suboptimal control. She is taking less Metformin in the pm as she may have AP and diarrhea after the am dose >> advised her to try to take only 500 in am and 1000 mg with dinner.  - again advised to restart exercise and also to start paying attn to her diet now after the Holidays. She has hyperglycemic spikes. - last HbA1c was 8.2% (higher) >> now 8.4% - I advised her to:  Patient Instructions  Please continue: - Tradjenta 5 mg daily - Amaryl 4 mg 2x a day  Try to take Metformin ER 500 mg with b'fast and 1000 mg with dinner.  Please return in 3 months with your sugar log.   - continue to check sugars 2x a day, rotating checks times - UTD with eye exams (Dr. Ashley Royalty) - UTD with flu and PNA vaccines - I will see her back in 3 months with her sugar log   Carlus Pavlov, MD PhD [facility] Endocrin[facility]
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
## 2016-10-20 ENCOUNTER — Encounter: Payer: Self-pay | Admitting: Family Medicine

## 2016-10-20 ENCOUNTER — Ambulatory Visit (INDEPENDENT_AMBULATORY_CARE_PROVIDER_SITE_OTHER): Payer: Medicare Other | Admitting: Ophthalmology

## 2016-10-20 DIAGNOSIS — E113293 Type 2 diabetes mellitus with mild nonproliferative diabetic retinopathy without macular edema, bilateral: Secondary | ICD-10-CM | POA: Diagnosis not present

## 2016-10-20 DIAGNOSIS — I10 Essential (primary) hypertension: Secondary | ICD-10-CM | POA: Diagnosis not present

## 2016-10-20 DIAGNOSIS — H43813 Vitreous degeneration, bilateral: Secondary | ICD-10-CM

## 2016-10-20 DIAGNOSIS — H35033 Hypertensive retinopathy, bilateral: Secondary | ICD-10-CM

## 2016-10-20 DIAGNOSIS — E11319 Type 2 diabetes mellitus with unspecified diabetic retinopathy without macular edema: Secondary | ICD-10-CM | POA: Diagnosis not present

## 2016-10-20 LAB — HM DIABETES EYE EXAM

## 2016-11-22 ENCOUNTER — Other Ambulatory Visit: Payer: Self-pay | Admitting: Internal Medicine

## 2016-12-07 ENCOUNTER — Other Ambulatory Visit: Payer: Self-pay | Admitting: Family Medicine

## 2016-12-08 NOTE — Telephone Encounter (Signed)
Can we refill this? 

## 2016-12-16 ENCOUNTER — Ambulatory Visit (INDEPENDENT_AMBULATORY_CARE_PROVIDER_SITE_OTHER): Payer: Medicare Other | Admitting: Family Medicine

## 2016-12-16 ENCOUNTER — Encounter: Payer: Self-pay | Admitting: Family Medicine

## 2016-12-16 VITALS — BP 160/90 | Temp 98.5°F | Ht 64.0 in | Wt 265.0 lb

## 2016-12-16 DIAGNOSIS — J019 Acute sinusitis, unspecified: Secondary | ICD-10-CM | POA: Diagnosis not present

## 2016-12-16 MED ORDER — ALBUTEROL SULFATE HFA 108 (90 BASE) MCG/ACT IN AERS: INHALATION_SPRAY | RESPIRATORY_TRACT | 3 refills | Status: DC

## 2016-12-16 MED ORDER — BUMETANIDE 1 MG PO TABS: 2.0000 mg | ORAL_TABLET | Freq: Every day | ORAL | 3 refills | Status: DC

## 2016-12-16 MED ORDER — AZITHROMYCIN 250 MG PO TABS: ORAL_TABLET | ORAL | 0 refills | Status: DC

## 2016-12-16 MED ORDER — HYDROCODONE-HOMATROPINE 5-1.5 MG/5ML PO SYRP: 5.0000 mL | ORAL_SOLUTION | ORAL | 0 refills | Status: DC | PRN

## 2016-12-16 NOTE — Progress Notes (Signed)
   Subjective:    Patient ID: Krista Benjamin, female    DOB: 06-Mar-1951, 66 y.o.   MRN: [id number]  HPI Here for 2 days of sinus pressure, PND, and coughing up yellow sputum. No fever.    Review of Systems  Constitutional: Negative.   HENT: Positive for congestion, postnasal drip, sinus pain and sinus pressure. Negative for sore throat.   Eyes: Negative.   Respiratory: Positive for cough.        Objective:   Physical Exam  Constitutional: She appears well-developed and well-nourished.  HENT:  Right Ear: External ear normal.  Left Ear: External ear normal.  Nose: Nose normal.  Mouth/Throat: Oropharynx is clear and moist.  Eyes: Conjunctivae are normal.  Neck: No thyromegaly present.  Pulmonary/Chest: Effort normal and breath sounds normal. No respiratory distress. She has no wheezes. She has no rales.  Lymphadenopathy:    She has no cervical adenopathy.          Assessment & Plan:  Sinusitis, treat with a Zpack.  Gershon Crane, MD

## 2016-12-16 NOTE — Progress Notes (Signed)
Pre visit review using our clinic review tool, if applicable. No additional management support is needed unless otherwise documented below in the visit note. 

## 2016-12-16 NOTE — Patient Instructions (Signed)
WE NOW OFFER   [facility] FAST TRACK!!!  SAME DAY Appointments for ACUTE CARE  Such as: Sprains, Injuries, cuts, abrasions, rashes, muscle pain, joint pain, back pain Colds, flu, sore throats, headache, allergies, cough, fever  Ear pain, sinus and eye infections Abdominal pain, nausea, vomiting, diarrhea, upset stomach Animal/insect bites  3 Easy Ways to Schedule: Walk-In Scheduling Call in scheduling Mychart Sign-up: [email or web address]         

## 2017-01-10 ENCOUNTER — Ambulatory Visit: Payer: Medicare Other | Admitting: Internal Medicine

## 2017-01-12 ENCOUNTER — Telehealth: Payer: Self-pay | Admitting: Family Medicine

## 2017-01-12 NOTE — Telephone Encounter (Signed)
I called the pt to see if I could help her and she stated she has already taken care of what the problem and will call back if needed.

## 2017-01-12 NOTE — Telephone Encounter (Signed)
Pt would like to have a call back on a personal matter. 

## 2017-01-25 ENCOUNTER — Encounter: Payer: Self-pay | Admitting: Family Medicine

## 2017-01-25 ENCOUNTER — Ambulatory Visit (INDEPENDENT_AMBULATORY_CARE_PROVIDER_SITE_OTHER): Payer: Medicare Other | Admitting: Family Medicine

## 2017-01-25 VITALS — BP 182/86 | Temp 98.6°F | Ht 64.0 in | Wt 271.0 lb

## 2017-01-25 DIAGNOSIS — I10 Essential (primary) hypertension: Secondary | ICD-10-CM | POA: Diagnosis not present

## 2017-01-25 MED ORDER — METOPROLOL SUCCINATE ER 100 MG PO TB24: 100.0000 mg | ORAL_TABLET | Freq: Every day | ORAL | 3 refills | Status: DC

## 2017-01-25 NOTE — Progress Notes (Signed)
Pre visit review using our clinic review tool, if applicable. No additional management support is needed unless otherwise documented below in the visit note. 

## 2017-01-25 NOTE — Patient Instructions (Signed)
WE NOW OFFER   [facility] FAST TRACK!!!  SAME DAY Appointments for ACUTE CARE  Such as: Sprains, Injuries, cuts, abrasions, rashes, muscle pain, joint pain, back pain Colds, flu, sore throats, headache, allergies, cough, fever  Ear pain, sinus and eye infections Abdominal pain, nausea, vomiting, diarrhea, upset stomach Animal/insect bites  3 Easy Ways to Schedule: Walk-In Scheduling Call in scheduling Mychart Sign-up: [email or web address]         
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
## 2017-02-20 ENCOUNTER — Ambulatory Visit (INDEPENDENT_AMBULATORY_CARE_PROVIDER_SITE_OTHER): Payer: Medicare Other | Admitting: Family Medicine

## 2017-02-20 ENCOUNTER — Encounter: Payer: Self-pay | Admitting: Family Medicine

## 2017-02-20 VITALS — BP 152/80 | Temp 98.3°F | Ht 64.0 in | Wt 265.0 lb

## 2017-02-20 DIAGNOSIS — E1165 Type 2 diabetes mellitus with hyperglycemia: Secondary | ICD-10-CM

## 2017-02-20 DIAGNOSIS — I10 Essential (primary) hypertension: Secondary | ICD-10-CM

## 2017-02-20 DIAGNOSIS — E08319 Diabetes mellitus due to underlying condition with unspecified diabetic retinopathy without macular edema: Secondary | ICD-10-CM | POA: Diagnosis not present

## 2017-02-20 DIAGNOSIS — E782 Mixed hyperlipidemia: Secondary | ICD-10-CM | POA: Diagnosis not present

## 2017-02-20 DIAGNOSIS — D508 Other iron deficiency anemias: Secondary | ICD-10-CM

## 2017-02-20 DIAGNOSIS — E113219 Type 2 diabetes mellitus with mild nonproliferative diabetic retinopathy with macular edema, unspecified eye: Secondary | ICD-10-CM | POA: Diagnosis not present

## 2017-02-20 LAB — POC URINALSYSI DIPSTICK (AUTOMATED)
Bilirubin, UA: NEGATIVE
Blood, UA: NEGATIVE
Ketones, UA: NEGATIVE
Leukocytes, UA: NEGATIVE
Nitrite, UA: NEGATIVE
Spec Grav, UA: >=1.03 — AB (ref 1.010–1.025)
Urobilinogen, UA: 0.2 E.U./dL
pH, UA: 6 (ref 5.0–8.0)

## 2017-02-20 LAB — CBC WITH DIFFERENTIAL/PLATELET
Basophils Absolute: 0 10*3/uL (ref 0.0–0.1)
Basophils Relative: 0.5 % (ref 0.0–3.0)
Eosinophils Absolute: 0.1 10*3/uL (ref 0.0–0.7)
Eosinophils Relative: 0.9 % (ref 0.0–5.0)
HCT: 36.4 % (ref 36.0–46.0)
Hemoglobin: 11.6 g/dL — ABNORMAL LOW (ref 12.0–15.0)
Lymphocytes Relative: 33.5 % (ref 12.0–46.0)
Lymphs Abs: 2.9 10*3/uL (ref 0.7–4.0)
MCHC: 31.9 g/dL (ref 30.0–36.0)
MCV: 80.4 fl (ref 78.0–100.0)
Monocytes Absolute: 0.5 10*3/uL (ref 0.1–1.0)
Monocytes Relative: 6.2 % (ref 3.0–12.0)
Neutro Abs: 5.2 10*3/uL (ref 1.4–7.7)
Neutrophils Relative %: 58.9 % (ref 43.0–77.0)
Platelets: 216 10*3/uL (ref 150.0–400.0)
RBC: 4.52 Mil/uL (ref 3.87–5.11)
RDW: 14.7 % (ref 11.5–15.5)
WBC: 8.8 10*3/uL (ref 4.0–10.5)

## 2017-02-20 LAB — BASIC METABOLIC PANEL
BUN: 17 mg/dL (ref 6–23)
CO2: 29 mEq/L (ref 19–32)
Calcium: 9.3 mg/dL (ref 8.4–10.5)
Chloride: 105 mEq/L (ref 96–112)
Creatinine, Ser: 0.74 mg/dL (ref 0.40–1.20)
GFR: 100.72 mL/min (ref >60.00)
Glucose, Bld: 254 mg/dL — ABNORMAL HIGH (ref 70–99)
Potassium: 4.3 mEq/L (ref 3.5–5.1)
Sodium: 142 mEq/L (ref 135–145)

## 2017-02-20 LAB — LIPID PANEL
Cholesterol: 165 mg/dL (ref 0–200)
HDL: 44.5 mg/dL (ref >39.00)
LDL Cholesterol: 98 mg/dL (ref 0–99)
NonHDL: 120.33
Total CHOL/HDL Ratio: 4
Triglycerides: 113 mg/dL (ref 0.0–149.0)
VLDL: 22.6 mg/dL (ref 0.0–40.0)

## 2017-02-20 LAB — HEPATIC FUNCTION PANEL
ALT: 14 U/L (ref 0–35)
AST: 16 U/L (ref 0–37)
Albumin: 4.1 g/dL (ref 3.5–5.2)
Alkaline Phosphatase: 94 U/L (ref 39–117)
Bilirubin, Direct: 0.1 mg/dL (ref 0.0–0.3)
Total Bilirubin: 0.5 mg/dL (ref 0.2–1.2)
Total Protein: 7.2 g/dL (ref 6.0–8.3)

## 2017-02-20 LAB — HEMOGLOBIN A1C: Hgb A1c MFr Bld: 10.5 % — ABNORMAL HIGH (ref 4.6–6.5)

## 2017-02-20 LAB — TSH: TSH: 0.81 u[IU]/mL (ref 0.35–4.50)

## 2017-02-20 NOTE — Progress Notes (Signed)
   Subjective:    Patient ID: Krista Benjamin, female    DOB: 10-Mar-1951, 66 y.o.   MRN: [id number]  HPI Here to follow up on HTN and for other issues. At our last visit we increased the dose of her Metoprolol and her systolic pressure has come down nicely. She feels well in general. She would like to see a different retina specialist in the future (she had been seeing Dr. Alan Mulder). She sees Dr. Elvera Lennox regularly for the diabetes.    Review of Systems  Constitutional: Negative.   HENT: Negative.   Eyes: Negative.   Respiratory: Negative.   Cardiovascular: Negative.   Gastrointestinal: Negative.   Genitourinary: Negative for decreased urine volume, difficulty urinating, dyspareunia, dysuria, enuresis, flank pain, frequency, hematuria, pelvic pain and urgency.  Musculoskeletal: Negative.   Skin: Negative.   Neurological: Negative.   Psychiatric/Behavioral: Negative.        Objective:   Physical Exam  Constitutional: She is oriented to person, place, and time. She appears well-developed and well-nourished. No distress.  HENT:  Head: Normocephalic and atraumatic.  Right Ear: External ear normal.  Left Ear: External ear normal.  Nose: Nose normal.  Mouth/Throat: Oropharynx is clear and moist. No oropharyngeal exudate.  Eyes: Conjunctivae and EOM are normal. Pupils are equal, round, and reactive to light. No scleral icterus.  Neck: Normal range of motion. Neck supple. No JVD present. No thyromegaly present.  Cardiovascular: Normal rate, regular rhythm, normal heart sounds and intact distal pulses.  Exam reveals no gallop and no friction rub.   No murmur heard. Pulmonary/Chest: Effort normal and breath sounds normal. No respiratory distress. She has no wheezes. She has no rales. She exhibits no tenderness.  Abdominal: Soft. Bowel sounds are normal. She exhibits no distension and no mass. There is no tenderness. There is no rebound and no guarding.  Musculoskeletal: Normal range  of motion. She exhibits no edema or tenderness.  Lymphadenopathy:    She has no cervical adenopathy.  Neurological: She is alert and oriented to person, place, and time. She has normal reflexes. No cranial nerve deficit. She exhibits normal muscle tone. Coordination normal.  Skin: Skin is warm and dry. No rash noted. No erythema.  Psychiatric: She has a normal mood and affect. Her behavior is normal. Judgment and thought content normal.          Assessment & Plan:  Her HTN is now well controlled. Get fasting labs today including an A1c to follow her diabetes and her hyperlipidemia. Her low back pain is stable. For her retina disease we will refer her to Dr. Fawn Kirk.  Gershon Crane, MD

## 2017-02-20 NOTE — Patient Instructions (Signed)
WE NOW OFFER    [facility] FAST TRACK!!!  SAME DAY Appointments for ACUTE CARE  Such as: Sprains, Injuries, cuts, abrasions, rashes, muscle pain, joint pain, back pain Colds, flu, sore throats, headache, allergies, cough, fever  Ear pain, sinus and eye infections Abdominal pain, nausea, vomiting, diarrhea, upset stomach Animal/insect bites  3 Easy Ways to Schedule: Walk-In Scheduling Call in scheduling Mychart Sign-up: [email or web address]         

## 2017-03-21 LAB — HM DIABETES EYE EXAM

## 2017-03-23 ENCOUNTER — Telehealth: Payer: Self-pay | Admitting: Family Medicine

## 2017-03-23 NOTE — Telephone Encounter (Signed)
She wants the blood test done if possible.

## 2017-03-23 NOTE — Telephone Encounter (Signed)
Patient is wanting to know if she can get the cologuard home kit sent to her house? [place] [facility] said that she is eligible for it. Please advise

## 2017-03-24 NOTE — Telephone Encounter (Signed)
Order form was signed and faxed today.  

## 2017-03-24 NOTE — Telephone Encounter (Signed)
Form filled out for Cologuard.  Will need to give to Dr. Clent Ridges for signature.

## 2017-04-03 LAB — COLOGUARD: Cologuard: NEGATIVE

## 2017-04-10 ENCOUNTER — Encounter: Payer: Self-pay | Admitting: Family Medicine

## 2017-04-27 ENCOUNTER — Ambulatory Visit (INDEPENDENT_AMBULATORY_CARE_PROVIDER_SITE_OTHER): Payer: Medicare Other | Admitting: Internal Medicine

## 2017-04-27 ENCOUNTER — Encounter: Payer: Self-pay | Admitting: Internal Medicine

## 2017-04-27 VITALS — BP 140/92 | HR 65 | Ht 64.5 in | Wt 259.0 lb

## 2017-04-27 DIAGNOSIS — N182 Chronic kidney disease, stage 2 (mild): Secondary | ICD-10-CM | POA: Diagnosis not present

## 2017-04-27 DIAGNOSIS — E1122 Type 2 diabetes mellitus with diabetic chronic kidney disease: Secondary | ICD-10-CM

## 2017-04-27 DIAGNOSIS — E1165 Type 2 diabetes mellitus with hyperglycemia: Secondary | ICD-10-CM

## 2017-04-27 DIAGNOSIS — E113219 Type 2 diabetes mellitus with mild nonproliferative diabetic retinopathy with macular edema, unspecified eye: Secondary | ICD-10-CM | POA: Diagnosis not present

## 2017-04-27 LAB — POCT GLYCOSYLATED HEMOGLOBIN (HGB A1C): Hemoglobin A1C: 9.2

## 2017-04-27 MED ORDER — EMPAGLIFLOZIN 10 MG PO TABS: 10.0000 mg | ORAL_TABLET | Freq: Every day | ORAL | 11 refills | Status: DC

## 2017-04-27 NOTE — Patient Instructions (Addendum)
Please continue: - Metformin ER 500 mg in am and 1000 mg at night - with meals - Tradjenta 5 mg daily - Amaryl  4 mg 2x a day  Please add: - Jardiance 10 mg daily before b'fast  Please return in 3 months with your sugar log.

## 2017-04-27 NOTE — Addendum Note (Signed)
Addended by: Darene Lamer T on: 04/27/2017 08:30 AM   Modules accepted: Orders

## 2017-04-27 NOTE — Progress Notes (Signed)
Patient ID: Krista Benjamin, female   DOB: [birth year], 66 y.o.   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  Last visit 6 mo ago. She did not return in 3 months, as advised. She has [employer] + [employer].   She had a lot of stress sce last visit, including sister killed by [place].  She restarted exercise: yoga, aerobics, Silver sneakers  Last hemoglobin A1c was higher:  Lab Results  Component Value Date   HGBA1C 10.5 (H) 02/20/2017   HGBA1C 8.4 10/12/2016   HGBA1C 8.2 05/25/2016  Prev. 5.9% on 12/2012, previously 6.1%.  Pt is on a regimen of: - Metformin ER 500 mg in am and 1000 mg at night - with meals - Tradjenta 5 mg daily - Amaryl  4 mg 2x a day Reviewed previous medications tried: We tried Invokana 100 mg in am >> stopped after 3 days >> nausea, diarrhea We stopped Lantus 10 units qhs. We stopped Actos 45 mg (was on it since 07/2001) Metformin 1000 mg bid >> stopped b/c Nausea/Diarrhea Metformin 1000 mg po bid >> did not take in the past, as she had mild CKD. She was on Novolog 8 units tid ac, when sugars >140   Pt checks her sugars 1-2 times a day: - am: 61, 80-167, 217 (forgot meds)>> 97-126 >> 149-310 - 2h after b'fast:  173 >> 144 >> 115-174, 249 >> n/c >> 152-204 - lunch: 160-170 >> 116 >> 140, 240 (Holiday, forgot meds) - 2h after lunch:  n/c  >> 126 >> n/c >> 230, 340 (no meds) >> 201 - dinner:  165, 222 (ate late) >> 85, 137-169 >> n/c >> 114- - 2h after dinner:  89x1, 180-200s >> 154-181, 205 >> 161-206 >> 157, 177 - bedtime: 105 >> 98-197 >> n/c >> 133-215, 270 >> 152-238, 325 - night: 129, 188-254 >> 157, 171 >> 68x1 >> ?  >> n/c Lowest CBG: 97 >. ; she has hypoglycemia awareness at 70s.  Highest sugar was 240 >> .  Glucometer: One Touch Verio Flex  Pt's meals are:  - Breakfast: smoothies - Lunch: meat + veggies  - mostly skips - Dinner: meat + veggies - Snacks: 1-2, fruit, yoghurt  She lost 80 lbs since 2012 >> could come off insulin.  She saw Oran Rein (nutritionist) on 01/01/2015.  - She has CKD stage II. Latest BUN/creatinine was:  Lab Results  Component Value Date   BUN 17 02/20/2017   Lab Results  Component Value Date   CREATININE 0.74 02/20/2017  On quinapril.  - No hyperlipidemia. Last lipid profile: Lab Results  Component Value Date   CHOL 165 02/20/2017   HDL 44.50 02/20/2017   LDLCALC 98 02/20/2017   TRIG 113.0 02/20/2017   CHOLHDL 4 02/20/2017  On Atorvastatin. - she has Background diabetic retinopathy, for which she was seeing Dr. Ashley Royalty >> starts seeing another Dr.. She also had cataract surgeries bilaterally. Last eye exam 10/2016. - she denies numbness and tingling in her legs. Foot exam normal - 08/2015.  She also has a history of hypertension, hyperlipidemia-on Lipitor, iron deficiency anemia-on iron therapy, aortic valve disease/murmur, left ventricular hypertrophy per EKG, obesity, multinodular goiter, fibroids.  She had shingles in 11/2014 and 02/2016.   ROS: Constitutional: + weight gain/+ weight loss, no fatigue, no subjective hyperthermia, no subjective hypothermia Eyes: no blurry vision, no xerophthalmia ENT: no sore  throat, no nodules palpated in throat, no dysphagia, no odynophagia, no hoarseness Cardiovascular: no CP/no SOB/no palpitations/no leg swelling Respiratory: no cough/no SOB/+ wheezing Gastrointestinal: no N/no V/no D/no C/no acid reflux Musculoskeletal: no muscle aches/no joint aches Skin: no rashes, no hair loss Neurological: no tremors/no numbness/no tingling/no dizziness  I reviewed pt's medications, allergies, PMH, social hx, family hx, and changes were documented in the history of present illness. Otherwise, unchanged from my initial visit note.  PE: BP (!) 140/92 (BP Location: Left Arm, Patient Position: Sitting)   Pulse 65   Ht  5' 4.5" (1.638 m)   Wt 259 lb (117.5 kg)   SpO2 97%   BMI 43.77 kg/m  Body mass index is 43.77 kg/m.  Wt Readings from Last 3 Encounters:  04/27/17 259 lb (117.5 kg)  02/20/17 265 lb (120.2 kg)  01/25/17 271 lb (122.9 kg)   Constitutional: obese, in NAD Eyes: PERRLA, EOMI, no exophthalmos ENT: moist mucous membranes, no thyromegaly, no cervical lymphadenopathy Cardiovascular: RRR, No MRG, + LE swelling Respiratory: CTA B Gastrointestinal: abdomen soft, NT, ND, BS+ Musculoskeletal: no deformities, strength intact in all 4 Skin: moist, warm, no rashes Neurological: no tremor with outstretched hands, DTR normal in all 4 Diabetic Foot Exam - Simple   Simple Foot Form Diabetic Foot exam was performed with the following findings:  Yes 04/27/2017  8:22 AM  Visual Inspection Sensation Testing Intact to touch and monofilament testing bilaterally:  Yes Pulse Check Posterior Tibialis and Dorsalis pulse intact bilaterally:  Yes Comments + B periankle swelling, R>L     ASSESSMENT: 1. DM2, non-insulin-dependent, uncontrolled, with complications - CKD stage 2 - background DR  2. CKD stage 2 - 2/2 DM2  PLAN:  1.  Patient with long-standing diabetes, Poorly controlled. She returns after a long absence and her HbA1c obtained 2 months ago was higher than before. She lost weight and restarted exercise. Sugars are still high >> will try to add Jardiance. - we discussed about SEs ofJardiance, which are: dizziness (advised to be careful when stands from sitting position), decreased BP - usually not < normal (BP today is not low), and fungal UTIs (advised to let me know if develops one).  - I advised her to:  Patient Instructions  Please continue: - Metformin ER 500 mg in am and 1000 mg at night - with meals - Tradjenta 5 mg daily - Amaryl  4 mg 2x a day  Please add: - Jardiance 10 mg daily before b'fast  Please return in 3 months with your sugar log.   - today, HbA1c is 9.2%  (better) - continue checking sugars at different times of the day - check 2x a day, rotating checks - advised for yearly eye exams >> she is UTD - she has retinopathy - Return to clinic in 3 mo with sugar log   2. CKD stage 2 - Reviewed recent kidney function (02/2017) and this appears stable >> will recheck at next visit as we are starting Jardiance - continues quinapril  Carlus Pavlov, MD PhD [facility] Endocrinology

## 2017-04-28 ENCOUNTER — Telehealth: Payer: Self-pay | Admitting: Internal Medicine

## 2017-04-28 NOTE — Telephone Encounter (Signed)
**  Remind patient they can make refill requests via MyChart**  Medication refill request (Name & Dosage): TRADJENTA 5 MG TABS tablet   Preferred pharmacy (Name & Address): [id number] - [place], [place] - [address] [phone] (Phone) [phone] (Fax)       Other comments (if applicable):   Patient would also like RX empagliflozin (JARDIANCE) 10 MG TABS tablet sent via mail in pharmacy like the Tradjenta.   Please advise patient when RX refilled.

## 2017-05-01 ENCOUNTER — Telehealth: Payer: Self-pay

## 2017-05-01 ENCOUNTER — Other Ambulatory Visit: Payer: Self-pay | Admitting: Internal Medicine

## 2017-05-01 NOTE — Telephone Encounter (Signed)
Called and notified patient of MD note.  

## 2017-05-01 NOTE — Telephone Encounter (Signed)
Routing to you °

## 2017-05-01 NOTE — Telephone Encounter (Signed)
Called and LVM advising patient of MD note.

## 2017-05-01 NOTE — Telephone Encounter (Signed)
Patient called in to verify is she is supposed to be taking the trajenta with jardiance. Please call patient on cell # to verify either way on how she should proceed

## 2017-05-01 NOTE — Telephone Encounter (Signed)
Yes, both before b'fast

## 2017-05-01 NOTE — Telephone Encounter (Signed)
Per the last OV she is suppose to take these together, is this still correct? Thank you!

## 2017-05-02 ENCOUNTER — Ambulatory Visit: Payer: Medicare Other | Admitting: Internal Medicine

## 2017-05-03 ENCOUNTER — Telehealth: Payer: Self-pay | Admitting: Internal Medicine

## 2017-05-03 ENCOUNTER — Other Ambulatory Visit: Payer: Self-pay

## 2017-05-03 MED ORDER — FLUCONAZOLE 150 MG PO TABS: 150.0000 mg | ORAL_TABLET | Freq: Every day | ORAL | 1 refills | Status: DC

## 2017-05-03 NOTE — Telephone Encounter (Signed)
What would you like to send in?  Thank you !

## 2017-05-03 NOTE — Telephone Encounter (Signed)
LVM advising.

## 2017-05-03 NOTE — Telephone Encounter (Signed)
Submitted

## 2017-05-03 NOTE — Telephone Encounter (Signed)
Yes, please send diflucan 150 mg with 1 refill

## 2017-05-03 NOTE — Telephone Encounter (Signed)
Patient called in reference to RX  empagliflozin (JARDIANCE) 10 MG TABS tablet she believes to be causing a yeast infection. [facility]  [address] [place], [place] [address] Phone number : [phone]  Please call patient and advise. OK to leave message if no answer

## 2017-05-05 ENCOUNTER — Telehealth: Payer: Self-pay

## 2017-05-05 NOTE — Telephone Encounter (Signed)
Called and LVM advising patient of the note from MD.  Left call back number if any questions.

## 2017-05-05 NOTE — Telephone Encounter (Signed)
Burning when urinating, please call to advise. Patient is on the move.  Thank you,  -LL

## 2017-05-05 NOTE — Telephone Encounter (Signed)
Sometimes eliminating Glu through urine (from her Marcelline Deist)  may cause that w/o a UTI >> she will need to have a urinalysis done, so she needs to see PCP or a walk-in clinic if still at the [facility].

## 2017-05-05 NOTE — Telephone Encounter (Signed)
Called and LVM advising patient of MD note, left call back number.

## 2017-05-05 NOTE — Telephone Encounter (Signed)
See pcp?? Thanks!

## 2017-05-19 ENCOUNTER — Other Ambulatory Visit: Payer: Self-pay | Admitting: Family Medicine

## 2017-06-06 ENCOUNTER — Encounter: Payer: Medicare Other | Admitting: Family Medicine

## 2017-06-18 ENCOUNTER — Encounter (HOSPITAL_COMMUNITY): Payer: Self-pay | Admitting: *Deleted

## 2017-06-18 ENCOUNTER — Ambulatory Visit (HOSPITAL_COMMUNITY)
Admission: EM | Admit: 2017-06-18 | Discharge: 2017-06-18 | Disposition: A | Discharge status: Home / Self Care | Payer: Medicare Other | Attending: Emergency Medicine | Admitting: Emergency Medicine

## 2017-06-18 ENCOUNTER — Ambulatory Visit (INDEPENDENT_AMBULATORY_CARE_PROVIDER_SITE_OTHER): Payer: Medicare Other

## 2017-06-18 DIAGNOSIS — M79641 Pain in right hand: Secondary | ICD-10-CM | POA: Diagnosis not present

## 2017-06-18 MED ORDER — KETOROLAC TROMETHAMINE 30 MG/ML IJ SOLN
30.0000 mg | Freq: Once | INTRAMUSCULAR | Status: AC
  Administered 2017-06-18: 30 mg via INTRAMUSCULAR

## 2017-06-18 MED ORDER — KETOROLAC TROMETHAMINE 30 MG/ML IJ SOLN
INTRAMUSCULAR | Status: AC
  Filled 2017-06-18: qty 1

## 2017-06-18 MED ORDER — IBUPROFEN 600 MG PO TABS: 600.0000 mg | ORAL_TABLET | Freq: Two times a day (BID) | ORAL | 0 refills | Status: DC

## 2017-06-18 NOTE — ED Triage Notes (Signed)
C/O right hand and wrist pain since last night with significant worsening today.  Unable to let RUE dangle at side or move hand/fingers due to pain.  All fingers warm with prompt cap refill.

## 2017-06-18 NOTE — ED Provider Notes (Signed)
[facility]    CSN: [id number] Arrival date & time: 06/18/17  1618   History   Chief Complaint Chief Complaint  Patient presents with  . Hand Pain   HPI   Krista Benjamin is a 66 y.o. female presenting today with reports of right wrist and thumb pain that is acute in onset. Patient states her son has been admitted to the hospital and she was visiting him. Patient is in tears secondary to discomfort and denies any known injury. Patient states that it does appear slightly swollen at base of thumb and historically was told that she has carpal tunnel syndrome in that wrist which she has not had surgically operated on. Denies hx of kidney or renal disease.   Past Medical History:  Diagnosis Date  . Anemia   . Aortic stenosis    Mild  . Cataract   . Diabetes mellitus    sees Dr. Elvera Lennox   . Gynecological examination    sees Dr. Jennette Kettle   . Hyperlipidemia   . Hypertension    sees Dr. Angelina Sheriff    Patient Active Problem List   Diagnosis Date Noted  . CKD stage 2 due to type 2 diabetes mellitus (HCC) 04/27/2017  . Uncontrolled type 2 diabetes mellitus with retinopathy, without long-term current use of insulin (HCC) 11/27/2015  . Bronchitis with bronchospasm 03/16/2013  . AORTIC VALVE DISORDERS 04/26/2010  . OBESITY, UNSPECIFIED 05/07/2009  . MURMUR 05/07/2009  . CHEST PAIN 05/07/2009  . LOW BACK PAIN 01/09/2008  . ANEMIA-IRON DEFICIENCY 11/21/2007  . Weakness 11/21/2007  . Hyperlipemia, mixed 10/26/2007  . Essential hypertension 10/26/2007    Past Surgical History:  Procedure Laterality Date  . CARPAL TUNNEL RELEASE  01/22/14   Left Hand   . CATARACT EXTRACTION     Lens surgery  . COLONOSCOPY  11-26-07   per Dr. Jarold Motto, repeat in 10 yrs   . excision of benign cyst     from left maxilla and left maxillary sinus  . KNEE SURGERY    . SHOULDER ARTHROSCOPY W/ ROTATOR CUFF REPAIR Left 09-23-13   per Dr. Delrae Sawyers at [facility] in [place] History    No data available       Home Medications    Prior to Admission medications   Medication Sig Start Date End Date Taking? Authorizing Provider  aspirin 81 MG tablet Take 81 mg by mouth at bedtime.    Yes [provider]  atorvastatin (LIPITOR) 40 MG tablet TAKE 1 TABLET AT BEDTIME 05/19/17  Yes Nelwyn Salisbury, MD  bumetanide (BUMEX) 1 MG tablet Take 2 tablets (2 mg total) by mouth at bedtime. 12/16/16  Yes Nelwyn Salisbury, MD  cholecalciferol (VITAMIN D) 1000 UNITS tablet Take 2,000 Units by mouth at bedtime.    Yes [provider]  DILT-XR 240 MG 24 hr capsule TAKE 1 CAPSULE DAILY 09/22/16  Yes Nelwyn Salisbury, MD  [id number] Ac [facility] 28) MISC TAKE 1 TABLET DAILY 12/08/16  Yes Nelwyn Salisbury, MD  glimepiride (AMARYL) 4 MG tablet Take 1 tablet (4 mg total) by mouth 2 (two) times daily before a meal. 10/12/16  Yes Carlus Pavlov, MD  glucose blood (ONETOUCH VERIO) test strip USE TO TEST BLOOD SUGAR TWICE DAILY. DX: W09.811 02/23/16  Yes Carlus Pavlov, MD  metFORMIN (GLUCOPHAGE-XR) 500 MG 24 hr tablet TAKE 2 TABLETS TWICE A DAY WITH MEALS 10/12/16  Yes Carlus Pavlov, MD  metoprolol succinate (  TOPROL-XL) 100 MG 24 hr tablet Take 1 tablet (100 mg total) by mouth daily. Take with or immediately following a meal. Patient taking differently: Take 200 mg by mouth daily. Take with or immediately following a meal. 01/25/17  Yes Nelwyn Salisbury, MD  [facility] DELICA LANCETS 33G MISC Use to check blood sugar twice a day.  DX  E11.319 06/01/16  Yes Carlus Pavlov, MD  quinapril (ACCUPRIL) 20 MG tablet TAKE 1 TABLET AT BEDTIME 05/19/17  Yes Nelwyn Salisbury, MD  TRADJENTA 5 MG TABS tablet TAKE 1 TABLET (5 MG TOTAL) BY MOUTH EVERY MORNING. AS NEEDED 05/01/17  Yes Carlus Pavlov, MD  albuterol (VENTOLIN HFA) 108 (90 Base) MCG/ACT inhaler INHALE 2 PUFFS INTO THE LUNGS EVERY 4 (FOUR) HOURS AS NEEDED FOR WHEEZING. 12/16/16   Nelwyn Salisbury, MD  Alcohol Swabs  PADS Use to check blood sugar once a day.  DX Q76.195 06/01/16   Carlus Pavlov, MD  empagliflozin (JARDIANCE) 10 MG TABS tablet Take 10 mg by mouth daily. 04/27/17   Carlus Pavlov, MD  EPIPEN 2-PAK 0.3 MG/0.3ML SOAJ injection INJECT 0.3 MLS (0.3 MG TOTAL) INTO THE MUSCLE ONCE. Patient not taking: Reported on 12/16/2016 04/19/16   Nelwyn Salisbury, MD  fluconazole (DIFLUCAN) 150 MG tablet Take 1 tablet (150 mg total) by mouth daily. 05/03/17   Carlus Pavlov, MD  ibuprofen (ADVIL,MOTRIN) 600 MG tablet Take 1 tablet (600 mg total) by mouth 2 (two) times daily. 06/18/17   Servando Salina, NP  ketoconazole (NIZORAL) 2 % cream Apply 1 application topically 2 (two) times daily. 02/16/15   Nelwyn Salisbury, MD  OVER THE COUNTER MEDICATION Caramin as needed for pain    [provider]    Family History Family History  Problem Relation Age of Onset  . Coronary artery disease Brother   . Coronary artery disease Mother   . Cancer Unknown        fhx  . Diabetes Unknown        fhx  . Hyperlipidemia Unknown        fhx  . Hypertension Unknown        fhx  . Stroke Unknown        fhx    Social History Social History  Substance Use Topics  . Smoking status: Never Smoker  . Smokeless tobacco: Never Used  . Alcohol use No     Allergies   Patient has no known allergies.   Review of Systems Review of Systems  Constitutional: Negative.  Negative for fatigue and fever.  HENT: Negative.   Eyes: Negative.  Negative for visual disturbance.  Respiratory: Negative.  Negative for cough, chest tightness and shortness of breath.   Cardiovascular: Negative.  Negative for chest pain and leg swelling.  Gastrointestinal: Negative.   Endocrine: Negative.   Genitourinary: Negative.   Musculoskeletal: Positive for joint swelling. Negative for gait problem and neck stiffness.       Right wrist, hand and thumb pain. States discomfort is significantly worse with movement and is reproducible.    Skin: Negative.  Negative for wound.  Allergic/Immunologic: Negative.   Neurological: Negative.  Negative for dizziness, tremors, seizures, syncope, facial asymmetry, speech difficulty, weakness, light-headedness, numbness and headaches.  Hematological: Negative.   Psychiatric/Behavioral: Negative.      Physical Exam Triage Vital Signs ED Triage Vitals  Enc Vitals Group     BP 06/18/17 1640 (!) 181/74     Pulse Rate 06/18/17 1640 73  Resp 06/18/17 1640 16     Temp 06/18/17 1640 98.5 F (36.9 C)     Temp Source 06/18/17 1640 Oral     SpO2 06/18/17 1640 99 %     Weight --      Height --      Head Circumference --      Peak Flow --      Pain Score 06/18/17 1641 10     Pain Loc --      Pain Edu? --      Excl. in GC? --    No data found.   Updated Vital Signs BP (!) 181/74   Pulse 73   Temp 98.5 F (36.9 C) (Oral)   Resp 16   SpO2 99%   Visual Acuity Right Eye Distance:   Left Eye Distance:   Bilateral Distance:    Right Eye Near:   Left Eye Near:    Bilateral Near:     Physical Exam  Constitutional: She is oriented to person, place, and time. She appears well-developed and well-nourished. No distress.  Eyes: Pupils are equal, round, and reactive to light. Conjunctivae are normal. Right eye exhibits no discharge. Left eye exhibits no discharge. No scleral icterus.  Neck: Normal range of motion. Neck supple.  Cardiovascular: Normal rate, regular rhythm, normal heart sounds and intact distal pulses.  Exam reveals no gallop and no friction rub.   No murmur heard. Pulmonary/Chest: Effort normal and breath sounds normal. No respiratory distress. She has no wheezes. She has no rales. She exhibits no tenderness.  Musculoskeletal: She exhibits edema.  The right thenar eminence is significantly swollen as compared to the left hand. No obvious asymmetry or deformity as compared to the right. No erythema, atrophy, surface trauma, or open wounds. Normal flexion and  extension of fingers, slowed with right thumb secondary to discomfort. FDS and FDP intact against resistance. No focal fullness, throbbing pain, swelling the fingertip. Cap refill less than 3 seconds. CMS intact  Neurological: She is alert and oriented to person, place, and time. She displays normal reflexes. No sensory deficit. She exhibits normal muscle tone. Coordination normal.  Skin: Capillary refill takes less than 2 seconds. She is not diaphoretic.  Nursing note and vitals reviewed.    UC Treatments / Results  Labs (all labs ordered are listed, but only abnormal results are displayed) Labs Reviewed - No data to display  EKG  EKG Interpretation None       Radiology Dg Hand Complete Right  Result Date: 06/18/2017 CLINICAL DATA:  Patient with pain at the base of the thumb, worsening over night. No known injury. Initial encounter. EXAM: RIGHT HAND - COMPLETE 3+ VIEW COMPARISON:  None. FINDINGS: First [facility] joint degenerative changes. There is mild subluxation of the proximal phalanx of the thumb at the [facility] joint, likely secondary to degenerative change. No evidence for acute fracture. Regional soft tissues are unremarkable. IMPRESSION: First [facility] joint degenerative changes with mild volar subluxation of the proximal aspect of the proximal phalanx of the thumb in relation to the [facility] joint. This is favored to be degenerative in etiology. Electronically Signed   By: Annia Belt M.D.   On: 06/18/2017 17:48    Procedures Procedures (including critical care time)  Medications Ordered in UC Medications  ketorolac (TORADOL) 30 MG/ML injection 30 mg (30 mg Intramuscular Given 06/18/17 1715)     Initial Impression / Assessment and Plan / UC Course  I have reviewed the triage vital signs and  the nursing notes.  Pertinent labs & imaging results that were available during my care of the patient were reviewed by me and considered in my medical decision making (see chart for details).      Patient got excellent pain relief with  of IM toradol.    Final Clinical Impressions(s) / UC Diagnoses   Final diagnoses:  Right hand pain    New Prescriptions New Prescriptions   IBUPROFEN (ADVIL,MOTRIN) 600 MG TABLET    Take 1 tablet (600 mg total) by mouth 2 (two) times daily.     Controlled Substance Prescriptions  Controlled Substance Registry consulted? Not Applicable   Patient given splint for comfort and agrees to plan of care.  Work note until Wednesday and she will follow up with her Orthopedist first thing in am.  The usual and customary discharge instructions and warnings were given.  The patient verbalizes understanding and agrees to plan of care.      Servando Salina, NP 06/18/17 1819

## 2017-06-18 NOTE — Discharge Instructions (Signed)
Follow up with your orthopedist of choice as soon as possible.

## 2017-06-18 NOTE — ED Notes (Signed)
Discussed splint with cathi rossi, np, decided on thumb spica for patient comfort

## 2017-06-19 ENCOUNTER — Other Ambulatory Visit: Payer: Self-pay | Admitting: Family Medicine

## 2017-06-28 ENCOUNTER — Telehealth: Payer: Self-pay | Admitting: Family Medicine

## 2017-06-28 NOTE — Telephone Encounter (Signed)
I filled out this form this morning and sent it to the front office to be faxed

## 2017-06-28 NOTE — Telephone Encounter (Signed)
Pt is having surgery on her hand by Dr Madilyn Fireman and wants to know if we have seen the surgical clearance letter from his office.  Pt surgery is 10/02 and they need the form sent back to their office asap. Please advise

## 2017-06-29 ENCOUNTER — Encounter: Payer: Self-pay | Admitting: Family Medicine

## 2017-06-30 NOTE — Telephone Encounter (Signed)
Can you please look up front for me?

## 2017-08-01 ENCOUNTER — Ambulatory Visit (INDEPENDENT_AMBULATORY_CARE_PROVIDER_SITE_OTHER): Payer: Medicare Other | Admitting: Internal Medicine

## 2017-08-01 ENCOUNTER — Encounter: Payer: Self-pay | Admitting: Internal Medicine

## 2017-08-01 VITALS — BP 128/82 | HR 57 | Wt 257.0 lb

## 2017-08-01 DIAGNOSIS — E1165 Type 2 diabetes mellitus with hyperglycemia: Secondary | ICD-10-CM

## 2017-08-01 DIAGNOSIS — N182 Chronic kidney disease, stage 2 (mild): Secondary | ICD-10-CM | POA: Diagnosis not present

## 2017-08-01 DIAGNOSIS — E11319 Type 2 diabetes mellitus with unspecified diabetic retinopathy without macular edema: Secondary | ICD-10-CM | POA: Diagnosis not present

## 2017-08-01 DIAGNOSIS — Z23 Encounter for immunization: Secondary | ICD-10-CM | POA: Diagnosis not present

## 2017-08-01 DIAGNOSIS — E1122 Type 2 diabetes mellitus with diabetic chronic kidney disease: Secondary | ICD-10-CM | POA: Diagnosis not present

## 2017-08-01 DIAGNOSIS — IMO0002 Reserved for concepts with insufficient information to code with codable children: Secondary | ICD-10-CM

## 2017-08-01 LAB — POCT GLYCOSYLATED HEMOGLOBIN (HGB A1C): Hemoglobin A1C: 8.8

## 2017-08-01 MED ORDER — GLIMEPIRIDE 4 MG PO TABS: 6.0000 mg | ORAL_TABLET | Freq: Two times a day (BID) | ORAL | 3 refills | Status: DC

## 2017-08-01 NOTE — Addendum Note (Signed)
Addended by: Darene Lamer T on: 08/01/2017 10:49 AM   Modules accepted: Orders

## 2017-08-01 NOTE — Progress Notes (Signed)
Patient ID: Krista Benjamin, female   DOB: Mar 06, 1951, 66 y.o.   MRN: [id number]  HPI: Krista Benjamin is a 66 y.o.-year-old female, returning for f/u for DM2, dx 1994, non insulin-dependent, uncontrolled, with complications (CKD stage 2-3, background DR).  Last visit 3 mo ago. She has [employer] + [employer].   She was exercising: yoga, aerobics, Silver sneakers. Now, had elbow surgery 07/12/2017.   Last hemoglobin A1c:  Lab Results  Component Value Date   HGBA1C 9.2 04/27/2017   HGBA1C 10.5 (H) 02/20/2017   HGBA1C 8.4 10/12/2016  Prev. 5.9% on 12/2012, previously 6.1%.  Pt is on a regimen of: - Metformin ER 500 mg in am and 1000 mg at night - with meals - Tradjenta 5 mg daily - Amaryl  6 mg 2x a day Reviewed previous medications tried: We tried Jardiance 10 mg daily in am - added 04/2017 >> yeast inf >> stopped We tried Invokana 100 mg in am >> stopped after 3 days >> nausea, diarrhea We stopped Lantus 10 units qhs. We stopped Actos 45 mg (was on it since 07/2001) Metformin 1000 mg bid >> stopped b/c Nausea/Diarrhea Metformin 1000 mg po bid >> did not take in the past, as she had mild CKD. She was on Novolog 8 units tid ac, when sugars >140   Pt checks her sugars 1-2x a day: - am: 61, 80-167, 217 (forgot meds)>> 97-126 >> 149-310 >> 84-159 - 2h after b'fast:  144 >> 115-174, 249 >> n/c >> 152-204 >> 111-146, 276 - lunch: 116 >> 140, 240 (Holiday, forgot meds) >> n/c - 2h after lunch:  126 >> n/c >> 230, 340 (no meds) >> 201 >> 303 - dinner:  165, 222 (ate late) >> 85, 137-169 >> n/c >> 114 >> 137, 207 - 2h after dinner: 154-181, 205 >> 161-206 >> 157, 177 >> 119-149 - bedtime:98-197 >> n/c >> 133-215, 270 >> 152-238, 325 >> see above - night: 129, 188-254 >> 157, 171 >> 68x1 >> ?  >> 76, 137 Lowest CBG: 97 >> 76; she has hypoglycemia awareness at 70s.  Highest sugar was 240 >> 276 >> 303  Glucometer: One Touch Verio Flex  Pt's meals are:  - Breakfast: smoothies - Lunch: meat  + veggies - mostly skips - Dinner: meat + veggies - Snacks: 1-2, fruit, yoghurt  She lost 80 lbs since 2012 >> could come off insulin.  She saw Oran Rein (nutritionist) on 01/01/2015.  - + CKD stage 2. Latest BUN/creatinine was:  Lab Results  Component Value Date   BUN 17 02/20/2017   Lab Results  Component Value Date   CREATININE 0.74 02/20/2017  On Quinapril  - No HL. Last lipid profile: Lab Results  Component Value Date   CHOL 165 02/20/2017   HDL 44.50 02/20/2017   LDLCALC 98 02/20/2017   TRIG 113.0 02/20/2017   CHOLHDL 4 02/20/2017  On Lipitor. - + bkgd DR  - last eye exam 10/2016. She also had cataract surgeries bilaterally. - no  numbness and tingling in her legs. Foot exam normal - 04/2017  She also has a history of hypertension, hyperlipidemia-on Lipitor, iron deficiency anemia-on iron therapy, aortic valve disease/murmur, left ventricular hypertrophy per EKG, obesity, multinodular goiter, fibroids.  She had shingles in 11/2014 and 02/2016.  ROS: Constitutional: no weight gain/no weight loss, no fatigue, no subjective hyperthermia, no subjective hypothermia Eyes: no blurry vision, no xerophthalmia ENT: no sore throat, no nodules palpated in throat, no dysphagia, no odynophagia, no hoarseness Cardiovascular:  no CP/no SOB/no palpitations/no leg swelling Respiratory: no cough/no SOB/no wheezing Gastrointestinal: no N/no V/no D/no C/no acid reflux Musculoskeletal: no muscle aches/+ joint aches Skin: no rashes, no hair loss Neurological: no tremors/no numbness/no tingling/no dizziness  I reviewed pt's medications, allergies, PMH, social hx, family hx, and changes were documented in the history of present illness. Otherwise, unchanged from my initial visit note.  PE: BP 128/82 (BP Location: Left Arm, Patient Position: Sitting)   Pulse (!) 57   Wt 257 lb (116.6 kg)   SpO2 97%   BMI 43.43 kg/m  Body mass index is 43.43 kg/m.  Wt Readings from Last 3 Encounters:   08/01/17 257 lb (116.6 kg)  04/27/17 259 lb (117.5 kg)  02/20/17 265 lb (120.2 kg)   Constitutional: obese in NAD Eyes: PERRLA, EOMI, no exophthalmos ENT: moist mucous membranes, no thyromegaly, no cervical lymphadenopathy Cardiovascular: RRR, No MRG, + B LE edema;  Respiratory: CTA B Gastrointestinal: abdomen soft, NT, ND, BS+ Musculoskeletal: no deformities, strength intact in all 4; R arm in cast Skin: moist, warm, no rashes Neurological: no tremor with outstretched hands, DTR normal in all 4  ASSESSMENT: 1. DM2, non-insulin-dependent, uncontrolled, with complications - CKD stage 2 - background DR  2. CKD stage 2 - 2/2 DM2  PLAN:  1.  Patient with long-standing diabetes, now with better control (but still hyperglycemic spikes) c/w last visit. She could not continue the SGLT inh 2/2 yeast inf. She increased her Amaryl dose instead >> sugars are better despite the fact that she did not exercise in last mo 2/2 surgery on her elbow. - will continue current regimen, but I did advise her to back off Amaryl to 4 mg if she develops any lows or if the sugars improve significantly after she restarts exercise - she did not gain weight since last visit, which is great - I advised her to:  Patient Instructions  Please continue: - Metformin ER 500 mg in am and 1000 mg at night - with meals - Tradjenta 5 mg daily - Amaryl  6 mg 2x a day  Please return in 3 months with your sugar log.   - today, HbA1c is 8.8% (slightly better) - continue checking sugars at different times of the day - check 2x a day, rotating checks - advised for yearly eye exams >> she is UTD - Given flu shot today - Return to clinic in 3 mo with sugar log   2. CKD stage 2 - Reviewed recent kidney function (02/2017) >> stable - continues Quinapril  Krista Pavlovristina Quintasha Gren, MD PhD [facility]

## 2017-08-01 NOTE — Patient Instructions (Signed)
Please continue: - Metformin ER 500 mg in am and 1000 mg at night - with meals - Tradjenta 5 mg daily - Amaryl  6 mg 2x a day  Please return in 3 months with your sugar log.

## 2017-08-11 ENCOUNTER — Telehealth: Payer: Self-pay | Admitting: Internal Medicine

## 2017-08-11 MED ORDER — GLUCOSE BLOOD VI STRP: ORAL_STRIP | 12 refills | Status: DC

## 2017-08-11 MED ORDER — GLUCOSE BLOOD VI STRP: ORAL_STRIP | 3 refills | Status: DC

## 2017-08-11 NOTE — Telephone Encounter (Signed)
Faxed new testrips for True Metrix per pt req to local and [place] pharm/thx dmf

## 2017-08-11 NOTE — Telephone Encounter (Signed)
LMOVM for pt to call back/she has 2 other meters on file/need clarification/thx dmf

## 2017-08-11 NOTE — Telephone Encounter (Signed)
Need test strips for meter true matrix's [facility]. Per patient

## 2017-08-11 NOTE — Telephone Encounter (Signed)
Patient needs prescription for test strips for TruMetrix sent into [facility] on [address]. The bulk of the test strips can come from her mail in pharmacy but she needs test strips now.

## 2017-08-21 ENCOUNTER — Other Ambulatory Visit: Payer: Self-pay | Admitting: Internal Medicine

## 2017-09-04 ENCOUNTER — Telehealth: Payer: Self-pay | Admitting: Internal Medicine

## 2017-09-04 ENCOUNTER — Other Ambulatory Visit: Payer: Self-pay

## 2017-09-04 MED ORDER — LINAGLIPTIN 5 MG PO TABS: ORAL_TABLET | ORAL | 0 refills | Status: DC

## 2017-09-04 MED ORDER — METFORMIN HCL ER 500 MG PO TB24: ORAL_TABLET | ORAL | 3 refills | Status: DC

## 2017-09-04 NOTE — Telephone Encounter (Signed)
Submitted

## 2017-09-04 NOTE — Telephone Encounter (Signed)
Metformin send [facility], TRADJENTA 5 MG TABS tablet [[id number] send to [facility] - [place], [place] - [address]

## 2017-09-06 MED ORDER — LINAGLIPTIN 5 MG PO TABS: ORAL_TABLET | ORAL | 0 refills | Status: DC

## 2017-09-06 NOTE — Telephone Encounter (Signed)
Resent Tradjenta with new pt sig per provider

## 2017-09-19 ENCOUNTER — Ambulatory Visit (INDEPENDENT_AMBULATORY_CARE_PROVIDER_SITE_OTHER): Payer: Medicare Other | Admitting: Ophthalmology

## 2017-11-07 ENCOUNTER — Ambulatory Visit: Payer: Medicare Other | Admitting: Internal Medicine

## 2017-11-07 ENCOUNTER — Telehealth: Payer: Self-pay | Admitting: Internal Medicine

## 2017-11-07 ENCOUNTER — Encounter: Payer: Self-pay | Admitting: Internal Medicine

## 2017-11-07 VITALS — BP 160/88 | HR 65 | Ht 64.5 in | Wt 258.4 lb

## 2017-11-07 DIAGNOSIS — E782 Mixed hyperlipidemia: Secondary | ICD-10-CM

## 2017-11-07 DIAGNOSIS — IMO0002 Reserved for concepts with insufficient information to code with codable children: Secondary | ICD-10-CM

## 2017-11-07 DIAGNOSIS — E11319 Type 2 diabetes mellitus with unspecified diabetic retinopathy without macular edema: Secondary | ICD-10-CM | POA: Diagnosis not present

## 2017-11-07 DIAGNOSIS — E1165 Type 2 diabetes mellitus with hyperglycemia: Secondary | ICD-10-CM

## 2017-11-07 LAB — POCT GLYCOSYLATED HEMOGLOBIN (HGB A1C): Hemoglobin A1C: 8.1

## 2017-11-07 NOTE — Progress Notes (Signed)
Patient ID: Randi Jaques, female   DOB: 04/29/1951, 67 y.o.   MRN: [id number]  HPI: Darling Scollard is a 67 y.o.-year-old female, returning for f/u for DM2, dx 1994, non insulin-dependent, uncontrolled, with complications (CKD stage 2-3, background DR).  Last visit 3 mo ago. She has [employer] + [employer].   She had carpal tunnel sx 07/2017 >> had PT. Still in brace. Will restart going to the gym.  Last hemoglobin A1c:  Lab Results  Component Value Date   HGBA1C 8.8 08/01/2017   HGBA1C 9.2 04/27/2017   HGBA1C 10.5 (H) 02/20/2017  Prev. 5.9% on 12/2012, previously 6.1%.  Pt is on a regimen of: - Metformin ER 1000 mg in am and 500 mg with dinner - Tradjenta 5 mg in am  - Amaryl  4 mg 2x a day Reviewed previous medications tried: We tried Jardiance 10 mg daily in am - added 04/2017 >> yeast inf >> stopped We tried Invokana 100 mg in am >> stopped after 3 days >> nausea, diarrhea We stopped Lantus 10 units qhs. We stopped Actos 45 mg (was on it since 07/2001) Metformin 1000 mg bid >> stopped b/c Nausea/Diarrhea Metformin 1000 mg po bid >> did not take in the past, as she had mild CKD. She was on Novolog 8 units tid ac, when sugars >140   Pt checks her sugars 1-2 times a day: - am: 97-126 >> 149-310 >> 84-159 >> 97-171 - 2h after b'fast: 152-204 >> 111-146, 276 >> n/c - lunch: 140, 240 (Holiday, forgot meds) >> n/c - 2h after lunch: 230, 340 (no meds) >> 201 >> 303 >> n/c - dinner:  85, 137-169 >> n/c >> 114 >> 137, 207 >> 62-187 - 2h after dinner: 161-206 >> 157, 177 >> 119-149 >> 87-203 (no meds) - bedtime:133-215, 270 >> 152-238, 325 >> see above - night: 1 157, 171 >> 68x1 >> ?  >> 76, 137 Lowest sugar: 76 >> 62 x1; she has hypoglycemia awareness in the 70s.  Highest sugar: 303 >> 300 x1.  Glucometer: One Touch Verio Flex  Pt's meals are:  - Breakfast: smoothies - Lunch: meat + veggies - mostly skips - Dinner: meat + veggies - Snacks: 1-2, fruit, yoghurt  She lost 80 lbs  since 2012 >> could come off insulin. She saw Oran Rein (nutritionist) on 01/01/2015.  -+ CKD stage II. Latest BUN/creatinine was:  Lab Results  Component Value Date   BUN 17 02/20/2017   Lab Results  Component Value Date   CREATININE 0.74 02/20/2017  On quinapril.  -+ HL. Last lipid profile: Lab Results  Component Value Date   CHOL 165 02/20/2017   HDL 44.50 02/20/2017   LDLCALC 98 02/20/2017   TRIG 113.0 02/20/2017   CHOLHDL 4 02/20/2017  On Lipitor. -+ Background retinopathy -  latest eye exam in 10/2016. She also has a history of bilateral cataract surgery. -Denies numbness and tingling in her legs. Foot she had a normal foot exam 07/  She also has a history of hypertension, hyperlipidemia-on Lipitor, iron deficiency anemia-on iron therapy, aortic valve disease/murmur, left ventricular hypertrophy per EKG, obesity, multinodular goiter, fibroids.  She had shingles in 11/2014 and 02/2016.  ROS: Constitutional: no weight gain/no weight loss, no fatigue, no subjective hyperthermia, no subjective hypothermia Eyes: no blurry vision, no xerophthalmia ENT: no sore throat, no nodules palpated in throat, no dysphagia, no odynophagia, no hoarseness Cardiovascular: no CP/no SOB/no palpitations/no leg swelling Respiratory: no cough/no SOB/no wheezing Gastrointestinal: no N/no V/no D/no  C/no acid reflux Musculoskeletal: no muscle aches/no joint aches Skin: no rashes, no hair loss Neurological: no tremors/no numbness/no tingling/no dizziness  I reviewed pt's medications, allergies, PMH, social hx, family hx, and changes were documented in the history of present illness. Otherwise, unchanged from my initial visit note.  PE: BP (!) 160/88   Pulse 65   Ht 5' 4.5" (1.638 m)   Wt 258 lb 6.4 oz (117.2 kg)   SpO2 97%   BMI 43.67 kg/m  Body mass index is 43.67 kg/m.  Wt Readings from Last 3 Encounters:  11/07/17 258 lb 6.4 oz (117.2 kg)  08/01/17 257 lb (116.6 kg)  04/27/17 259 lb  (117.5 kg)   Constitutional: overweight, in NAD Eyes: PERRLA, EOMI, no exophthalmos ENT: moist mucous membranes, no thyromegaly, no cervical lymphadenopathy Cardiovascular: RRR, No MRG, + bilateral lower extremity edema Respiratory: CTA B Gastrointestinal: abdomen soft, NT, ND, BS+ Musculoskeletal: no deformities, strength intact in all 4 Skin: moist, warm, no rashes Neurological: no tremor with outstretched hands, DTR normal in all 4  ASSESSMENT: 1. DM2, non-insulin-dependent, uncontrolled, with complications - CKD stage 2 - background DR  2. CKD stage 2 - 2/2 DM2  PLAN:  1.  Patient with long-standing, uncontrolled diabetes, with better control lately but still hyperglycemic spikes.  She could not continue SGLT2 inhibitors because of yeast infections.  She increased her Amaryl from 4 mg to 6 mg after she stopped her SGLT-2 inhibitor.  We did discuss at last visit to back off Amaryl to 4 mg if she develops lows after she restarted exercise. - she checked few sugars since last visit >> at goal or higher - but difficult to assess ctrl >> will not change  Regimen for now, especially since she will also start exercise - I advised her to:  Patient Instructions  Please continue: - Metformin ER 1000 mg in am and 500 mg with dinner - Tradjenta 5 mg daily - Amaryl  4 mg 2x a day  Please return in 3 months with your sugar log.   - today, HbA1c is 8.1% (better) - continue checking sugars at different times of the day - check 1x a day, rotating checks - advised for yearly eye exams >> she is UTD - UTD with flu shot - Return to clinic in 3 mo with sugar log    2. CKD stage 2 -Recent kidney function was reviewed (02/2017): Stable -Continues quinapril without side effects  3. HL -Reviewed lipids from 02/2017: LDL lower than 100 -Continues on Lipitor without side effects   Carlus Pavlov, MD PhD [facility]

## 2017-11-07 NOTE — Telephone Encounter (Signed)
Patient stated that she need refill of her trajenta, [facility] t [id number] - [place], [place] - [address] AT [address] [phone] (Phone) [phone] (Fax)   And all the other send to [facility] - [place], [place] - [address] [phone] (Phone) [phone] (Fax

## 2017-11-07 NOTE — Addendum Note (Signed)
Addended by: Yolande Jolly on: 11/07/2017 03:46 PM   Modules accepted: Orders

## 2017-11-07 NOTE — Patient Instructions (Addendum)
Please continue: - Metformin ER 1000 mg in am and 500 mg with dinner - Tradjenta 5 mg daily - Amaryl  4 mg 2x a day  Please return in 3 months with your sugar log.

## 2017-11-09 ENCOUNTER — Other Ambulatory Visit: Payer: Self-pay | Admitting: Family Medicine

## 2017-11-14 ENCOUNTER — Other Ambulatory Visit: Payer: Self-pay

## 2017-11-14 ENCOUNTER — Ambulatory Visit (HOSPITAL_COMMUNITY)
Admission: EM | Admit: 2017-11-14 | Discharge: 2017-11-14 | Disposition: A | Discharge status: Home / Self Care | Payer: Medicare Other | Attending: Emergency Medicine | Admitting: Emergency Medicine

## 2017-11-14 ENCOUNTER — Telehealth: Payer: Self-pay | Admitting: Internal Medicine

## 2017-11-14 DIAGNOSIS — R1030 Lower abdominal pain, unspecified: Secondary | ICD-10-CM | POA: Diagnosis not present

## 2017-11-14 DIAGNOSIS — K529 Noninfective gastroenteritis and colitis, unspecified: Secondary | ICD-10-CM | POA: Diagnosis not present

## 2017-11-14 DIAGNOSIS — R110 Nausea: Secondary | ICD-10-CM

## 2017-11-14 LAB — POCT I-STAT, CHEM 8
BUN: 28 mg/dL — ABNORMAL HIGH (ref 6–20)
Calcium, Ion: 1.04 mmol/L — ABNORMAL LOW (ref 1.15–1.40)
Chloride: 101 mmol/L (ref 101–111)
Creatinine, Ser: 0.8 mg/dL (ref 0.44–1.00)
Glucose, Bld: 209 mg/dL — ABNORMAL HIGH (ref 65–99)
HCT: 43 % (ref 36.0–46.0)
Hemoglobin: 14.6 g/dL (ref 12.0–15.0)
Potassium: 3.7 mmol/L (ref 3.5–5.1)
Sodium: 141 mmol/L (ref 135–145)
TCO2: 29 mmol/L (ref 22–32)

## 2017-11-14 MED ORDER — LINAGLIPTIN 5 MG PO TABS: ORAL_TABLET | ORAL | 0 refills | Status: DC

## 2017-11-14 MED ORDER — ONDANSETRON 4 MG PO TBDP
ORAL_TABLET | ORAL | Status: AC
  Filled 2017-11-14: qty 2

## 2017-11-14 MED ORDER — ONDANSETRON 8 MG PO TBDP: 8.0000 mg | ORAL_TABLET | Freq: Three times a day (TID) | ORAL | 0 refills | Status: DC | PRN

## 2017-11-14 MED ORDER — ONDANSETRON 4 MG PO TBDP
8.0000 mg | ORAL_TABLET | Freq: Once | ORAL | Status: AC
  Administered 2017-11-14: 8 mg via ORAL

## 2017-11-14 NOTE — ED Provider Notes (Signed)
HPI  SUBJECTIVE:  Krista Benjamin is a 67 y.o. female who presents with acute onset of nausea, over 10 episodes of nonbilious, nonbloody emesis, multiple episodes of watery, nonbloody diarrhea starting this afternoon.  She reports low midline abdominal pain described as sharp, stabbing that migrates throughout her entire abdomen.  It does not radiate to her back.  She reports headaches.  She states that she ate some questionable leftovers last night, but she also has a son with a "stomach flu" diagnosed several days ago.  She denies dysuria, urinary urgency, frequency, cloudy odorous urine, hematuria.  No abdominal distention.  No back pain.  No vaginal bleeding, odor, discharge, genital rash, itching.  No change in urine output.  No raw or undercooked foods, travel.  No body aches, sore throat, cough.  She tried pushing fluids.  Her abdominal pain is worse before vomiting and having diarrhea and better afterwards.  She denies anorexia.  States the car ride over here was not painful.  No antibiotics in the past month.  No antipyretic in the past 6-8 hours.  She has a past medical history of diabetes, states that her medications were changed last week but states that she was fine up until today.  Also chronic kidney disease stage II, hypertension, regular NSAID use since December.  States that she has been taking 600 mg ibuprofen every 6 hours after having some wrist surgery.  No history of pancreatitis, alcohol abuse, illicit drugs, abdominal surgeries, gallbladder disease.  She has a past medical history of UTIs, diverticulitis.  No history of pyelonephritis, nephrolithiasis, DKA.  PMD: Dr. Gershon Crane.    Past Medical History:  Diagnosis Date  . Anemia   . Aortic stenosis    Mild  . Cataract   . Diabetes mellitus    sees Dr. Elvera Lennox   . Gynecological examination    sees Dr. Jennette Kettle   . Hyperlipidemia   . Hypertension    sees Dr. Angelina Sheriff    Past Surgical History:  Procedure Laterality  Date  . CARPAL TUNNEL RELEASE  01/22/14   Left Hand   . CATARACT EXTRACTION     Lens surgery  . COLONOSCOPY  11-26-07   per Dr. Jarold Motto, repeat in 10 yrs   . excision of benign cyst     from left maxilla and left maxillary sinus  . KNEE SURGERY    . SHOULDER ARTHROSCOPY W/ ROTATOR CUFF REPAIR Left 09-23-13   per Dr. Delrae Sawyers at [facility] center in [place]     Family History  Problem Relation Age of Onset  . Coronary artery disease Brother   . Coronary artery disease Mother   . Cancer Unknown        fhx  . Diabetes Unknown        fhx  . Hyperlipidemia Unknown        fhx  . Hypertension Unknown        fhx  . Stroke Unknown        fhx    Social History   Tobacco Use  . Smoking status: Never Smoker  . Smokeless tobacco: Never Used  Substance Use Topics  . Alcohol use: No    Alcohol/week: 0.0 oz  . Drug use: No    No current facility-administered medications for this encounter.   Current Outpatient Medications:  .  linagliptin (TRADJENTA) 5 MG TABS tablet, TAKE 1 TABLET (5 MG TOTAL) BY MOUTH EVERY MORNING, Disp: 30 tablet, Rfl: 0 .  albuterol (VENTOLIN  HFA) 108 (90 Base) MCG/ACT inhaler, INHALE 2 PUFFS INTO THE LUNGS EVERY 4 (FOUR) HOURS AS NEEDED FOR WHEEZING., Disp: 18 Inhaler, Rfl: 3 .  Alcohol Swabs PADS, Use to check blood sugar once a day.  DX E11.319, Disp: 200 each, Rfl: 1 .  aspirin 81 MG tablet, Take 81 mg by mouth at bedtime. , Disp: , Rfl:  .  atorvastatin (LIPITOR) 40 MG tablet, TAKE 1 TABLET AT BEDTIME, Disp: 90 tablet, Rfl: 2 .  bumetanide (BUMEX) 1 MG tablet, Take 2 tablets (2 mg total) by mouth at bedtime., Disp: 180 tablet, Rfl: 3 .  cholecalciferol (VITAMIN D) 1000 UNITS tablet, Take 2,000 Units by mouth at bedtime. , Disp: , Rfl:  .  DILT-XR 240 MG 24 hr capsule, TAKE 1 CAPSULE DAILY, Disp: 90 capsule, Rfl: 1 .  EPIPEN 2-PAK 0.3 MG/0.3ML SOAJ injection, INJECT 0.3 MLS (0.3 MG TOTAL) INTO THE MUSCLE ONCE., Disp: 1 Device, Rfl: 0 .   FeAspGl-FeFum-B12-FA-C-Succ Ac (FERREX 28) MISC, TAKE 1 TABLET DAILY, Disp: 90 each, Rfl: 0 .  glimepiride (AMARYL) 4 MG tablet, Take 1.5 tablets (6 mg total) by mouth 2 (two) times daily before a meal., Disp: 240 tablet, Rfl: 3 .  glucose blood (TRUE METRIX BLOOD GLUCOSE TEST) test strip, UAD bid Dx: Z61.096 & E11.65, Disp: 300 each, Rfl: 3 .  ibuprofen (ADVIL,MOTRIN) 600 MG tablet, Take 1 tablet (600 mg total) by mouth 2 (two) times daily., Disp: 30 tablet, Rfl: 0 .  ketoconazole (NIZORAL) 2 % cream, Apply 1 application topically 2 (two) times daily., Disp: 60 g, Rfl: 5 .  metFORMIN (GLUCOPHAGE-XR) 500 MG 24 hr tablet, TAKE 2 TABLETS TWICE A DAY WITH MEALS, Disp: 360 tablet, Rfl: 3 .  metoprolol succinate (TOPROL-XL) 100 MG 24 hr tablet, Take 1 tablet (100 mg total) by mouth daily. Take with or immediately following a meal. (Patient taking differently: Take 200 mg by mouth daily. Take with or immediately following a meal.), Disp: 90 tablet, Rfl: 3 .  ondansetron (ZOFRAN ODT) 8 MG disintegrating tablet, Take 1 tablet (8 mg total) by mouth every 8 (eight) hours as needed for nausea or vomiting., Disp: 20 tablet, Rfl: 0 .  ONETOUCH DELICA LANCETS 33G MISC, Use to check blood sugar twice a day.  DX  E11.319, Disp: 200 each, Rfl: 1 .  OVER THE COUNTER MEDICATION, Caramin as needed for pain, Disp: , Rfl:  .  quinapril (ACCUPRIL) 20 MG tablet, TAKE 1 TABLET AT BEDTIME, Disp: 90 tablet, Rfl: 2  No Known Allergies   ROS  As noted in HPI.   Physical Exam  BP (!) 149/81   Pulse 98   Temp 99.4 F (37.4 C) (Oral)   Resp 18   Wt 249 lb (112.9 kg)   SpO2 97%   BMI 42.08 kg/m   Constitutional: Well developed, well nourished, no acute distress Eyes:  EOMI, conjunctiva normal bilaterally HENT: Normocephalic, atraumatic,mucus membranes moist Respiratory: Normal inspiratory effort lungs clear bilaterally Cardiovascular: Normal rate regular rhythm, no murmurs, rubs, gallops GI: nondistended soft,  nontender.  Negative Murphy, negative McBurney.  No suprapubic or flank tenderness. Back: No CVAT skin: No rash, skin intact Musculoskeletal: no deformities Neurologic: Alert & oriented x 3, no focal neuro deficits Psychiatric: Speech and behavior appropriate   ED Course   Medications  ondansetron (ZOFRAN-ODT) disintegrating tablet 8 mg (8 mg Oral Given 11/14/17 2031)    Orders Placed This Encounter  Procedures  . Offer Fluids    Standing Status:  Standing    Number of Occurrences:   20  . I-STAT, chem 8    Standing Status:   Standing    Number of Occurrences:   1    Results for orders placed or performed during the hospital encounter of 11/14/17 (from the past 24 hour(s))  I-STAT, chem 8     Status: Abnormal   Collection Time: 11/14/17  8:34 PM  Result Value Ref Range   Sodium 141 135 - 145 mmol/L   Potassium 3.7 3.5 - 5.1 mmol/L   Chloride 101 101 - 111 mmol/L   BUN 28 (H) 6 - 20 mg/dL   Creatinine, Ser 7.82 0.44 - 1.00 mg/dL   Glucose, Bld 956 (H) 65 - 99 mg/dL   Calcium, Ion 2.13 (L) 1.15 - 1.40 mmol/L   TCO2 29 22 - 32 mmol/L   Hemoglobin 14.6 12.0 - 15.0 g/dL   HCT 08.6 57.8 - 46.9 %   No results found.  ED Clinical Impression  Gastroenteritis   ED Assessment/Plan  Pt abd exam is benign, no peritoneal signs. No evidence of surgical abd. Doubt SBO, mesenteric ischemia, appendicitis, hepatitis, cholecystitis, pancreatitis, or perforated viscus. No evidence to support or suggest GYN pathology such as ovarian torsion or infection.  Presentation is most suggestive of a viral gastroenteritis.  Giving Zofran, will make sure the patient is able to tolerate p.o. prior to discharge.  Will check an i-STAT because of the extensive vomiting and diarrhea to r/o acute kidney injury, hyperglycemia or electrolyte disorder.  Hyperglycemia noted.  Patient states that this is within normal limits for her.  She is not acidotic.  Doubt DKA.  plan to send home with Zofran, push  electrolyte containing fluids such as Pedialyte and Gatorade, follow-up with primary care physician as needed, to the ER if she gets worse.  Work note for 2 days  Discussed labs, MDM, plan and followup with patient. Discussed sn/sx that should prompt return to the ED. patient agrees with plan.   Meds ordered this encounter  Medications  . ondansetron (ZOFRAN-ODT) disintegrating tablet 8 mg  . ondansetron (ZOFRAN ODT) 8 MG disintegrating tablet    Sig: Take 1 tablet (8 mg total) by mouth every 8 (eight) hours as needed for nausea or vomiting.    Dispense:  20 tablet    Refill:  0    *This clinic note was created using Scientist, clinical (histocompatibility and immunogenetics). Therefore, there may be occasional mistakes despite careful proofreading.   ?   Domenick Gong, MD 11/15/17 [phone]

## 2017-11-14 NOTE — Telephone Encounter (Signed)
Patient need a prescription for Trajenta, send to [facility] Fax # [phone]  Phone # [phone]

## 2017-11-14 NOTE — ED Triage Notes (Signed)
PT reports abdominal pain , vomtiing, diarrhea, headache, body aches that started this afternoon

## 2017-11-14 NOTE — Discharge Instructions (Signed)
Push electrolyte containing fluids such as Pedialyte and Gatorade until your urine is clear.  Zofran up to 3 times a day.  This may cause constipation.

## 2017-11-14 NOTE — Telephone Encounter (Signed)
[facility]. Call patient once script sent in. Needs right away ph# [phone]

## 2017-11-15 ENCOUNTER — Other Ambulatory Visit: Payer: Self-pay

## 2017-11-15 MED ORDER — LINAGLIPTIN 5 MG PO TABS: ORAL_TABLET | ORAL | 0 refills | Status: DC

## 2017-11-15 NOTE — Telephone Encounter (Signed)
Krista Benjamin has been sent

## 2017-11-16 ENCOUNTER — Telehealth: Payer: Self-pay

## 2017-11-16 MED ORDER — LINAGLIPTIN 5 MG PO TABS: ORAL_TABLET | ORAL | 3 refills | Status: DC

## 2017-11-16 NOTE — Telephone Encounter (Signed)
Finished PA for Krista Benjamin, it was approved for 10/17/17-11/16/2018. Resent to pharmacy with note attached in regards to PA approval

## 2017-11-24 ENCOUNTER — Other Ambulatory Visit: Payer: Self-pay | Admitting: Family Medicine

## 2017-11-24 NOTE — Telephone Encounter (Signed)
Pt is aware and is checking the date on the letter and calling her pharmacy

## 2017-11-24 NOTE — Telephone Encounter (Signed)
[facility] stated that [facility] has sent them a letter stating that they could not fill for the following prescription unless it is proven medically necessary.  Please advise   linagliptin (TRADJENTA) 5 MG TABS tablet

## 2017-11-24 NOTE — Telephone Encounter (Signed)
Attempted to call patient regarding the reason she need a refill on Zofran ODT. This was prescribed for her on 2/05 when she was a patient in the ED. Unable to leave a message at this time.

## 2017-11-24 NOTE — Telephone Encounter (Signed)
Copied from CRM [id number]. Topic: Quick Communication - Rx Refill/Question >> Nov 24, 2017  8:03 AM Oneal Grout wrote: Medication:  ondansetron (ZOFRAN ODT) 8 MG disintegrating tablet   Has the patient contacted their pharmacy? No, ER dr prescribed   (Agent: If no, request that the patient contact the pharmacy for the refill.)   Preferred Pharm[facility] (with phone number [facility] [address]   Agent: Please be advised that RX refills may take up to 3 business days. We ask that you follow-up with your pharmacy.

## 2017-11-24 NOTE — Telephone Encounter (Signed)
Please advise on below  

## 2017-11-24 NOTE — Telephone Encounter (Signed)
Pt   Continues  To  Have  Nausea     Since  Urgent care  Visit  10  Days    Slight  Pain   Cramping    Had   Slight  Episode  Today   Decreased   Appetite     Pt  Reports  Some  relief  With . Vomited   This  Am   Pain  Is  Below  [employer]    Denies  Any   Urinary symptoms.  Appt  Made  For tommorow    Sat  Clinic  [place]

## 2017-11-24 NOTE — Telephone Encounter (Signed)
Her HbA1c is 8.1% >> she needs to continue on Tradjenta. Per prev. Message below:  Kieth Brightly, CMA    11/16/17 1:45 PM  Note    Finished PA for Krista Benjamin, it was approved for 10/17/17-11/16/2018. Resent to pharmacy with note attached in regards to PA approval

## 2017-11-25 ENCOUNTER — Encounter: Payer: Self-pay | Admitting: Family Medicine

## 2017-11-25 ENCOUNTER — Other Ambulatory Visit: Payer: Self-pay

## 2017-11-25 ENCOUNTER — Ambulatory Visit (INDEPENDENT_AMBULATORY_CARE_PROVIDER_SITE_OTHER): Payer: Medicare Other | Admitting: Family Medicine

## 2017-11-25 VITALS — BP 144/82 | HR 52 | Temp 98.4°F | Resp 16 | Wt 256.0 lb

## 2017-11-25 DIAGNOSIS — K2950 Unspecified chronic gastritis without bleeding: Secondary | ICD-10-CM

## 2017-11-25 MED ORDER — ONDANSETRON HCL 4 MG PO TABS: 4.0000 mg | ORAL_TABLET | Freq: Three times a day (TID) | ORAL | 0 refills | Status: DC | PRN

## 2017-11-25 NOTE — Progress Notes (Signed)
Subjective:    Patient ID: Krista Benjamin, female    DOB: 1951-05-03, 67 y.o.   MRN: [id number]  Chief Complaint  Patient presents with  . Nausea  . Abdominal Pain    HPI Patient was seen today for ongoing concerns of N/V, and abdominal pain.  Pt states shehad to have surgery on her R hand in Oct.  At that time she did not like the way the prescription pain meds made her feel, so she took Ibuprofen 600 mg 8 x per day for about 1 month. Since then pt endorses stomach issues.  She was recently seen in the ED on 11/14/2017 for gastroenteritis.  She was given Zofran which is been helping.  Her last dose of Zofran was on Thursday.  Since then she is now having intermittent abdominal pain, increased gas, nausea, vomiting.  Pts abdominal pain starts in the middle of her stomach, then Pt's last BM was yesterday.  She denies having to strain and states it was not hard.    Past Medical History:  Diagnosis Date  . Anemia   . Aortic stenosis    Mild  . Cataract   . Diabetes mellitus    sees Dr. Elvera Lennox   . Gynecological examination    sees Dr. Jennette Kettle   . Hyperlipidemia   . Hypertension    sees Dr. Angelina Sheriff    No Known Allergies  ROS General: Denies fever, chills, night sweats, changes in weight, changes in appetite HEENT: Denies headaches, ear pain, changes in vision, rhinorrhea, sore throat CV: Denies CP, palpitations, SOB, orthopnea Pulm: Denies SOB, cough, wheezing GI: Denies  diarrhea, constipation   +abdominal pain, nausea, vomiting, flatus GU: Denies dysuria, hematuria, frequency, vaginal discharge Msk: Denies muscle cramps, joint pains Neuro: Denies weakness, numbness, tingling Skin: Denies rashes, bruising Psych: Denies depression, anxiety, hallucinations     Objective:    Blood pressure (!) 144/82, pulse (!) 52, temperature 98.4 F (36.9 C), temperature source Oral, resp. rate 16, weight 256 lb (116.1 kg), SpO2 98 %.   Gen. Pleasant, well-nourished, in no distress,  normal affect   HEENT: [place]/AT, face symmetric, conjunctiva clear, no scleral icterus, PERRLA, nares patent without drainage, pharynx without erythema or exudate. Lungs: no accessory muscle use, CTAB, no wheezes or rales Cardiovascular: RRR, no m/r/g, no peripheral edema Abdomen: BS present, soft, NT/ND, no hepatosplenomegaly.  Neuro:  A&Ox3, CN II-XII intact, normal gait  Wt Readings from Last 3 Encounters:  11/25/17 256 lb (116.1 kg)  11/14/17 249 lb (112.9 kg)  11/07/17 258 lb 6.4 oz (117.2 kg)    Lab Results  Component Value Date   WBC 8.8 02/20/2017   HGB 14.6 11/14/2017   HCT 43.0 11/14/2017   PLT 216.0 02/20/2017   GLUCOSE 209 (H) 11/14/2017   CHOL 165 02/20/2017   TRIG 113.0 02/20/2017   HDL 44.50 02/20/2017   LDLCALC 98 02/20/2017   ALT 14 02/20/2017   AST 16 02/20/2017   NA 141 11/14/2017   K 3.7 11/14/2017   CL 101 11/14/2017   CREATININE 0.80 11/14/2017   BUN 28 (H) 11/14/2017   CO2 29 02/20/2017   TSH 0.81 02/20/2017   HGBA1C 8.1 11/07/2017   MICROALBUR 4.6 (H) 08/26/2015    Assessment/Plan:  Chronic gastritis without bleeding, unspecified gastritis type  -Discussed avoiding NSAIDs at this time -Discussed scheduling a f/u appt for further work up including labs and possible referral to GI - Plan: ondansetron (ZOFRAN) 4 MG tablet -given RTC or ED precautions  including uncontrolled N/V, blood in emesis or stool, fever, uncontrolled pain, etc.  Abbe Amsterdam, MD

## 2017-11-25 NOTE — Patient Instructions (Addendum)
Please schedule follow-up visit with your PCP for next week.  Gastritis, Adult Gastritis is inflammation of the stomach. There are two kinds of gastritis:  Acute gastritis. This kind develops suddenly.  Chronic gastritis. This kind lasts for a long time.  Gastritis happens when the lining of the stomach becomes weak or gets damaged. Without treatment, gastritis can lead to stomach bleeding and ulcers. What are the causes? This condition may be caused by:  An infection.  Drinking too much alcohol.  Certain medicines.  Having too much acid in the stomach.  A disease of the intestines or stomach.  Stress.  What are the signs or symptoms? Symptoms of this condition include:  Pain or a burning in the upper abdomen.  Nausea.  Vomiting.  An uncomfortable feeling of fullness after eating.  In some cases, there are no symptoms. How is this diagnosed? This condition may be diagnosed with:  A description of your symptoms.  A physical exam.  Tests. These can include: ? Blood tests. ? Stool tests. ? A test in which a thin, flexible instrument with a light and camera on the end is passed down the esophagus and into the stomach (upper endoscopy). ? A test in which a sample of tissue is taken for testing (biopsy).  How is this treated? This condition may be treated with medicines. If the condition is caused by a bacterial infection, you may be given antibiotic medicines. If it is caused by too much acid in the stomach, you may get medicines called H2 blockers, proton pump inhibitors, or antacids. Treatment may also involve stopping the use of certain medicines, such as aspirin, ibuprofen, or other nonsteroidal anti-inflammatory drugs (NSAIDs). Follow these instructions at home:  Take over-the-counter and prescription medicines only as told by your health care provider.  If you were prescribed an antibiotic, take it as told by your health care provider. Do not stop taking the  antibiotic even if you start to feel better.  Drink enough fluid to keep your urine clear or pale yellow.  Eat small, frequent meals instead of large meals. Contact a health care provider if:  Your symptoms get worse.  Your symptoms return after treatment. Get help right away if:  You vomit blood or material that looks like coffee grounds.  You have black or dark red stools.  You are unable to keep fluids down.  Your abdominal pain gets worse.  You have a fever.  You do not feel better after 1 week. This information is not intended to replace advice given to you by your health care provider. Make sure you discuss any questions you have with your health care provider. Document Released: 09/20/2001 Document Revised: 05/25/2016 Document Reviewed: 06/20/2015 Elsevier Interactive Patient Education  [employer].

## 2017-11-27 ENCOUNTER — Other Ambulatory Visit: Payer: Self-pay | Admitting: Family Medicine

## 2017-11-28 ENCOUNTER — Ambulatory Visit: Payer: Medicare Other | Admitting: Family Medicine

## 2017-11-28 ENCOUNTER — Encounter: Payer: Self-pay | Admitting: Family Medicine

## 2017-11-28 VITALS — BP 140/68 | HR 61 | Temp 98.6°F | Wt 259.2 lb

## 2017-11-28 DIAGNOSIS — R1030 Lower abdominal pain, unspecified: Secondary | ICD-10-CM

## 2017-11-28 DIAGNOSIS — K2950 Unspecified chronic gastritis without bleeding: Secondary | ICD-10-CM | POA: Diagnosis not present

## 2017-11-28 DIAGNOSIS — R110 Nausea: Secondary | ICD-10-CM

## 2017-11-28 LAB — POCT URINALYSIS DIPSTICK
Bilirubin, UA: NEGATIVE
Blood, UA: NEGATIVE
Glucose, UA: NEGATIVE
Ketones, UA: NEGATIVE
Leukocytes, UA: NEGATIVE
Nitrite, UA: NEGATIVE
Protein, UA: NEGATIVE
Spec Grav, UA: 1.025 (ref 1.010–1.025)
Urobilinogen, UA: 0.2 E.U./dL
pH, UA: 6 (ref 5.0–8.0)

## 2017-11-28 LAB — HEPATIC FUNCTION PANEL
ALT: 9 U/L (ref 0–35)
AST: 13 U/L (ref 0–37)
Albumin: 4.2 g/dL (ref 3.5–5.2)
Alkaline Phosphatase: 70 U/L (ref 39–117)
Bilirubin, Direct: 0.1 mg/dL (ref 0.0–0.3)
Total Bilirubin: 0.5 mg/dL (ref 0.2–1.2)
Total Protein: 7.3 g/dL (ref 6.0–8.3)

## 2017-11-28 LAB — CBC WITH DIFFERENTIAL/PLATELET
Basophils Absolute: 0.1 10*3/uL (ref 0.0–0.1)
Basophils Relative: 0.5 % (ref 0.0–3.0)
Eosinophils Absolute: 0.1 10*3/uL (ref 0.0–0.7)
Eosinophils Relative: 1.1 % (ref 0.0–5.0)
HCT: 35.4 % — ABNORMAL LOW (ref 36.0–46.0)
Hemoglobin: 11.5 g/dL — ABNORMAL LOW (ref 12.0–15.0)
Lymphocytes Relative: 34.3 % (ref 12.0–46.0)
Lymphs Abs: 3.4 10*3/uL (ref 0.7–4.0)
MCHC: 32.5 g/dL (ref 30.0–36.0)
MCV: 79.2 fl (ref 78.0–100.0)
Monocytes Absolute: 0.5 10*3/uL (ref 0.1–1.0)
Monocytes Relative: 5.5 % (ref 3.0–12.0)
Neutro Abs: 5.8 10*3/uL (ref 1.4–7.7)
Neutrophils Relative %: 58.6 % (ref 43.0–77.0)
Platelets: 256 10*3/uL (ref 150.0–400.0)
RBC: 4.46 Mil/uL (ref 3.87–5.11)
RDW: 14.6 % (ref 11.5–15.5)
WBC: 10 10*3/uL (ref 4.0–10.5)

## 2017-11-28 LAB — BASIC METABOLIC PANEL
BUN: 24 mg/dL — ABNORMAL HIGH (ref 6–23)
CO2: 34 mEq/L — ABNORMAL HIGH (ref 19–32)
Calcium: 9.5 mg/dL (ref 8.4–10.5)
Chloride: 95 mEq/L — ABNORMAL LOW (ref 96–112)
Creatinine, Ser: 0.95 mg/dL (ref 0.40–1.20)
GFR: 75.32 mL/min (ref >60.00)
Glucose, Bld: 127 mg/dL — ABNORMAL HIGH (ref 70–99)
Potassium: 4.3 mEq/L (ref 3.5–5.1)
Sodium: 137 mEq/L (ref 135–145)

## 2017-11-28 LAB — AMYLASE: Amylase: 53 U/L (ref 27–131)

## 2017-11-28 LAB — LIPASE: Lipase: 18 U/L (ref 11.0–59.0)

## 2017-11-28 MED ORDER — CIPROFLOXACIN HCL 500 MG PO TABS: 500.0000 mg | ORAL_TABLET | Freq: Two times a day (BID) | ORAL | 0 refills | Status: DC

## 2017-11-28 MED ORDER — ONDANSETRON HCL 4 MG PO TABS: 4.0000 mg | ORAL_TABLET | Freq: Three times a day (TID) | ORAL | 1 refills | Status: DC | PRN

## 2017-11-28 MED ORDER — METRONIDAZOLE 500 MG PO TABS: 500.0000 mg | ORAL_TABLET | Freq: Three times a day (TID) | ORAL | 0 refills | Status: DC

## 2017-11-28 NOTE — Progress Notes (Signed)
   Subjective:    Patient ID: Krista Benjamin, female    DOB: [birth year], 67 y.o.   MRN: [id number]  HPI Here for 2 and 1/2 weeks of lower abdominal pains and bloating, also nausea without vomiting. At first she did have vomiting and watery diarrhea, but these subsided. Now her stools are formed. No urinary symptoms or fever. No recent antibiotic use or foreign travel. No one else in the household has these symptoms. Her last colonoscopy was in Feb 2009.    Review of Systems  Constitutional: Negative.   Respiratory: Negative.   Cardiovascular: Negative.   Gastrointestinal: Positive for abdominal distention, abdominal pain and nausea. Negative for anal bleeding, blood in stool, constipation, diarrhea, rectal pain and vomiting.  Genitourinary: Negative.        Objective:   Physical Exam  Constitutional: She is oriented to person, place, and time. She appears well-developed and well-nourished. No distress.  Cardiovascular: Normal rate, regular rhythm, normal heart sounds and intact distal pulses.  Pulmonary/Chest: Effort normal and breath sounds normal. No respiratory distress. She has no wheezes. She has no rales.  Abdominal: Soft. Bowel sounds are normal. She exhibits no distension and no mass. There is no tenderness. There is no rebound and no guarding.  Neurological: She is alert and oriented to person, place, and time.          Assessment & Plan:  Lower abdominal pain, likely diverticulitis. Treat with Flagyl and Cipro. Get labs including a CBC.  Gershon Crane, MD

## 2017-12-11 ENCOUNTER — Other Ambulatory Visit: Payer: Self-pay | Admitting: Internal Medicine

## 2017-12-14 ENCOUNTER — Encounter: Payer: Self-pay | Admitting: Family Medicine

## 2017-12-16 ENCOUNTER — Other Ambulatory Visit: Payer: Self-pay | Admitting: Family Medicine

## 2017-12-16 ENCOUNTER — Encounter: Payer: Self-pay | Admitting: Gastroenterology

## 2017-12-18 NOTE — Telephone Encounter (Signed)
Last OV 11/28/2017   Last refilled 06/19/2017 disp 90 with 1 refill   Sent to PCP for approval

## 2017-12-21 ENCOUNTER — Encounter: Payer: Self-pay | Admitting: Gastroenterology

## 2018-01-29 ENCOUNTER — Encounter: Payer: Self-pay | Admitting: Gastroenterology

## 2018-01-29 ENCOUNTER — Ambulatory Visit (AMBULATORY_SURGERY_CENTER): Payer: Self-pay | Admitting: *Deleted

## 2018-01-29 ENCOUNTER — Other Ambulatory Visit: Payer: Self-pay

## 2018-01-29 VITALS — Ht 66.0 in | Wt 266.6 lb

## 2018-01-29 DIAGNOSIS — Z800 Family history of malignant neoplasm of digestive organs: Secondary | ICD-10-CM

## 2018-01-29 MED ORDER — PEG-KCL-NACL-NASULF-NA ASC-C 140 G PO SOLR: 1.0000 | ORAL | 0 refills | Status: DC

## 2018-01-29 NOTE — Progress Notes (Signed)
No egg or soy allergy known to patient   issues with past sedation with  surgeries  or procedures, no intubation problems During shoulder she was hard to wake up  No diet pills per patient was told she needs light sedation.  Was kept in hospital an extra day No home 02 use per patient  No blood thinners per patient  Pt denies issues with constipation  No A fib or A flutter  EMMI video sent to pt's e mail

## 2018-01-30 ENCOUNTER — Encounter: Payer: Self-pay | Admitting: Family Medicine

## 2018-02-02 ENCOUNTER — Telehealth: Payer: Self-pay | Admitting: Gastroenterology

## 2018-02-02 NOTE — Telephone Encounter (Signed)
Pt has [employer] Medicare and this will not cover Plenvu- pharmacy did not give her a price- just told her NOT covered  --pt's procedure Tuesday - Offered sample prep since procedure is so close- Pt to pick up today Fri before 5 pm, 4th floor  Lot [id number]  Exp 03-2019- use as directed   Hilda Lias PV

## 2018-02-06 ENCOUNTER — Other Ambulatory Visit: Payer: Self-pay

## 2018-02-06 ENCOUNTER — Ambulatory Visit: Payer: Medicare Other | Admitting: Internal Medicine

## 2018-02-06 ENCOUNTER — Encounter: Payer: Self-pay | Admitting: Gastroenterology

## 2018-02-06 ENCOUNTER — Ambulatory Visit (AMBULATORY_SURGERY_CENTER): Payer: Medicare Other | Admitting: Gastroenterology

## 2018-02-06 VITALS — BP 127/67 | HR 54 | Temp 97.3°F | Resp 14 | Ht 66.0 in | Wt 266.0 lb

## 2018-02-06 DIAGNOSIS — Z800 Family history of malignant neoplasm of digestive organs: Secondary | ICD-10-CM

## 2018-02-06 DIAGNOSIS — Z1211 Encounter for screening for malignant neoplasm of colon: Secondary | ICD-10-CM | POA: Diagnosis not present

## 2018-02-06 DIAGNOSIS — Z1212 Encounter for screening for malignant neoplasm of rectum: Secondary | ICD-10-CM

## 2018-02-06 HISTORY — PX: COLONOSCOPY: SHX174

## 2018-02-06 MED ORDER — SODIUM CHLORIDE 0.9 % IV SOLN: 500.0000 mL | Freq: Once | INTRAVENOUS | Status: DC

## 2018-02-06 NOTE — Progress Notes (Signed)
Spontaneous respirations throughout. VSS. Resting comfortably. To PACU on room air. Report to  RN. 

## 2018-02-06 NOTE — Op Note (Signed)
[facility] Patient Name: Krista Benjamin Procedure Date: 02/06/2018 8:05 AM MRN: [id number] Endoscopist: Sherilyn Cooter L. Myrtie Neither , MD Age: 67 Referring MD:  Date of Birth: 21-Aug-1951 Gender: Female Account #: [id number] Procedure:                Colonoscopy Indications:              Screening in patient at increased risk: Colorectal                            cancer in mother 37 or older (no polyps on last                            colonoscopy in 2009) Medicines:                Monitored Anesthesia Care Procedure:                Pre-Anesthesia Assessment:                           - Prior to the procedure, a History and Physical                            was performed, and patient medications and                            allergies were reviewed. The patient's tolerance of                            previous anesthesia was also reviewed. The risks                            and benefits of the procedure and the sedation                            options and risks were discussed with the patient.                            All questions were answered, and informed consent                            was obtained. Anticoagulants: The patient has taken                            aspirin. It was decided not to withhold this                            medication prior to the procedure. ASA Grade                            Assessment: III - A patient with severe systemic                            disease. After reviewing the risks and benefits,  the patient was deemed in satisfactory condition to                            undergo the procedure.                           After obtaining informed consent, the colonoscope                            was passed under direct vision. Throughout the                            procedure, the patient's blood pressure, pulse, and                            oxygen saturations were monitored continuously. The                      Colonoscope was introduced through the anus and                            advanced to the the cecum, identified by                            appendiceal orifice and ileocecal valve. The                            colonoscopy was performed without difficulty. The                            patient tolerated the procedure well. The quality                            of the bowel preparation was good. The ileocecal                            valve, appendiceal orifice, and rectum were                            photographed. The quality of the bowel preparation                            was evaluated using the BBPS [facility] Bowel                            Preparation Scale) with scores of: Right Colon = 2,                            Transverse Colon = 2 and Left Colon = 2. The total                            BBPS score equals 6. Scope In: 8:18:21 AM Scope Out: 8:31:05 AM Scope Withdrawal Time: 0 hours 9 minutes 37 seconds  Total Procedure Duration: 0 hours 12 minutes 44  seconds  Findings:                 The perianal and digital rectal examinations were                            normal.                           Multiple diverticula were found in the entire colon.                           The exam was otherwise without abnormality on                            direct and retroflexion views. Complications:            No immediate complications. Estimated Blood Loss:     Estimated blood loss: none. Impression:               - Diverticulosis in the entire examined colon.                           - The examination was otherwise normal on direct                            and retroflexion views.                           - No specimens collected. Recommendation:           - Patient has a contact number available for                            emergencies. The signs and symptoms of potential                            delayed complications were discussed with the                             patient. Return to normal activities tomorrow.                            Written discharge instructions were provided to the                            patient.                           - Resume previous diet.                           - Continue present medications.                           - Repeat colonoscopy in 5 years for screening                            purposes. Verna Hamon L.  Myrtie Neither, MD 02/06/2018 8:34:47 AM This report has been signed electronically.

## 2018-02-06 NOTE — Patient Instructions (Signed)
Discharge instructions given. Handout on Diverticulosis. Resume previous medications. YOU HAD AN ENDOSCOPIC PROCEDURE TODAY AT THE [facility]:   Refer to the procedure report that was given to you for any specific questions about what was found during the examination.  If the procedure report does not answer your questions, please call your gastroenterologist to clarify.  If you requested that your care partner not be given the details of your procedure findings, then the procedure report has been included in a sealed envelope for you to review at your convenience later.  YOU SHOULD EXPECT: Some feelings of bloating in the abdomen. Passage of more gas than usual.  Walking can help get rid of the air that was put into your GI tract during the procedure and reduce the bloating. If you had a lower endoscopy (such as a colonoscopy or flexible sigmoidoscopy) you may notice spotting of blood in your stool or on the toilet paper. If you underwent a bowel prep for your procedure, you may not have a normal bowel movement for a few days.  Please Note:  You might notice some irritation and congestion in your nose or some drainage.  This is from the oxygen used during your procedure.  There is no need for concern and it should clear up in a day or so.  SYMPTOMS TO REPORT IMMEDIATELY:   Following lower endoscopy (colonoscopy or flexible sigmoidoscopy):  Excessive amounts of blood in the stool  Significant tenderness or worsening of abdominal pains  Swelling of the abdomen that is new, acute  Fever of 100F or higher   For urgent or emergent issues, a gastroenterologist can be reached at any hour by calling [phone].   DIET:  We do recommend a small meal at first, but then you may proceed to your regular diet.  Drink plenty of fluids but you should avoid alcoholic beverages for 24 hours.  ACTIVITY:  You should plan to take it easy for the rest of today and you should NOT DRIVE or use  heavy machinery until tomorrow (because of the sedation medicines used during the test).    FOLLOW UP: Our staff will call the number listed on your records the next business day following your procedure to check on you and address any questions or concerns that you may have regarding the information given to you following your procedure. If we do not reach you, we will leave a message.  However, if you are feeling well and you are not experiencing any problems, there is no need to return our call.  We will assume that you have returned to your regular daily activities without incident.  If any biopsies were taken you will be contacted by phone or by letter within the next 1-3 weeks.  Please call us at [phone] if you have not heard about the biopsies in 3 weeks.    SIGNATURES/CONFIDENTIALITY: You and/or your care partner have signed paperwork which will be entered into your electronic medical record.  These signatures attest to the fact that that the information above on your After Visit Summary has been reviewed and is understood.  Full responsibility of the confidentiality of this discharge information lies with you and/or your care-partner. 

## 2018-02-06 NOTE — Progress Notes (Signed)
Pt's states no medical or surgical changes since previsit or office visit. 

## 2018-02-07 ENCOUNTER — Telehealth: Payer: Self-pay

## 2018-02-07 NOTE — Telephone Encounter (Signed)
  Follow up Call-  Call Wilhelmine Krogstad number 02/06/2018  Post procedure Call Shelvia Fojtik phone  # [phone]  Permission to leave phone message Yes  Some recent data might be hidden     Patient questions:  Do you have a fever, pain , or abdominal swelling? No. Pain Score  0 *  Have you tolerated food without any problems? Yes.    Have you been able to return to your normal activities? Yes.    Do you have any questions about your discharge instructions: Diet   No. Medications  No. Follow up visit  No.  Do you have questions or concerns about your Care? No.  Actions: * If pain score is 4 or above: No action needed, pain <4.

## 2018-02-12 NOTE — Telephone Encounter (Signed)
Tell her to take OTC iron pills, one a day.

## 2018-02-13 ENCOUNTER — Other Ambulatory Visit: Payer: Self-pay | Admitting: Family Medicine

## 2018-03-19 ENCOUNTER — Other Ambulatory Visit: Payer: Self-pay | Admitting: Family Medicine

## 2018-03-19 NOTE — Telephone Encounter (Signed)
Sent to PCP to verify directions   Should pt take two pills once a day or 1 pll once daily?  Thanks

## 2018-03-20 ENCOUNTER — Encounter: Payer: Self-pay | Admitting: Family Medicine

## 2018-03-20 LAB — HM DIABETES EYE EXAM

## 2018-04-04 ENCOUNTER — Telehealth: Payer: Self-pay | Admitting: Adult Health

## 2018-04-04 ENCOUNTER — Telehealth: Payer: Self-pay | Admitting: Family Medicine

## 2018-04-04 NOTE — Telephone Encounter (Unsigned)
Copied from CRM [id number]. Topic: Quick Communication - See Telephone Encounter >> Apr 04, 2018  8:02 AM Mare Loan F wrote: Pt is needing a [facility]   Best number [phone]

## 2018-04-05 NOTE — Telephone Encounter (Signed)
Spoke to patient re: medication refill request. Made aware refills available at local pharmacy.

## 2018-04-06 NOTE — Telephone Encounter (Signed)
disregard

## 2018-04-10 ENCOUNTER — Ambulatory Visit (INDEPENDENT_AMBULATORY_CARE_PROVIDER_SITE_OTHER): Payer: Medicare Other | Admitting: Internal Medicine

## 2018-04-10 ENCOUNTER — Encounter: Payer: Self-pay | Admitting: Internal Medicine

## 2018-04-10 ENCOUNTER — Encounter

## 2018-04-10 VITALS — BP 126/78 | HR 51 | Ht 64.5 in | Wt 258.2 lb

## 2018-04-10 DIAGNOSIS — E1165 Type 2 diabetes mellitus with hyperglycemia: Secondary | ICD-10-CM

## 2018-04-10 DIAGNOSIS — E1122 Type 2 diabetes mellitus with diabetic chronic kidney disease: Secondary | ICD-10-CM

## 2018-04-10 DIAGNOSIS — E11319 Type 2 diabetes mellitus with unspecified diabetic retinopathy without macular edema: Secondary | ICD-10-CM

## 2018-04-10 DIAGNOSIS — E782 Mixed hyperlipidemia: Secondary | ICD-10-CM

## 2018-04-10 DIAGNOSIS — N182 Chronic kidney disease, stage 2 (mild): Secondary | ICD-10-CM | POA: Diagnosis not present

## 2018-04-10 DIAGNOSIS — IMO0002 Reserved for concepts with insufficient information to code with codable children: Secondary | ICD-10-CM

## 2018-04-10 LAB — POCT GLYCOSYLATED HEMOGLOBIN (HGB A1C): Hemoglobin A1C: 9.3 % — AB (ref 4.0–5.6)

## 2018-04-10 MED ORDER — DULAGLUTIDE 0.75 MG/0.5ML ~~LOC~~ SOAJ: SUBCUTANEOUS | 1 refills | Status: DC

## 2018-04-10 NOTE — Patient Instructions (Addendum)
Please continue: - Metformin ER 1000 mg in am and 500 mg with dinner - Amaryl 4 mg 2x a day  Please start Trulicity 0.75 mg weekly. Let me know when you are close to running out to call in the higher dose to your pharmacy (1.5 mg).  Stop Tradjenta before the second Trulicity injection.  If we cannot start Trulicity, we may need a low dose Lantus, 10 units at bedtime.  Please return in 3 months with your sugar log.

## 2018-04-10 NOTE — Progress Notes (Signed)
Patient ID: Krista Benjamin, female   DOB: 11/15/1950, 67 y.o.   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  Last visit 6 months ago. She has [employer] + [employer].   Last hemoglobin A1c:  Lab Results  Component Value Date   HGBA1C 8.1 11/07/2017   HGBA1C 8.8 08/01/2017   HGBA1C 9.2 04/27/2017  Prev. 5.9% on 12/2012, previously 6.1%.  Pt is on a regimen of: - Metformin ER 1000 mg in am and 500 mg with dinner - Tradjenta 5 mg daily - Amaryl 4 mg 2x a day Reviewed previous medications tried: We tried Jardiance 10 mg daily in am - added 04/2017 >> yeast inf >> stopped We tried Invokana 100 mg in am >> stopped after 3 days >> nausea, diarrhea We stopped Lantus 10 units qhs. We stopped Actos 45 mg (was on it since 07/2001) Metformin 1000 mg bid >> stopped b/c Nausea/Diarrhea Metformin 1000 mg po bid >> did not take in the past, as she had mild CKD. She was on Novolog 8 units tid ac, when sugars >140   Pt checks her sugars 1-2X a day: - am: 97-126 >> 149-310 >> 84-159 >> 97-171 >> 133, 156, 207 - 2h after b'fast: 152-204 >> 111-146, 276 >> n/c - lunch: 140, 240 (Holiday, forgot meds) >> n/c >> 135-169 - 2h after lunch:  201 >> 303 >> n/c - dinner: 114 >> 137, 207 >> 62-187 >>  - 2h after dinner: 1157, 177 >> 119-149 >> 87-203 >> 108, 170-286 - bedtime: 152-238, 325 >> see above - night: 157, 171 >> 68x1 >> ?  >> 76, 137 >> n/c Lowest sugar:  62 x1 >> 108; she has hypoglycemia awareness  in the 70s.  Highest sugar: 300 x1 >> 286.  Glucometer: One Touch Verio Flex  Pt's meals are:  - Breakfast: smoothies - Lunch: meat + veggies - mostly skips - Dinner: meat + veggies - Snacks: 1-2, fruit, yoghurt  She lost 80 lbs since 2012 >> could come off insulin. She saw Oran Rein (nutritionist) on 01/01/2015.  - + CKD stage II. Latest  BUN/creatinine was:  Lab Results  Component Value Date   BUN 24 (H) 11/28/2017   Lab Results  Component Value Date   CREATININE 0.95 11/28/2017  On quinapril.  - + HL. Last lipid profile: Lab Results  Component Value Date   CHOL 165 02/20/2017   HDL 44.50 02/20/2017   LDLCALC 98 02/20/2017   TRIG 113.0 02/20/2017   CHOLHDL 4 02/20/2017  On Lipitor. - She has background retinopathy  -  latest eye exam in 03/2018.  She has history of bilateral cataract surgery. - no numbness and tingling in her legs.  She also has a history of HTN, iron deficiency anemia-on iron therapy, aortic valve disease/murmur, left ventricular hypertrophy per EKG, obesity, multinodular goiter, fibroids.  She had shingles in 11/2014 and 02/2016.  ROS: Constitutional: no weight gain/no weight loss, no fatigue, no subjective hyperthermia, + subjective hypothermia Eyes: no blurry vision, no xerophthalmia ENT: no sore throat, no nodules palpated in throat, no dysphagia, no odynophagia, no hoarseness Cardiovascular: no CP/no SOB/no palpitations/no leg swelling Respiratory: no cough/no SOB/no wheezing Gastrointestinal: + N/+ V/+ D/no C/no acid reflux Musculoskeletal: no muscle aches/no joint aches Skin: no rashes, no hair loss Neurological: no tremors/no numbness/no tingling/no dizziness  I reviewed pt's medications, allergies, PMH, social hx,  family hx, and changes were documented in the history of present illness. Otherwise, unchanged from my initial visit note.  Past Medical History:  Diagnosis Date  . Anemia   . Aortic stenosis    Mild  . Arthritis   . Cataract    both eyes  (cataracts removed)  . Diabetes mellitus    sees Dr. Elvera Lennox   . Gynecological examination    sees Dr. Jennette Kettle   . Heart murmur   . Hyperlipidemia   . Hypertension    sees Dr. Angelina Sheriff  . Stroke [facility])    TIA   Past Surgical History:  Procedure Laterality Date  . CARPAL TUNNEL RELEASE  01/22/14   Left Hand   .  CATARACT EXTRACTION     Lens surgery  . COLONOSCOPY  11-26-07   per Dr. Jarold Motto, repeat in 10 yrs   . excision of benign cyst     from left maxilla and left maxillary sinus  . KNEE SURGERY    . SHOULDER ARTHROSCOPY W/ ROTATOR CUFF REPAIR Left 09-23-13   per Dr. Delrae Sawyers at [facility] center in [place]    Social History   Socioeconomic History  . Marital status: Married    Spouse name: Not on file  . Number of children: Not on file  . Years of education: Not on file  . Highest education level: Not on file  Occupational History  . Not on file  Social Needs  . Financial resource strain: Not on file  . Food insecurity:    Worry: Not on file    Inability: Not on file  . Transportation needs:    Medical: Not on file    Non-medical: Not on file  Tobacco Use  . Smoking status: Never Smoker  . Smokeless tobacco: Never Used  Substance and Sexual Activity  . Alcohol use: No    Alcohol/week: 0.0 oz  . Drug use: No  . Sexual activity: Not on file  Lifestyle  . Physical activity:    Days per week: Not on file    Minutes per session: Not on file  . Stress: Not on file  Relationships  . Social connections:    Talks on phone: Not on file    Gets together: Not on file    Attends religious service: Not on file    Active member of club or organization: Not on file    Attends meetings of clubs or organizations: Not on file    Relationship status: Not on file  . Intimate partner violence:    Fear of current or ex partner: Not on file    Emotionally abused: Not on file    Physically abused: Not on file    Forced sexual activity: Not on file  Other Topics Concern  . Not on file  Social History Narrative  . Not on file   Current Outpatient Medications on File Prior to Visit  Medication Sig Dispense Refill  . albuterol (VENTOLIN HFA) 108 (90 Base) MCG/ACT inhaler USE 2 INHALATIONS EVERY 4 HOURS AS NEEDED FOR WHEEZING 54 g 3  . Alcohol Swabs PADS Use to check blood  sugar once a day.  DX E11.319 200 each 1  . aspirin 81 MG tablet Take 81 mg by mouth at bedtime.     Marland Kitchen atorvastatin (LIPITOR) 40 MG tablet TAKE 1 TABLET AT BEDTIME 90 tablet 2  . bumetanide (BUMEX) 1 MG tablet TAKE 2 TABLETS AT BEDTIME 180 tablet 3  . cholecalciferol (VITAMIN  D) 1000 UNITS tablet Take 2,000 Units by mouth at bedtime.     Marland Kitchen DILT-XR 240 MG 24 hr capsule TAKE 1 CAPSULE DAILY 90 capsule 3  . EPIPEN 2-PAK 0.3 MG/0.3ML SOAJ injection INJECT 0.3 MLS (0.3 MG TOTAL) INTO THE MUSCLE ONCE. 1 Device 0  . FeAspGl-FeFum-B12-FA-C-Succ Ac (FERREX 28) MISC TAKE 1 TABLET DAILY 90 each 0  . glimepiride (AMARYL) 4 MG tablet Take 1.5 tablets (6 mg total) by mouth 2 (two) times daily before a meal. (Patient taking differently: Take 4 mg by mouth daily with breakfast. Or takes 2 tablets daily) 240 tablet 3  . glucose blood (TRUE METRIX BLOOD GLUCOSE TEST) test strip UAD bid Dx: W09.811 & E11.65 300 each 3  . linagliptin (TRADJENTA) 5 MG TABS tablet TAKE 1 TABLET (5 MG TOTAL) BY MOUTH EVERY MORNING 30 tablet 3  . metFORMIN (GLUCOPHAGE-XR) 500 MG 24 hr tablet TAKE 2 TABLETS TWICE A DAY WITH MEALS 360 tablet 3  . metoprolol succinate (TOPROL-XL) 100 MG 24 hr tablet TAKE 1 TABLET (100 MG TOTAL) BY MOUTH DAILY. TAKE WITH OR IMMEDIATELY FOLLOWING A MEAL. 90 tablet 3  . ONETOUCH DELICA LANCETS 33G MISC Use to check blood sugar twice a day.  DX  E11.319 200 each 1  . quinapril (ACCUPRIL) 20 MG tablet TAKE 1 TABLET AT BEDTIME 90 tablet 2   Current Facility-Administered Medications on File Prior to Visit  Medication Dose Route Frequency Provider Last Rate Last Dose  . 0.9 %  sodium chloride infusion  500 mL Intravenous Once Sherrilyn Rist, MD       No Known Allergies Family History  Problem Relation Age of Onset  . Coronary artery disease Brother   . Coronary artery disease Mother   . Colon cancer Mother   . Cancer Unknown        fhx  . Diabetes Unknown        fhx  . Hyperlipidemia Unknown         fhx  . Hypertension Unknown        fhx  . Stroke Unknown        fhx  . Colon polyps Neg Hx   . Esophageal cancer Neg Hx   . Rectal cancer Neg Hx   . Stomach cancer Neg Hx     PE: BP 126/78   Pulse (!) 51   Ht 5' 4.5" (1.638 m)   Wt 258 lb 3.2 oz (117.1 kg)   SpO2 96%   BMI 43.64 kg/m  Body mass index is 43.64 kg/m.  Wt Readings from Last 3 Encounters:  04/10/18 258 lb 3.2 oz (117.1 kg)  02/06/18 266 lb (120.7 kg)  01/29/18 266 lb 9.6 oz (120.9 kg)   Constitutional: overweight, in NAD Eyes: PERRLA, EOMI, no exophthalmos ENT: moist mucous membranes, no thyromegaly, no cervical lymphadenopathy Cardiovascular: RRR, No MRG, + bilateral LE edema Respiratory: CTA B Gastrointestinal: abdomen soft, NT, ND, BS+ Musculoskeletal: no deformities, strength intact in all 4 Skin: moist, warm, no rashes Neurological: no tremor with outstretched hands, DTR normal in all 4  ASSESSMENT: 1. DM2, non-insulin-dependent, uncontrolled, with complications - CKD stage 2 - background DR  2. CKD stage 2 - 2/2 DM2  3. HL  PLAN:  1.  Patient with long-standing, uncontrolled, type 2 diabetes, on metformin, DPP 4 inhibitor, and sulfonylurea.  We tried SGLT2 inhibitors but she could not continue due to yeast infections.  At last visit, she only checked few sugars which were at  goal or slightly higher, but it was difficult to assess her control.  Her HbA1c was better, at 8.1%, so we continue the current regimen especially since she was also planning to restart exercise. - She did not get a chance to restart exercise since last visit.  Her sugars are higher, but she brings a download of her meter up to 2 months ago.  She is not very clear whether she checked  her sugars in the last 2 months.  I gave her sugar log and strongly advised her to bring this at next visit.  She needs to check sugars once a day, rotating check times. - today, HbA1c is 9.3% (higher) - At this visit, since her HbA1c is higher  and the sugars reviewed are also higher, we discussed about switching from [place] to Trulicity.  I hope that this is covered by her insurance.  We discussed that if she cannot start Trulicity, we may need a low dose of Lantus at least for short period of time until she restarts exercise and her sugars start to improve - I advised her to:  Patient Instructions  Please continue: - Metformin ER 1000 mg in am and 500 mg with dinner - Amaryl 4 mg 2x a day  Please start Trulicity 0.75 mg weekly. Let me know when you are close to running out to call in the higher dose to your pharmacy (1.5 mg).  Stop Tradjenta before the second Trulicity injection.  If we cannot start Trulicity, we may need a low dose Lantus, 10 units at bedtime.  Please return in 3 months with your sugar log.   - advised for yearly eye exams >> she is UTD - Return to clinic in 3 mo with sugar log     2. CKD stage 2 -Most recent kidney function was reviewed from 11/2017: Stable -Continues quinapril without side effects  3. HL - Reviewed latest lipid panel from 02/2017: LDL lower than 100 - Continues Lipitor without side effects. - She will have another lipid panel  later this month at her appointment with PCP  Carlus Pavlov, MD PhD [facility]

## 2018-04-10 NOTE — Addendum Note (Signed)
Addended by: Yolande Jolly on: 04/10/2018 02:32 PM   Modules accepted: Orders

## 2018-04-11 ENCOUNTER — Telehealth: Payer: Self-pay | Admitting: Internal Medicine

## 2018-04-11 ENCOUNTER — Other Ambulatory Visit: Payer: Self-pay | Admitting: Internal Medicine

## 2018-04-11 NOTE — Telephone Encounter (Signed)
Patient stated that the [facility] pharmacy has declined her medication Trulicy.  Please advise  [id number] - [place], [place] - [address] AT [facility] [address] DEA #:  [id number]

## 2018-04-11 NOTE — Telephone Encounter (Signed)
Per office visit you mentioned sending in Lantus, is that what you would like to start her on? Please advise

## 2018-04-11 NOTE — Telephone Encounter (Signed)
No, let's try Victoza - at least for 1 mo  - see prev. msg

## 2018-04-11 NOTE — Telephone Encounter (Signed)
Per pharmacy: NOT COVERED, TRY VICTOZA, BYETTA.

## 2018-04-11 NOTE — Telephone Encounter (Signed)
Would she be OK to try Victoza 0.6 mg daily before b'fast x 7 days, then increase to 1.2 mg daily x 7 days and then 1.8 mg daily.

## 2018-04-13 ENCOUNTER — Telehealth: Payer: Self-pay | Admitting: Internal Medicine

## 2018-04-13 NOTE — Telephone Encounter (Signed)
Mailbox is full will send Victoza to pharmacy

## 2018-04-13 NOTE — Telephone Encounter (Signed)
Error

## 2018-04-16 ENCOUNTER — Telehealth: Payer: Self-pay

## 2018-04-16 MED ORDER — INSULIN PEN NEEDLE 32G X 4 MM MISC: 1 refills | Status: DC

## 2018-04-16 NOTE — Telephone Encounter (Signed)
Pt returned call, she is aware to stop Tradjenta before the second dose of Trulicity and RX sent

## 2018-04-16 NOTE — Telephone Encounter (Signed)
Patient called today to request a refill on pen needles sent to [facility] - and she also wants to know if she is supposed to take tradjenta even though the Dr. Prentiss Bells her on Victoza please call the patient back to clarify this- [phone]

## 2018-04-16 NOTE — Telephone Encounter (Signed)
Mailbox is full.

## 2018-04-17 ENCOUNTER — Other Ambulatory Visit: Payer: Self-pay

## 2018-04-17 MED ORDER — INSULIN PEN NEEDLE 32G X 4 MM MISC: 1 refills | Status: DC

## 2018-04-20 ENCOUNTER — Encounter: Payer: Self-pay | Admitting: Family Medicine

## 2018-04-20 ENCOUNTER — Ambulatory Visit (INDEPENDENT_AMBULATORY_CARE_PROVIDER_SITE_OTHER): Payer: Medicare Other | Admitting: Family Medicine

## 2018-04-20 VITALS — BP 144/78 | HR 54 | Temp 98.2°F | Ht 64.5 in | Wt 261.2 lb

## 2018-04-20 DIAGNOSIS — E11319 Type 2 diabetes mellitus with unspecified diabetic retinopathy without macular edema: Secondary | ICD-10-CM | POA: Diagnosis not present

## 2018-04-20 DIAGNOSIS — M5442 Lumbago with sciatica, left side: Secondary | ICD-10-CM

## 2018-04-20 DIAGNOSIS — M5441 Lumbago with sciatica, right side: Secondary | ICD-10-CM

## 2018-04-20 DIAGNOSIS — E1165 Type 2 diabetes mellitus with hyperglycemia: Secondary | ICD-10-CM

## 2018-04-20 DIAGNOSIS — D508 Other iron deficiency anemias: Secondary | ICD-10-CM | POA: Diagnosis not present

## 2018-04-20 DIAGNOSIS — N182 Chronic kidney disease, stage 2 (mild): Secondary | ICD-10-CM | POA: Diagnosis not present

## 2018-04-20 DIAGNOSIS — E782 Mixed hyperlipidemia: Secondary | ICD-10-CM | POA: Diagnosis not present

## 2018-04-20 DIAGNOSIS — I10 Essential (primary) hypertension: Secondary | ICD-10-CM | POA: Diagnosis not present

## 2018-04-20 DIAGNOSIS — E1122 Type 2 diabetes mellitus with diabetic chronic kidney disease: Secondary | ICD-10-CM | POA: Diagnosis not present

## 2018-04-20 DIAGNOSIS — IMO0002 Reserved for concepts with insufficient information to code with codable children: Secondary | ICD-10-CM

## 2018-04-20 LAB — CBC WITH DIFFERENTIAL/PLATELET
Basophils Absolute: 0 10*3/uL (ref 0.0–0.1)
Basophils Relative: 0.3 % (ref 0.0–3.0)
Eosinophils Absolute: 0.1 10*3/uL (ref 0.0–0.7)
Eosinophils Relative: 1.4 % (ref 0.0–5.0)
HCT: 37.2 % (ref 36.0–46.0)
Hemoglobin: 12.1 g/dL (ref 12.0–15.0)
Lymphocytes Relative: 30.3 % (ref 12.0–46.0)
Lymphs Abs: 2.6 10*3/uL (ref 0.7–4.0)
MCHC: 32.6 g/dL (ref 30.0–36.0)
MCV: 79.2 fl (ref 78.0–100.0)
Monocytes Absolute: 0.5 10*3/uL (ref 0.1–1.0)
Monocytes Relative: 5.9 % (ref 3.0–12.0)
Neutro Abs: 5.3 10*3/uL (ref 1.4–7.7)
Neutrophils Relative %: 62.1 % (ref 43.0–77.0)
Platelets: 230 10*3/uL (ref 150.0–400.0)
RBC: 4.69 Mil/uL (ref 3.87–5.11)
RDW: 14.6 % (ref 11.5–15.5)
WBC: 8.5 10*3/uL (ref 4.0–10.5)

## 2018-04-20 LAB — LIPID PANEL
Cholesterol: 167 mg/dL (ref 0–200)
HDL: 48.6 mg/dL (ref >39.00)
LDL Cholesterol: 96 mg/dL (ref 0–99)
NonHDL: 118.6
Total CHOL/HDL Ratio: 3
Triglycerides: 114 mg/dL (ref 0.0–149.0)
VLDL: 22.8 mg/dL (ref 0.0–40.0)

## 2018-04-20 LAB — BASIC METABOLIC PANEL
BUN: 18 mg/dL (ref 6–23)
CO2: 30 mEq/L (ref 19–32)
Calcium: 9.5 mg/dL (ref 8.4–10.5)
Chloride: 100 mEq/L (ref 96–112)
Creatinine, Ser: 0.89 mg/dL (ref 0.40–1.20)
GFR: 81.11 mL/min (ref >60.00)
Glucose, Bld: 177 mg/dL — ABNORMAL HIGH (ref 70–99)
Potassium: 4.4 mEq/L (ref 3.5–5.1)
Sodium: 141 mEq/L (ref 135–145)

## 2018-04-20 LAB — POC URINALSYSI DIPSTICK (AUTOMATED)
Bilirubin, UA: NEGATIVE
Blood, UA: NEGATIVE
Glucose, UA: NEGATIVE
Ketones, UA: NEGATIVE
Leukocytes, UA: NEGATIVE
Nitrite, UA: NEGATIVE
Protein, UA: NEGATIVE
Spec Grav, UA: 1.015 (ref 1.010–1.025)
Urobilinogen, UA: 0.2 E.U./dL
pH, UA: 6 (ref 5.0–8.0)

## 2018-04-20 LAB — TSH: TSH: 1.03 u[IU]/mL (ref 0.35–4.50)

## 2018-04-20 LAB — HEPATIC FUNCTION PANEL
ALT: 13 U/L (ref 0–35)
AST: 16 U/L (ref 0–37)
Albumin: 4.2 g/dL (ref 3.5–5.2)
Alkaline Phosphatase: 90 U/L (ref 39–117)
Bilirubin, Direct: 0.1 mg/dL (ref 0.0–0.3)
Total Bilirubin: 0.5 mg/dL (ref 0.2–1.2)
Total Protein: 7.4 g/dL (ref 6.0–8.3)

## 2018-04-20 LAB — FERRITIN: Ferritin: 355.3 ng/mL — ABNORMAL HIGH (ref 10.0–291.0)

## 2018-04-20 NOTE — Progress Notes (Signed)
   Subjective:    Patient ID: Krista Benjamin, female    DOB: [birth year], 67 y.o.   MRN: [id number]  HPI Here to follow up on issues. She feels well. She sees Dr. Elvera Lennox for her diabetes, and her last A1c on 04-10-18 was up to 9.3. Dr. Elvera Lennox intended to add Victoza to her regimen, but [facility] decided to double up on exercise and diet instead. Her fasting glucose this am was 116. Her BP has been stable. She could not take Ferrex because of the high cost, so she is taking an OTC iron supplement instead.    Review of Systems  Constitutional: Negative.   HENT: Negative.   Eyes: Negative.   Respiratory: Negative.   Cardiovascular: Negative.   Gastrointestinal: Negative.   Genitourinary: Negative for decreased urine volume, difficulty urinating, dyspareunia, dysuria, enuresis, flank pain, frequency, hematuria, pelvic pain and urgency.  Musculoskeletal: Negative.   Skin: Negative.   Neurological: Negative.   Psychiatric/Behavioral: Negative.        Objective:   Physical Exam  Constitutional: She is oriented to person, place, and time. She appears well-developed and well-nourished. No distress.  HENT:  Head: Normocephalic and atraumatic.  Right Ear: External ear normal.  Left Ear: External ear normal.  Nose: Nose normal.  Mouth/Throat: Oropharynx is clear and moist. No oropharyngeal exudate.  Eyes: Pupils are equal, round, and reactive to light. Conjunctivae and EOM are normal. No scleral icterus.  Neck: Normal range of motion. Neck supple. No JVD present. No thyromegaly present.  Cardiovascular: Normal rate, regular rhythm, normal heart sounds and intact distal pulses. Exam reveals no gallop and no friction rub.  No murmur heard. Pulmonary/Chest: Effort normal and breath sounds normal. No respiratory distress. She has no wheezes. She has no rales. She exhibits no tenderness.  Abdominal: Soft. Bowel sounds are normal. She exhibits no distension and no mass. There is no tenderness. There  is no rebound and no guarding.  Musculoskeletal: Normal range of motion. She exhibits no edema or tenderness.  Lymphadenopathy:    She has no cervical adenopathy.  Neurological: She is alert and oriented to person, place, and time. She has normal reflexes. She displays normal reflexes. No cranial nerve deficit. She exhibits normal muscle tone. Coordination normal.  Skin: Skin is warm and dry. No rash noted. No erythema.  Psychiatric: She has a normal mood and affect. Her behavior is normal. Judgment and thought content normal.          Assessment & Plan:  Her HTN is stable. She will follow up with Dr. Elvera Lennox about her diabets. Get fasting labs today to check her lipids, etc. Check a CBC and a ferritin for the anemia.  Gershon Crane, MD

## 2018-05-01 ENCOUNTER — Encounter: Payer: Self-pay | Admitting: Family Medicine

## 2018-05-01 LAB — HM DIABETES EYE EXAM

## 2018-05-01 LAB — HM DIABETES FOOT EXAM

## 2018-06-15 ENCOUNTER — Encounter: Payer: Self-pay | Admitting: Family Medicine

## 2018-06-15 ENCOUNTER — Telehealth: Payer: Self-pay | Admitting: Family Medicine

## 2018-06-15 ENCOUNTER — Ambulatory Visit (INDEPENDENT_AMBULATORY_CARE_PROVIDER_SITE_OTHER): Payer: Medicare Other

## 2018-06-15 ENCOUNTER — Ambulatory Visit (INDEPENDENT_AMBULATORY_CARE_PROVIDER_SITE_OTHER): Payer: Medicare Other | Admitting: Family Medicine

## 2018-06-15 ENCOUNTER — Other Ambulatory Visit: Payer: Self-pay | Admitting: Family Medicine

## 2018-06-15 VITALS — BP 170/90 | HR 79 | Temp 98.1°F | Ht 64.5 in | Wt 267.2 lb

## 2018-06-15 DIAGNOSIS — S161XXA Strain of muscle, fascia and tendon at neck level, initial encounter: Secondary | ICD-10-CM

## 2018-06-15 DIAGNOSIS — S62101A Fracture of unspecified carpal bone, right wrist, initial encounter for closed fracture: Secondary | ICD-10-CM

## 2018-06-15 DIAGNOSIS — S46911A Strain of unspecified muscle, fascia and tendon at shoulder and upper arm level, right arm, initial encounter: Secondary | ICD-10-CM | POA: Diagnosis not present

## 2018-06-15 DIAGNOSIS — S63501A Unspecified sprain of right wrist, initial encounter: Secondary | ICD-10-CM | POA: Diagnosis not present

## 2018-06-15 NOTE — Addendum Note (Signed)
Addended by: Raj Janus T on: 06/15/2018 03:41 PM   Modules accepted: Orders

## 2018-06-15 NOTE — Telephone Encounter (Signed)
Called and spoke with pt. Urgent referral has been sent today at 3:42 PM. Called pt to advised that this has been done.   Pt stated that she is in so much pain and she could NOT wait to be seen on Monday. They are able to see here today at 5:30 PM.   Pt stated that if they are unable to send something in for pain could Dr. Clent Benjamin?   Sent to PCP to advise

## 2018-06-15 NOTE — Progress Notes (Signed)
   Subjective:    Patient ID: Krista Benjamin, female    DOB: 02-Jun-1951, 67 y.o.   MRN: [id number]  HPI Here for injuries from a fall that just occurred 10 minutes ago in our parking lot. As she was getting out of her car she apparently lost her balance and fell forward with her hands outstretched. This was witnessed by an employee and they escorted her inside for [place] to check her. She complains mostly of pain in the right wrist, although she has some mild stiffness and soreness in the neck and right shoulder. No blurred vision or headache or nausea. No LOC.    Review of Systems  Constitutional: Negative.   Respiratory: Negative.   Cardiovascular: Negative.   Musculoskeletal: Positive for arthralgias, neck pain and neck stiffness.  Neurological: Negative.        Objective:   Physical Exam  Constitutional: She is oriented to person, place, and time. She appears well-developed and well-nourished. No distress.  HENT:  Head: Normocephalic and atraumatic.  Eyes: Pupils are equal, round, and reactive to light. EOM are normal.  Neck:  She is mildly tender in the right posterior neck but ROM is full  Cardiovascular: Normal rate, regular rhythm, normal heart sounds and intact distal pulses.  Pulmonary/Chest: Effort normal and breath sounds normal.  Musculoskeletal:  The right shoulder is slightly tender anteriorly but has full ROM. The right wrist is swollen and quite tender over the distal radius. ROM is limited by pain. The right elbow and hand are intact. Xrays of the wrist show a slightly displaced fracture of the distal radius   Neurological: She is alert and oriented to person, place, and time.          Assessment & Plan:  Radial fracture. The wrist was immobilized with a splint. We will have Orthopedics see her this afternoon. The neck and shoulder strains are mild and should resolve quickly.  Gershon Crane, MD

## 2018-06-15 NOTE — Telephone Encounter (Signed)
Copied from CRM [id number]. Topic: General - Other >> Jun 15, 2018  3:22 PM Tamela Oddi wrote: Reason for CRM: Patient called to request a referral to [facility] for ortho treatment.  Patient stated that she is in a lot of pain and needs to be sent today.  She also stated that the referral has to be sent to them today in order for her to been seen today by 5pm.  The fax # is [phone].  Please call patient once the referral has been sent so she will know whether she can go to [facility] for treatment.  CB# [phone].

## 2018-06-15 NOTE — Telephone Encounter (Signed)
This was done and she is set to see Murphy-Wainer today at 5:30

## 2018-06-18 ENCOUNTER — Ambulatory Visit (INDEPENDENT_AMBULATORY_CARE_PROVIDER_SITE_OTHER): Payer: Medicare Other | Admitting: Family Medicine

## 2018-06-18 ENCOUNTER — Encounter (INDEPENDENT_AMBULATORY_CARE_PROVIDER_SITE_OTHER): Payer: Self-pay | Admitting: Family Medicine

## 2018-06-18 DIAGNOSIS — S62231A Other displaced fracture of base of first metacarpal bone, right hand, initial encounter for closed fracture: Secondary | ICD-10-CM

## 2018-06-18 MED ORDER — HYDROCODONE-ACETAMINOPHEN 5-325 MG PO TABS: 1.0000 | ORAL_TABLET | Freq: Four times a day (QID) | ORAL | 0 refills | Status: DC | PRN

## 2018-06-18 NOTE — Progress Notes (Signed)
Office Visit Note   Patient: Krista Benjamin           Date of Birth: 1951-05-26           MRN: [id number] Visit Date: 06/18/2018 Requested by: Nelwyn Salisbury, MD [address] [place], [place] [address] PCP: Nelwyn Salisbury, MD  Subjective: Chief Complaint  Patient presents with  . Right Wrist - Injury    DOI 06/16/18 - fell forward onto outstretched hands.  Right-hand dominant.    HPI: She is a 67 year old right-hand-dominant female seen at the request of Dr. Clent Ridges for right wrist and hand fracture.  On September 6 she was walking across her doctor's parking lot, tripped and fell and caught herself with her right hand.  Immediate pain, she went into the doctor's office and was evaluated with x-rays which show a fracture dislocation of the first [facility] joint as well as an intra-articular distal radius fracture.  She was placed in a radial gutter splint and now presents for evaluation.  She has a history of carpal tunnel release in the right wrist but did well after that.  She has poorly controlled diabetes as well as hypertension.  She is a non-smoker.  She works driving a school bus.              ROS: Other systems were negative.  Objective: Vital Signs: There were no vitals taken for this visit.  Physical Exam:  Right wrist: She is neurovascularly intact.  I did not remove her splint today.  Imaging: X-rays were reviewed on computer with patient.  Assessment & Plan: 1.  3 days status post fall with right hand first [facility] fracture dislocation as well as intra-articular distal radius fracture -I think it would be best for her to see a hand surgeon for management of this.  I have called Dr. Ronie Spies office and they will call her to get her in either today or tomorrow. -Prescription given for pain medicine.   Follow-Up Instructions: No follow-ups on file.     Procedures: None today.   PMFS History: Patient Active Problem List   Diagnosis Date Noted  . CKD stage 2 due  to type 2 diabetes mellitus (HCC) 04/27/2017  . Uncontrolled type 2 diabetes mellitus with retinopathy, without long-term current use of insulin (HCC) 11/27/2015  . Bronchitis with bronchospasm 03/16/2013  . AORTIC VALVE DISORDERS 04/26/2010  . OBESITY, UNSPECIFIED 05/07/2009  . MURMUR 05/07/2009  . CHEST PAIN 05/07/2009  . LOW BACK PAIN 01/09/2008  . ANEMIA-IRON DEFICIENCY 11/21/2007  . Weakness 11/21/2007  . Hyperlipemia, mixed 10/26/2007  . Essential hypertension 10/26/2007   Past Medical History:  Diagnosis Date  . Anemia   . Aortic stenosis    Mild  . Arthritis   . Cataract    both eyes  (cataracts removed)  . Diabetes mellitus    sees Dr. Elvera Lennox   . Gynecological examination    sees Dr. Jennette Kettle   . Heart murmur   . Hyperlipidemia   . Hypertension    sees Dr. Angelina Sheriff  . Stroke [facility])    TIA    Family History  Problem Relation Age of Onset  . Coronary artery disease Brother   . Coronary artery disease Mother   . Colon cancer Mother   . Cancer Unknown        fhx  . Diabetes Unknown        fhx  . Hyperlipidemia Unknown        fhx  .  Hypertension Unknown        fhx  . Stroke Unknown        fhx  . Colon polyps Neg Hx   . Esophageal cancer Neg Hx   . Rectal cancer Neg Hx   . Stomach cancer Neg Hx     Past Surgical History:  Procedure Laterality Date  . CARPAL TUNNEL RELEASE  01/22/14   Left Hand   . CATARACT EXTRACTION     Lens surgery  . COLONOSCOPY  02/06/2018   per Dr. Myrtie Neither, no polyps, repeat in 5 yrs (colon CA in mother)   . excision of benign cyst     from left maxilla and left maxillary sinus  . KNEE SURGERY    . SHOULDER ARTHROSCOPY W/ ROTATOR CUFF REPAIR Left 09-23-13   per Dr. Delrae Sawyers at [facility] center in [place]    Social History   Occupational History  . Not on file  Tobacco Use  . Smoking status: Never Smoker  . Smokeless tobacco: Never Used  Substance and Sexual Activity  . Alcohol use: No    Alcohol/week: 0.0  standard drinks  . Drug use: No  . Sexual activity: Not on file

## 2018-07-26 ENCOUNTER — Encounter: Payer: Self-pay | Admitting: Internal Medicine

## 2018-07-26 ENCOUNTER — Ambulatory Visit: Payer: Medicare Other | Admitting: Internal Medicine

## 2018-07-26 VITALS — BP 159/90 | HR 69 | Ht 64.5 in | Wt 257.0 lb

## 2018-07-26 DIAGNOSIS — E11319 Type 2 diabetes mellitus with unspecified diabetic retinopathy without macular edema: Secondary | ICD-10-CM | POA: Diagnosis not present

## 2018-07-26 DIAGNOSIS — Z23 Encounter for immunization: Secondary | ICD-10-CM | POA: Diagnosis not present

## 2018-07-26 DIAGNOSIS — E1165 Type 2 diabetes mellitus with hyperglycemia: Secondary | ICD-10-CM

## 2018-07-26 DIAGNOSIS — E782 Mixed hyperlipidemia: Secondary | ICD-10-CM

## 2018-07-26 DIAGNOSIS — IMO0002 Reserved for concepts with insufficient information to code with codable children: Secondary | ICD-10-CM

## 2018-07-26 LAB — POCT GLYCOSYLATED HEMOGLOBIN (HGB A1C): Hemoglobin A1C: 7.7 % — AB (ref 4.0–5.6)

## 2018-07-26 MED ORDER — LINAGLIPTIN 5 MG PO TABS: ORAL_TABLET | ORAL | 3 refills | Status: DC

## 2018-07-26 MED ORDER — METFORMIN HCL ER 500 MG PO TB24: ORAL_TABLET | ORAL | 3 refills | Status: DC

## 2018-07-26 MED ORDER — GLIMEPIRIDE 4 MG PO TABS: 4.0000 mg | ORAL_TABLET | Freq: Two times a day (BID) | ORAL | 3 refills | Status: DC

## 2018-07-26 NOTE — Patient Instructions (Signed)
Please increase:  Metformin ER to 1000 mg 2x a day with meals  Continue: - Amaryl 4 mg 2x a day before meals - Tradjenta 5 mg before b'fast  Let me know in 1 month if sugars do not improve, in which case, we need to start Victoza.  Please return in 3 months with your sugar log.

## 2018-07-26 NOTE — Progress Notes (Signed)
Patient ID: Krista Benjamin, female   DOB: 23-Jul-1951, 67 y.o.   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  Last visit 3.5 mo ago. She has [employer] + [employer].   She fell and broke R wrist last month.   Last hemoglobin A1c:  Lab Results  Component Value Date   HGBA1C 9.3 (A) 04/10/2018   HGBA1C 8.1 11/07/2017   HGBA1C 8.8 08/01/2017  Prev. 5.9% on 12/2012, previously 6.1%.  Pt is on a regimen of: - Metformin ER [phone] mg in am and [phone] mg with dinner - Amaryl 4 mg 2x a day before meals - Tradjenta 5 mg in am Reviewed previous medications tried: Rec'd GLP1 agonist >> Victoza covered >> she was afraid of SEs.+ We tried Jardiance 10 mg daily in am - added 04/2017 >> yeast inf >> stopped We tried Invokana 100 mg in am >> stopped after 3 days >> nausea, diarrhea We stopped Lantus 10 units qhs. We stopped Actos 45 mg (was on it since 07/2001) Metformin 1000 mg bid >> stopped b/c Nausea/Diarrhea She was on Novolog 8 units tid ac, when sugars >140   Pt checks her sugars 1-4X a day: - am: 97-171 >> 133, 156, 207 >> 105-191 - 2h after b'fast: 152-204 >> 111-146, 276 >> n/c >> 148 - lunch: 140, 240 >> n/c >> 135-169 >> 183 - 2h after lunch:  201 >> 303 >> n/c >> 201 - dinner: 114 >> 137, 207 >> 62-187 >> n/c - 2h after dinner:87-203 >> 108, 170-286 >> 76-208 - bedtime: 152-238, 325 >> see above >> 180-226 (grapes) - night:  68x1 >> ?  >> 76, 137 >> n/c >> 184 Lowest sugar:  62 x1 >> 108 >> 99; she has hypoglycemia awareness  In the 70s.  Highest sugar: 300 x1 >> 286 >> 300 (at the time of her fx).  Glucometer: One Touch Verio Flex  Pt's meals are:  - Breakfast: smoothies >> belvita 1 package - Lunch: meat + veggies - mostly skips - Dinner: meat + veggies - Snacks: 1-2, fruit, yoghurt  She lost 80 lbs since 2012 >> could come  off insulin. She saw Oran Rein (nutritionist) on 01/01/2015.  - + CKD stage 2. Latest BUN/creatinine was:  Lab Results  Component Value Date   BUN 18 04/20/2018   Lab Results  Component Value Date   CREATININE 0.89 04/20/2018  On quinapril  - + HL. Last lipid profile: Lab Results  Component Value Date   CHOL 167 04/20/2018   HDL 48.60 04/20/2018   LDLCALC 96 04/20/2018   TRIG 114.0 04/20/2018   CHOLHDL 3 04/20/2018  On Lipitor - last eye exam: 03/2018: bkgd DR.  + h/o B cataract sx. - no numbness and tingling in her legs.  She also has a history of HTN, iron deficiency anemia-on iron therapy, aortic valve disease/murmur, left ventricular hypertrophy per EKG, obesity, multinodular goiter, fibroids.  She had shingles in 11/2014 and 02/2016.  ROS: Constitutional: no weight gain/no weight loss, no fatigue, no subjective hyperthermia, no subjective hypothermia Eyes: no blurry vision, no xerophthalmia ENT: no sore throat, no nodules palpated in throat, no dysphagia, no odynophagia, no hoarseness Cardiovascular: no CP/no SOB/no palpitations/no leg swelling Respiratory: no cough/no SOB/no wheezing Gastrointestinal: no N/no V/no D/no C/no acid reflux Musculoskeletal: no muscle aches/no joint aches Skin: no rashes, no hair loss Neurological: no tremors/no  numbness/no tingling/no dizziness  I reviewed pt's medications, allergies, PMH, social hx, family hx, and changes were documented in the history of present illness. Otherwise, unchanged from my initial visit note.  Past Medical History:  Diagnosis Date  . Anemia   . Aortic stenosis    Mild  . Arthritis   . Cataract    both eyes  (cataracts removed)  . Diabetes mellitus    sees Dr. Elvera Lennox   . Gynecological examination    sees Dr. Jennette Kettle   . Heart murmur   . Hyperlipidemia   . Hypertension    sees Dr. Angelina Sheriff  . Stroke [facility])    TIA   Past Surgical History:  Procedure Laterality Date  . CARPAL TUNNEL RELEASE   01/22/14   Left Hand   . CATARACT EXTRACTION     Lens surgery  . COLONOSCOPY  02/06/2018   per Dr. Myrtie Neither, no polyps, repeat in 5 yrs (colon CA in mother)   . excision of benign cyst     from left maxilla and left maxillary sinus  . KNEE SURGERY    . SHOULDER ARTHROSCOPY W/ ROTATOR CUFF REPAIR Left 09-23-13   per Dr. Delrae Sawyers at [facility] center in [place]    Social History   Socioeconomic History  . Marital status: Married    Spouse name: Not on file  . Number of children: Not on file  . Years of education: Not on file  . Highest education level: Not on file  Occupational History  . Not on file  Social Needs  . Financial resource strain: Not on file  . Food insecurity:    Worry: Not on file    Inability: Not on file  . Transportation needs:    Medical: Not on file    Non-medical: Not on file  Tobacco Use  . Smoking status: Never Smoker  . Smokeless tobacco: Never Used  Substance and Sexual Activity  . Alcohol use: No    Alcohol/week: 0.0 standard drinks  . Drug use: No  . Sexual activity: Not on file  Lifestyle  . Physical activity:    Days per week: Not on file    Minutes per session: Not on file  . Stress: Not on file  Relationships  . Social connections:    Talks on phone: Not on file    Gets together: Not on file    Attends religious service: Not on file    Active member of club or organization: Not on file    Attends meetings of clubs or organizations: Not on file    Relationship status: Not on file  . Intimate partner violence:    Fear of current or ex partner: Not on file    Emotionally abused: Not on file    Physically abused: Not on file    Forced sexual activity: Not on file  Other Topics Concern  . Not on file  Social History Narrative  . Not on file   Current Outpatient Medications on File Prior to Visit  Medication Sig Dispense Refill  . albuterol (VENTOLIN HFA) 108 (90 Base) MCG/ACT inhaler USE 2 INHALATIONS EVERY 4 HOURS AS  NEEDED FOR WHEEZING 54 g 3  . Alcohol Swabs PADS Use to check blood sugar once a day.  DX E11.319 200 each 1  . aspirin 81 MG tablet Take 81 mg by mouth at bedtime.     Marland Kitchen atorvastatin (LIPITOR) 40 MG tablet TAKE 1 TABLET AT BEDTIME 90 tablet 2  .  bumetanide (BUMEX) 1 MG tablet TAKE 2 TABLETS AT BEDTIME 180 tablet 3  . cholecalciferol (VITAMIN D) 1000 UNITS tablet Take 2,000 Units by mouth at bedtime.     Marland Kitchen DILT-XR 240 MG 24 hr capsule TAKE 1 CAPSULE DAILY 90 capsule 3  . EPIPEN 2-PAK 0.3 MG/0.3ML SOAJ injection INJECT 0.3 MLS (0.3 MG TOTAL) INTO THE MUSCLE ONCE. 1 Device 0  . glimepiride (AMARYL) 4 MG tablet Take 1.5 tablets (6 mg total) by mouth 2 (two) times daily before a meal. (Patient taking differently: Take 4 mg by mouth daily with breakfast. Or takes 2 tablets daily) 240 tablet 3  . glucose blood (TRUE METRIX BLOOD GLUCOSE TEST) test strip UAD bid Dx: W09.811 & E11.65 300 each 3  . HYDROcodone-acetaminophen (NORCO/VICODIN) 5-325 MG tablet Take 1 tablet by mouth every 6 (six) hours as needed for moderate pain. 30 tablet 0  . Insulin Pen Needle (CAREFINE PEN NEEDLES) 32G X 4 MM MISC Use with victoza 100 each 1  . linagliptin (TRADJENTA) 5 MG TABS tablet TAKE 1 TABLET (5 MG TOTAL) BY MOUTH EVERY MORNING 30 tablet 3  . liraglutide (VICTOZA) 18 MG/3ML SOPN 0.6 mg daily before b'fast x 7 days, then increase to 1.2 mg daily x 7 days and then 1.8 mg daily (Patient not taking: Reported on 06/18/2018) 4 pen 2  . metFORMIN (GLUCOPHAGE-XR) 500 MG 24 hr tablet TAKE 2 TABLETS TWICE A DAY WITH MEALS 360 tablet 3  . metoprolol succinate (TOPROL-XL) 100 MG 24 hr tablet TAKE 1 TABLET (100 MG TOTAL) BY MOUTH DAILY. TAKE WITH OR IMMEDIATELY FOLLOWING A MEAL. 90 tablet 3  . NON FORMULARY     . ONETOUCH DELICA LANCETS 33G MISC Use to check blood sugar twice a day.  DX  E11.319 200 each 1  . quinapril (ACCUPRIL) 20 MG tablet TAKE 1 TABLET AT BEDTIME 90 tablet 2   Current Facility-Administered Medications on File  Prior to Visit  Medication Dose Route Frequency Provider Last Rate Last Dose  . 0.9 %  sodium chloride infusion  500 mL Intravenous Once Sherrilyn Rist, MD       No Known Allergies Family History  Problem Relation Age of Onset  . Coronary artery disease Brother   . Coronary artery disease Mother   . Colon cancer Mother   . Cancer Unknown        fhx  . Diabetes Unknown        fhx  . Hyperlipidemia Unknown        fhx  . Hypertension Unknown        fhx  . Stroke Unknown        fhx  . Colon polyps Neg Hx   . Esophageal cancer Neg Hx   . Rectal cancer Neg Hx   . Stomach cancer Neg Hx     PE: There were no vitals taken for this visit. There is no height or weight on file to calculate BMI.  Wt Readings from Last 3 Encounters:  06/15/18 267 lb 3.2 oz (121.2 kg)  04/20/18 261 lb 3.2 oz (118.5 kg)  04/10/18 258 lb 3.2 oz (117.1 kg)   Constitutional: overweight, in NAD Eyes: PERRLA, EOMI, no exophthalmos ENT: moist mucous membranes, no thyromegaly, no cervical lymphadenopathy Cardiovascular: RRR, No MRG, + LE edema - B Respiratory: CTA B Gastrointestinal: abdomen soft, NT, ND, BS+ Musculoskeletal: no deformities, strength intact in all 4 Skin: moist, warm, no rashes Neurological: no tremor with outstretched hands, DTR normal  in all 4  ASSESSMENT: 1. DM2, non-insulin-dependent, uncontrolled, with complications - CKD stage 2 - background DR  2. CKD stage 2 - 2/2 DM2  3. HL  PLAN:  1.  Patient with long standing, uncontrolled, DM2, on metformin, GLP-1 receptor agonist, and sulfonylurea we tried SGLT2 inhibitors but she could not tolerate them due to yeast infections.  At last visit, HbA1c was higher, at 9.3%.  At that time, we discussed about starting a GLP-1 receptor agonist.  Her insurance would not cover weekly formulations so she got Victoza.  We did discuss at that time that if this is not covered, or she does not respond well to it, we may need to start long-acting  insulin. She tells me at this visit that she did not start it as she was afraid of SEs. - at this visit, sugars have improved but they are still above goal, especially at bedtime >> discussed reducing snacks. She is eating fruit (grapes) b/w meals - she is taking 500 mg Metformin when sugars are at goal and 1000 mg when they are high >> will increase the dose to 1000 mg 2x a day regardless of the sugars. If CBGs not improving after this > will need to add Victoza. She has this at home. Reassured her Re; SEs - I advised her to:  Patient Instructions  Please increase:  Metformin ER to 1000 mg 2x a day with meals  Continue: - Amaryl 4 mg 2x a day before meals - Tradjenta 5 mg before b'fast  Let me know in 1 month if sugars do not improve, in which case, we need to start Victoza.  Please return in 3 months with your sugar log.   - today, HbA1c is 7.7% (better) - continue checking sugars at different times of the day - check 1x a day, rotating checks - advised for yearly eye exams >> she is UTD - + flu shot today - Return to clinic in 3 mo with sugar log     2. CKD stage 2 - stable at last check in 04/2018 -Continues quinapril without side effects  3. HL - Reviewed latest lipid panel from 04/2018: great! Lab Results  Component Value Date   CHOL 167 04/20/2018   HDL 48.60 04/20/2018   LDLCALC 96 04/20/2018   TRIG 114.0 04/20/2018   CHOLHDL 3 04/20/2018  - Continues Lipitor without side effects.  Carlus Pavlov, MD PhD [facility]

## 2018-07-26 NOTE — Addendum Note (Signed)
Addended by: Darliss Ridgel I on: 07/26/2018 11:20 AM   Modules accepted: Orders

## 2018-08-09 NOTE — Progress Notes (Signed)
HPI The patient presents for 2 year followup of her known mild aortic stenosis.  I have not seen her since 2017.  In 2015 her echo showed the AS was mild.    She returns for follow up.  It has been a couple of years since her follow-up.  She is done well but she has had some slightly increased shortness of breath.  She has not been particularly active because she fell and broke her wrist and she has not been back at the gym.  She denies any chest pressure, neck or arm discomfort.  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  If she has to hurry she might get a little short of breath.  She might get a little short of breath climbing the stairs but this has been chronic.  She has not had any new PND or orthopnea.  She is not had any chest pressure, neck or arm discomfort.  She denies any palpitations, presyncope or syncope.   No Known Allergies  Current Outpatient Medications  Medication Sig Dispense Refill  . albuterol (VENTOLIN HFA) 108 (90 Base) MCG/ACT inhaler USE 2 INHALATIONS EVERY 4 HOURS AS NEEDED FOR WHEEZING 54 g 3  . Alcohol Swabs PADS Use to check blood sugar once a day.  DX E11.319 200 each 1  . aspirin 81 MG tablet Take 81 mg by mouth at bedtime.     Marland Kitchen atorvastatin (LIPITOR) 40 MG tablet TAKE 1 TABLET AT BEDTIME 90 tablet 2  . bumetanide (BUMEX) 1 MG tablet TAKE 2 TABLETS AT BEDTIME 180 tablet 3  . cholecalciferol (VITAMIN D) 1000 UNITS tablet Take 2,000 Units by mouth at bedtime.     Marland Kitchen DILT-XR 240 MG 24 hr capsule TAKE 1 CAPSULE DAILY 90 capsule 3  . EPIPEN 2-PAK 0.3 MG/0.3ML SOAJ injection INJECT 0.3 MLS (0.3 MG TOTAL) INTO THE MUSCLE ONCE. 1 Device 0  . glimepiride (AMARYL) 4 MG tablet Take 1 tablet (4 mg total) by mouth 2 (two) times daily before a meal. 180 tablet 3  . glucose blood (TRUE METRIX BLOOD GLUCOSE TEST) test strip UAD bid Dx: Z61.096 & E11.65 300 each 3  . HYDROcodone-acetaminophen (NORCO/VICODIN) 5-325 MG tablet Take 1 tablet by mouth  every 6 (six) hours as needed for moderate pain. 30 tablet 0  . Insulin Pen Needle (CAREFINE PEN NEEDLES) 32G X 4 MM MISC Use with victoza 100 each 1  . linagliptin (TRADJENTA) 5 MG TABS tablet TAKE 1 TABLET (5 MG TOTAL) BY MOUTH EVERY MORNING 90 tablet 3  . liraglutide (VICTOZA) 18 MG/3ML SOPN 0.6 mg daily before b'fast x 7 days, then increase to 1.2 mg daily x 7 days and then 1.8 mg daily 4 pen 2  . metFORMIN (GLUCOPHAGE-XR) 500 MG 24 hr tablet TAKE 2 TABLETS TWICE A DAY WITH MEALS 360 tablet 3  . metoprolol succinate (TOPROL-XL) 100 MG 24 hr tablet TAKE 1 TABLET (100 MG TOTAL) BY MOUTH DAILY. TAKE WITH OR IMMEDIATELY FOLLOWING A MEAL. 90 tablet 3  . NON FORMULARY     . ONETOUCH DELICA LANCETS 33G MISC Use to check blood sugar twice a day.  DX  E11.319 200 each 1  . quinapril (ACCUPRIL) 20 MG tablet TAKE 1 TABLET AT BEDTIME 90 tablet 2   Current Facility-Administered Medications  Medication Dose Route Frequency Provider Last Rate Last Dose  . 0.9 %  sodium chloride infusion  500 mL Intravenous Once Charlie Pitter III,  MD        Past Medical History:  Diagnosis Date  . Anemia   . Aortic stenosis    Mild  . Arthritis   . Cataract    both eyes  (cataracts removed)  . Diabetes mellitus    sees Dr. Elvera Lennox   . Gynecological examination    sees Dr. Jennette Kettle   . Heart murmur   . Hyperlipidemia   . Hypertension    sees Dr. Angelina Sheriff  . Stroke [facility])    TIA    Past Surgical History:  Procedure Laterality Date  . CARPAL TUNNEL RELEASE  01/22/14   Left Hand   . CATARACT EXTRACTION     Lens surgery  . COLONOSCOPY  02/06/2018   per Dr. Myrtie Neither, no polyps, repeat in 5 yrs (colon CA in mother)   . excision of benign cyst     from left maxilla and left maxillary sinus  . KNEE SURGERY    . SHOULDER ARTHROSCOPY W/ ROTATOR CUFF REPAIR Left 09-23-13   per Dr. Delrae Sawyers at [facility] center in [place]     ROS:  As stated in the HPI and negative for all other  systems.  PHYSICAL EXAM BP 130/60   Pulse 73   Ht 5\' 5"  (1.651 m)   Wt 261 lb (118.4 kg)   BMI 43.43 kg/m  GENERAL:  Well appearing NECK:  No jugular venous distention, waveform within normal limits, carotid upstroke brisk and symmetric, no bruits, no thyromegaly LUNGS:  Clear to auscultation bilaterally CHEST:  Unremarkable HEART:  PMI not displaced or sustained,S1 and S2 within normal limits, no S3, no S4, no clicks, no rubs, soft apical systolic murmur radiating slightly at the aortic outflow tract and into the right carotid, no diastolic murmurs ABD:  Flat, positive bowel sounds normal in frequency in pitch, no bruits, no rebound, no guarding, no midline pulsatile mass, no hepatomegaly, no splenomegaly, obese EXT:  2 plus pulses throughout, no edema, no cyanosis no clubbing   EKG:   Sinus rhythm, rate 62, left bundle branch block, left axis deviation unchanged from previous.   08/10/2018  Lab Results  Component Value Date   HGBA1C 7.7 (A) 07/26/2018   Lab Results  Component Value Date   CHOL 167 04/20/2018   TRIG 114.0 04/20/2018   HDL 48.60 04/20/2018   LDLCALC 96 04/20/2018     ASSESSMENT AND PLAN  AORTIC STENOSIS:    This has been mild.  However, since she has some mildly increased shortness of breath I will repeat an echocardiogram.   HTN:  Her blood pressure is well controlled.  She will continue the meds as listed.   OBESITY:   We had a discussion about exercise and she understands weight loss that she is been very successful at this over the years and she is still working on it.   DM:  Her A1C is as above.   This is actually lower.  She was supposed to be taking Victoza but she did not start this.  I refer her back to her primary care physician for follow-up and management.

## 2018-08-10 ENCOUNTER — Ambulatory Visit: Payer: Medicare Other | Admitting: Cardiology

## 2018-08-10 ENCOUNTER — Encounter: Payer: Self-pay | Admitting: Cardiology

## 2018-08-10 VITALS — BP 130/60 | HR 73 | Ht 65.0 in | Wt 261.0 lb

## 2018-08-10 DIAGNOSIS — I350 Nonrheumatic aortic (valve) stenosis: Secondary | ICD-10-CM

## 2018-08-10 DIAGNOSIS — I10 Essential (primary) hypertension: Secondary | ICD-10-CM

## 2018-08-10 DIAGNOSIS — Z794 Long term (current) use of insulin: Secondary | ICD-10-CM | POA: Diagnosis not present

## 2018-08-10 DIAGNOSIS — E119 Type 2 diabetes mellitus without complications: Secondary | ICD-10-CM

## 2018-08-10 NOTE — Patient Instructions (Signed)
Medication Instructions:  Continue current medications  If you need a refill on your cardiac medications before your next appointment, please call your pharmacy.  Labwork: None Ordered   If you have labs (blood work) drawn today and your tests are completely normal, you will receive your results only by: Marland Kitchen MyChart Message (if you have MyChart) OR . A paper copy in the mail If you have any lab test that is abnormal or we need to change your treatment, we will call you to review the results.  Testing/Procedures: Your physician has requested that you have an echocardiogram. Echocardiography is a painless test that uses sound waves to create images of your heart. It provides your doctor with information about the size and shape of your heart and how well your heart's chambers and valves are working. This procedure takes approximately one hour. There are no restrictions for this procedure.  Follow-Up: You will need a follow up appointment in 2 Year.  Please call our office 2 months in [phone]) to schedule the (2 Year) appointment.  You may see  DR Antoine Poche or one of the following Advanced Practice Providers on your designated Care Team:   . Joni Reining, DNP, ANP . Rhonda Barrett, PA-C .  Marland Kitchen Corine Shelter, PA-C . Dot Lanes Kroeger, PA-C . Marjie Skiff, PA-C .  Marland Kitchen Azalee Course, PA-C . Micah Flesher, PA-C  At [facility], you and your health needs are our priority.  As part of our continuing mission to provide you with exceptional heart care, we have created designated Provider Care Teams.  These Care Teams include your primary Cardiologist (physician) and Advanced Practice Providers (APPs -  Physician Assistants and Nurse Practitioners) who all work together to provide you with the care you need, when you need it.   Thank you for choosing [facility] at [facility]!!

## 2018-08-12 ENCOUNTER — Encounter: Payer: Self-pay | Admitting: Cardiology

## 2018-08-12 DIAGNOSIS — I350 Nonrheumatic aortic (valve) stenosis: Secondary | ICD-10-CM | POA: Insufficient documentation

## 2018-08-21 ENCOUNTER — Other Ambulatory Visit (HOSPITAL_COMMUNITY): Payer: Medicare Other

## 2018-08-25 ENCOUNTER — Other Ambulatory Visit: Payer: Self-pay | Admitting: Internal Medicine

## 2018-08-31 ENCOUNTER — Other Ambulatory Visit: Payer: Self-pay

## 2018-08-31 ENCOUNTER — Ambulatory Visit (HOSPITAL_COMMUNITY): Payer: Medicare Other | Attending: Cardiology

## 2018-08-31 DIAGNOSIS — I350 Nonrheumatic aortic (valve) stenosis: Secondary | ICD-10-CM

## 2018-09-09 NOTE — H&P (View-Only) (Signed)
HPI The patient presents for followup of her known aortic stenosis.   After her last visit I sent her for an echocardiogram and she was found to have a 40 - 45% EF.    There was no evidence of aortic stenosis.  She returns and says that she is continued to have shortness of breath when she gets up and does stop such as walking upstairs.  She has not been as active since she broke her wrist and she has been healing from this.  She still works as a Chief Strategy Officer.  She sleeps in a recliner chronically for the last several years.  She is not having any chest pressure, neck or arm discomfort.  She is not having any palpitations, presyncope or syncope.   No Known Allergies  Current Outpatient Medications  Medication Sig Dispense Refill  . albuterol (VENTOLIN HFA) 108 (90 Base) MCG/ACT inhaler USE 2 INHALATIONS EVERY 4 HOURS AS NEEDED FOR WHEEZING 54 g 3  . Alcohol Swabs PADS Use to check blood sugar once a day.  DX E11.319 200 each 1  . aspirin 81 MG tablet Take 81 mg by mouth at bedtime.     Marland Kitchen atorvastatin (LIPITOR) 40 MG tablet TAKE 1 TABLET AT BEDTIME 90 tablet 2  . bumetanide (BUMEX) 1 MG tablet TAKE 2 TABLETS AT BEDTIME 180 tablet 3  . cholecalciferol (VITAMIN D) 1000 UNITS tablet Take 2,000 Units by mouth at bedtime.     Marland Kitchen DILT-XR 240 MG 24 hr capsule TAKE 1 CAPSULE DAILY 90 capsule 3  . EPIPEN 2-PAK 0.3 MG/0.3ML SOAJ injection INJECT 0.3 MLS (0.3 MG TOTAL) INTO THE MUSCLE ONCE. 1 Device 0  . glimepiride (AMARYL) 4 MG tablet Take 1 tablet (4 mg total) by mouth 2 (two) times daily before a meal. 180 tablet 3  . glucose blood (TRUE METRIX BLOOD GLUCOSE TEST) test strip UAD bid Dx: Z61.096 & E11.65 300 each 3  . HYDROcodone-acetaminophen (NORCO/VICODIN) 5-325 MG tablet Take 1 tablet by mouth every 6 (six) hours as needed for moderate pain. 30 tablet 0  . Insulin Pen Needle (CAREFINE PEN NEEDLES) 32G X 4 MM MISC Use with victoza 100 each 1  . metFORMIN (GLUCOPHAGE-XR) 500 MG 24 hr  tablet TAKE 2 TABLETS TWICE A DAY WITH MEALS 360 tablet 3  . metoprolol succinate (TOPROL-XL) 100 MG 24 hr tablet TAKE 1 TABLET (100 MG TOTAL) BY MOUTH DAILY. TAKE WITH OR IMMEDIATELY FOLLOWING A MEAL. 90 tablet 3  . NON FORMULARY     . ONETOUCH DELICA LANCETS 33G MISC Use to check blood sugar twice a day.  DX  E11.319 200 each 1  . quinapril (ACCUPRIL) 20 MG tablet Take 1 tablet (20 mg total) by mouth 2 (two) times daily. 180 tablet 3  . TRADJENTA 5 MG TABS tablet TAKE 1 TABLET BY MOUTH EVERY DAY IN THE MORNING 30 tablet 0   Current Facility-Administered Medications  Medication Dose Route Frequency Provider Last Rate Last Dose  . 0.9 %  sodium chloride infusion  500 mL Intravenous Once Sherrilyn Rist, MD        Past Medical History:  Diagnosis Date  . Anemia   . Aortic stenosis    Mild  . Arthritis   . Cataract    both eyes  (cataracts removed)  . Diabetes mellitus    sees Dr. Elvera Lennox   . Gynecological examination    sees Dr. Jennette Kettle   . Hyperlipidemia   .  Hypertension    sees Dr. Angelina Sheriff  . Stroke [facility])    TIA    Past Surgical History:  Procedure Laterality Date  . CARPAL TUNNEL RELEASE  01/22/14   Left Hand   . CATARACT EXTRACTION     Lens surgery  . COLONOSCOPY  02/06/2018   per Dr. Myrtie Neither, no polyps, repeat in 5 yrs (colon CA in mother)   . excision of benign cyst     from left maxilla and left maxillary sinus  . KNEE SURGERY    . SHOULDER ARTHROSCOPY W/ ROTATOR CUFF REPAIR Left 09-23-13   per Dr. Delrae Sawyers at [facility] center in [place]     ROS:  As stated in the HPI and negative for all other systems.  PHYSICAL EXAM BP (!) 169/70   Pulse 69   Ht 5\' 5"  (1.651 m)   Wt 264 lb (119.7 kg)   BMI 43.93 kg/m  GENERAL:  Well appearing NECK:  No jugular venous distention, waveform within normal limits, carotid upstroke brisk and symmetric, right carotid bruits, no thyromegaly LUNGS:  Clear to auscultation bilaterally CHEST:   Unremarkable HEART:  PMI not displaced or sustained,S1 and S2 within normal limits, no S3, no S4, no clicks, no rubs, 2 out of 6 soft apical systolic murmur nonradiating, no diastolic murmurs ABD:  Flat, positive bowel sounds normal in frequency in pitch, no bruits, no rebound, no guarding, no midline pulsatile mass, no hepatomegaly, no splenomegaly EXT:  2 plus pulses throughout, no edema, no cyanosis no clubbing  EKG:  NA  Lab Results  Component Value Date   HGBA1C 7.7 (A) 07/26/2018   Lab Results  Component Value Date   CHOL 167 04/20/2018   TRIG 114.0 04/20/2018   HDL 48.60 04/20/2018   LDLCALC 96 04/20/2018     ASSESSMENT AND PLAN  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  She has dyspnea on exertion.  She has significant cardiovascular risk factors.  He is known to have right and left heart catheterization. The patient understands that risks included but are not limited to stroke (1 in 1000), death (1 in 1000), kidney failure [usually temporary] (1 in 500), bleeding (1 in 200), allergic reaction [possibly serious] (1 in 200).  The patient understands and agrees to proceed.   AORTIC STENOSIS:   There was no evidence of stenosis on the recent echo.  She will have pressures as above with her catheterization.   HTN:  Her blood pressure is not controlled.  I am can increase her Accupril to 20 mg twice daily.   OBESITY:   We have discussed weight loss strategies.  She is been very successful at this over the years and will continue to work on this.   DM:  Her A1C is as above.   This is lower than previous.  She will continue meds as listed. bShe was supposed to be taking Victoza but she did not start this.  I refer her back to her primary care physician for follow-up and management.  BRUIT: When she comes back I will order carotid Dopplers.

## 2018-09-09 NOTE — Progress Notes (Signed)
   HPI The patient presents for followup of her known aortic stenosis.   After her last visit I sent her for an echocardiogram and she was found to have a 40 - 45% EF.    There was no evidence of aortic stenosis.  She returns and says that she is continued to have shortness of breath when she gets up and does stop such as walking upstairs.  She has not been as active since she broke her wrist and she has been healing from this.  She still works as a school crossing guard.  She sleeps in a recliner chronically for the last several years.  She is not having any chest pressure, neck or arm discomfort.  She is not having any palpitations, presyncope or syncope.   No Known Allergies  Current Outpatient Medications  Medication Sig Dispense Refill  . albuterol (VENTOLIN HFA) 108 (90 Base) MCG/ACT inhaler USE 2 INHALATIONS EVERY 4 HOURS AS NEEDED FOR WHEEZING 54 g 3  . Alcohol Swabs PADS Use to check blood sugar once a day.  DX E11.319 200 each 1  . aspirin 81 MG tablet Take 81 mg by mouth at bedtime.     . atorvastatin (LIPITOR) 40 MG tablet TAKE 1 TABLET AT BEDTIME 90 tablet 2  . bumetanide (BUMEX) 1 MG tablet TAKE 2 TABLETS AT BEDTIME 180 tablet 3  . cholecalciferol (VITAMIN D) 1000 UNITS tablet Take 2,000 Units by mouth at bedtime.     . DILT-XR 240 MG 24 hr capsule TAKE 1 CAPSULE DAILY 90 capsule 3  . EPIPEN 2-PAK 0.3 MG/0.3ML SOAJ injection INJECT 0.3 MLS (0.3 MG TOTAL) INTO THE MUSCLE ONCE. 1 Device 0  . glimepiride (AMARYL) 4 MG tablet Take 1 tablet (4 mg total) by mouth 2 (two) times daily before a meal. 180 tablet 3  . glucose blood (TRUE METRIX BLOOD GLUCOSE TEST) test strip UAD bid Dx: E11.319 & E11.65 300 each 3  . HYDROcodone-acetaminophen (NORCO/VICODIN) 5-325 MG tablet Take 1 tablet by mouth every 6 (six) hours as needed for moderate pain. 30 tablet 0  . Insulin Pen Needle (CAREFINE PEN NEEDLES) 32G X 4 MM MISC Use with victoza 100 each 1  . metFORMIN (GLUCOPHAGE-XR) 500 MG 24 hr  tablet TAKE 2 TABLETS TWICE A DAY WITH MEALS 360 tablet 3  . metoprolol succinate (TOPROL-XL) 100 MG 24 hr tablet TAKE 1 TABLET (100 MG TOTAL) BY MOUTH DAILY. TAKE WITH OR IMMEDIATELY FOLLOWING A MEAL. 90 tablet 3  . NON FORMULARY     . ONETOUCH DELICA LANCETS 33G MISC Use to check blood sugar twice a day.  DX  E11.319 200 each 1  . quinapril (ACCUPRIL) 20 MG tablet Take 1 tablet (20 mg total) by mouth 2 (two) times daily. 180 tablet 3  . TRADJENTA 5 MG TABS tablet TAKE 1 TABLET BY MOUTH EVERY DAY IN THE MORNING 30 tablet 0   Current Facility-Administered Medications  Medication Dose Route Frequency Provider Last Rate Last Dose  . 0.9 %  sodium chloride infusion  500 mL Intravenous Once Danis, Henry L III, MD        Past Medical History:  Diagnosis Date  . Anemia   . Aortic stenosis    Mild  . Arthritis   . Cataract    both eyes  (cataracts removed)  . Diabetes mellitus    sees Dr. Gherghe   . Gynecological examination    sees Dr. Neal   . Hyperlipidemia   .   Hypertension    sees Dr. Jake Audel Coakley  . Stroke (HCC)    TIA    Past Surgical History:  Procedure Laterality Date  . CARPAL TUNNEL RELEASE  01/22/14   Left Hand   . CATARACT EXTRACTION     Lens surgery  . COLONOSCOPY  02/06/2018   per Dr. Danis, no polyps, repeat in 5 yrs (colon CA in mother)   . excision of benign cyst     from left maxilla and left maxillary sinus  . KNEE SURGERY    . SHOULDER ARTHROSCOPY W/ ROTATOR CUFF REPAIR Left 09-23-13   per Dr. Chason Hayes at [facility] in [place]     ROS:  As stated in the HPI and negative for all other systems.  PHYSICAL EXAM BP (!) 169/70   Pulse 69   Ht 5' 5" (1.651 m)   Wt 264 lb (119.7 kg)   BMI 43.93 kg/m  GENERAL:  Well appearing NECK:  No jugular venous distention, waveform within normal limits, carotid upstroke brisk and symmetric, right carotid bruits, no thyromegaly LUNGS:  Clear to auscultation bilaterally CHEST:   Unremarkable HEART:  PMI not displaced or sustained,S1 and S2 within normal limits, no S3, no S4, no clicks, no rubs, 2 out of 6 soft apical systolic murmur nonradiating, no diastolic murmurs ABD:  Flat, positive bowel sounds normal in frequency in pitch, no bruits, no rebound, no guarding, no midline pulsatile mass, no hepatomegaly, no splenomegaly EXT:  2 plus pulses throughout, no edema, no cyanosis no clubbing  EKG:  NA  Lab Results  Component Value Date   HGBA1C 7.7 (A) 07/26/2018   Lab Results  Component Value Date   CHOL 167 04/20/2018   TRIG 114.0 04/20/2018   HDL 48.60 04/20/2018   LDLCALC 96 04/20/2018     ASSESSMENT AND PLAN  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  She has dyspnea on exertion.  She has significant cardiovascular risk factors.  He is known to have right and left heart catheterization. The patient understands that risks included but are not limited to stroke (1 in 1000), death (1 in 1000), kidney failure [usually temporary] (1 in 500), bleeding (1 in 200), allergic reaction [possibly serious] (1 in 200).  The patient understands and agrees to proceed.   AORTIC STENOSIS:   There was no evidence of stenosis on the recent echo.  She will have pressures as above with her catheterization.   HTN:  Her blood pressure is not controlled.  I am can increase her Accupril to 20 mg twice daily.   OBESITY:   We have discussed weight loss strategies.  She is been very successful at this over the years and will continue to work on this.   DM:  Her A1C is as above.   This is lower than previous.  She will continue meds as listed. bShe was supposed to be taking Victoza but she did not start this.  I refer her back to her primary care physician for follow-up and management.  BRUIT: When she comes back I will order carotid Dopplers.   

## 2018-09-10 ENCOUNTER — Encounter: Payer: Self-pay | Admitting: Cardiology

## 2018-09-10 ENCOUNTER — Ambulatory Visit (INDEPENDENT_AMBULATORY_CARE_PROVIDER_SITE_OTHER): Payer: Medicare Other | Admitting: Cardiology

## 2018-09-10 VITALS — BP 169/70 | HR 69 | Ht 65.0 in | Wt 264.0 lb

## 2018-09-10 DIAGNOSIS — I429 Cardiomyopathy, unspecified: Secondary | ICD-10-CM

## 2018-09-10 DIAGNOSIS — R0602 Shortness of breath: Secondary | ICD-10-CM

## 2018-09-10 DIAGNOSIS — Z01812 Encounter for preprocedural laboratory examination: Secondary | ICD-10-CM

## 2018-09-10 MED ORDER — QUINAPRIL HCL 20 MG PO TABS: 20.0000 mg | ORAL_TABLET | Freq: Two times a day (BID) | ORAL | 3 refills | Status: DC

## 2018-09-10 NOTE — Patient Instructions (Addendum)
Medication Instructions:  INCREASE- Quinapril 20 mg twice a day  If you need a refill on your cardiac medications before your next appointment, please call your pharmacy.  Labwork: Pre Op Labs HERE IN OUR OFFICE AT [facility]  Take the provided lab slips with you to the lab for your blood draw.   You will NOT need to fast   If you have labs (blood work) drawn today and your tests are completely normal, you will receive your results only by: Marland Kitchen MyChart Message (if you have MyChart) OR . A paper copy in the mail If you have any lab test that is abnormal or we need to change your treatment, we will call you to review the results.  Testing/Procedures: Your physician has requested that you have a Right and Left cardiac catheterization. Cardiac catheterization is used to diagnose and/or treat various heart conditions. Doctors may recommend this procedure for a number of different reasons. The most common reason is to evaluate chest pain. Chest pain can be a symptom of coronary artery disease (CAD), and cardiac catheterization can show whether plaque is narrowing or blocking your heart's arteries. This procedure is also used to evaluate the valves, as well as measure the blood flow and oxygen levels in different parts of your heart. For further information please visit [email or web address] Please follow instruction sheet, as given.   Special Instructions:    [facility] MEDICAL GROUP [facility] CARDIOVASCULAR DIVISION [facility] [address] [place] [address] [place] [address] Dept: [phone] Loc: [phone]  Sky Minihan  09/10/2018  You are scheduled for a Cardiac Catheterization on Friday, December 6 with Dr. Peter [place].  1. Please arrive at the [facility] (Main Entrance A) at [facility]: [address] [place], [place] [address] at 5:30 am (This time is two hours before your procedure to ensure your preparation). Free valet parking service is  available.   Special note: Every effort is made to have your procedure done on time. Please understand that emergencies sometimes delay scheduled procedures.  2. Diet: Do not eat solid foods after midnight.  The patient may have clear liquids until 5am upon the day of the procedure.  3. Labs: You will need to have blood drawn on Monday, December 2 at [facility] [address] [place]  Open: 8am - 5pm (Lunch 12:30 - 1:30)   Phone: [phone]. You do not need to be fasting.  4. Medication instructions in preparation for your procedure:   Contrast Allergy: No   Do not take Diabetes Med Glucophage (Metformin) on the day of the procedure and HOLD 48 HOURS AFTER THE PROCEDURE.  On the morning of your procedure, take your Aspirin and any morning medicines NOT listed above.  You may use sips of water.  5. Plan for one night stay--bring personal belongings. 6. Bring a current list of your medications and current insurance cards. 7. You MUST have a responsible person to drive you home. 8. Someone MUST be with you the first 24 hours after you arrive home or your discharge will be delayed. 9. Please wear clothes that are easy to get on and off and wear slip-on shoes.  Thank you for allowing [place] to care for you!   -- [facility]   Follow-Up: . You will need a follow up appointment in After Cath.    At [facility], you and your health needs are our priority.  As part of our continuing mission to provide you  with exceptional heart care, we have created designated Provider Care Teams.  These Care Teams include your primary Cardiologist (physician) and Advanced Practice Providers (APPs -  Physician Assistants and Nurse Practitioners) who all work together to provide you with the care you need, when you need it.   Thank you for choosing [facility] at [facility]!!

## 2018-09-12 LAB — BASIC METABOLIC PANEL
BUN/Creatinine Ratio: 32 — ABNORMAL HIGH (ref 12–28)
BUN: 35 mg/dL — ABNORMAL HIGH (ref 8–27)
CO2: 27 mmol/L (ref 20–29)
Calcium: 10.3 mg/dL (ref 8.7–10.3)
Chloride: 100 mmol/L (ref 96–106)
Creatinine, Ser: 1.09 mg/dL — ABNORMAL HIGH (ref 0.57–1.00)
GFR calc Af Amer: 60 mL/min/{1.73_m2} (ref >59)
GFR calc non Af Amer: 52 mL/min/{1.73_m2} — ABNORMAL LOW (ref >59)
Glucose: 251 mg/dL — ABNORMAL HIGH (ref 65–99)
Potassium: 4.6 mmol/L (ref 3.5–5.2)
Sodium: 140 mmol/L (ref 134–144)

## 2018-09-12 LAB — CBC
Hematocrit: 36.1 % (ref 34.0–46.6)
Hemoglobin: 11.3 g/dL (ref 11.1–15.9)
MCH: 24.6 pg — ABNORMAL LOW (ref 26.6–33.0)
MCHC: 31.3 g/dL — ABNORMAL LOW (ref 31.5–35.7)
MCV: 79 fL (ref 79–97)
Platelets: 273 10*3/uL (ref 150–450)
RBC: 4.59 x10E6/uL (ref 3.77–5.28)
RDW: 15.7 % — ABNORMAL HIGH (ref 12.3–15.4)
WBC: 9.6 10*3/uL (ref 3.4–10.8)

## 2018-09-13 ENCOUNTER — Telehealth: Payer: Self-pay | Admitting: *Deleted

## 2018-09-13 NOTE — Telephone Encounter (Signed)
Pt contacted pre-catheterization scheduled at [facility] for: Friday September 14, 2018 7:30 AM Verified arrival time and place: [facility] Main Entrance A at: 5:30 AM  No solid food after midnight prior to cath, clear liquids until 5 AM day of procedure. Contrast allergy: no  Hold: Metformin-day of procedure and 48 hours post procedure. Tradjenta-AM of procedure. Glimepiride-AM of procedure.  Bumex-PM prior to procedure.  Except hold medications AM meds can be  taken pre-cath with sip of water including: ASA 81 mg  Confirmed patient has responsible person to drive home post procedure and for 24 hours after you arrive home: yes

## 2018-09-14 ENCOUNTER — Ambulatory Visit (HOSPITAL_COMMUNITY)
Admission: RE | Admit: 2018-09-14 | Discharge: 2018-09-14 | Disposition: A | Discharge status: Home / Self Care | Payer: Medicare Other | Source: Ambulatory Visit | Attending: Cardiology | Admitting: Cardiology

## 2018-09-14 ENCOUNTER — Encounter (HOSPITAL_COMMUNITY): Payer: Self-pay | Admitting: *Deleted

## 2018-09-14 ENCOUNTER — Other Ambulatory Visit: Payer: Self-pay

## 2018-09-14 ENCOUNTER — Ambulatory Visit (HOSPITAL_COMMUNITY)
Admission: RE | Disposition: A | Discharge status: Home / Self Care | Payer: Self-pay | Source: Ambulatory Visit | Attending: Cardiology

## 2018-09-14 DIAGNOSIS — Z794 Long term (current) use of insulin: Secondary | ICD-10-CM | POA: Insufficient documentation

## 2018-09-14 DIAGNOSIS — E782 Mixed hyperlipidemia: Secondary | ICD-10-CM | POA: Diagnosis present

## 2018-09-14 DIAGNOSIS — E785 Hyperlipidemia, unspecified: Secondary | ICD-10-CM | POA: Insufficient documentation

## 2018-09-14 DIAGNOSIS — Z6841 Body Mass Index (BMI) 40.0 and over, adult: Secondary | ICD-10-CM | POA: Diagnosis not present

## 2018-09-14 DIAGNOSIS — I420 Dilated cardiomyopathy: Secondary | ICD-10-CM

## 2018-09-14 DIAGNOSIS — Z7982 Long term (current) use of aspirin: Secondary | ICD-10-CM | POA: Insufficient documentation

## 2018-09-14 DIAGNOSIS — I10 Essential (primary) hypertension: Secondary | ICD-10-CM | POA: Diagnosis present

## 2018-09-14 DIAGNOSIS — I2510 Atherosclerotic heart disease of native coronary artery without angina pectoris: Secondary | ICD-10-CM | POA: Diagnosis not present

## 2018-09-14 DIAGNOSIS — I350 Nonrheumatic aortic (valve) stenosis: Secondary | ICD-10-CM

## 2018-09-14 DIAGNOSIS — E119 Type 2 diabetes mellitus without complications: Secondary | ICD-10-CM | POA: Insufficient documentation

## 2018-09-14 DIAGNOSIS — E669 Obesity, unspecified: Secondary | ICD-10-CM | POA: Diagnosis not present

## 2018-09-14 DIAGNOSIS — M1990 Unspecified osteoarthritis, unspecified site: Secondary | ICD-10-CM | POA: Insufficient documentation

## 2018-09-14 DIAGNOSIS — Z8673 Personal history of transient ischemic attack (TIA), and cerebral infarction without residual deficits: Secondary | ICD-10-CM | POA: Insufficient documentation

## 2018-09-14 HISTORY — PX: RIGHT/LEFT HEART CATH AND CORONARY ANGIOGRAPHY: CATH118266

## 2018-09-14 LAB — POCT I-STAT 3, VENOUS BLOOD GAS (G3P V)
Bicarbonate: 25.3 mmol/L (ref 20.0–28.0)
O2 Saturation: 78 %
TCO2: 27 mmol/L (ref 22–32)
pCO2, Ven: 45.2 mmHg (ref 44.0–60.0)
pH, Ven: 7.357 (ref 7.250–7.430)
pO2, Ven: 44 mmHg (ref 32.0–45.0)

## 2018-09-14 LAB — POCT I-STAT 3, ART BLOOD GAS (G3+)
Acid-base deficit: 1 mmol/L (ref 0.0–2.0)
Bicarbonate: 24.4 mmol/L (ref 20.0–28.0)
O2 Saturation: 99 %
TCO2: 26 mmol/L (ref 22–32)
pCO2 arterial: 40.8 mmHg (ref 32.0–48.0)
pH, Arterial: 7.384 (ref 7.350–7.450)
pO2, Arterial: 123 mmHg — ABNORMAL HIGH (ref 83.0–108.0)

## 2018-09-14 LAB — GLUCOSE, CAPILLARY: Glucose-Capillary: 164 mg/dL — ABNORMAL HIGH (ref 70–99)

## 2018-09-14 SURGERY — RIGHT/LEFT HEART CATH AND CORONARY ANGIOGRAPHY
Anesthesia: LOCAL

## 2018-09-14 MED ORDER — SODIUM CHLORIDE 0.9 % IV SOLN: 250.0000 mL | INTRAVENOUS | Status: DC | PRN

## 2018-09-14 MED ORDER — MIDAZOLAM HCL 2 MG/2ML IJ SOLN
INTRAMUSCULAR | Status: DC | PRN
  Administered 2018-09-14: 1 mg via INTRAVENOUS

## 2018-09-14 MED ORDER — HEPARIN (PORCINE) IN NACL 1000-0.9 UT/500ML-% IV SOLN
INTRAVENOUS | Status: AC
  Filled 2018-09-14: qty 1000

## 2018-09-14 MED ORDER — LIDOCAINE HCL (PF) 1 % IJ SOLN
INTRAMUSCULAR | Status: DC | PRN
  Administered 2018-09-14 (×2): 2 mL

## 2018-09-14 MED ORDER — HEPARIN SODIUM (PORCINE) 1000 UNIT/ML IJ SOLN
INTRAMUSCULAR | Status: AC
  Filled 2018-09-14: qty 1

## 2018-09-14 MED ORDER — ACETAMINOPHEN 325 MG PO TABS: 650.0000 mg | ORAL_TABLET | ORAL | Status: DC | PRN

## 2018-09-14 MED ORDER — HYDRALAZINE HCL 20 MG/ML IJ SOLN
INTRAMUSCULAR | Status: DC | PRN
  Administered 2018-09-14 (×2): 10 mg via INTRAVENOUS

## 2018-09-14 MED ORDER — VERAPAMIL HCL 2.5 MG/ML IV SOLN
INTRAVENOUS | Status: AC
  Filled 2018-09-14: qty 2

## 2018-09-14 MED ORDER — SODIUM CHLORIDE 0.9% FLUSH: 3.0000 mL | Freq: Two times a day (BID) | INTRAVENOUS | Status: DC

## 2018-09-14 MED ORDER — SODIUM CHLORIDE 0.9 % WEIGHT BASED INFUSION
3.0000 mL/kg/h | INTRAVENOUS | Status: AC
  Administered 2018-09-14: 3 mL/kg/h via INTRAVENOUS

## 2018-09-14 MED ORDER — ONDANSETRON HCL 4 MG/2ML IJ SOLN: 4.0000 mg | Freq: Four times a day (QID) | INTRAMUSCULAR | Status: DC | PRN

## 2018-09-14 MED ORDER — HEPARIN (PORCINE) IN NACL 1000-0.9 UT/500ML-% IV SOLN
INTRAVENOUS | Status: DC | PRN
  Administered 2018-09-14: 500 mL

## 2018-09-14 MED ORDER — ASPIRIN 81 MG PO CHEW: 81.0000 mg | CHEWABLE_TABLET | ORAL | Status: DC

## 2018-09-14 MED ORDER — SODIUM CHLORIDE 0.9 % WEIGHT BASED INFUSION: 1.0000 mL/kg/h | INTRAVENOUS | Status: AC

## 2018-09-14 MED ORDER — SODIUM CHLORIDE 0.9% FLUSH: 3.0000 mL | INTRAVENOUS | Status: DC | PRN

## 2018-09-14 MED ORDER — FENTANYL CITRATE (PF) 100 MCG/2ML IJ SOLN
INTRAMUSCULAR | Status: AC
  Filled 2018-09-14: qty 2

## 2018-09-14 MED ORDER — LIDOCAINE HCL (PF) 1 % IJ SOLN
INTRAMUSCULAR | Status: AC
  Filled 2018-09-14: qty 30

## 2018-09-14 MED ORDER — HYDRALAZINE HCL 20 MG/ML IJ SOLN
INTRAMUSCULAR | Status: AC
  Filled 2018-09-14: qty 1

## 2018-09-14 MED ORDER — IOHEXOL 350 MG/ML SOLN
INTRAVENOUS | Status: DC | PRN
  Administered 2018-09-14: 80 mL via INTRA_ARTERIAL

## 2018-09-14 MED ORDER — VERAPAMIL HCL 2.5 MG/ML IV SOLN
INTRAVENOUS | Status: DC | PRN
  Administered 2018-09-14: 08:00:00 via INTRA_ARTERIAL

## 2018-09-14 MED ORDER — METFORMIN HCL ER 500 MG PO TB24: ORAL_TABLET | ORAL | 3 refills | Status: DC

## 2018-09-14 MED ORDER — MIDAZOLAM HCL 2 MG/2ML IJ SOLN
INTRAMUSCULAR | Status: AC
  Filled 2018-09-14: qty 2

## 2018-09-14 MED ORDER — FENTANYL CITRATE (PF) 100 MCG/2ML IJ SOLN
INTRAMUSCULAR | Status: DC | PRN
  Administered 2018-09-14 (×2): 25 ug via INTRAVENOUS

## 2018-09-14 MED ORDER — HEPARIN SODIUM (PORCINE) 1000 UNIT/ML IJ SOLN
INTRAMUSCULAR | Status: DC | PRN
  Administered 2018-09-14: 5500 [IU] via INTRAVENOUS

## 2018-09-14 MED ORDER — SODIUM CHLORIDE 0.9 % WEIGHT BASED INFUSION: 1.0000 mL/kg/h | INTRAVENOUS | Status: DC

## 2018-09-14 SURGICAL SUPPLY — 15 items
CATH 5FR JL3.5 JR4 ANG PIG MP (CATHETERS) ×2 IMPLANT
CATH BALLN WEDGE 5F 110CM (CATHETERS) ×2 IMPLANT
DEVICE RAD COMP TR BAND LRG (VASCULAR PRODUCTS) ×2 IMPLANT
GLIDESHEATH SLEND SS 6F .021 (SHEATH) ×2 IMPLANT
GUIDEWIRE .025 260CM (WIRE) ×2 IMPLANT
GUIDEWIRE INQWIRE 1.5J.035X260 (WIRE) ×1 IMPLANT
INQWIRE 1.5J .035X260CM (WIRE) ×2
KIT HEART LEFT (KITS) ×2 IMPLANT
PACK CARDIAC CATHETERIZATION (CUSTOM PROCEDURE TRAY) ×2 IMPLANT
SHEATH GLIDE SLENDER 4/5FR (SHEATH) ×2 IMPLANT
SHEATH PROBE COVER 6X72 (BAG) ×2 IMPLANT
SYR MEDRAD MARK 7 150ML (SYRINGE) ×2 IMPLANT
TRANSDUCER W/STOPCOCK (MISCELLANEOUS) ×2 IMPLANT
TUBING CIL FLEX 10 FLL-RA (TUBING) ×2 IMPLANT
WIRE HI TORQ VERSACORE-J 145CM (WIRE) ×2 IMPLANT

## 2018-09-14 NOTE — Interval H&P Note (Signed)
History and Physical Interval Note:  09/14/2018 7:14 AM  Krista Benjamin  has presented today for surgery, with the diagnosis of sob - cm  The various methods of treatment have been discussed with the patient and family. After consideration of risks, benefits and other options for treatment, the patient has consented to  Procedure(s): RIGHT/LEFT HEART CATH AND CORONARY ANGIOGRAPHY (N/A) as a surgical intervention .  The patient's history has been reviewed, patient examined, no change in status, stable for surgery.  I have reviewed the patient's chart and labs.  Questions were answered to the patient's satisfaction.   Cath Lab Visit (complete for each Cath Lab visit)  Clinical Evaluation Leading to the Procedure:   ACS: No.  Non-ACS:    Anginal Classification: CCS II  Anti-ischemic medical therapy: Maximal Therapy (2 or more classes of medications)  Non-Invasive Test Results: No non-invasive testing performed  Prior CABG: No previous CABG        Theron Arista [facility] 09/14/2018 7:14 AM

## 2018-09-14 NOTE — Discharge Instructions (Signed)

## 2018-09-17 ENCOUNTER — Telehealth: Payer: Self-pay

## 2018-09-17 MED FILL — Fentanyl Citrate Preservative Free (PF) Inj 100 MCG/2ML: INTRAMUSCULAR | Qty: 2 | Status: AC

## 2018-09-17 NOTE — Telephone Encounter (Signed)
Copied from CRM [id number]. Topic: General - Other >> Sep 17, 2018 11:08 AM Gerrianne Scale wrote: Reason for CRM: pt states that she had surgery on Friday and that the cardiologist Dr Swaziland wanted her to charge her blood pressure medicine DILT-XR 240 MG 24 hr capsule

## 2018-09-18 ENCOUNTER — Telehealth: Payer: Self-pay | Admitting: Cardiology

## 2018-09-18 NOTE — Telephone Encounter (Signed)
Pt called again regarding medication. She stated Dr. Swaziland said it was being changed. I found notes on cath procedure report that is suggesting an alternative to diltiazem but that it wasn't ordered. It was recommended for pt to f/u with PCP. Appt scheduled for 09/20/18 11:00am with Dr. Clent Ridges.

## 2018-09-18 NOTE — Telephone Encounter (Signed)
Call and spoke with pt. Pt sts that she was to be scheduled for a f/u appt after her Cardiac cath 09/14/18. Pt sts that she has several questions regarding medications. Offered pt an appt with an APP pt sts that she would rather be seen by Dr.Hochrein. Offered pt two separate appt with Dr.Hochrein, but the pt was not able to make any of them. appt scheduled with Azalee Course, PA for 12/11 @3pm . Pt aware of appt and voiced appreciation for the assistance.

## 2018-09-18 NOTE — Telephone Encounter (Signed)
New Message   Patient is calling because she states she was told that she needed a f/u after her heart cath. I am not showing that information. Please call patient to advise.

## 2018-09-19 ENCOUNTER — Ambulatory Visit (INDEPENDENT_AMBULATORY_CARE_PROVIDER_SITE_OTHER): Payer: Medicare Other | Admitting: Physician Assistant

## 2018-09-19 ENCOUNTER — Encounter: Payer: Self-pay | Admitting: Physician Assistant

## 2018-09-19 VITALS — BP 148/80 | HR 77 | Ht 65.0 in | Wt 259.0 lb

## 2018-09-19 DIAGNOSIS — I428 Other cardiomyopathies: Secondary | ICD-10-CM | POA: Diagnosis not present

## 2018-09-19 DIAGNOSIS — E119 Type 2 diabetes mellitus without complications: Secondary | ICD-10-CM

## 2018-09-19 DIAGNOSIS — I10 Essential (primary) hypertension: Secondary | ICD-10-CM

## 2018-09-19 DIAGNOSIS — E785 Hyperlipidemia, unspecified: Secondary | ICD-10-CM | POA: Diagnosis not present

## 2018-09-19 DIAGNOSIS — Z8673 Personal history of transient ischemic attack (TIA), and cerebral infarction without residual deficits: Secondary | ICD-10-CM

## 2018-09-19 MED ORDER — ISOSORBIDE MONONITRATE ER 30 MG PO TB24: 30.0000 mg | ORAL_TABLET | Freq: Every day | ORAL | 2 refills | Status: DC

## 2018-09-19 MED ORDER — HYDRALAZINE HCL 25 MG PO TABS: 25.0000 mg | ORAL_TABLET | Freq: Two times a day (BID) | ORAL | 1 refills | Status: DC

## 2018-09-19 NOTE — Progress Notes (Signed)
Cardiology Office Note    Date:  09/21/2018   ID:  Elyce [place], DOB 1951-05-25, MRN [id number]  PCP:  Nelwyn Salisbury, MD  Cardiologist: Dr. Antoine Poche  Chief Complaint  Patient presents with  . Follow-up    History of Present Illness:  Miani Albach is a 67 y.o. female with past medical history DM2, HTN, HLD, h/o CVA/TIA who presented for evaluation of aortic stenosis.  Previous echocardiogram obtained on 05/21/2014 showed EF 50 to 55%, moderate LVH, grade 1 DD.  She had a echocardiogram obtained on 08/31/2018 which showed EF 40 to 45%, diffuse hypokinesis, grade 1 DD, atrial septal aneurysm but no evidence of aortic stenosis.  Patient was seen by Dr. Antoine Poche on 09/10/2018, at which time she continued to describe shortness of breath with activity.  Given the recent abnormal echocardiogram, it was recommended for the patient to proceed with left and right heart cath.  Patient underwent the planned procedure on 09/14/2018 which showed EF 35 to 45%, no evidence of aortic stenosis, no mitral valve regurgitation, normal cardiac output, normal right heart pressure, minimal nonobstructive CAD.  Patient presents today for cardiology office visit.  She denies any chest pain or significant shortness of breath.  She does not have significant lower extremity edema, orthopnea or PND.  We discussed the importance of blood pressure control.  She plan to obtain a blood pressure cuff.  I will discontinue her diltiazem given its contraindication in patients with heart failure, I also added Imdur 30 mg daily and hydralazine 25 mg twice daily.  I will bring the patient back in 3 to 4 weeks for up medication up titration, once her heart failure medications maximized, we can potentially consider repeat echocardiogram in 3 months to see if there is any LV improvement.   Past Medical History:  Diagnosis Date  . Anemia   . Aortic stenosis    Mild  . Arthritis   . Cataract    both eyes  (cataracts  removed)  . Diabetes mellitus    sees Dr. Elvera Lennox   . Gynecological examination    sees Dr. Jennette Kettle   . Hyperlipidemia   . Hypertension    sees Dr. Angelina Sheriff  . Stroke [facility])    TIA    Past Surgical History:  Procedure Laterality Date  . CARPAL TUNNEL RELEASE  01/22/14   Left Hand   . CATARACT EXTRACTION     Lens surgery  . COLONOSCOPY  02/06/2018   per Dr. Myrtie Neither, no polyps, repeat in 5 yrs (colon CA in mother)   . excision of benign cyst     from left maxilla and left maxillary sinus  . KNEE SURGERY    . RIGHT/LEFT HEART CATH AND CORONARY ANGIOGRAPHY N/A 09/14/2018   Procedure: RIGHT/LEFT HEART CATH AND CORONARY ANGIOGRAPHY;  Surgeon: Swaziland, Peter M, MD;  Location: [facility] INVASIVE CV LAB;  Service: Cardiovascular;  Laterality: N/A;  . SHOULDER ARTHROSCOPY W/ ROTATOR CUFF REPAIR Left 09-23-13   per Dr. Delrae Sawyers at [facility] center in [place]     Current Medications: Outpatient Medications Prior to Visit  Medication Sig Dispense Refill  . albuterol (VENTOLIN HFA) 108 (90 Base) MCG/ACT inhaler USE 2 INHALATIONS EVERY 4 HOURS AS NEEDED FOR WHEEZING 54 g 3  . Alcohol Swabs PADS Use to check blood sugar once a day.  DX E11.319 200 each 1  . aspirin 81 MG tablet Take 81 mg by mouth at bedtime.     Marland Kitchen atorvastatin (LIPITOR) 40  MG tablet TAKE 1 TABLET AT BEDTIME 90 tablet 2  . bumetanide (BUMEX) 1 MG tablet TAKE 2 TABLETS AT BEDTIME 180 tablet 3  . cholecalciferol (VITAMIN D) 1000 UNITS tablet Take 2,000 Units by mouth at bedtime.     Marland Kitchen EPIPEN 2-PAK 0.3 MG/0.3ML SOAJ injection INJECT 0.3 MLS (0.3 MG TOTAL) INTO THE MUSCLE ONCE. 1 Device 0  . glimepiride (AMARYL) 4 MG tablet Take 1 tablet (4 mg total) by mouth 2 (two) times daily before a meal. 180 tablet 3  . glucose blood (TRUE METRIX BLOOD GLUCOSE TEST) test strip UAD bid Dx: Z61.096 & E11.65 300 each 3  . HYDROcodone-acetaminophen (NORCO/VICODIN) 5-325 MG tablet Take 1 tablet by mouth every 6 (six) hours as needed for  moderate pain. 30 tablet 0  . Insulin Pen Needle (CAREFINE PEN NEEDLES) 32G X 4 MM MISC Use with victoza 100 each 1  . metFORMIN (GLUCOPHAGE-XR) 500 MG 24 hr tablet TAKE 2 TABLETS TWICE A DAY WITH MEALS 360 tablet 3  . metoprolol succinate (TOPROL-XL) 100 MG 24 hr tablet TAKE 1 TABLET (100 MG TOTAL) BY MOUTH DAILY. TAKE WITH OR IMMEDIATELY FOLLOWING A MEAL. 90 tablet 3  . NON FORMULARY     . ONETOUCH DELICA LANCETS 33G MISC Use to check blood sugar twice a day.  DX  E11.319 200 each 1  . quinapril (ACCUPRIL) 20 MG tablet Take 1 tablet (20 mg total) by mouth 2 (two) times daily. 180 tablet 3  . TRADJENTA 5 MG TABS tablet TAKE 1 TABLET BY MOUTH EVERY DAY IN THE MORNING 30 tablet 0  . DILT-XR 240 MG 24 hr capsule TAKE 1 CAPSULE DAILY 90 capsule 3   Facility-Administered Medications Prior to Visit  Medication Dose Route Frequency Provider Last Rate Last Dose  . 0.9 %  sodium chloride infusion  500 mL Intravenous Once Sherrilyn Rist, MD         Allergies:   Patient has no known allergies.   Social History   Socioeconomic History  . Marital status: Married    Spouse name: Not on file  . Number of children: Not on file  . Years of education: Not on file  . Highest education level: Not on file  Occupational History  . Not on file  Social Needs  . Financial resource strain: Not on file  . Food insecurity:    Worry: Not on file    Inability: Not on file  . Transportation needs:    Medical: Not on file    Non-medical: Not on file  Tobacco Use  . Smoking status: Never Smoker  . Smokeless tobacco: Never Used  Substance and Sexual Activity  . Alcohol use: No    Alcohol/week: 0.0 standard drinks  . Drug use: No  . Sexual activity: Not on file  Lifestyle  . Physical activity:    Days per week: Not on file    Minutes per session: Not on file  . Stress: Not on file  Relationships  . Social connections:    Talks on phone: Not on file    Gets together: Not on file    Attends  religious service: Not on file    Active member of club or organization: Not on file    Attends meetings of clubs or organizations: Not on file    Relationship status: Not on file  Other Topics Concern  . Not on file  Social History Narrative  . Not on file  Family History:  The patient's family history includes Cancer in her unknown relative; Colon cancer in her mother; Coronary artery disease in her brother and mother; Diabetes in her unknown relative; Hyperlipidemia in her unknown relative; Hypertension in her unknown relative; Stroke in her unknown relative.   ROS:   Please see the history of present illness.    ROS All other systems reviewed and are negative.   PHYSICAL EXAM:   VS:  BP (!) 148/80 (BP Location: Right Arm, Patient Position: Sitting, Cuff Size: Large)   Pulse 77   Ht 5\' 5"  (1.651 m)   Wt 259 lb (117.5 kg)   BMI 43.10 kg/m    GEN: Well nourished, well developed, in no acute distress  HEENT: normal  Neck: no JVD, carotid bruits, or masses Cardiac: RRR; no murmurs, rubs, or gallops,no edema  Respiratory:  clear to auscultation bilaterally, normal work of breathing GI: soft, nontender, nondistended, + BS MS: no deformity or atrophy  Skin: warm and dry, no rash Neuro:  Alert and Oriented x 3, Strength and sensation are intact Psych: euthymic mood, full affect  Wt Readings from Last 3 Encounters:  09/20/18 261 lb 4 oz (118.5 kg)  09/19/18 259 lb (117.5 kg)  09/14/18 249 lb (112.9 kg)      Studies/Labs Reviewed:   EKG:  EKG is ordered today.  The ekg ordered today demonstrates NSR with LBBB, PAC  Recent Labs: 04/20/2018: ALT 13; TSH 1.03 09/12/2018: BUN 35; Creatinine, Ser 1.09; Hemoglobin 11.3; Platelets 273; Potassium 4.6; Sodium 140   Lipid Panel    Component Value Date/Time   CHOL 167 04/20/2018 0906   TRIG 114.0 04/20/2018 0906   HDL 48.60 04/20/2018 0906   CHOLHDL 3 04/20/2018 0906   VLDL 22.8 04/20/2018 0906   LDLCALC 96 04/20/2018 0906     Additional studies/ records that were reviewed today include:   Echo 08/31/2018 LV EF: 40% -   45%  Study Conclusions  - Left ventricle: The cavity size was normal. Wall thickness was   increased in a pattern of mild LVH. Systolic function was mildly   to moderately reduced. The estimated ejection fraction was in the   range of 40% to 45%. Diffuse hypokinesis. Doppler parameters are   consistent with abnormal left ventricular relaxation (grade 1   diastolic dysfunction). - Mitral valve: Calcified annulus. - Atrial septum: There was an atrial septal aneurysm.  Impressions:  - Mild to moderate global reduction in LV systolic function; mild   diastolic dysfunction; mild LVH.    Cath 09/14/2018  There is moderate left ventricular systolic dysfunction.  LV end diastolic pressure is normal.  The left ventricular ejection fraction is 35-45% by visual estimate.  There is no aortic valve stenosis.  There is no mitral valve regurgitation.   1. Minimal nonobstructive CAD 2. Moderate LV dysfunction. EF estimated at 40%. 3. Normal LV filling pressures 4. Normal right heart pressures 5. Normal cardiac output  Plan: optimize antihypertensive therapy. May want to consider alternative to diltiazem given LV dysfunction.     ASSESSMENT:    1. Nonischemic cardiomyopathy (HCC)   2. Essential hypertension   3. Hyperlipidemia LDL goal <70   4. Controlled type 2 diabetes mellitus without complication, without long-term current use of insulin (HCC)   5. H/O: CVA (cerebrovascular accident)      PLAN:  In order of problems listed above:  1. Nonischemic cardiomyopathy: Discontinue diltiazem given contraindication with LV dysfunction, continue on quinapril.  Add hydralazine/nitrate.  I will bring the patient back in 3 weeks for further medication titration.  Once heart failure medication has been fully titrated, I plan to obtain a 84-month repeat  echocardiogram.  2. Hypertension: Blood pressure elevated today, add hydralazine/nitrate combination  3. Hyperlipidemia: Continue Lipitor  4. DM2: Managed by primary care provider.  5. History of CVA: No recurrence    Medication Adjustments/Labs and Tests Ordered: Current medicines are reviewed at length with the patient today.  Concerns regarding medicines are outlined above.  Medication changes, Labs and Tests ordered today are listed in the Patient Instructions below. Patient Instructions  Medication Instructions:  Discontinued Diltiazem  START Imdur 30 mg daily. START hydralazine 25 mg twice daily. If you need a refill on your cardiac medications before your next appointment, please call your pharmacy.   Lab work: None  If you have labs (blood work) drawn today and your tests are completely normal, you will receive your results only by: Marland Kitchen MyChart Message (if you have MyChart) OR . A paper copy in the mail If you have any lab test that is abnormal or we need to change your treatment, we will call you to review the results.  Testing/Procedures: None  Follow-Up: At [facility], you and your health needs are our priority.  As part of our continuing mission to provide you with exceptional heart care, we have created designated Provider Care Teams.  These Care Teams include your primary Cardiologist (physician) and Advanced Practice Providers (APPs -  Physician Assistants and Nurse Practitioners) who all work together to provide you with the care you need, when you need it. . Follow up with Azalee Course in 3-4 weeks.   Any Other Special Instructions Will Be Listed Below (If Applicable). Keep a BP diary,take BP twice daily bring to follow up visit.       Ramond Dial, [place]  09/21/2018 11:56 PM    [facility] [address] [place], [place], [place]  [address] Phone: [phone]; Fax: [phone]

## 2018-09-19 NOTE — Patient Instructions (Signed)
Medication Instructions:  Discontinued Diltiazem  START Imdur 30 mg daily. START hydralazine 25 mg twice daily. If you need a refill on your cardiac medications before your next appointment, please call your pharmacy.   Lab work: None  If you have labs (blood work) drawn today and your tests are completely normal, you will receive your results only by: Marland Kitchen MyChart Message (if you have MyChart) OR . A paper copy in the mail If you have any lab test that is abnormal or we need to change your treatment, we will call you to review the results.  Testing/Procedures: None  Follow-Up: At [facility], you and your health needs are our priority.  As part of our continuing mission to provide you with exceptional heart care, we have created designated Provider Care Teams.  These Care Teams include your primary Cardiologist (physician) and Advanced Practice Providers (APPs -  Physician Assistants and Nurse Practitioners) who all work together to provide you with the care you need, when you need it. . Follow up with Azalee Course in 3-4 weeks.   Any Other Special Instructions Will Be Listed Below (If Applicable). Keep a BP diary,take BP twice daily bring to follow up visit.

## 2018-09-19 NOTE — Telephone Encounter (Signed)
Dr. Fry please advise. Thanks  

## 2018-09-20 ENCOUNTER — Ambulatory Visit: Payer: Medicare Other | Admitting: Family Medicine

## 2018-09-20 ENCOUNTER — Encounter: Payer: Self-pay | Admitting: Family Medicine

## 2018-09-20 ENCOUNTER — Telehealth: Payer: Self-pay | Admitting: Physician Assistant

## 2018-09-20 VITALS — BP 146/82 | HR 69 | Temp 98.1°F | Wt 261.2 lb

## 2018-09-20 DIAGNOSIS — I10 Essential (primary) hypertension: Secondary | ICD-10-CM

## 2018-09-20 DIAGNOSIS — I350 Nonrheumatic aortic (valve) stenosis: Secondary | ICD-10-CM

## 2018-09-20 DIAGNOSIS — I420 Dilated cardiomyopathy: Secondary | ICD-10-CM

## 2018-09-20 DIAGNOSIS — E1122 Type 2 diabetes mellitus with diabetic chronic kidney disease: Secondary | ICD-10-CM | POA: Diagnosis not present

## 2018-09-20 DIAGNOSIS — N182 Chronic kidney disease, stage 2 (mild): Secondary | ICD-10-CM

## 2018-09-20 NOTE — Progress Notes (Signed)
   Subjective:    Patient ID: Krista Benjamin, female    DOB: July 01, 1951, 67 y.o.   MRN: [id number]  HPI Here to follow up a recent cardiac catheterization. She had been complaining to Dr. Antoine Poche in Cardiology of increasing SOB earlier this fall, and she had an ECHO on 08-31-18. This showed diffuse hypokinesis and an EF of 40-45%. A right and left cath was recommended and she was admitted on 09-14-18 to accomplish this. The cath also showed mildy reduced LV function, minimal valvular disease, and nonobstructive CAD. Her Diltiazem was stopped due to concern about her heart failure, and instead she was started on Imdur 30 mg daily and Hydralazine 25 mg bid. Since then she has felt better with less SOB. Her BP has remained stable. No chest pain.    Review of Systems  Constitutional: Negative.   Respiratory: Negative.   Cardiovascular: Positive for leg swelling. Negative for chest pain and palpitations.  Neurological: Negative.        Objective:   Physical Exam Constitutional:      Appearance: Normal appearance.  Neck:     Musculoskeletal: Neck supple.  Cardiovascular:     Rate and Rhythm: Normal rate and regular rhythm.     Pulses: Normal pulses.     Comments: 2/6 SM  Pulmonary:     Effort: Pulmonary effort is normal. No respiratory distress.     Breath sounds: Normal breath sounds. No wheezing or rales.  Musculoskeletal:     Comments: 2+ edema in both lower legs   Lymphadenopathy:     Cervical: No cervical adenopathy.  Neurological:     Mental Status: She is alert and oriented to person, place, and time. Mental status is at baseline.           Assessment & Plan:  She is feeling better with medications adjustments to treat dilated cardiomyoapthy. Her HTN and edema are stable. Her renal function is stable. She needs a revision surgery to the right thumb per Dr. Mina Marble, and she will have to get cardiac clearance from Cardiology for this. She is scheduled to see Johnella Moloney on  10-08-18.  Gershon Crane, MD

## 2018-09-20 NOTE — Telephone Encounter (Signed)
She was seen OV today  

## 2018-09-20 NOTE — Telephone Encounter (Signed)
° °  [facility] Pre-operative Risk Assessment    Request for surgical clearance:  1. What type of surgery is being performed? Thumb surgery  2. When is this surgery scheduled? TBD  3. What type of clearance is required (medical clearance vs. Pharmacy clearance to hold med vs. Both)? Cardiac clearnce  4. Are there any medications that need to be held prior to surgery and how long? no  5. Practice name and name of physician performing surgery? [facility], Dr. Charlotte Crumb   6. What is your office phone number [phone], Lenora Boys    7.   What is your office fax number [phone]  8.   Anesthesia type (None, local, MAC, general) ? Block    [place] 09/20/2018, 10:56 AM  _________________________________________________________________   (provider comments below)

## 2018-09-21 ENCOUNTER — Encounter: Payer: Self-pay | Admitting: Physician Assistant

## 2018-09-25 NOTE — Telephone Encounter (Signed)
Krista Benjamin, is pt stable for thumb surgery with block only?  Just send result to pre op thanks.

## 2018-10-01 NOTE — Telephone Encounter (Signed)
   Primary Cardiologist: Rollene Rotunda, MD  Chart reviewed as part of pre-operative protocol coverage. Patient was contacted 10/01/2018 in reference to pre-operative risk assessment for pending surgery as outlined below.  Krista Benjamin was last seen on 09/19/2018 by Azalee Course PA-C.  Since that day, Krista Benjamin has done well without significant chest pain. Recent cardiac catheterization is reassuring.   Therefore, based on ACC/AHA guidelines, the patient would be at acceptable risk for the planned procedure without further cardiovascular testing.   I will route this recommendation to the requesting party via Epic fax function and remove from pre-op pool.  Please call with questions.   [name], [place] 10/01/2018, 1:50 PM

## 2018-10-08 ENCOUNTER — Ambulatory Visit (INDEPENDENT_AMBULATORY_CARE_PROVIDER_SITE_OTHER): Payer: Medicare Other | Admitting: Physician Assistant

## 2018-10-08 ENCOUNTER — Encounter: Payer: Self-pay | Admitting: Physician Assistant

## 2018-10-08 VITALS — BP 140/88 | HR 73 | Ht 65.0 in | Wt 252.0 lb

## 2018-10-08 DIAGNOSIS — E785 Hyperlipidemia, unspecified: Secondary | ICD-10-CM | POA: Diagnosis not present

## 2018-10-08 DIAGNOSIS — E119 Type 2 diabetes mellitus without complications: Secondary | ICD-10-CM

## 2018-10-08 DIAGNOSIS — I428 Other cardiomyopathies: Secondary | ICD-10-CM

## 2018-10-08 DIAGNOSIS — I10 Essential (primary) hypertension: Secondary | ICD-10-CM | POA: Diagnosis not present

## 2018-10-08 MED ORDER — HYDRALAZINE HCL 50 MG PO TABS: 50.0000 mg | ORAL_TABLET | Freq: Two times a day (BID) | ORAL | 1 refills | Status: DC

## 2018-10-08 NOTE — Progress Notes (Signed)
Cardiology Office Note    Date:  10/08/2018   ID:  Krista Benjamin, DOB 06/04/1951, MRN [id number]  PCP:  Nelwyn SalisburyFry, Stephen A, MD  Cardiologist:  Dr. Antoine PocheHochrein   Chief Complaint  Patient presents with  . Follow-up    seen for Dr. Antoine PocheHochrein.     History of Present Illness:  Krista Benjamin is a 67 y.o. female with past medical history DM2, HTN, HLD, h/o CVA/TIA.  Previous echocardiogram obtained on 05/21/2014 showed EF 50 to 55%, moderate LVH, grade 1 DD.  She had a echocardiogram obtained on 08/31/2018 which showed EF 40 to 45%, diffuse hypokinesis, grade 1 DD, atrial septal aneurysm but no evidence of aortic stenosis.  Patient was seen by Dr. Antoine PocheHochrein on 09/10/2018, at which time she continued to describe shortness of breath with activity.  Given the recent abnormal echocardiogram, it was recommended for the patient to proceed with left and right heart cath.  Patient underwent the planned procedure on 09/14/2018 which showed EF 35 to 45%, no evidence of aortic stenosis, no mitral valve regurgitation, normal cardiac output, normal right heart pressure, minimal nonobstructive CAD.  It is suspected that her cardiomyopathy was driven by uncontrolled high blood pressure.  I discontinued her diltiazem during the last office visit given its contraindication in patients with cardiomyopathy.  I added Imdur and hydralazine combination.  Patient presents back to the cardiology office today for up titration of her heart failure medication.  She has been tolerating the previous medication okay without any significant dizziness, blurred vision or feeling of passing out.  Her blood pressure remain borderline elevated, I will increase hydralazine to 50 mg twice daily.  This should complete her heart failure regimen.  I plan to have her repeat echocardiogram in 3 months before her next visit with Dr. Antoine PocheHochrein.  Otherwise she appears to be euvolemic on physical exam.   Past Medical History:  Diagnosis Date  .  Anemia   . Aortic stenosis    Mild  . Arthritis   . Cataract    both eyes  (cataracts removed)  . Diabetes mellitus    sees Dr. Elvera LennoxGherghe   . Gynecological examination    sees Dr. Jennette KettleNeal   . Hyperlipidemia   . Hypertension    sees Dr. Angelina SheriffJake Hochrein  . Stroke [facility](HCC)    TIA    Past Surgical History:  Procedure Laterality Date  . CARPAL TUNNEL RELEASE  01/22/14   Left Hand   . CATARACT EXTRACTION     Lens surgery  . COLONOSCOPY  02/06/2018   per Dr. Myrtie Neitheranis, no polyps, repeat in 5 yrs (colon CA in mother)   . excision of benign cyst     from left maxilla and left maxillary sinus  . KNEE SURGERY    . RIGHT/LEFT HEART CATH AND CORONARY ANGIOGRAPHY N/A 09/14/2018   Procedure: RIGHT/LEFT HEART CATH AND CORONARY ANGIOGRAPHY;  Surgeon: SwazilandJordan, Peter M, MD;  [facility];  Service: Cardiovascular;  Laterality: N/A;  . SHOULDER ARTHROSCOPY W/ ROTATOR CUFF REPAIR Left 09-23-13   [facility] Dr[facility] Delrae [facility]hason [facility] [place]     Current Medications: Outpatient Medications Prior to Visit  Medication Sig Dispense Refill  . albuterol (VENTOLIN HFA) 108 (90 Base) MCG/ACT inhaler USE 2 INHALATIONS EVERY 4 HOURS AS NEEDED FOR WHEEZING 54 g 3  . Alcohol Swabs PADS Use to check blood sugar once a day.  DX E11.319 200 each 1  . aspirin 81 MG tablet Take  81 mg by mouth at bedtime.     Marland Kitchen atorvastatin (LIPITOR) 40 MG tablet TAKE 1 TABLET AT BEDTIME 90 tablet 2  . bumetanide (BUMEX) 1 MG tablet TAKE 2 TABLETS AT BEDTIME 180 tablet 3  . cholecalciferol (VITAMIN D) 1000 UNITS tablet Take 2,000 Units by mouth at bedtime.     Marland Kitchen EPIPEN 2-PAK 0.3 MG/0.3ML SOAJ injection INJECT 0.3 MLS (0.3 MG TOTAL) INTO THE MUSCLE ONCE. 1 Device 0  . glimepiride (AMARYL) 4 MG tablet Take 1 tablet (4 mg total) by mouth 2 (two) times daily before a meal. 180 tablet 3  . glucose blood (TRUE METRIX BLOOD GLUCOSE TEST) test strip UAD bid Dx: W09.811 & E11.65 300 each 3  . HYDROcodone-acetaminophen  (NORCO/VICODIN) 5-325 MG tablet Take 1 tablet by mouth every 6 (six) hours as needed for moderate pain. 30 tablet 0  . Insulin Pen Needle (CAREFINE PEN NEEDLES) 32G X 4 MM MISC Use with victoza 100 each 1  . isosorbide mononitrate (IMDUR) 30 MG 24 hr tablet Take 1 tablet (30 mg total) by mouth daily. 30 tablet 2  . metFORMIN (GLUCOPHAGE-XR) 500 MG 24 hr tablet TAKE 2 TABLETS TWICE A DAY WITH MEALS 360 tablet 3  . metoprolol succinate (TOPROL-XL) 100 MG 24 hr tablet TAKE 1 TABLET (100 MG TOTAL) BY MOUTH DAILY. TAKE WITH OR IMMEDIATELY FOLLOWING A MEAL. 90 tablet 3  . NON FORMULARY     . ONETOUCH DELICA LANCETS 33G MISC Use to check blood sugar twice a day.  DX  E11.319 200 each 1  . quinapril (ACCUPRIL) 20 MG tablet Take 1 tablet (20 mg total) by mouth 2 (two) times daily. 180 tablet 3  . TRADJENTA 5 MG TABS tablet TAKE 1 TABLET BY MOUTH EVERY DAY IN THE MORNING 30 tablet 0  . hydrALAZINE (APRESOLINE) 25 MG tablet Take 1 tablet (25 mg total) by mouth 2 (two) times daily. 270 tablet 1   Facility-Administered Medications Prior to Visit  Medication Dose Route Frequency Provider Last Rate Last Dose  . 0.9 %  sodium chloride infusion  500 mL Intravenous Once Sherrilyn Rist, MD         Allergies:   Patient has no known allergies.   Social History   Socioeconomic History  . Marital status: Married    Spouse name: Not on file  . Number of children: Not on file  . Years of education: Not on file  . Highest education level: Not on file  Occupational History  . Not on file  Social Needs  . Financial resource strain: Not on file  . Food insecurity:    Worry: Not on file    Inability: Not on file  . Transportation needs:    Medical: Not on file    Non-medical: Not on file  Tobacco Use  . Smoking status: Never Smoker  . Smokeless tobacco: Never Used  Substance and Sexual Activity  . Alcohol use: No    Alcohol/week: 0.0 standard drinks  . Drug use: No  . Sexual activity: Not on file    Lifestyle  . Physical activity:    Days per week: Not on file    Minutes per session: Not on file  . Stress: Not on file  Relationships  . Social connections:    Talks on phone: Not on file    Gets together: Not on file    Attends religious service: Not on file    Active member of club or organization: Not  on file    Attends meetings of clubs or organizations: Not on file    Relationship status: Not on file  Other Topics Concern  . Not on file  Social History Narrative  . Not on file     Family History:  The patient's family history includes Cancer in her unknown relative; Colon cancer in her mother; Coronary artery disease in her brother and mother; Diabetes in her unknown relative; Hyperlipidemia in her unknown relative; Hypertension in her unknown relative; Stroke in her unknown relative.   ROS:   Please see the history of present illness.    ROS All other systems reviewed and are negative.   PHYSICAL EXAM:   VS:  BP 140/88   Pulse 73   Ht 5\' 5"  (1.651 m)   Wt 252 lb (114.3 kg)   SpO2 96%   BMI 41.93 kg/m    GEN: Well nourished, well developed, in no acute distress  HEENT: normal  Neck: no JVD, carotid bruits, or masses Cardiac: RRR; no murmurs, rubs, or gallops,no edema  Respiratory:  clear to auscultation bilaterally, normal work of breathing GI: soft, nontender, nondistended, + BS MS: no deformity or atrophy  Skin: warm and dry, no rash Neuro:  Alert and Oriented x 3, Strength and sensation are intact Psych: euthymic mood, full affect  Wt Readings from Last 3 Encounters:  10/08/18 252 lb (114.3 kg)  09/20/18 261 lb 4 oz (118.5 kg)  09/19/18 259 lb (117.5 kg)      Studies/Labs Reviewed:   EKG:  EKG is not ordered today.   Recent Labs: 04/20/2018: ALT 13; TSH 1.03 09/12/2018: BUN 35; Creatinine, Ser 1.09; Hemoglobin 11.3; Platelets 273; Potassium 4.6; Sodium 140   Lipid Panel    Component Value Date/Time   CHOL 167 04/20/2018 0906   TRIG 114.0  04/20/2018 0906   HDL 48.60 04/20/2018 0906   CHOLHDL 3 04/20/2018 0906   VLDL 22.8 04/20/2018 0906   LDLCALC 96 04/20/2018 0906    Additional studies/ records that were reviewed today include:   Echo 08/31/2018 LV EF: 40% -   45% Study Conclusions  - Left ventricle: The cavity size was normal. Wall thickness was   increased in a pattern of mild LVH. Systolic function was mildly   to moderately reduced. The estimated ejection fraction was in the   range of 40% to 45%. Diffuse hypokinesis. Doppler parameters are   consistent with abnormal left ventricular relaxation (grade 1   diastolic dysfunction). - Mitral valve: Calcified annulus. - Atrial septum: There was an atrial septal aneurysm.  Impressions:  - Mild to moderate global reduction in LV systolic function; mild   diastolic dysfunction; mild LVH.   Cath 09/14/2018  There is moderate left ventricular systolic dysfunction.  LV end diastolic pressure is normal.  The left ventricular ejection fraction is 35-45% by visual estimate.  There is no aortic valve stenosis.  There is no mitral valve regurgitation.   1. Minimal nonobstructive CAD 2. Moderate LV dysfunction. EF estimated at 40%. 3. Normal LV filling pressures 4. Normal right heart pressures 5. Normal cardiac output  Plan: optimize antihypertensive therapy. May want to consider alternative to diltiazem given LV dysfunction.  ASSESSMENT:    1. Nonischemic cardiomyopathy (HCC)   2. Essential hypertension   3. Hyperlipidemia, unspecified hyperlipidemia type   4. Controlled type 2 diabetes mellitus without complication, without long-term current use of insulin (HCC)      PLAN:  In order of problems listed above:  1. Nonischemic cardiomyopathy: Continue Toprol-XL, quinapril, hydralazine and Imdur.  Increase hydralazine to 50 mg twice daily.  Repeat echocardiogram in 3 months before her next visit with Dr. Antoine Poche  2. Hypertension: Blood pressure  remain borderline high, increase hydralazine to 50 mg twice daily.  3. Hyperlipidemia: Continue Lipitor 40 mg daily.  Last lipid panel obtained in July 2019 showed a total cholesterol 167, triglyceride 140, HDL 48, LDL 96.  4. DM2: Managed by primary care provider.    Medication Adjustments/Labs and Tests Ordered: Current medicines are reviewed at length with the patient today.  Concerns regarding medicines are outlined above.  Medication changes, Labs and Tests ordered today are listed in the Patient Instructions below. Patient Instructions  Medication Instructions:  Increase Hydralazine to 50 mg twice daily. If you need a refill on your cardiac medications before your next appointment, please call your pharmacy.   Testing/Procedures: Echocardiogram (3 months) - Your physician has requested that you have an echocardiogram. Echocardiography is a painless test that uses sound waves to create images of your heart. It provides your doctor with information about the size and shape of your heart and how well your heart's chambers and valves are working. This procedure takes approximately one hour. There are no restrictions for this procedure. This will be performed at our [facility] location - [address].   Follow-Up: At [facility], you and your health needs are our priority.  As part of our continuing mission to provide you with exceptional heart care, we have created designated Provider Care Teams.  These Care Teams include your primary Cardiologist (physician) and Advanced Practice Providers (APPs -  Physician Assistants and Nurse Practitioners) who all work together to provide you with the care you need, when you need it. You will need a follow up appointment in 3 months (AFTER ECHO). You may see Rollene Rotunda, MD or one of the following Advanced Practice Providers on your designated Care Team:   Theodore Demark, PA-C . Joni Reining, DNP, ANP       Signed, Azalee Course, PA  10/08/2018 10:15 AM    [facility] [address] [place], [place], [place]  [address] Phone: [phone]; Fax: [phone]

## 2018-10-08 NOTE — Patient Instructions (Signed)
Medication Instructions:  Increase Hydralazine to 50 mg twice daily. If you need a refill on your cardiac medications before your next appointment, please call your pharmacy.   Testing/Procedures: Echocardiogram (3 months) - Your physician has requested that you have an echocardiogram. Echocardiography is a painless test that uses sound waves to create images of your heart. It provides your doctor with information about the size and shape of your heart and how well your heart's chambers and valves are working. This procedure takes approximately one hour. There are no restrictions for this procedure. This will be performed at our [facility] location - [address].   Follow-Up: At [facility], you and your health needs are our priority.  As part of our continuing mission to provide you with exceptional heart care, we have created designated Provider Care Teams.  These Care Teams include your primary Cardiologist (physician) and Advanced Practice Providers (APPs -  Physician Assistants and Nurse Practitioners) who all work together to provide you with the care you need, when you need it. You will need a follow up appointment in 3 months (AFTER ECHO). You may see Rollene Rotunda, MD or one of the following Advanced Practice Providers on your designated Care Team:   Theodore Demark, PA-C . Joni Reining, DNP, ANP

## 2018-10-17 ENCOUNTER — Other Ambulatory Visit: Payer: Self-pay

## 2018-10-17 ENCOUNTER — Other Ambulatory Visit: Payer: Self-pay | Admitting: Internal Medicine

## 2018-10-17 MED ORDER — ALCOHOL SWABS PADS: MEDICATED_PAD | 1 refills | Status: DC

## 2018-11-09 ENCOUNTER — Encounter: Payer: Self-pay | Admitting: Internal Medicine

## 2018-11-09 ENCOUNTER — Ambulatory Visit: Payer: Medicare Other | Admitting: Internal Medicine

## 2018-11-09 VITALS — BP 142/82 | HR 69 | Ht 65.0 in | Wt 253.0 lb

## 2018-11-09 DIAGNOSIS — E11319 Type 2 diabetes mellitus with unspecified diabetic retinopathy without macular edema: Secondary | ICD-10-CM | POA: Diagnosis not present

## 2018-11-09 DIAGNOSIS — E782 Mixed hyperlipidemia: Secondary | ICD-10-CM | POA: Diagnosis not present

## 2018-11-09 DIAGNOSIS — E1165 Type 2 diabetes mellitus with hyperglycemia: Secondary | ICD-10-CM | POA: Diagnosis not present

## 2018-11-09 DIAGNOSIS — E669 Obesity, unspecified: Secondary | ICD-10-CM

## 2018-11-09 DIAGNOSIS — IMO0002 Reserved for concepts with insufficient information to code with codable children: Secondary | ICD-10-CM

## 2018-11-09 LAB — POCT GLYCOSYLATED HEMOGLOBIN (HGB A1C): Hemoglobin A1C: 7.1 % — AB (ref 4.0–5.6)

## 2018-11-09 MED ORDER — LINAGLIPTIN 5 MG PO TABS: ORAL_TABLET | ORAL | 3 refills | Status: DC

## 2018-11-09 MED ORDER — GLIMEPIRIDE 4 MG PO TABS: 4.0000 mg | ORAL_TABLET | Freq: Every day | ORAL | 3 refills | Status: DC

## 2018-11-09 NOTE — Progress Notes (Signed)
Patient ID: Krista Benjamin, female   DOB: 03/09/1951, 68 y.o.   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  Last visit 3.5 mo ago. She has [employer] + [employer].   Last hemoglobin A1c:  Lab Results  Component Value Date   HGBA1C 7.7 (A) 07/26/2018   HGBA1C 9.3 (A) 04/10/2018   HGBA1C 8.1 11/07/2017  Prev. 5.9% on 12/2012, previously 6.1%.  Pt is on a regimen of: - Metformin ER 1000 mg 2x a day with meals - Amaryl 4 mg 2x a day before meals - Tradjenta 5 mg before b'fast Reviewed previous medications tried: Rec'd GLP1 agonist >> Victoza covered >> she was afraid of SEs.+ We tried Jardiance 10 mg daily in am - added 04/2017 >> yeast inf >> stopped We tried Invokana 100 mg in am >> stopped after 3 days >> nausea, diarrhea We stopped Lantus 10 units qhs. We stopped Actos 45 mg (was on it since 07/2001) Metformin 1000 mg bid >> stopped b/c Nausea/Diarrhea She was on Novolog 8 units tid ac, when sugars >140   Pt checks her sugars 1-2x a day: - am: 97-171 >> 133, 156, 207 >> 105-191 >> 51-138, 151 - 2h after b'fast:  111-146, 276 >> n/c >> 148 >> 88-203, 238 - lunch: 140, 240 >> n/c >> 135-169 >> 183 >> 71, 82 - 2h after lunch:  201 >> 303 >> n/c >> 201 >> 104 - dinner: 114 >> 137, 207 >> 62-187 >> n/c >> 95-148, 201 - 2h after dinner: 108, 170-286 >> 76-208 >> 82-167, 249 - bedtime: see above >> 180-226 (grapes) >> 100-158, 180 - night:   76, 137 >> n/c >> 184 Lowest sugar:  99 >> 51; she has hypoglycemia awareness in the 70s.  Highest sugar: 300 (at the time of her fx) >> 249.  Glucometer: One Touch Verio Flex  Pt's meals are:  - Breakfast: smoothies >> belvita 1 package - Lunch: meat + veggies - mostly skips - Dinner: meat + veggies - Snacks: 1-2, fruit, yoghurt  She lost 80 lbs since 2012 >> was able to come off insulin. She saw  Oran ReinLaura Jobe (nutritionist) on 01/01/2015.  -+ Mild CKD. Latest BUN/creatinine was:  Lab Results  Component Value Date   BUN 35 (H) 09/12/2018   Lab Results  Component Value Date   CREATININE 1.09 (H) 09/12/2018  On quinapril.  -+ HL.  Latest lipid profile: Lab Results  Component Value Date   CHOL 167 04/20/2018   HDL 48.60 04/20/2018   LDLCALC 96 04/20/2018   TRIG 114.0 04/20/2018   CHOLHDL 3 04/20/2018  On Lipitor. - last eye exam: 03/2018: Background DR.  + h/o B cataract sx. - no numbness and tingling in her legs.  She also has a history of HTN, iron deficiency anemia-on iron therapy, aortic valve disease/murmur, left ventricular hypertrophy per EKG, obesity, multinodular goiter, fibroids.  She had shingles in 11/2014 and 02/2016.  She fell and broke R wrist 06/2018.  She started ISDN, Hydralazine.  ROS: Constitutional: no weight gain/+ weight loss, no fatigue, no subjective hyperthermia, no subjective hypothermia Eyes: no blurry vision, no xerophthalmia ENT: no sore throat, no nodules palpated in neck, no dysphagia, no odynophagia, no hoarseness Cardiovascular: no CP/no SOB/no palpitations/no leg swelling Respiratory: no cough/no SOB/no wheezing Gastrointestinal: no N/no V/no D/no C/no acid reflux Musculoskeletal: no muscle aches/no joint aches Skin:  no rashes, no hair loss Neurological: no tremors/no numbness/no tingling/no dizziness  I reviewed pt's medications, allergies, PMH, social hx, family hx, and changes were documented in the history of present illness. Otherwise, unchanged from my initial visit note.  Past Medical History:  Diagnosis Date  . Anemia   . Aortic stenosis    Mild  . Arthritis   . Cataract    both eyes  (cataracts removed)  . Diabetes mellitus    sees Dr. Elvera Lennox   . Gynecological examination    sees Dr. Jennette Kettle   . Hyperlipidemia   . Hypertension    sees Dr. Angelina Sheriff  . Stroke [facility])    TIA   Past Surgical History:  Procedure  Laterality Date  . CARPAL TUNNEL RELEASE  01/22/14   Left Hand   . CATARACT EXTRACTION     Lens surgery  . COLONOSCOPY  02/06/2018   per Dr. Myrtie Neither, no polyps, repeat in 5 yrs (colon CA in mother)   . excision of benign cyst     from left maxilla and left maxillary sinus  . KNEE SURGERY    . RIGHT/LEFT HEART CATH AND CORONARY ANGIOGRAPHY N/A 09/14/2018   Procedure: RIGHT/LEFT HEART CATH AND CORONARY ANGIOGRAPHY;  Surgeon: Swazil[place], Peter M, MD;  Location: [facility] INVASIVE CV LAB;  Service: Cardiovascular;  Laterality: N/A;  . SHOULDER ARTHROSCOPY W/ ROTATOR CUFF REPAIR Left 09-23-13   per Dr. Delrae Sawyers at [facility] center in [place]    Social History   Socioeconomic History  . Marital status: Married    Spouse name: Not on file  . Number of children: Not on file  . Years of education: Not on file  . Highest education level: Not on file  Occupational History  . Not on file  Social Needs  . Financial resource strain: Not on file  . Food insecurity:    Worry: Not on file    Inability: Not on file  . Transportation needs:    Medical: Not on file    Non-medical: Not on file  Tobacco Use  . Smoking status: Never Smoker  . Smokeless tobacco: Never Used  Substance and Sexual Activity  . Alcohol use: No    Alcohol/week: 0.0 standard drinks  . Drug use: No  . Sexual activity: Not on file  Lifestyle  . Physical activity:    Days per week: Not on file    Minutes per session: Not on file  . Stress: Not on file  Relationships  . Social connections:    Talks on phone: Not on file    Gets together: Not on file    Attends religious service: Not on file    Active member of club or organization: Not on file    Attends meetings of clubs or organizations: Not on file    Relationship status: Not on file  . Intimate partner violence:    Fear of current or ex partner: Not on file    Emotionally abused: Not on file    Physically abused: Not on file    Forced sexual activity: Not  on file  Other Topics Concern  . Not on file  Social History Narrative  . Not on file   Current Outpatient Medications on File Prior to Visit  Medication Sig Dispense Refill  . albuterol (VENTOLIN HFA) 108 (90 Base) MCG/ACT inhaler USE 2 INHALATIONS EVERY 4 HOURS AS NEEDED FOR WHEEZING 54 g 3  . Alcohol Swabs PADS Use to check blood sugar  once a day.  DX E11.319 200 each 1  . aspirin 81 MG tablet Take 81 mg by mouth at bedtime.     Marland Kitchen atorvastatin (LIPITOR) 40 MG tablet TAKE 1 TABLET AT BEDTIME 90 tablet 2  . bumetanide (BUMEX) 1 MG tablet TAKE 2 TABLETS AT BEDTIME 180 tablet 3  . cholecalciferol (VITAMIN D) 1000 UNITS tablet Take 2,000 Units by mouth at bedtime.     Marland Kitchen EPIPEN 2-PAK 0.3 MG/0.3ML SOAJ injection INJECT 0.3 MLS (0.3 MG TOTAL) INTO THE MUSCLE ONCE. 1 Device 0  . glimepiride (AMARYL) 4 MG tablet Take 1 tablet (4 mg total) by mouth 2 (two) times daily before a meal. 180 tablet 3  . glucose blood test strip USE TWICE A DAY 100 each 12  . hydrALAZINE (APRESOLINE) 50 MG tablet Take 1 tablet (50 mg total) by mouth 2 (two) times daily. 270 tablet 1  . HYDROcodone-acetaminophen (NORCO/VICODIN) 5-325 MG tablet Take 1 tablet by mouth every 6 (six) hours as needed for moderate pain. 30 tablet 0  . Insulin Pen Needle (CAREFINE PEN NEEDLES) 32G X 4 MM MISC Use with victoza 100 each 1  . isosorbide mononitrate (IMDUR) 30 MG 24 hr tablet Take 1 tablet (30 mg total) by mouth daily. 30 tablet 2  . metFORMIN (GLUCOPHAGE-XR) 500 MG 24 hr tablet TAKE 2 TABLETS TWICE A DAY WITH MEALS 360 tablet 3  . metoprolol succinate (TOPROL-XL) 100 MG 24 hr tablet TAKE 1 TABLET (100 MG TOTAL) BY MOUTH DAILY. TAKE WITH OR IMMEDIATELY FOLLOWING A MEAL. 90 tablet 3  . NON FORMULARY     . ONETOUCH DELICA LANCETS 33G MISC Use to check blood sugar twice a day.  DX  E11.319 200 each 1  . quinapril (ACCUPRIL) 20 MG tablet Take 1 tablet (20 mg total) by mouth 2 (two) times daily. 180 tablet 3  . TRADJENTA 5 MG TABS tablet  TAKE 1 TABLET BY MOUTH EVERY DAY IN THE MORNING 30 tablet 0   Current Facility-Administered Medications on File Prior to Visit  Medication Dose Route Frequency Provider Last Rate Last Dose  . 0.9 %  sodium chloride infusion  500 mL Intravenous Once Sherrilyn Rist, MD       No Known Allergies Family History  Problem Relation Age of Onset  . Coronary artery disease Brother   . Coronary artery disease Mother   . Colon cancer Mother   . Cancer Unknown        fhx  . Diabetes Unknown        fhx  . Hyperlipidemia Unknown        fhx  . Hypertension Unknown        fhx  . Stroke Unknown        fhx  . Colon polyps Neg Hx   . Esophageal cancer Neg Hx   . Rectal cancer Neg Hx   . Stomach cancer Neg Hx     PE: BP (!) 142/82   Pulse 69   Ht 5\' 5"  (1.651 m)   Wt 253 lb (114.8 kg)   SpO2 96%   BMI 42.10 kg/m  Body mass index is 42.1 kg/m.  Wt Readings from Last 3 Encounters:  11/09/18 253 lb (114.8 kg)  10/08/18 252 lb (114.3 kg)  09/20/18 261 lb 4 oz (118.5 kg)   Constitutional: overweight, in NAD Eyes: PERRLA, EOMI, no exophthalmos ENT: moist mucous membranes, no thyromegaly, no cervical lymphadenopathy Cardiovascular: RRR, No MRG, + LE edema Respiratory: CTA  B Gastrointestinal: abdomen soft, NT, ND, BS+ Musculoskeletal: no deformities, strength intact in all 4 Skin: moist, warm, no rashes Neurological: no tremor with outstretched hands, DTR normal in all 4  ASSESSMENT: 1. DM2, non-insulin-dependent, uncontrolled, with complications - CKD stage 2 - background DR  2.  Obesity  3. HL  PLAN:  1.  Patient with longstanding, uncontrolled, type 2 diabetes, on metformin, DPP 4 inhibitor, and sulfonylurea.  We tried SGLT2 inhibitors but she could not tolerate them due to yeast infections.  We discussed in the past about starting the GLP-1 receptor agonist.  Her insurance did not cover weekly formulations, only Victoza.  She did not start this before last visit as she was  afraid of side effects.  However, sugars were improved at that time, with only above goal CBGs at bedtime.  We discussed about reducing snacks and reducing fruit between meals.  We also increase the metformin at that time and I advised her to contact me to tell me if sugars do not  improve on this regimen.  In that case, we needed to add Victoza -reassured her regarding possible side effects.  She did not get in touch with me since then, though -At this visit, sugars are improved at all times of the day.  She has occasional spikes into the 200s with dietary indiscretions or if she forgets her medicines.  This does not happen often.  In the morning, however, she has mostly low blood sugars between 50s and 70s and occasionally higher.  I suspect that this is due to the dose of Amaryl at night.  Upon questioning, she occasionally takes Amaryl at bedtime if she forgets to take it before dinner.  I advised her to not do this since this practice is conducive to low blood sugars overnight and in the morning.  However, at this visit, we will stop the evening Amaryl completely but will keep the Amaryl dose in the morning. - I advised her to:  Patient Instructions  Please continue: - Metformin ER 1000 mg 2x a day with meals - Amaryl 4 mg before b'fast but stop the pre-dinner one - Tradjenta 5 mg before b'fast  Please return in 3-4 months with your sugar log.   - today, HbA1c is 7.1% (lower) - continue checking sugars at different times of the day - check 1x a day, rotating checks - advised for yearly eye exams >> she is UTD - Return to clinic in 3-4 mo with sugar log      2.  Obesity - lost 14 lbs since last OV - she is walking now - not going to the gym - pain in finger  3. HL - Reviewed latest lipid panel from 04/2018: All fractions at goal Lab Results  Component Value Date   CHOL 167 04/20/2018   HDL 48.60 04/20/2018   LDLCALC 96 04/20/2018   TRIG 114.0 04/20/2018   CHOLHDL 3 04/20/2018  -  Continues Lipitor without side effects.  Carlus Pavlov, MD PhD [facility]

## 2018-11-09 NOTE — Addendum Note (Signed)
Addended by: Darliss Ridgel I on: 11/09/2018 03:02 PM   Modules accepted: Orders

## 2018-11-09 NOTE — Patient Instructions (Addendum)
Please continue: - Metformin ER 1000 mg 2x a day with meals - Amaryl 4 mg before b'fast but stop the pre-dinner one - Tradjenta 5 mg before b'fast  Please return in 3-4 months with your sugar log.

## 2018-11-10 ENCOUNTER — Other Ambulatory Visit: Payer: Self-pay | Admitting: Family Medicine

## 2018-11-22 ENCOUNTER — Other Ambulatory Visit: Payer: Self-pay | Admitting: Family Medicine

## 2018-11-22 NOTE — Telephone Encounter (Signed)
Dr. Fry please advise. Thanks  

## 2018-11-26 ENCOUNTER — Ambulatory Visit: Payer: Medicare Other | Admitting: Family Medicine

## 2018-11-26 ENCOUNTER — Encounter: Payer: Self-pay | Admitting: Family Medicine

## 2018-11-26 VITALS — BP 144/82 | HR 80 | Temp 99.6°F | Wt 262.1 lb

## 2018-11-26 DIAGNOSIS — R52 Pain, unspecified: Secondary | ICD-10-CM | POA: Diagnosis not present

## 2018-11-26 LAB — POCT INFLUENZA A/B
Influenza A, POC: NEGATIVE
Influenza B, POC: NEGATIVE

## 2018-11-26 MED ORDER — ALBUTEROL SULFATE (2.5 MG/3ML) 0.083% IN NEBU: 2.5000 mg | INHALATION_SOLUTION | RESPIRATORY_TRACT | 5 refills | Status: DC | PRN

## 2018-11-26 MED ORDER — AZITHROMYCIN 250 MG PO TABS: ORAL_TABLET | ORAL | 0 refills | Status: DC

## 2018-11-26 MED ORDER — HYDROCODONE-HOMATROPINE 5-1.5 MG/5ML PO SYRP: 5.0000 mL | ORAL_SOLUTION | ORAL | 0 refills | Status: DC | PRN

## 2018-11-26 NOTE — Progress Notes (Addendum)
   Subjective:    Patient ID: Krista Benjamin, female    DOB: 1950/12/26, 68 y.o.   MRN: [id number]  HPI Here for 4 days of chest tightness and coughing up yellow sputum. No fever. On Allegra and Mucinex.    Review of Systems  Constitutional: Negative.   HENT: Negative.   Eyes: Negative.   Respiratory: Positive for cough and chest tightness.        Objective:   Physical Exam Constitutional:      Appearance: Normal appearance. She is not ill-appearing.  HENT:     Right Ear: Tympanic membrane and ear canal normal.     Left Ear: Tympanic membrane and ear canal normal.     Nose: Nose normal.     Mouth/Throat:     Pharynx: Oropharynx is clear.  Eyes:     Conjunctiva/sclera: Conjunctivae normal.  Pulmonary:     Effort: Pulmonary effort is normal. No respiratory distress.     Breath sounds: Rhonchi present. No wheezing or rales.  Neurological:     Mental Status: She is alert.           Assessment & Plan:  Bronchitis, treat with a Zpack. I also wrote her an order to obtain a nebulizer to use albuterol at home as needed.    Gershon Crane, MD

## 2018-11-26 NOTE — Addendum Note (Signed)
Addended by: Gershon Crane A on: 11/26/2018 05:02 PM   Modules accepted: Orders

## 2018-11-27 ENCOUNTER — Telehealth: Payer: Self-pay

## 2018-11-27 MED ORDER — ALBUTEROL SULFATE (2.5 MG/3ML) 0.083% IN NEBU: 2.5000 mg | INHALATION_SOLUTION | RESPIRATORY_TRACT | 3 refills | Status: DC | PRN

## 2018-11-27 MED ORDER — ALBUTEROL SULFATE HFA 108 (90 BASE) MCG/ACT IN AERS: INHALATION_SPRAY | RESPIRATORY_TRACT | 3 refills | Status: DC

## 2018-11-27 NOTE — Telephone Encounter (Signed)
Copied from CRM [id number]. Topic: General - Other >> Nov 27, 2018  9:46 AM Herby Abraham C wrote: Reason for CRM: pt called in stating that her nebulizer and also her albuterol (PROVENTIL) (2.5 MG/3ML) 0.083% nebulizer solution and also albuterol (VENTOLIN HFA) 108 (90 Base) MCG/ACT inhaler  need to be sent via  [facility] - [place], [place] - [address] [phone] (Phone) [phone] (Fax)  Please assist. Pt would like a call when this has been completed.   Thanks

## 2018-11-29 ENCOUNTER — Telehealth: Payer: Self-pay

## 2018-11-29 NOTE — Telephone Encounter (Signed)
Copied from CRM [id number]. Topic: General - Other >> Nov 28, 2018  1:43 PM Percival Spanish wrote:  Marchelle Folks with [employer] call to say pt has an order in her hand for nebulizer machine. Marchelle Folks said for insurance purposes she will need  a respiratory diagnosis and it need to listed in the chart notes that a nebulizer was ordered    [phone]

## 2018-11-30 NOTE — Telephone Encounter (Signed)
I called [employer] and they are aware of updated Ov note and they will let Marchelle Folks know this.

## 2018-11-30 NOTE — Telephone Encounter (Signed)
Pt calling to check status on Dr. Clent Ridges getting back to her. She states if this will not be approved she is needing to know so she can go buy one out of pocket.

## 2018-11-30 NOTE — Telephone Encounter (Signed)
I added an addendum to her chart about the visit that day, and this documents that I wanted her to have an at home nebulizer.

## 2018-11-30 NOTE — Telephone Encounter (Signed)
Dr. Fry please advise. Thanks  

## 2018-12-02 ENCOUNTER — Other Ambulatory Visit: Payer: Self-pay | Admitting: Family Medicine

## 2018-12-04 ENCOUNTER — Other Ambulatory Visit: Payer: Self-pay | Admitting: Internal Medicine

## 2018-12-07 ENCOUNTER — Encounter: Payer: Self-pay | Admitting: Family Medicine

## 2018-12-07 ENCOUNTER — Ambulatory Visit: Payer: Medicare Other | Admitting: Family Medicine

## 2018-12-07 VITALS — BP 146/82 | HR 81 | Temp 98.8°F | Wt 267.1 lb

## 2018-12-07 DIAGNOSIS — R040 Epistaxis: Secondary | ICD-10-CM

## 2018-12-07 MED ORDER — MUPIROCIN 2 % EX OINT: 1.0000 "application " | TOPICAL_OINTMENT | Freq: Two times a day (BID) | CUTANEOUS | 1 refills | Status: DC

## 2018-12-07 NOTE — Progress Notes (Signed)
   Subjective:    Patient ID: Krista Benjamin, female    DOB: [birth year], 68 y.o.   MRN: [id number]  HPI Here for 2 days of nosebleeds from the right nostril. No recent trauma. She is trying her best to not blow her nose.    Review of Systems  Constitutional: Negative.   HENT: Positive for congestion, nosebleeds and postnasal drip. Negative for sinus pressure, sinus pain and sore throat.   Eyes: Negative.   Respiratory: Negative.   Hematological: Does not bruise/bleed easily.       Objective:   Physical Exam Constitutional:      Appearance: Normal appearance.  HENT:     Right Ear: Tympanic membrane and ear canal normal.     Left Ear: Tympanic membrane and ear canal normal.     Nose:     Comments: The right nostril has a small clot in place near the opening    Mouth/Throat:     Pharynx: Oropharynx is clear.  Eyes:     Conjunctiva/sclera: Conjunctivae normal.  Pulmonary:     Effort: Pulmonary effort is normal.     Breath sounds: Normal breath sounds.  Lymphadenopathy:     Cervical: No cervical adenopathy.  Neurological:     Mental Status: She is alert.           Assessment & Plan:  Nosebleeds. She will use saline nasal sprays QID and apply Mupiricin ointment BID. Recheck prn. Gershon Crane, MD

## 2018-12-14 ENCOUNTER — Other Ambulatory Visit: Payer: Self-pay | Admitting: Family Medicine

## 2018-12-24 ENCOUNTER — Other Ambulatory Visit: Payer: Self-pay | Admitting: Physician Assistant

## 2018-12-24 MED ORDER — ISOSORBIDE MONONITRATE ER 30 MG PO TB24: 30.0000 mg | ORAL_TABLET | Freq: Every day | ORAL | 2 refills | Status: DC

## 2018-12-24 NOTE — Telephone Encounter (Signed)
New message    *STAT* If patient is at the pharmacy, call can be transferred to refill team.   1. Which medications need to be refilled? (please list name of each medication and dose if known) isosorbide mononitrate (IMDUR) 30 MG 24 hr tablet  2. Which pharmacy/location (including street and city if local pharmacy) is medication to be sent to?[id number] - [place], [place] - [address]  3. Do they need a 30 day or 90 day supply?90

## 2018-12-26 ENCOUNTER — Encounter: Payer: Self-pay | Admitting: Cardiology

## 2018-12-31 ENCOUNTER — Other Ambulatory Visit (HOSPITAL_COMMUNITY): Payer: Medicare Other

## 2018-12-31 ENCOUNTER — Telehealth: Payer: Self-pay | Admitting: Cardiology

## 2018-12-31 NOTE — Telephone Encounter (Signed)
   Reason for call: Rescheduling echocardiogram elective and rescheduling Dr. Antoine Poche office appointment for Friday, 01/04/2019  Spoke with patient, she is currently doing well without any shortness of breath fevers cough chest pain.  Ambulating well.  Her prior echocardiogram had ejection fraction of 40 to 45%.  Medications had been changed in late December and she has been stable since then.  In light of the coronavirus situation, we have elected to postpone both her echocardiogram as well as office appointment with Dr. Antoine Poche.  She knows to contact us if any worrisome symptoms do develop.  I think would be reasonable to postpone this echocardiogram reassessment for greater than 12 weeks if needed.  It would be nice for her to possibly have an office appointment with Dr. Antoine Poche, level 2 in 6 to 12 weeks.  Donato Schultz, MD

## 2019-01-01 NOTE — Telephone Encounter (Signed)
Agree with this.  Please make sure that she is in the call back list as suggested by Dr. Anne Fu.

## 2019-01-03 ENCOUNTER — Ambulatory Visit: Payer: Medicare Other | Admitting: Cardiology

## 2019-01-04 ENCOUNTER — Ambulatory Visit (INDEPENDENT_AMBULATORY_CARE_PROVIDER_SITE_OTHER): Payer: Medicare Other

## 2019-01-04 ENCOUNTER — Encounter: Payer: Self-pay | Admitting: Family Medicine

## 2019-01-04 ENCOUNTER — Ambulatory Visit: Payer: Medicare Other | Admitting: Family Medicine

## 2019-01-04 ENCOUNTER — Other Ambulatory Visit: Payer: Self-pay

## 2019-01-04 VITALS — BP 144/80 | HR 69 | Temp 97.8°F | Ht 65.0 in

## 2019-01-04 DIAGNOSIS — S161XXA Strain of muscle, fascia and tendon at neck level, initial encounter: Secondary | ICD-10-CM

## 2019-01-04 MED ORDER — CYCLOBENZAPRINE HCL 10 MG PO TABS: 10.0000 mg | ORAL_TABLET | Freq: Three times a day (TID) | ORAL | 2 refills | Status: DC | PRN

## 2019-01-04 MED ORDER — NAPROXEN 500 MG PO TABS: 500.0000 mg | ORAL_TABLET | Freq: Two times a day (BID) | ORAL | 2 refills | Status: DC
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
## 2019-01-08 ENCOUNTER — Telehealth: Payer: Self-pay

## 2019-01-08 NOTE — Telephone Encounter (Signed)
PA for cyclobenzaprine (FLEXERIL) 10 MG tablet has been sent to cover my meds.  Key: [id number] - PA Case ID: [id number] - Rx #: [id number]

## 2019-01-09 ENCOUNTER — Telehealth: Payer: Self-pay | Admitting: *Deleted

## 2019-01-09 DIAGNOSIS — S161XXD Strain of muscle, fascia and tendon at neck level, subsequent encounter: Secondary | ICD-10-CM

## 2019-01-09 NOTE — Telephone Encounter (Signed)
Attempted to call the pt but the VM is full.  Will try back later.  
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
## 2019-01-15 NOTE — Telephone Encounter (Signed)
Patient returned call and would like to discuss, please advise

## 2019-01-15 NOTE — Telephone Encounter (Signed)
PA has been approved

## 2019-01-17 NOTE — Telephone Encounter (Signed)
The referral was done  

## 2019-01-17 NOTE — Telephone Encounter (Signed)
Patient called to say that she would like to be referred to an Orthopedic doctor. Per patient she is and has been experiencing headache and pain radiates from back of head nape of the neck and its not easing up. Patient is asking for a call back today please. Ph# [phone]

## 2019-01-17 NOTE — Telephone Encounter (Signed)
Dr. Fry please advise. Thanks  

## 2019-01-18 ENCOUNTER — Other Ambulatory Visit: Payer: Self-pay

## 2019-01-18 MED ORDER — ISOSORBIDE MONONITRATE ER 30 MG PO TB24: 30.0000 mg | ORAL_TABLET | Freq: Every day | ORAL | 2 refills | Status: DC

## 2019-01-21 ENCOUNTER — Ambulatory Visit: Payer: Medicare Other | Admitting: Family Medicine
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
## 2019-01-22 ENCOUNTER — Ambulatory Visit (INDEPENDENT_AMBULATORY_CARE_PROVIDER_SITE_OTHER): Payer: Medicare Other | Admitting: Family Medicine

## 2019-01-22 ENCOUNTER — Encounter: Payer: Self-pay | Admitting: Family Medicine

## 2019-01-22 DIAGNOSIS — G44209 Tension-type headache, unspecified, not intractable: Secondary | ICD-10-CM

## 2019-01-22 DIAGNOSIS — S161XXD Strain of muscle, fascia and tendon at neck level, subsequent encounter: Secondary | ICD-10-CM
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
## 2019-01-23 ENCOUNTER — Other Ambulatory Visit: Payer: Self-pay

## 2019-01-23 MED ORDER — ISOSORBIDE MONONITRATE ER 30 MG PO TB24: 30.0000 mg | ORAL_TABLET | Freq: Every day | ORAL | 2 refills | Status: DC

## 2019-01-24 ENCOUNTER — Telehealth (INDEPENDENT_AMBULATORY_CARE_PROVIDER_SITE_OTHER): Payer: Self-pay | Admitting: Orthopaedic Surgery

## 2019-01-24 ENCOUNTER — Encounter (INDEPENDENT_AMBULATORY_CARE_PROVIDER_SITE_OTHER): Payer: Self-pay | Admitting: Orthopaedic Surgery

## 2019-01-24 ENCOUNTER — Ambulatory Visit (INDEPENDENT_AMBULATORY_CARE_PROVIDER_SITE_OTHER): Payer: Self-pay

## 2019-01-24 ENCOUNTER — Other Ambulatory Visit: Payer: Self-pay

## 2019-01-24 ENCOUNTER — Ambulatory Visit (INDEPENDENT_AMBULATORY_CARE_PROVIDER_SITE_OTHER): Payer: Medicare Other | Admitting: Orthopaedic Surgery

## 2019-01-24 DIAGNOSIS — M25531 Pain in right wrist: Secondary | ICD-10-CM

## 2019-01-24 DIAGNOSIS — S161XXA Strain of muscle, fascia and tendon at neck level, initial encounter: Secondary | ICD-10-CM

## 2019-01-24 MED ORDER — METHOCARBAMOL 500 MG PO TABS: 500.0000 mg | ORAL_TABLET | Freq: Two times a day (BID) | ORAL | 0 refills | Status: DC | PRN

## 2019-01-24 MED ORDER — METHYLPREDNISOLONE 4 MG PO TBPK: ORAL_TABLET | ORAL | 0 refills | Status: DC
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
## 2019-01-24 NOTE — Telephone Encounter (Signed)
Patient request copy of hand xray on disc to take to Dr Mina Marble per Dr Roda Shutters.

## 2019-01-26 ENCOUNTER — Telehealth: Payer: Self-pay | Admitting: Cardiology

## 2019-01-26 NOTE — Telephone Encounter (Signed)
LVM for patient to call office back, to move her 5/28 appt up sooner with Dr. Antoine Poche. (virtual visit)

## 2019-01-30 ENCOUNTER — Other Ambulatory Visit: Payer: Self-pay

## 2019-01-30 MED ORDER — ISOSORBIDE MONONITRATE ER 30 MG PO TB24: 30.0000 mg | ORAL_TABLET | Freq: Every day | ORAL | 2 refills | Status: DC

## 2019-02-19 ENCOUNTER — Telehealth: Payer: Self-pay | Admitting: Cardiology

## 2019-02-19 NOTE — Telephone Encounter (Signed)
LVM regarding video or phone visit. 02-19-19 ST

## 2019-02-19 NOTE — Telephone Encounter (Signed)
LVM for patient to call regarding phone or video visit. 02-19-19 ST

## 2019-02-25 ENCOUNTER — Telehealth: Payer: Self-pay | Admitting: Cardiology

## 2019-02-25 NOTE — Telephone Encounter (Signed)
Patient is calling stating she believes that is going to be called back to work, [employer].  Since she is high risk she is wondering if Dr. Antoine Poche would write her a letter keeping her out of work.

## 2019-02-25 NOTE — Telephone Encounter (Signed)
She need to follow usual precautions wearing a mask and hand washing.

## 2019-02-26 ENCOUNTER — Other Ambulatory Visit: Payer: Self-pay

## 2019-02-26 ENCOUNTER — Encounter: Payer: Self-pay | Admitting: Adult Health

## 2019-02-26 ENCOUNTER — Ambulatory Visit (INDEPENDENT_AMBULATORY_CARE_PROVIDER_SITE_OTHER): Payer: Medicare Other | Admitting: Adult Health

## 2019-02-26 ENCOUNTER — Telehealth: Payer: Self-pay | Admitting: *Deleted

## 2019-02-26 DIAGNOSIS — Z0279 Encounter for issue of other medical certificate: Secondary | ICD-10-CM

## 2019-02-26 NOTE — Telephone Encounter (Signed)
Copied from CRM [id number]. Topic: General - Inquiry >> Feb 26, 2019  4:50 PM Baldo Daub L wrote: Reason for CRM:   Pt called and states that she needs to pick up a note from the office from [place].  Advised pt that office closed at 4pm.  She would like a call to know when she can come tomorrow to get this.

## 2019-02-26 NOTE — Progress Notes (Signed)
Virtual Visit via Video Note  I connected with Krista Benjamin on 02/26/19 at  2:30 PM EDT by a video enabled telemedicine application and verified that I am speaking with the correct person using two identifiers.  Location patient: home Location provider:work or home office Persons participating in the virtual visit: patient, provider  I discussed the limitations of evaluation and management by telemedicine and the availability of in person appointments. The patient expressed understanding and agreed to proceed.   HPI: 68 year old female who is a patient of Dr. Clent Ridges, I am seeing her today so that she can get a note for work.  He works part-time at [employer] and they are wanting her to come back to work.  She does not feel comfortable going back at this time due to COVID-19 and the fact that she is around a lot of people when she is there.  She would like a note that excuses her from working at this time since she is high risk due to hypertension and diabetes   ROS: See pertinent positives and negatives per HPI.  Past Medical History:  Diagnosis Date  . Anemia   . Aortic stenosis    Mild  . Arthritis   . Cataract    both eyes  (cataracts removed)  . Diabetes mellitus    sees Dr. Elvera Lennox   . Gynecological examination    sees Dr. Jennette Kettle   . Hyperlipidemia   . Hypertension    sees Dr. Angelina Sheriff  . Stroke [facility])    TIA    Past Surgical History:  Procedure Laterality Date  . CARPAL TUNNEL RELEASE  01/22/14   Left Hand   . CATARACT EXTRACTION     Lens surgery  . COLONOSCOPY  02/06/2018   per Dr. Myrtie Neither, no polyps, repeat in 5 yrs (colon CA in mother)   . excision of benign cyst     from left maxilla and left maxillary sinus  . KNEE SURGERY    . RIGHT/LEFT HEART CATH AND CORONARY ANGIOGRAPHY N/A 09/14/2018   Procedure: RIGHT/LEFT HEART CATH AND CORONARY ANGIOGRAPHY;  Surgeon: [place], Peter M, MD;  Location: [facility] INVASIVE CV LAB;  Service: Cardiovascular;   Laterality: N/A;  . SHOULDER ARTHROSCOPY W/ ROTATOR CUFF REPAIR Left 09-23-13   per Dr. Delrae Sawyers at [facility] center in [place]     Family History  Problem Relation Age of Onset  . Coronary artery disease Brother   . Coronary artery disease Mother   . Colon cancer Mother   . Cancer Other        fhx  . Diabetes Other        fhx  . Hyperlipidemia Other        fhx  . Hypertension Other        fhx  . Stroke Other        fhx  . Colon polyps Neg Hx   . Esophageal cancer Neg Hx   . Rectal cancer Neg Hx   . Stomach cancer Neg Hx        Current Outpatient Medications:  .  albuterol (PROVENTIL) (2.5 MG/3ML) 0.083% nebulizer solution, Take 3 mLs (2.5 mg total) by nebulization every 4 (four) hours as needed for wheezing or shortness of breath., Disp: 120 mL, Rfl: 3 .  albuterol (VENTOLIN HFA) 108 (90 Base) MCG/ACT inhaler, USE 2 INHALATIONS EVERY 4 HOURS AS NEEDED FOR WHEEZING, Disp: 54 g, Rfl: 3 .  Alcohol Swabs PADS, Use to check blood sugar  once a day.  DX E11.319, Disp: 200 each, Rfl: 1 .  aspirin 81 MG tablet, Take 81 mg by mouth at bedtime. , Disp: , Rfl:  .  atorvastatin (LIPITOR) 40 MG tablet, TAKE 1 TABLET AT BEDTIME, Disp: 90 tablet, Rfl: 4 .  bumetanide (BUMEX) 1 MG tablet, TAKE 2 TABLETS AT BEDTIME, Disp: 180 tablet, Rfl: 3 .  cholecalciferol (VITAMIN D) 1000 UNITS tablet, Take 2,000 Units by mouth at bedtime. , Disp: , Rfl:  .  cyclobenzaprine (FLEXERIL) 10 MG tablet, Take 1 tablet (10 mg total) by mouth 3 (three) times daily as needed for muscle spasms., Disp: 60 tablet, Rfl: 2 .  EPIPEN 2-PAK 0.3 MG/0.3ML SOAJ injection, INJECT 0.3 MLS (0.3 MG TOTAL) INTO THE MUSCLE ONCE., Disp: 1 Device, Rfl: 0 .  glimepiride (AMARYL) 4 MG tablet, Take 1 tablet (4 mg total) by mouth daily with breakfast., Disp: 90 tablet, Rfl: 3 .  glucose blood test strip, USE TWICE A DAY, Disp: 100 each, Rfl: 12 .  hydrALAZINE (APRESOLINE) 50 MG tablet, Take 1 tablet (50 mg total) by mouth  2 (two) times daily., Disp: 270 tablet, Rfl: 1 .  Insulin Pen Needle (CAREFINE PEN NEEDLES) 32G X 4 MM MISC, Use with victoza, Disp: 100 each, Rfl: 1 .  isosorbide mononitrate (IMDUR) 30 MG 24 hr tablet, Take 1 tablet (30 mg total) by mouth daily., Disp: 30 tablet, Rfl: 2 .  metFORMIN (GLUCOPHAGE-XR) 500 MG 24 hr tablet, TAKE 2 TABLETS TWICE A DAY WITH MEALS, Disp: 360 tablet, Rfl: 3 .  methocarbamol (ROBAXIN) 500 MG tablet, Take 1 tablet (500 mg total) by mouth 2 (two) times daily as needed for muscle spasms., Disp: 20 tablet, Rfl: 0 .  methylPREDNISolone (MEDROL DOSEPAK) 4 MG TBPK tablet, Take as directed.  Keep a close eye on blood sugars for the next two weeks, Disp: 21 tablet, Rfl: 0 .  metoprolol succinate (TOPROL-XL) 100 MG 24 hr tablet, TAKE 1 TABLET (100 MG TOTAL) BY MOUTH DAILY. TAKE WITH OR IMMEDIATELY FOLLOWING A MEAL., Disp: 90 tablet, Rfl: 3 .  mupirocin ointment (BACTROBAN) 2 %, Place 1 application into the nose 2 (two) times daily., Disp: 22 g, Rfl: 1 .  naproxen (NAPROSYN) 500 MG tablet, Take 1 tablet (500 mg total) by mouth 2 (two) times daily with a meal., Disp: 60 tablet, Rfl: 2 .  NON FORMULARY, , Disp: , Rfl:  .  ONETOUCH DELICA LANCETS 33G MISC, Use to check blood sugar twice a day.  DX  E11.319, Disp: 200 each, Rfl: 1 .  quinapril (ACCUPRIL) 20 MG tablet, TAKE 1 TABLET AT BEDTIME, Disp: 90 tablet, Rfl: 4 .  TRADJENTA 5 MG TABS tablet, TAKE 1 TABLET BY MOUTH EVERY DAY IN THE MORNING, Disp: 30 tablet, Rfl: 0  Current Facility-Administered Medications:  .  0.9 %  sodium chloride infusion, 500 mL, Intravenous, Once, Danis, Andreas Blower, MD  EXAM:  VITALS per patient if applicable:  GENERAL: alert, oriented, appears well and in no acute distress  HEENT: atraumatic, conjunttiva clear, no obvious abnormalities on inspection of external nose and ears  NECK: normal movements of the head and neck  LUNGS: on inspection no signs of respiratory distress, breathing rate appears  normal, no obvious gross SOB, gasping or wheezing  CV: no obvious cyanosis  MS: moves all visible extremities without noticeable abnormality  PSYCH/NEURO: pleasant and cooperative, no obvious depression or anxiety, speech and thought processing grossly intact  ASSESSMENT AND PLAN:  Discussed the following  assessment and plan:  Encounter for issue of other medical certificate -Work note provided; nothing further needed    I discussed the assessment and treatment plan with the patient. The patient was provided an opportunity to ask questions and all were answered. The patient agreed with the plan and demonstrated an understanding of the instructions.   The patient was advised to call back or seek an in-person evaluation if the symptoms worsen or if the condition fails to improve as anticipated.   Shirline Frees, NP

## 2019-02-27 ENCOUNTER — Telehealth (HOSPITAL_COMMUNITY): Payer: Self-pay | Admitting: Cardiology

## 2019-02-27 NOTE — Telephone Encounter (Signed)

## 2019-02-27 NOTE — Telephone Encounter (Signed)
Pt notified to come and pick up her letter.  Placed at the front desk.  Nothing further needed.

## 2019-03-01 ENCOUNTER — Other Ambulatory Visit: Payer: Self-pay

## 2019-03-01 ENCOUNTER — Ambulatory Visit (HOSPITAL_COMMUNITY): Payer: Medicare Other | Attending: Cardiology

## 2019-03-01 DIAGNOSIS — I428 Other cardiomyopathies: Secondary | ICD-10-CM

## 2019-03-01 NOTE — Telephone Encounter (Signed)
Call and spoke with pt about Dr Antoine Poche recommendation, pt stated it is taken care of already, and thanks for calling.

## 2019-03-05 ENCOUNTER — Telehealth: Payer: Self-pay | Admitting: Cardiology

## 2019-03-05 NOTE — Telephone Encounter (Signed)
smartphone/Patient is ok with video on ipad/ my chart/ consent/ pre reg completed
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
## 2019-03-07 ENCOUNTER — Telehealth: Payer: Self-pay | Admitting: Cardiology

## 2019-03-07 ENCOUNTER — Telehealth (INDEPENDENT_AMBULATORY_CARE_PROVIDER_SITE_OTHER): Payer: Medicare Other | Admitting: Cardiology

## 2019-03-07 ENCOUNTER — Encounter: Payer: Self-pay | Admitting: Cardiology

## 2019-03-07 VITALS — BP 141/79 | HR 62 | Ht 65.0 in | Wt 250.2 lb

## 2019-03-07 DIAGNOSIS — Z7189 Other specified counseling: Secondary | ICD-10-CM

## 2019-03-07 DIAGNOSIS — I429 Cardiomyopathy, unspecified: Secondary | ICD-10-CM

## 2019-03-07 DIAGNOSIS — I10 Essential (primary) hypertension: Secondary | ICD-10-CM

## 2019-03-07 MED ORDER — HYDRALAZINE HCL 50 MG PO TABS: 50.0000 mg | ORAL_TABLET | Freq: Three times a day (TID) | ORAL | 3 refills | Status: DC

## 2019-03-07 NOTE — Telephone Encounter (Signed)
New Message ° ° ° °Pt is returning call  ° ° ° °Please call back  °

## 2019-03-07 NOTE — Telephone Encounter (Signed)
Pt had virtual visit appointment with Dr Antoine Poche today

## 2019-03-07 NOTE — Addendum Note (Signed)
Addended by: Barrie Dunker on: 03/07/2019 10:21 AM   Modules accepted: Orders

## 2019-03-07 NOTE — Patient Instructions (Addendum)
Medication Instructions:  INCREASE- Hydralazine 50 mg three times a day  If you need a refill on your cardiac medications before your next appointment, please call your pharmacy.  Labwork: None Ordered   Testing/Procedures: None Ordered  Follow-Up: . Your physician recommends that you schedule a follow-up appointment in: 2 Months   At [facility], you and your health needs are our priority.  As part of our continuing mission to provide you with exceptional heart care, we have created designated Provider Care Teams.  These Care Teams include your primary Cardiologist (physician) and Advanced Practice Providers (APPs -  Physician Assistants and Nurse Practitioners) who all work together to provide you with the care you need, when you need it.  Thank you for choosing [facility] at [facility]!!

## 2019-03-11 ENCOUNTER — Other Ambulatory Visit: Payer: Self-pay

## 2019-03-11 ENCOUNTER — Encounter: Payer: Self-pay | Admitting: Internal Medicine

## 2019-03-11 ENCOUNTER — Ambulatory Visit (INDEPENDENT_AMBULATORY_CARE_PROVIDER_SITE_OTHER): Payer: Medicare Other | Admitting: Internal Medicine

## 2019-03-11 DIAGNOSIS — E11319 Type 2 diabetes mellitus with unspecified diabetic retinopathy without macular edema: Secondary | ICD-10-CM

## 2019-03-11 DIAGNOSIS — IMO0002 Reserved for concepts with insufficient information to code with codable children: Secondary | ICD-10-CM

## 2019-03-11 DIAGNOSIS — E1165 Type 2 diabetes mellitus with hyperglycemia: Secondary | ICD-10-CM

## 2019-03-11 DIAGNOSIS — E782 Mixed hyperlipidemia: Secondary | ICD-10-CM

## 2019-03-11 DIAGNOSIS — E669 Obesity, unspecified: Secondary | ICD-10-CM | POA: Diagnosis not present

## 2019-03-11 MED ORDER — LINAGLIPTIN 5 MG PO TABS: ORAL_TABLET | ORAL | 3 refills | Status: DC
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
## 2019-03-11 NOTE — Patient Instructions (Signed)
Please continue: - Metformin ER 1000 mg 2x a day with meals - Amaryl 4 mg before breakfast - Tradjenta 5 mg before breakfast  Please add 2 mg Amaryl before dinner if the sugars remain high.  Please return in 3-4 months with your sugar log.

## 2019-03-21 ENCOUNTER — Other Ambulatory Visit: Payer: Self-pay

## 2019-03-21 ENCOUNTER — Encounter: Payer: Self-pay | Admitting: Orthopaedic Surgery

## 2019-03-21 ENCOUNTER — Ambulatory Visit (INDEPENDENT_AMBULATORY_CARE_PROVIDER_SITE_OTHER): Payer: Medicare Other | Admitting: Orthopaedic Surgery

## 2019-03-21 DIAGNOSIS — S161XXD Strain of muscle, fascia and tendon at neck level, subsequent encounter: Secondary | ICD-10-CM | POA: Diagnosis not present

## 2019-03-21 NOTE — Progress Notes (Signed)
Office Visit Note   Patient: Krista Benjamin           Date of Birth: 08/20/1951           MRN: [id number] Visit Date: 03/21/2019              Requested by: Nelwyn Salisbury, MD [address] [place],  [place] [address] PCP: Nelwyn Salisbury, MD   Assessment & Plan: Visit Diagnoses:  1. Strain of neck muscle, subsequent encounter     Plan: Impression is improving cervical strain and improving right wrist pain.  She will continue to go to outpatient physical therapy for the neck.  My impression is that there were tension headaches from muscle spasms.  If these continue despite physical therapy we will make a referral to neurology for further evaluation.  Otherwise she can follow-up with [place] as needed.  Follow-Up Instructions: Return if symptoms worsen or fail to improve.   Orders:  No orders of the defined types were placed in this encounter.  No orders of the defined types were placed in this encounter.     Procedures: No procedures performed   Clinical Data: No additional findings.   Subjective: Chief Complaint  Patient presents with  . Neck - Pain  . Right Wrist - Pain    Krista Benjamin returns today for follow-up for her neck and wrist pain.  She states the neck pain is getting better.  The wrist is still sore but the pain is comes and goes.  She feels that the pain is overall very manageable.  She mainly gets some posterior headaches that is related to some tension in her neck.   Review of Systems   Objective: Vital Signs: There were no vitals taken for this visit.  Physical Exam  Ortho Exam Musculoskeletal exams are unchanged. Specialty Comments:  No specialty comments available.  Imaging: No results found.   PMFS History: Patient Active Problem List   Diagnosis Date Noted  . Cervical strain 01/24/2019  . Dilated cardiomyopathy (HCC) 09/14/2018  . Nonrheumatic aortic valve stenosis 08/12/2018  . 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 Uncontrolled type 2 diabetes mellitus with retinopathy, without long-term current use of insulin (HCC) 11/27/2015  . Bronchitis with bronchospasm 03/16/2013  . AORTIC VALVE DISORDERS 04/26/2010  . Obesity, unspecified 05/07/2009  . MURMUR 05/07/2009  . CHEST PAIN 05/07/2009  . LOW BACK PAIN 01/09/2008  . ANEMIA-IRON DEFICIENCY 11/21/2007  . Weakness 11/21/2007  . Hyperlipemia, mixed 10/26/2007  . Essential hypertension 10/26/2007   Past Medical History:  Diagnosis Date  . Anemia   . Aortic stenosis    Mild  . Arthritis   . Cataract    both eyes  (cataracts removed)  . Diabetes mellitus    sees Dr. Elvera Lennox   . Gynecological examination    sees Dr. Jennette Kettle   . Hyperlipidemia   . Hypertension    sees Dr. Angelina Sheriff  . Stroke [facility])    TIA    Family History  Problem Relation Age of Onset  . Coronary artery disease Brother   . Coronary artery disease Mother   . Colon cancer Mother   . Cancer Other        fhx  . Diabetes Other        fhx  . Hyperlipidemia Other        fhx  . Hypertension Other        fhx  . Stroke  Other        fhx  . Colon polyps Neg Hx   . Esophageal cancer Neg Hx   . Rectal cancer Neg Hx   . Stomach cancer Neg Hx     Past Surgical History:  Procedure Laterality Date  . CARPAL TUNNEL RELEASE  01/22/14   Left Hand   . CATARACT EXTRACTION     Lens surgery  . COLONOSCOPY  02/06/2018   per Dr. Loletha Carrow, no polyps, repeat in 5 yrs (colon CA in mother)   . excision of benign cyst     from left maxilla and left maxillary sinus  . KNEE SURGERY    . RIGHT/LEFT HEART CATH AND CORONARY ANGIOGRAPHY N/A 09/14/2018   Procedure: RIGHT/LEFT HEART CATH AND CORONARY ANGIOGRAPHY;  Surgeon: Martinique, Peter M, MD;  Location: [facility];  Service: Cardiovascular;  Laterality: N/A;  . SHOULDER ARTHROSCOPY W/ ROTATOR CUFF REPAIR Left 09-23-13   per Dr. Devonne Doughty at [facility] center in [place] History   . Not on file  Tobacco Use  . Smoking status: Never Smoker  . Smokeless tobacco: Never Used  Substance and Sexual Activity  . Alcohol use: No    Alcohol/week: 0.0 standard drinks  . Drug use: No  . Sexual activity: Not on file

## 2019-04-01 ENCOUNTER — Telehealth: Payer: Self-pay | Admitting: Cardiology

## 2019-04-01 MED ORDER — ISOSORBIDE MONONITRATE ER 30 MG PO TB24: 30.0000 mg | ORAL_TABLET | Freq: Every day | ORAL | 2 refills | Status: DC

## 2019-04-01 NOTE — Telephone Encounter (Signed)
[facility]  She needs enough to last  until she receives the mail order.  [facility].

## 2019-04-01 NOTE — Telephone Encounter (Signed)
Script [facility]

## 2019-04-04 ENCOUNTER — Other Ambulatory Visit: Payer: Self-pay

## 2019-04-04 MED ORDER — ISOSORBIDE MONONITRATE ER 30 MG PO TB24: 30.0000 mg | ORAL_TABLET | Freq: Every day | ORAL | 2 refills | Status: DC

## 2019-04-04 NOTE — Telephone Encounter (Signed)
Refill

## 2019-04-09 ENCOUNTER — Other Ambulatory Visit: Payer: Self-pay | Admitting: Family Medicine

## 2019-04-16 ENCOUNTER — Encounter: Payer: Self-pay | Admitting: Family Medicine

## 2019-04-16 ENCOUNTER — Other Ambulatory Visit: Payer: Self-pay

## 2019-04-16 ENCOUNTER — Ambulatory Visit (INDEPENDENT_AMBULATORY_CARE_PROVIDER_SITE_OTHER): Payer: Medicare Other | Admitting: Family Medicine

## 2019-04-16 DIAGNOSIS — B379 Candidiasis, unspecified: Secondary | ICD-10-CM

## 2019-04-16 NOTE — Progress Notes (Signed)
   Subjective:    Patient ID: Krista Benjamin, female    DOB: 1951-05-21, 68 y.o.   MRN: [id number]  HPI Virtual Visit via Telephone Note  I connected with the patient on 04/16/19 at  4:15 PM EDT by telephone and verified that I am speaking with the correct person using two identifiers. We attempted to connect virtually but we had technical difficulties with the audio and video.     I discussed the limitations, risks, security and privacy concerns of performing an evaluation and management service by telephone and the availability of in person appointments. I also discussed with the patient that there may be a patient responsible charge related to this service. The patient expressed understanding and agreed to proceed.  Location patient: home Location provider: work or home office Participants present for the call: patient, provider Patient did not have a visit in the prior 7 days to address this/these issue(s).   History of Present Illness: Here for one week of an itchy red rash on the left lower abdomen beneath the belly fold. She is prone to yeast infections every summer.   Observations/Objective: Patient sounds cheerful and well on the phone. I do not appreciate any SOB. Speech and thought processing are grossly intact. Patient reported vitals:  Assessment and Plan: Candidiasis, treat with Ketoconazole cream.  Alysia Penna, MD   Follow Up Instructions:     [phone] 5-10 [phone] 11-20 [id number] 21-30 I did not refer this patient for an OV in the next 24 hours for this/these issue(s).  I discussed the assessment and treatment plan with the patient. The patient was provided an opportunity to ask questions and all were answered. The patient agreed with the plan and demonstrated an understanding of the instructions.   The patient was advised to call back or seek an in-person evaluation if the symptoms worsen or if the condition fails to improve as anticipated.  I provided 10  minutes of non-face-to-face time during this encounter.   Alysia Penna, MD    Review of Systems     Objective:   Physical Exam        Assessment & Plan:

## 2019-04-24 ENCOUNTER — Telehealth: Payer: Self-pay | Admitting: Cardiology

## 2019-04-24 NOTE — Progress Notes (Signed)
Virtual Visit via Video Note   This visit type was conducted due to national recommendations for restrictions regarding the COVID-19 Pandemic (e.g. social distancing) in an effort to limit this patient's exposure and mitigate transmission in our community.  Due to her co-morbid illnesses, this patient is at least at moderate risk for complications without adequate follow up.  This format is felt to be most appropriate for this patient at this time.  All issues noted in this document were discussed and addressed.  A limited physical exam was performed with this format.  Please refer to the patient's chart for her consent to telehealth for [facility].   Date:  04/25/2019   ID:  Krista Benjamin, DOB [birth year], MRN [id number]  Patient Location: Home Provider Location: Home  PCP:  Nelwyn Salisbury, MD  Cardiologist:  Rollene Rotunda, MD    Evaluation Performed:  Follow-Up Visit  Chief Complaint:  Cardiomyopathy   History of Present Illness:    Krista Benjamin is a 68 y.o. female who presents for followup of a mildly reduced  EF 40 - 45%.   She had a cath last year and had minimal CAD.   Her EF a couple of days ago on echo was lower at 35 - 40%.      Since I last saw her she has done well.  The patient denies any new symptoms such as chest discomfort, neck or arm discomfort. There has been no new shortness of breath, PND or orthopnea. There have been no reported palpitations, presyncope or syncope.   She just saw Dr. Clent Ridges and had labs drawn.  At the last visit I increased her hydralazine to 3 times daily and she has done well with this.  The patient does not have symptoms concerning for COVID-19 infection (fever, chills, cough, or new shortness of breath).  Of note she thinks she might of been exposed and so she just had serology testing done   Past Medical History:  Diagnosis Date  . Anemia   . Aortic stenosis    Mild  . Arthritis   . Cataract    both eyes  (cataracts  removed)  . Diabetes mellitus    sees Dr. Elvera Lennox   . Gynecological examination    sees Dr. Jennette Kettle   . Hyperlipidemia   . Hypertension    sees Dr. Angelina Sheriff  . Stroke [facility])    TIA   Past Surgical History:  Procedure Laterality Date  . CARPAL TUNNEL RELEASE  01/22/14   Left Hand   . CATARACT EXTRACTION     Lens surgery  . COLONOSCOPY  02/06/2018   per Dr. Myrtie Neither, no polyps, repeat in 5 yrs (colon CA in mother)   . excision of benign cyst     from left maxilla and left maxillary sinus  . KNEE SURGERY    . RIGHT/LEFT HEART CATH AND CORONARY ANGIOGRAPHY N/A 09/14/2018   Procedure: RIGHT/LEFT HEART CATH AND CORONARY ANGIOGRAPHY;  Surgeon: [place], Peter M, MD;  Location: [facility];  Service: Cardiovascular;  Laterality: N/A;  . SHOULDER ARTHROSCOPY W/ ROTATOR CUFF REPAIR Left 09-23-13   per Dr. Delrae Sawyers at [facility] center in [place]     Prior to Admission medications   Medication Sig Start Date End Date Taking? Authorizing Provider  albuterol (PROVENTIL) (2.5 MG/3ML) 0.083% nebulizer solution Take 3 mLs (2.5 mg total) by nebulization every 4 (four) hours as needed for wheezing or shortness of breath. 11/27/18  Yes Gershon Crane  A, MD  albuterol (VENTOLIN HFA) 108 (90 Base) MCG/ACT inhaler USE 2 INHALATIONS EVERY 4 HOURS AS NEEDED FOR WHEEZING 11/27/18  Yes Laurey Morale, MD  Alcohol Swabs PADS Use to check blood sugar once a day.  DX E11.319 10/17/18  Yes Philemon Kingdom, MD  aspirin 81 MG tablet Take 81 mg by mouth at bedtime.    Yes [provider]  atorvastatin (LIPITOR) 40 MG tablet TAKE 1 TABLET AT BEDTIME 11/13/18  Yes Laurey Morale, MD  bumetanide (BUMEX) 1 MG tablet TAKE 2 TABLETS AT BEDTIME 11/23/18  Yes Laurey Morale, MD  cholecalciferol (VITAMIN D) 1000 UNITS tablet Take 2,000 Units by mouth at bedtime.    Yes [provider]  cyclobenzaprine (FLEXERIL) 10 MG tablet Take 1 tablet (10 mg total) by mouth 3 (three) times daily as needed for  muscle spasms. 01/04/19  Yes Laurey Morale, MD  EPIPEN 2-PAK 0.3 MG/0.3ML SOAJ injection INJECT 0.3 MLS (0.3 MG TOTAL) INTO THE MUSCLE ONCE. 04/19/16  Yes Laurey Morale, MD  glimepiride (AMARYL) 4 MG tablet Take 1 tablet (4 mg total) by mouth daily with breakfast. 11/09/18  Yes Philemon Kingdom, MD  glucose blood test strip USE TWICE A DAY 10/17/18  Yes Philemon Kingdom, MD  hydrALAZINE (APRESOLINE) 50 MG tablet Take 1 tablet (50 mg total) by mouth 3 (three) times daily. 03/07/19 06/05/19 Yes Minus Breeding, MD  Insulin Pen Needle (CAREFINE PEN NEEDLES) 32G X 4 MM MISC Use with victoza 04/17/18  Yes Philemon Kingdom, MD  isosorbide mononitrate (IMDUR) 30 MG 24 hr tablet Take 1 tablet (30 mg total) by mouth daily. 04/25/19  Yes Laurey Morale, MD  ketoconazole (NIZORAL) 2 % cream Apply 1 application topically daily. 04/25/19  Yes Laurey Morale, MD  linagliptin (TRADJENTA) 5 MG TABS tablet TAKE 1 TABLET BY MOUTH EVERY DAY IN THE MORNING 03/11/19  Yes Philemon Kingdom, MD  metFORMIN (GLUCOPHAGE-XR) 500 MG 24 hr tablet TAKE 2 TABLETS TWICE A DAY WITH MEALS 09/17/18  Yes [place], Peter M, MD  methocarbamol (ROBAXIN) 500 MG tablet Take 1 tablet (500 mg total) by mouth 2 (two) times daily as needed for muscle spasms. 01/24/19  Yes Aundra Dubin, PA-C  metoprolol succinate (TOPROL-XL) 100 MG 24 hr tablet TAKE 1 TABLET (100 MG TOTAL) BY MOUTH DAILY. TAKE WITH OR IMMEDIATELY FOLLOWING A MEAL. 04/09/19  Yes Laurey Morale, MD  mupirocin ointment (BACTROBAN) 2 % Place 1 application into the nose 2 (two) times daily. 12/07/18  Yes Laurey Morale, MD  NON FORMULARY    Yes [provider]  Jonetta Speak LANCETS 76P MISC Use to check blood sugar twice a day.  DX  E11.319 06/01/16  Yes Philemon Kingdom, MD  quinapril (ACCUPRIL) 20 MG tablet TAKE 1 TABLET AT BEDTIME Patient taking differently: Take 20 mg by mouth 2 (two) times a day.  12/03/18  Yes Laurey Morale, MD      Allergies:   Patient has no known  allergies.   Social History   Tobacco Use  . Smoking status: Never Smoker  . Smokeless tobacco: Never Used  Substance Use Topics  . Alcohol use: No    Alcohol/week: 0.0 standard drinks  . Drug use: No     Family Hx: The patient's family history includes Cancer in an other family member; Colon cancer in her mother; Coronary artery disease in her brother and mother; Diabetes in an other family member; Hyperlipidemia in an other family member;  Hypertension in an other family member; Stroke in an other family member. There is no history of Colon polyps, Esophageal cancer, Rectal cancer, or Stomach cancer.  ROS:   Please see the history of present illness.    As stated in the HPI and negative for all other systems.   Prior CV studies:   The following studies were reviewed today:  None  Labs/Other Tests and Data Reviewed:    EKG:  No ECG reviewed.  Recent Labs: 09/12/2018: BUN 35; Creatinine, Ser 1.09; Hemoglobin 11.3; Platelets 273; Potassium 4.6; Sodium 140   Recent Lipid Panel Lab Results  Component Value Date/Time   CHOL 167 04/20/2018 09:06 AM   TRIG 114.0 04/20/2018 09:06 AM   HDL 48.60 04/20/2018 09:06 AM   CHOLHDL 3 04/20/2018 09:06 AM   LDLCALC 96 04/20/2018 09:06 AM    Wt Readings from Last 3 Encounters:  04/25/19 249 lb 9.6 oz (113.2 kg)  04/25/19 249 lb 9.6 oz (113.2 kg)  03/07/19 250 lb 3.2 oz (113.5 kg)     Objective:    Vital Signs:  BP 130/90   Pulse 73   Temp 99.4 F (37.4 C)   Ht 5' 4.5" (1.638 m)   Wt 249 lb 9.6 oz (113.2 kg)   SpO2 99%   BMI 42.18 kg/m    VITAL SIGNS:  Reviewed   ASSESSMENT & PLAN:    CARDIOMYOPATHY:  Today I am going to increase her Imdur to 120 mg daily.  Otherwise no change in therapy.   AORTIC STENOSIS:   She had no evidence of this on recent echo.  No further imaging is indicated.  HTN:   This is been be managed in the context of treating a reduced ejection fraction.  OBESITY:    We have discussed weight loss  strategies in the past.  DM:  Her A1C is 7.1 per Dr. Clent RidgesFry.   BRUIT:  I will order Doppler when she comes back to the office.  COVID-19 Education: The signs and symptoms of COVID-19 were discussed with the patient and how to seek care for testing (follow up with PCP or arrange E-visit).  The importance of social distancing was discussed today.  Time:   Today, I have spent 16 minutes with the patient with telehealth technology discussing the above problems.     Medication Adjustments/Labs and Tests Ordered: Current medicines are reviewed at length with the patient today.  Concerns regarding medicines are outlined above.   Tests Ordered: No orders of the defined types were placed in this encounter.   Medication Changes: No orders of the defined types were placed in this encounter.   Disposition:  Follow up APP in the office in 4 months.   Signed, Rollene RotundaJames Ryver Poblete, MD  04/25/2019 9:54 AM    [facility]

## 2019-04-24 NOTE — Telephone Encounter (Signed)
LVM, reminding pt of her appt with Dr Percival Spanish on 04-25-19.

## 2019-04-25 ENCOUNTER — Ambulatory Visit (INDEPENDENT_AMBULATORY_CARE_PROVIDER_SITE_OTHER): Payer: Medicare Other | Admitting: Family Medicine

## 2019-04-25 ENCOUNTER — Other Ambulatory Visit: Payer: Self-pay

## 2019-04-25 ENCOUNTER — Telehealth (INDEPENDENT_AMBULATORY_CARE_PROVIDER_SITE_OTHER): Payer: Medicare Other | Admitting: Cardiology

## 2019-04-25 ENCOUNTER — Encounter: Payer: Self-pay | Admitting: Family Medicine

## 2019-04-25 VITALS — BP 130/90 | HR 73 | Temp 99.4°F | Ht 64.5 in | Wt 249.6 lb

## 2019-04-25 DIAGNOSIS — E1165 Type 2 diabetes mellitus with hyperglycemia: Secondary | ICD-10-CM

## 2019-04-25 DIAGNOSIS — Z0000 Encounter for general adult medical examination without abnormal findings: Secondary | ICD-10-CM | POA: Diagnosis not present

## 2019-04-25 DIAGNOSIS — Z209 Contact with and (suspected) exposure to unspecified communicable disease: Secondary | ICD-10-CM

## 2019-04-25 DIAGNOSIS — E11319 Type 2 diabetes mellitus with unspecified diabetic retinopathy without macular edema: Secondary | ICD-10-CM | POA: Diagnosis not present

## 2019-04-25 DIAGNOSIS — R0989 Other specified symptoms and signs involving the circulatory and respiratory systems: Secondary | ICD-10-CM | POA: Diagnosis not present

## 2019-04-25 DIAGNOSIS — IMO0002 Reserved for concepts with insufficient information to code with codable children: Secondary | ICD-10-CM

## 2019-04-25 LAB — LIPID PANEL
Cholesterol: 153 mg/dL (ref 0–200)
HDL: 42.3 mg/dL (ref >39.00)
LDL Cholesterol: 86 mg/dL (ref 0–99)
NonHDL: 110.48
Total CHOL/HDL Ratio: 4
Triglycerides: 121 mg/dL (ref 0.0–149.0)
VLDL: 24.2 mg/dL (ref 0.0–40.0)

## 2019-04-25 LAB — POC URINALSYSI DIPSTICK (AUTOMATED)
Bilirubin, UA: NEGATIVE
Blood, UA: NEGATIVE
Glucose, UA: NEGATIVE
Ketones, UA: NEGATIVE
Nitrite, UA: NEGATIVE
Protein, UA: POSITIVE — AB
Spec Grav, UA: 1.025 (ref 1.010–1.025)
Urobilinogen, UA: 0.2 E.U./dL
pH, UA: 5.5 (ref 5.0–8.0)

## 2019-04-25 LAB — HEPATIC FUNCTION PANEL
ALT: 9 U/L (ref 0–35)
AST: 14 U/L (ref 0–37)
Albumin: 4.4 g/dL (ref 3.5–5.2)
Alkaline Phosphatase: 71 U/L (ref 39–117)
Bilirubin, Direct: 0.1 mg/dL (ref 0.0–0.3)
Total Bilirubin: 0.6 mg/dL (ref 0.2–1.2)
Total Protein: 7.5 g/dL (ref 6.0–8.3)

## 2019-04-25 LAB — CBC WITH DIFFERENTIAL/PLATELET
Basophils Absolute: 0.1 10*3/uL (ref 0.0–0.1)
Basophils Relative: 0.6 % (ref 0.0–3.0)
Eosinophils Absolute: 0.1 10*3/uL (ref 0.0–0.7)
Eosinophils Relative: 1 % (ref 0.0–5.0)
HCT: 36.4 % (ref 36.0–46.0)
Hemoglobin: 11.5 g/dL — ABNORMAL LOW (ref 12.0–15.0)
Lymphocytes Relative: 30.3 % (ref 12.0–46.0)
Lymphs Abs: 2.7 10*3/uL (ref 0.7–4.0)
MCHC: 31.6 g/dL (ref 30.0–36.0)
MCV: 78.2 fl (ref 78.0–100.0)
Monocytes Absolute: 0.5 10*3/uL (ref 0.1–1.0)
Monocytes Relative: 5.8 % (ref 3.0–12.0)
Neutro Abs: 5.5 10*3/uL (ref 1.4–7.7)
Neutrophils Relative %: 62.3 % (ref 43.0–77.0)
Platelets: 251 10*3/uL (ref 150.0–400.0)
RBC: 4.66 Mil/uL (ref 3.87–5.11)
RDW: 15.2 % (ref 11.5–15.5)
WBC: 8.9 10*3/uL (ref 4.0–10.5)

## 2019-04-25 LAB — HEMOGLOBIN A1C: Hgb A1c MFr Bld: 7.3 % — ABNORMAL HIGH (ref 4.6–6.5)

## 2019-04-25 LAB — BASIC METABOLIC PANEL
BUN: 21 mg/dL (ref 6–23)
CO2: 30 mEq/L (ref 19–32)
Calcium: 9.7 mg/dL (ref 8.4–10.5)
Chloride: 102 mEq/L (ref 96–112)
Creatinine, Ser: 0.97 mg/dL (ref 0.40–1.20)
GFR: 68.89 mL/min (ref >60.00)
Glucose, Bld: 104 mg/dL — ABNORMAL HIGH (ref 70–99)
Potassium: 4.5 mEq/L (ref 3.5–5.1)
Sodium: 142 mEq/L (ref 135–145)

## 2019-04-25 LAB — TSH: TSH: 1.14 u[IU]/mL (ref 0.35–4.50)

## 2019-04-25 MED ORDER — ISOSORBIDE MONONITRATE ER 30 MG PO TB24: 30.0000 mg | ORAL_TABLET | Freq: Every day | ORAL | 3 refills | Status: DC

## 2019-04-25 MED ORDER — ISOSORBIDE MONONITRATE ER 120 MG PO TB24: 120.0000 mg | ORAL_TABLET | Freq: Every day | ORAL | 3 refills | Status: DC

## 2019-04-25 MED ORDER — KETOCONAZOLE 2 % EX CREA: 1.0000 "application " | TOPICAL_CREAM | Freq: Every day | CUTANEOUS | 5 refills | Status: DC

## 2019-04-25 NOTE — Progress Notes (Signed)
   Subjective:    Patient ID: Krista Benjamin, female    DOB: 11/08/1950, 68 y.o.   MRN: [id number]  HPI Here for a well exam. She feels well in general. Her neck pain has almost disappeared after she completed her PT.    Review of Systems  Constitutional: Negative.   HENT: Negative.   Eyes: Negative.   Respiratory: Negative.   Cardiovascular: Negative.   Gastrointestinal: Negative.   Genitourinary: Negative for decreased urine volume, difficulty urinating, dyspareunia, dysuria, enuresis, flank pain, frequency, hematuria, pelvic pain and urgency.  Musculoskeletal: Negative.   Skin: Negative.   Neurological: Negative.   Psychiatric/Behavioral: Negative.        Objective:   Physical Exam Constitutional:      General: She is not in acute distress.    Appearance: She is well-developed.  HENT:     Head: Normocephalic and atraumatic.     Right Ear: External ear normal.     Left Ear: External ear normal.     Nose: Nose normal.     Mouth/Throat:     Pharynx: No oropharyngeal exudate.  Eyes:     General: No scleral icterus.    Conjunctiva/sclera: Conjunctivae normal.     Pupils: Pupils are equal, round, and reactive to light.  Neck:     Musculoskeletal: Normal range of motion and neck supple.     Thyroid: No thyromegaly.     Vascular: No JVD.  Cardiovascular:     Rate and Rhythm: Normal rate and regular rhythm.     Heart sounds: Normal heart sounds. No murmur. No friction rub. No gallop.   Pulmonary:     Effort: Pulmonary effort is normal. No respiratory distress.     Breath sounds: Normal breath sounds. No wheezing or rales.  Chest:     Chest wall: No tenderness.  Abdominal:     General: Bowel sounds are normal. There is no distension.     Palpations: Abdomen is soft. There is no mass.     Tenderness: There is no abdominal tenderness. There is no guarding or rebound.  Musculoskeletal: Normal range of motion.        General: No tenderness.  Lymphadenopathy:   Cervical: No cervical adenopathy.  Skin:    General: Skin is warm and dry.     Findings: No erythema or rash.  Neurological:     Mental Status: She is alert and oriented to person, place, and time.     Cranial Nerves: No cranial nerve deficit.     Motor: No abnormal muscle tone.     Coordination: Coordination normal.     Deep Tendon Reflexes: Reflexes are normal and symmetric. Reflexes normal.  Psychiatric:        Behavior: Behavior normal.        Thought Content: Thought content normal.        Judgment: Judgment normal.           Assessment & Plan:  Well exam. We discussed diet and exercise. Get fasting labs. Alysia Penna, MD

## 2019-04-25 NOTE — Patient Instructions (Addendum)
Medication Instructions:  INCREASE YOUR ISOSORBIDE 120 mg DAILY   If you need a refill on your cardiac medications before your next appointment, please call your pharmacy.   Lab work: NONE  Testing/Procedures: Your physician has requested that you have a carotid duplex. This test is an ultrasound of the carotid arteries in your neck. It looks at blood flow through these arteries that supply the brain with blood. Allow one hour for this exam. There are no restrictions or special instructions. WHEN YOU RETURN IN 4 MONTHS  Follow-Up At [facility], you and your health needs are our priority.  As part of our continuing mission to provide you with exceptional heart care, we have created designated Provider Care Teams.  These Care Teams include your primary Cardiologist (physician) and Advanced Practice Providers (APPs -  Physician Assistants and Nurse Practitioners) who all work together to provide you with the care you need, when you need it. You will need a follow up appointment in 4 months.  Please call our office 2 months in advance to schedule this appointment.  You will see one following Advanced Practice Providers on your designated Care Team:   Rosaria Ferries, PA-C . Jory Sims, DNP, ANP

## 2019-04-26 LAB — SAR COV2 SEROLOGY (COVID19)AB(IGG),IA: SARS CoV2 AB IGG: NEGATIVE

## 2019-05-10 ENCOUNTER — Other Ambulatory Visit: Payer: Self-pay | Admitting: Cardiology

## 2019-05-10 MED ORDER — ISOSORBIDE MONONITRATE ER 120 MG PO TB24: 120.0000 mg | ORAL_TABLET | Freq: Every day | ORAL | 3 refills | Status: DC

## 2019-05-10 NOTE — Telephone Encounter (Signed)
New Message    *STAT* If patient is at the pharmacy, call can be transferred to refill team.   1. Which medications need to be refilled? (please list name of each medication and dose if known) isosorbide mononitrate (IMDUR) 120 MG 24 hr tablet    2. Which pharmacy/location (including street and city if local pharmacy) is medication to be sent to? [facility] [address] [place]  3. Do they need a 30 day or 90 day supply? 90    [facility] that [facility]. Please resend.

## 2019-05-10 NOTE — Telephone Encounter (Signed)
Rx request sent to pharmacy.  

## 2019-05-24 ENCOUNTER — Telehealth: Payer: Self-pay | Admitting: *Deleted

## 2019-05-24 NOTE — Telephone Encounter (Signed)
Received clarification from mail order on Hydralazine. Confirmed with patient taking Hydralazine 50 mg TID, will [facility] to [facility]

## 2019-06-27 ENCOUNTER — Ambulatory Visit (INDEPENDENT_AMBULATORY_CARE_PROVIDER_SITE_OTHER): Payer: Medicare Other

## 2019-06-27 ENCOUNTER — Other Ambulatory Visit: Payer: Self-pay

## 2019-06-27 DIAGNOSIS — Z23 Encounter for immunization: Secondary | ICD-10-CM

## 2019-07-04 ENCOUNTER — Other Ambulatory Visit: Payer: Self-pay

## 2019-07-08 ENCOUNTER — Other Ambulatory Visit: Payer: Self-pay

## 2019-07-08 ENCOUNTER — Ambulatory Visit (INDEPENDENT_AMBULATORY_CARE_PROVIDER_SITE_OTHER): Payer: Medicare Other | Admitting: Internal Medicine

## 2019-07-08 ENCOUNTER — Encounter: Payer: Self-pay | Admitting: Internal Medicine

## 2019-07-08 VITALS — BP 138/88 | HR 74 | Ht 64.5 in | Wt 254.0 lb

## 2019-07-08 DIAGNOSIS — E1165 Type 2 diabetes mellitus with hyperglycemia: Secondary | ICD-10-CM | POA: Diagnosis not present

## 2019-07-08 DIAGNOSIS — E669 Obesity, unspecified: Secondary | ICD-10-CM | POA: Diagnosis not present

## 2019-07-08 DIAGNOSIS — E782 Mixed hyperlipidemia: Secondary | ICD-10-CM

## 2019-07-08 DIAGNOSIS — E11319 Type 2 diabetes mellitus with unspecified diabetic retinopathy without macular edema: Secondary | ICD-10-CM | POA: Diagnosis not present

## 2019-07-08 DIAGNOSIS — IMO0002 Reserved for concepts with insufficient information to code with codable children: Secondary | ICD-10-CM

## 2019-07-08 LAB — POCT GLYCOSYLATED HEMOGLOBIN (HGB A1C): Hemoglobin A1C: 6.4 % — AB (ref 4.0–5.6)

## 2019-07-08 MED ORDER — GLIMEPIRIDE 4 MG PO TABS: ORAL_TABLET | ORAL | 3 refills | Status: DC

## 2019-07-08 MED ORDER — METFORMIN HCL ER 500 MG PO TB24: ORAL_TABLET | ORAL | 3 refills | Status: DC

## 2019-07-08 NOTE — Patient Instructions (Signed)
Please continue: - Metformin ER 1000 mg 2x a day with meals  - Amaryl 4 mg before breakfast +/- 2 mg but move this 30 min before dinner - Tradjenta 5 mg before breakfast  Please return in 4 months with your sugar log.

## 2019-07-08 NOTE — Progress Notes (Signed)
Patient ID: Krista Benjamin, female   DOB: Mar 04, 1951, 68 y.o.   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  Last visit 4 months ago (virtual). She has [employer] + [employer].   She continues to work on her diet-cutting down portions.  Sugars improved since last visit.  Last hemoglobin A1c:  Lab Results  Component Value Date   HGBA1C 7.3 (H) 04/25/2019   HGBA1C 7.1 (A) 11/09/2018   HGBA1C 7.7 (A) 07/26/2018  Prev. 5.9% on 12/2012, previously 6.1%.  Pt is on a regimen of: - Metformin ER 1000 mg 2x a day with meals  - Amaryl 4 mg before breakfast + 2 mg before dinner - Tradjenta 5 mg before breakfast Reviewed previous medications tried: Rec'd GLP1 agonist >> Victoza covered >> she was afraid of SEs. We tried Jardiance 10 mg daily in am - added 04/2017 >> yeast inf >> stopped We tried Invokana 100 mg in am >> stopped after 3 days >> nausea, diarrhea We stopped Lantus 10 units qhs. We stopped Actos 45 mg (was on it since 07/2001) Metformin IR 1000 mg bid >> stopped b/c Nausea/Diarrhea She was on Novolog 8 units tid ac, when sugars >140   Pt checks her sugars 1-2 times a day: - am:105-191 >> 51-138, 151 >> 77-113, 167 >> 63-97, 106 - 2h after b'fast:  148 >> 88-203, 238 >> 135, 139, 193 >> 189 - lunch: 135-169 >> 183 >> 71, 82 >> n/c - 2h after lunch: 303 >> n/c >> 201 >> 104 >> n/c - dinner: 95-148, 201 >> 171, 178, 195 (steroids) >> n/c - 2h after dinner: 82-167, 249 >> 197, 210, 231 (steroids) >> 115-173 - bedtime: 180-226 (grapes) >> 100-158, 180 >> see above  >> 66, 97-197, 215 - night:   76, 137 >> n/c >> 184 >> 125, 128 Lowest sugar:  99 >> 51 >> 63 >> 63; she has hypoglycemia awareness in the 70s. Highest sugar: 300 (at the time of her fx) >> 249 >> 216 (no meds that day) >> 215.  Glucometer: One Touch Verio Flex  Pt's meals  are:  - Breakfast: smoothies >> belvita 1 package - Lunch: meat + veggies - mostly skips - Dinner: meat + veggies - Snacks: 1-2, fruit, yoghurt  She lost 80 pounds since 2012: >> Was able to come off insulin. She saw Oran Rein (nutritionist) on 01/01/2015.  -+ Mild CKD. Latest BUN/creatinine was:  Lab Results  Component Value Date   BUN 21 04/25/2019   Lab Results  Component Value Date   CREATININE 0.97 04/25/2019  On quinapril.  -+ HL.  Latest lipid profile: Lab Results  Component Value Date   CHOL 153 04/25/2019   HDL 42.30 04/25/2019   LDLCALC 86 04/25/2019   TRIG 121.0 04/25/2019   CHOLHDL 4 04/25/2019  On Lipitor. - last eye exam: 2019: Background DR.  + h/o B cataract sx. -No numbness and tingling in her legs.  She also has a history of HTN, iron deficiency anemia-on iron therapy, aortic valve disease/murmur, left ventricular hypertrophy per EKG, obesity, multinodular goiter, fibroids.  She had shingles in 11/2014 and 02/2016.  She fell and broke R wrist 06/2018. She had another, right radial, fracture 01/2019.  ROS: Constitutional: no weight gain/no weight loss, no fatigue, no subjective hyperthermia, no subjective hypothermia Eyes: no blurry vision, no xerophthalmia ENT: no sore throat, no  nodules palpated in neck, no dysphagia, no odynophagia, no hoarseness Cardiovascular: no CP/no SOB/no palpitations/no leg swelling Respiratory: no cough/no SOB/no wheezing Gastrointestinal: no N/no V/no D/no C/no acid reflux Musculoskeletal: no muscle aches/no joint aches Skin: no rashes, no hair loss Neurological: no tremors/no numbness/no tingling/no dizziness  I reviewed pt's medications, allergies, PMH, social hx, family hx, and changes were documented in the history of present illness. Otherwise, unchanged from my initial visit note.  Past Medical History:  Diagnosis Date  . Anemia   . Aortic stenosis    Mild  . Arthritis   . Cataract    both eyes  (cataracts  removed)  . Diabetes mellitus    sees Dr. Elvera LennoxGherghe   . Gynecological examination    sees Dr. Jennette KettleNeal   . Hyperlipidemia   . Hypertension    sees Dr. Angelina SheriffJake Hochrein  . Stroke [facility] [address]([facility])    TIA   Past Surgical History:  Procedure Laterality Date  . CARPAL TUNNEL RELEASE  01/22/14   Left Hand   . CATARACT EXTRACTION     Lens surgery  . COLONOSCOPY  02/06/2018   per Dr. Myrtie Neitheranis, no polyps, repeat in 5 yrs (colon CA in mother)   . excision of benign cyst     from left maxilla and left maxillary sinus  . KNEE SURGERY    . RIGHT/LEFT HEART CATH AND CORONARY ANGIOGRAPHY N/A 09/14/2018   Procedure: RIGHT/LEFT HEART CATH AND CORONARY ANGIOGRAPHY;  Surgeon: SwazilandJordan, Peter M, MD;  [facility];  Service: Cardiovascular;  Laterality: N/A;  . SHOULDER ARTHROSCOPY W/ ROTATOR CUFF REPAIR Left 09-23-13   [facility] [place]    Social History   Socioeconomic History  . Marital status: Married    Spouse name: Not on file  . Number of children: Not on file  . Years of education: Not on file  . Highest education level: Not on file  Occupational History  . Not on file  Social Needs  . Financial resource strain: Not on file  . Food insecurity    Worry: Not on file    Inability: Not on file  . Transportation needs    Medical: Not on file    Non-medical: Not on file  Tobacco Use  . Smoking status: Never Smoker  . Smokeless tobacco: Never Used  Substance and Sexual Activity  . Alcohol use: No    Alcohol/week: 0.0 standard drinks  . Drug use: No  . Sexual activity: Not on file  Lifestyle  . Physical activity    Days per week: Not on file    Minutes per session: Not on file  . Stress: Not on file  Relationships  . Social Musicianconnections    Talks on phone: Not on file    Gets together: Not on file    Attends religious service: Not on file    Active member of club or organization: Not on file    Attends meetings of clubs or organizations: Not  on file    Relationship status: Not on file  . Intimate partner violence    Fear of current or ex partner: Not on file    Emotionally abused: Not on file    Physically abused: Not on file    Forced sexual activity: Not on file  Other Topics Concern  . Not on file  Social History Narrative  . Not on file   Current Outpatient Medications on File Prior to Visit  Medication Sig Dispense Refill  . albuterol (PROVENTIL) (2.5 MG/3ML) 0.083% nebulizer solution Take 3 mLs (2.5 mg total) by nebulization every 4 (four) hours as needed for wheezing or shortness of breath. 120 mL 3  . albuterol (VENTOLIN HFA) 108 (90 Base) MCG/ACT inhaler USE 2 INHALATIONS EVERY 4 HOURS AS NEEDED FOR WHEEZING 54 g 3  . Alcohol Swabs PADS Use to check blood sugar once a day.  DX E11.319 200 each 1  . aspirin 81 MG tablet Take 81 mg by mouth at bedtime.     Marland Kitchen atorvastatin (LIPITOR) 40 MG tablet TAKE 1 TABLET AT BEDTIME 90 tablet 4  . bumetanide (BUMEX) 1 MG tablet TAKE 2 TABLETS AT BEDTIME 180 tablet 3  . cholecalciferol (VITAMIN D) 1000 UNITS tablet Take 2,000 Units by mouth at bedtime.     . cyclobenzaprine (FLEXERIL) 10 MG tablet Take 1 tablet (10 mg total) by mouth 3 (three) times daily as needed for muscle spasms. 60 tablet 2  . EPIPEN 2-PAK 0.3 MG/0.3ML SOAJ injection INJECT 0.3 MLS (0.3 MG TOTAL) INTO THE MUSCLE ONCE. 1 Device 0  . glimepiride (AMARYL) 4 MG tablet Take 1 tablet (4 mg total) by mouth daily with breakfast. 90 tablet 3  . glucose blood test strip USE TWICE A DAY 100 each 12  . hydrALAZINE (APRESOLINE) 50 MG tablet Take 1 tablet (50 mg total) by mouth 3 (three) times daily. 270 tablet 3  . Insulin Pen Needle (CAREFINE PEN NEEDLES) 32G X 4 MM MISC Use with victoza 100 each 1  . isosorbide mononitrate (IMDUR) 120 MG 24 hr tablet Take 1 tablet (120 mg total) by mouth daily. 90 tablet 3  . ketoconazole (NIZORAL) 2 % cream Apply 1 application topically daily. 30 g 5  . linagliptin (TRADJENTA) 5 MG TABS  tablet TAKE 1 TABLET BY MOUTH EVERY DAY IN THE MORNING 90 tablet 3  . metFORMIN (GLUCOPHAGE-XR) 500 MG 24 hr tablet TAKE 2 TABLETS TWICE A DAY WITH MEALS 360 tablet 3  . methocarbamol (ROBAXIN) 500 MG tablet Take 1 tablet (500 mg total) by mouth 2 (two) times daily as needed for muscle spasms. 20 tablet 0  . metoprolol succinate (TOPROL-XL) 100 MG 24 hr tablet TAKE 1 TABLET (100 MG TOTAL) BY MOUTH DAILY. TAKE WITH OR IMMEDIATELY FOLLOWING A MEAL. 90 tablet 3  . mupirocin ointment (BACTROBAN) 2 % Place 1 application into the nose 2 (two) times daily. 22 g 1  . NON FORMULARY     . ONETOUCH DELICA LANCETS 61Y MISC Use to check blood sugar twice a day.  DX  E11.319 200 each 1  . quinapril (ACCUPRIL) 20 MG tablet TAKE 1 TABLET AT BEDTIME (Patient taking differently: Take 20 mg by mouth 2 (two) times a day. ) 90 tablet 4   Current Facility-Administered Medications on File Prior to Visit  Medication Dose Route Frequency Provider Last Rate Last Dose  . 0.9 %  sodium chloride infusion  500 mL Intravenous Once Doran Stabler, MD       No Known Allergies Family History  Problem Relation Age of Onset  . Coronary artery disease Brother   . Coronary artery disease Mother   . Colon cancer Mother   . Cancer Other        fhx  . Diabetes Other        fhx  . Hyperlipidemia Other        fhx  . Hypertension Other  fhx  . Stroke Other        fhx  . Colon polyps Neg Hx   . Esophageal cancer Neg Hx   . Rectal cancer Neg Hx   . Stomach cancer Neg Hx     PE: BP 138/88   Pulse 74   Ht 5' 4.5" (1.638 m)   Wt 254 lb (115.2 kg)   SpO2 96%   BMI 42.93 kg/m  Body mass index is 42.93 kg/m.  Wt Readings from Last 3 Encounters:  07/08/19 254 lb (115.2 kg)  04/25/19 249 lb 9.6 oz (113.2 kg)  04/25/19 249 lb 9.6 oz (113.2 kg)   Constitutional: overweight, in NAD Eyes: PERRLA, EOMI, no exophthalmos ENT: moist mucous membranes, no thyromegaly, no cervical lymphadenopathy Cardiovascular: RRR,  No MRG Respiratory: CTA B Gastrointestinal: abdomen soft, NT, ND, BS+ Musculoskeletal: no deformities, strength intact in all 4 Skin: moist, warm, no rashes Neurological: no tremor with outstretched hands, DTR normal in all 4  ASSESSMENT: 1. DM2, non-insulin-dependent, uncontrolled, with complications - CKD stage 2 - background DR  2.  Obesity  3. HL  PLAN:  1.  Patient with longstanding, uncontrolled type 2 diabetes, on metformin, DPP 4 inhibitor, and sulfonylurea.  We tried SGLT2 inhibitors but she could not tolerate them due to yeast infections.  Her insurance did not cover weekly formulations of GLP-1 receptor agonist only Victoza.  She was afraid to start this due to side effects.  At last visit we discussed about reducing snacks and sugary foods between meals.  At last visit, sugars were excellent in the morning but they were increasing after meals, most likely due to steroid injections.  I advised her to add 2 mg of Amaryl before dinner since sugars were higher after this meal. -At this visit, however, she is telling me that she takes the Amaryl with or after dinner.  In those situations, sugars are high after dinner and they drop in the 60s in the morning.  We discussed about the importance of taking the Amaryl 30 minutes before dinner and she will try to do so.  I do not feel that she needs other changes since her sugars have significantly improved.  However, we may need to reduce the dose of Amaryl if she continues to have lower blood sugars in the morning. - I advised her to:  Patient Instructions  Please continue: - Metformin ER 1000 mg 2x a day with meals  - Amaryl 4 mg before breakfast +/- 2 mg but move this 30 min before dinner - Tradjenta 5 mg before breakfast  Please return in 4 months with your sugar log.   - we checked her HbA1c: 6.4% (improved) - advised to check sugars at different times of the day - 1-2x a day, rotating check times - advised for yearly eye exams >>  she is UTD - she already had a flu shot for this season - return to clinic in 3-4 months      2.  Obesity -She lost 17 pounds before last visit by decreasing portions and 14 pounds before the previous visit by increasing walking -She gained approximately 5 pounds during the coronavirus pandemic  3. HL -Reviewed latest lipid panel from 2 months ago: All fractions at goal: Lab Results  Component Value Date   CHOL 153 04/25/2019   HDL 42.30 04/25/2019   LDLCALC 86 04/25/2019   TRIG 121.0 04/25/2019   CHOLHDL 4 04/25/2019  -Continues Lipitor without side effects  Carlus Pavlov, MD  PhD [facility]

## 2019-07-11 ENCOUNTER — Other Ambulatory Visit: Payer: Self-pay

## 2019-07-11 MED ORDER — HYDRALAZINE HCL 50 MG PO TABS: 50.0000 mg | ORAL_TABLET | Freq: Three times a day (TID) | ORAL | 3 refills | Status: DC

## 2019-07-15 ENCOUNTER — Other Ambulatory Visit: Payer: Self-pay

## 2019-07-15 MED ORDER — HYDRALAZINE HCL 50 MG PO TABS: 50.0000 mg | ORAL_TABLET | Freq: Three times a day (TID) | ORAL | 3 refills | Status: DC

## 2019-07-15 NOTE — Telephone Encounter (Signed)
Rx(s) sent to pharmacy electronically.  

## 2019-07-22 ENCOUNTER — Other Ambulatory Visit: Payer: Self-pay | Admitting: Internal Medicine

## 2019-08-06 ENCOUNTER — Encounter: Payer: Self-pay | Admitting: Family Medicine

## 2019-08-07 NOTE — Telephone Encounter (Signed)
This is nothing to worry about. I think it is a lab error because this is showing up on a lot of my patients and I do not think it is real

## 2019-08-15 ENCOUNTER — Telehealth: Payer: Self-pay

## 2019-08-15 ENCOUNTER — Ambulatory Visit (HOSPITAL_COMMUNITY)
Admission: RE | Admit: 2019-08-15 | Discharge: 2019-08-15 | Disposition: A | Discharge status: Home / Self Care | Payer: Medicare Other | Source: Ambulatory Visit | Attending: Cardiology | Admitting: Cardiology

## 2019-08-15 ENCOUNTER — Other Ambulatory Visit: Payer: Self-pay

## 2019-08-15 DIAGNOSIS — R0989 Other specified symptoms and signs involving the circulatory and respiratory systems: Secondary | ICD-10-CM | POA: Diagnosis present

## 2019-08-15 NOTE — Telephone Encounter (Signed)
Tried to call and give carotid doppler results. VM is full. Unable to leave message.

## 2019-08-15 NOTE — Telephone Encounter (Signed)
-----   Message from Minus Breeding, MD sent at 08/15/2019  1:30 PM EST ----- Mild carotid plaque.  No need for follow up imaging at this point.  Call Ms. Loy with the results and send results to Laurey Morale, MD

## 2019-09-16 ENCOUNTER — Other Ambulatory Visit: Payer: Self-pay

## 2019-09-16 MED ORDER — HYDRALAZINE HCL 50 MG PO TABS: 50.0000 mg | ORAL_TABLET | Freq: Three times a day (TID) | ORAL | 1 refills | Status: DC

## 2019-09-18 ENCOUNTER — Other Ambulatory Visit: Payer: Self-pay

## 2019-09-18 MED ORDER — HYDRALAZINE HCL 50 MG PO TABS: 50.0000 mg | ORAL_TABLET | Freq: Three times a day (TID) | ORAL | 2 refills | Status: DC

## 2019-09-20 ENCOUNTER — Telehealth: Payer: Self-pay | Admitting: *Deleted

## 2019-09-20 NOTE — Telephone Encounter (Signed)
Spoke to pt and she wanted to know what COPD was and how if it compared to bronchitis. Pt was asked to attend a funeral and wanted to make sure it was okay to attend. I spoke to pt and discuss COPD and bronchitis. Pt verbalized understanding. I also advised pt that I was unable to answer whether it would be okay for her to attend the funeral I advised that it was her decision to make. Pt verbalized understanding. No further action needed per pt.

## 2019-09-20 NOTE — Telephone Encounter (Signed)
Copied from [place] [id number]. Topic: General - Other >> Sep 20, 2019  8:51 AM Leward Quan A wrote: Reason for CRM: Patient called to request a call back from Dr Sarajane Jews in reference to her Dx of Bronchitis or COPD she is not sure and need some clarification. Please call patient at Ph# [phone]

## 2019-10-25 ENCOUNTER — Telehealth: Payer: Self-pay | Admitting: Cardiology

## 2019-10-25 ENCOUNTER — Other Ambulatory Visit: Payer: Self-pay | Admitting: Internal Medicine

## 2019-10-25 NOTE — Telephone Encounter (Signed)
We are recommending the COVID-19 vaccine to all of our patients. Cardiac medications (including blood thinners) should not deter anyone from being vaccinated and there is no need to hold any of those medications prior to vaccine administration. Currently, there is a hotline to call (active 10/18/19) to schedule vaccination appointments as no walk-ins will be accepted. Number: [phone] If you have further questions or concerns about the vaccine process, please visit [email or web address] or contact your primary care physician.  Pt verbalized understanding

## 2019-10-25 NOTE — Telephone Encounter (Signed)
New Message:     Pt wants to know if Dr Antoine Poche think  she should have the COVID Vaccine?i

## 2019-10-28 ENCOUNTER — Telehealth: Payer: Self-pay

## 2019-10-28 NOTE — Telephone Encounter (Signed)
Copied from CRM [id number]. Topic: General - Other >> Oct 28, 2019 12:34 PM Tamela Oddi wrote: Reason for CRM: Patient would like to talk with the doctor regarding the COVID vaccine.  CB# [phone]

## 2019-10-29 NOTE — Telephone Encounter (Signed)
She should get the vaccine and there are no special precautions

## 2019-11-08 ENCOUNTER — Other Ambulatory Visit: Payer: Self-pay

## 2019-11-12 ENCOUNTER — Ambulatory Visit (INDEPENDENT_AMBULATORY_CARE_PROVIDER_SITE_OTHER): Payer: Medicare Other | Admitting: Internal Medicine

## 2019-11-12 ENCOUNTER — Encounter: Payer: Self-pay | Admitting: Internal Medicine

## 2019-11-12 ENCOUNTER — Other Ambulatory Visit: Payer: Self-pay

## 2019-11-12 VITALS — BP 138/90 | HR 68 | Ht 64.5 in | Wt 263.0 lb

## 2019-11-12 DIAGNOSIS — IMO0002 Reserved for concepts with insufficient information to code with codable children: Secondary | ICD-10-CM

## 2019-11-12 DIAGNOSIS — E11319 Type 2 diabetes mellitus with unspecified diabetic retinopathy without macular edema: Secondary | ICD-10-CM

## 2019-11-12 DIAGNOSIS — E1165 Type 2 diabetes mellitus with hyperglycemia: Secondary | ICD-10-CM

## 2019-11-12 DIAGNOSIS — E669 Obesity, unspecified: Secondary | ICD-10-CM | POA: Diagnosis not present

## 2019-11-12 DIAGNOSIS — E782 Mixed hyperlipidemia: Secondary | ICD-10-CM

## 2019-11-12 LAB — POCT GLYCOSYLATED HEMOGLOBIN (HGB A1C): Hemoglobin A1C: 6.5 % — AB (ref 4.0–5.6)

## 2019-11-12 MED ORDER — ALCOHOL SWABS PADS: MEDICATED_PAD | 3 refills | Status: DC

## 2019-11-12 NOTE — Progress Notes (Signed)
Patient ID: Krista Benjamin, female   DOB: [birth year], 69 y.o.   MRN: [id number]  This visit occurred during the SARS-CoV-2 public health emergency.  Safety protocols were in place, including screening questions prior to the visit, additional usage of staff PPE, and extensive cleaning of exam room while observing appropriate contact time as indicated for disinfecting solutions.   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  Last visit 4 months ago. She has [employer] + [employer].   At last visit, she was working on diet, cutting down portions. She is back to work >> 7-8 am and 2-3 pm.  She relaxed her diet and gained weight since last visit.  Reviewed HbA1c levels: Lab Results  Component Value Date   HGBA1C 6.4 (A) 07/08/2019   HGBA1C 7.3 (H) 04/25/2019   HGBA1C 7.1 (A) 11/09/2018  Prev. 5.9% on 12/2012, previously 6.1%.  Pt is on a regimen of: - Metformin ER 1000 mg 2x a day with meals  - Amaryl 4 mg before breakfast +/-2 mg before dinner - Tradjenta 5 mg before breakfast Reviewed previous medications tried: Rec'd GLP1 agonist >> Victoza covered >> she was afraid of SEs. We tried Jardiance 10 mg daily in am - added 04/2017 >> yeast inf >> stopped We tried Invokana 100 mg in am >> stopped after 3 days >> nausea, diarrhea We stopped Lantus 10 units qhs. We stopped Actos 45 mg (was on it since 07/2001) Metformin IR 1000 mg bid >> stopped b/c Nausea/Diarrhea She was on Novolog 8 units tid ac, when sugars >140   Pt checks her sugars 1-2 times a day: - am: 51-138, 151 >> 77-113, 167 >> 63-97, 106 >> 66, 70-123, 145, 183 - 2h after b'fast:  88-203, 238 >> 135, 139, 193 >> 189 >> 115 - lunch: 135-169 >> 183 >> 71, 82 >> n/c - 2h after lunch: 303 >> n/c >> 201 >> 104 >> n/c - dinner: 95-148, 201 >> 171, 178, 195 (steroids) >> n/c - 2h after dinner: 197, 210, 231  (steroids) >> 115-173 >> n/c - bedtime:100-158, 180 >> see above  >> 66, 97-197, 215 >> n/c - night:   76, 137 >> n/c >> 184 >> 125, 128 >> n/c Lowest sugar:  51 >> 63 >> 63 >> 58 in 07/2019; she has hypoglycemia awareness in the 70s. Highest sugar: 300 (at the time of her fx) >> .Marland Kitchen.215 >> 181.  Glucometer: One Touch Verio Flex  Pt's meals are:  - Breakfast: smoothies >> belvita 1 package - Lunch: meat + veggies - mostly skips - Dinner: meat + veggies - Snacks: 1-2, fruit, yoghurt  She lost 80 pounds since 2012: >> Was able to come off insulin. She saw Oran Rein (nutritionist) on 01/01/2015.  -+ Mild CKD. Latest BUN/creatinine was:  Lab Results  Component Value Date   BUN 21 04/25/2019   Lab Results  Component Value Date   CREATININE 0.97 04/25/2019  On quinapril.  -+ HL.  Latest lipid profile: Lab Results  Component Value Date   CHOL 153 04/25/2019   HDL 42.30 04/25/2019   LDLCALC 86 04/25/2019   TRIG 121.0 04/25/2019   CHOLHDL 4 04/25/2019  On Lipitor. - last eye exam: 2019: Background DR.  + h/o B cataract sx. - no numbness and tingling in her legs.  She also has a history of HTN, iron deficiency anemia-on iron therapy, aortic valve  disease/murmur, left ventricular hypertrophy per EKG, obesity, multinodular goiter, fibroids.  She had shingles in 11/2014 and 02/2016.  She fell and broke R wrist 06/2018. She had another, right radial, fracture 01/2019.  ROS: Constitutional: + weight gain/no weight loss, no fatigue, no subjective hyperthermia, no subjective hypothermia Eyes: no blurry vision, no xerophthalmia ENT: no sore throat, no nodules palpated in neck, no dysphagia, no odynophagia, no hoarseness Cardiovascular: no CP/no SOB/no palpitations/no leg swelling Respiratory: no cough/no SOB/no wheezing Gastrointestinal: no N/no V/no D/no C/no acid reflux Musculoskeletal: no muscle aches/no joint aches Skin: no rashes, no hair loss Neurological: no tremors/no  numbness/no tingling/no dizziness  I reviewed pt's medications, allergies, PMH, social hx, family hx, and changes were documented in the history of present illness. Otherwise, unchanged from my initial visit note.  Past Medical History:  Diagnosis Date  . Anemia   . Aortic stenosis    Mild  . Arthritis   . Cataract    both eyes  (cataracts removed)  . Diabetes mellitus    sees Dr. Elvera Lennox   . Gynecological examination    sees Dr. Jennette Kettle   . Hyperlipidemia   . Hypertension    sees Dr. Angelina Sheriff  . Stroke [facility])    TIA   Past Surgical History:  Procedure Laterality Date  . CARPAL TUNNEL RELEASE  01/22/14   Left Hand   . CATARACT EXTRACTION     Lens surgery  . COLONOSCOPY  02/06/2018   per Dr. Myrtie Neither, no polyps, repeat in 5 yrs (colon CA in mother)   . excision of benign cyst     from left maxilla and left maxillary sinus  . KNEE SURGERY    . RIGHT/LEFT HEART CATH AND CORONARY ANGIOGRAPHY N/A 09/14/2018   Procedure: RIGHT/LEFT HEART CATH AND CORONARY ANGIOGRAPHY;  Surgeon: Swaziland, Peter M, MD;  Location: [facility] INVASIVE CV LAB;  Service: Cardiovascular;  Laterality: N/A;  . SHOULDER ARTHROSCOPY W/ ROTATOR CUFF REPAIR Left 09-23-13   per Dr. Delrae Sawyers at [facility] center in [place]    Social History   Socioeconomic History  . Marital status: Married    Spouse name: Not on file  . Number of children: Not on file  . Years of education: Not on file  . Highest education level: Not on file  Occupational History  . Not on file  Tobacco Use  . Smoking status: Never Smoker  . Smokeless tobacco: Never Used  Substance and Sexual Activity  . Alcohol use: No    Alcohol/week: 0.0 standard drinks  . Drug use: No  . Sexual activity: Not on file  Other Topics Concern  . Not on file  Social History Narrative  . Not on file   Social Determinants of Health   Financial Resource Strain:   . Difficulty of Paying Living Expenses: Not on file  Food Insecurity:   . Worried  About Programme researcher, broadcasting/film/video in the Last Year: Not on file  . Ran Out of Food in the Last Year: Not on file  Transportation Needs:   . Lack of Transportation (Medical): Not on file  . Lack of Transportation (Non-Medical): Not on file  Physical Activity:   . Days of Exercise per Week: Not on file  . Minutes of Exercise per Session: Not on file  Stress:   . Feeling of Stress : Not on file  Social Connections:   . Frequency of Communication with Friends and Family: Not on file  . Frequency of Social Gatherings  with Friends and Family: Not on file  . Attends Religious Services: Not on file  . Active Member of Clubs or Organizations: Not on file  . Attends Banker Meetings: Not on file  . Marital Status: Not on file  Intimate Partner Violence:   . Fear of Current or Ex-Partner: Not on file  . Emotionally Abused: Not on file  . Physically Abused: Not on file  . Sexually Abused: Not on file   Current Outpatient Medications on File Prior to Visit  Medication Sig Dispense Refill  . albuterol (PROVENTIL) (2.5 MG/3ML) 0.083% nebulizer solution Take 3 mLs (2.5 mg total) by nebulization every 4 (four) hours as needed for wheezing or shortness of breath. 120 mL 3  . albuterol (VENTOLIN HFA) 108 (90 Base) MCG/ACT inhaler USE 2 INHALATIONS EVERY 4 HOURS AS NEEDED FOR WHEEZING 54 g 3  . Alcohol Swabs PADS Use to check blood sugar once a day.  DX E11.319 200 each 1  . aspirin 81 MG tablet Take 81 mg by mouth at bedtime.     Marland Kitchen atorvastatin (LIPITOR) 40 MG tablet TAKE 1 TABLET AT BEDTIME 90 tablet 4  . bumetanide (BUMEX) 1 MG tablet TAKE 2 TABLETS AT BEDTIME 180 tablet 3  . cholecalciferol (VITAMIN D) 1000 UNITS tablet Take 2,000 Units by mouth at bedtime.     . cyclobenzaprine (FLEXERIL) 10 MG tablet Take 1 tablet (10 mg total) by mouth 3 (three) times daily as needed for muscle spasms. 60 tablet 2  . EPIPEN 2-PAK 0.3 MG/0.3ML SOAJ injection INJECT 0.3 MLS (0.3 MG TOTAL) INTO THE MUSCLE ONCE.  1 Device 0  . glimepiride (AMARYL) 4 MG tablet Take 4 mg in am and 2 mg in pm 120 tablet 3  . hydrALAZINE (APRESOLINE) 50 MG tablet Take 1 tablet (50 mg total) by mouth 3 (three) times daily. 270 tablet 2  . Insulin Pen Needle (CAREFINE PEN NEEDLES) 32G X 4 MM MISC Use with victoza 100 each 1  . isosorbide mononitrate (IMDUR) 120 MG 24 hr tablet Take 1 tablet (120 mg total) by mouth daily. 90 tablet 3  . ketoconazole (NIZORAL) 2 % cream Apply 1 application topically daily. 30 g 5  . linagliptin (TRADJENTA) 5 MG TABS tablet TAKE 1 TABLET BY MOUTH EVERY DAY IN THE MORNING 90 tablet 3  . metFORMIN (GLUCOPHAGE-XR) 500 MG 24 hr tablet TAKE 2 TABLETS TWICE A DAY WITH MEALS 360 tablet 3  . methocarbamol (ROBAXIN) 500 MG tablet Take 1 tablet (500 mg total) by mouth 2 (two) times daily as needed for muscle spasms. 20 tablet 0  . metoprolol succinate (TOPROL-XL) 100 MG 24 hr tablet TAKE 1 TABLET (100 MG TOTAL) BY MOUTH DAILY. TAKE WITH OR IMMEDIATELY FOLLOWING A MEAL. 90 tablet 3  . mupirocin ointment (BACTROBAN) 2 % Place 1 application into the nose 2 (two) times daily. 22 g 1  . NON FORMULARY     . ONETOUCH DELICA LANCETS 33G MISC Use to check blood sugar twice a day.  DX  E11.319 200 each 1  . quinapril (ACCUPRIL) 20 MG tablet TAKE 1 TABLET AT BEDTIME (Patient taking differently: Take 20 mg by mouth 2 (two) times a day. ) 90 tablet 4  . TRUE METRIX BLOOD GLUCOSE TEST test strip USE AS DIRECTED TWICE A DAY 200 strip 12   Current Facility-Administered Medications on File Prior to Visit  Medication Dose Route Frequency Provider Last Rate Last Admin  . 0.9 %  sodium chloride infusion  500 mL Intravenous Once Doran Stabler, MD       No Known Allergies Family History  Problem Relation Age of Onset  . Coronary artery disease Brother   . Coronary artery disease Mother   . Colon cancer Mother   . Cancer Other        fhx  . Diabetes Other        fhx  . Hyperlipidemia Other        fhx  .  Hypertension Other        fhx  . Stroke Other        fhx  . Colon polyps Neg Hx   . Esophageal cancer Neg Hx   . Rectal cancer Neg Hx   . Stomach cancer Neg Hx     PE: BP 138/90   Pulse 68   Ht 5' 4.5" (1.638 m)   Wt 263 lb (119.3 kg)   SpO2 98%   BMI 44.45 kg/m  Body mass index is 44.45 kg/m.  Wt Readings from Last 3 Encounters:  11/12/19 263 lb (119.3 kg)  07/08/19 254 lb (115.2 kg)  04/25/19 249 lb 9.6 oz (113.2 kg)   Constitutional: overweight, in NAD Eyes: PERRLA, EOMI, no exophthalmos ENT: moist mucous membranes, no thyromegaly, no cervical lymphadenopathy Cardiovascular: RRR, No MRG Respiratory: CTA B Gastrointestinal: abdomen soft, NT, ND, BS+ Musculoskeletal: no deformities, strength intact in all 4 Skin: moist, warm, no rashes Neurological: no tremor with outstretched hands, DTR normal in all 4  ASSESSMENT: 1. DM2, non-insulin-dependent, uncontrolled, with complications - CKD stage 2 - background DR  2.  Obesity class III  3. HL  PLAN:  1.  Patient with longstanding, uncontrolled, type 2 diabetes, on Metformin, DPP 4 inhibitor, and sulfonylurea.  She could not tolerate SGLT2 inhibitors due to yeast infections.  Her insurance did not cover weekly formulations of GLP-1 receptor agonist, only Victoza.  However, she was afraid to start this due to possible side effects.  At last visit, sugars were significantly improved and we discussed about moving her second dose of sulfonylurea before dinner, and she was taking it with or after dinner.  We did not make any other changes in her regimen.  Her HbA1c is was 6.4% -At this visit, sugars are still good in the morning with only occasional hyperglycemic spikes, but she also had 1 blood sugar at 68 in 07/2019 and few in the 60s.  Therefore, we will go ahead and stop the dose of Amaryl before dinner but we will continue the rest of the regimen since I do not have blood sugars from later in the day.  I did advise him to  check some before she returns next to see if we can decrease the dose of Amaryl in the morning at that time.  For now, we will continue Metformin and Tradjenta at the current doses - I advised her to:  Patient Instructions  Please continue: - Metformin ER 1000 mg 2x a day with meals  - Amaryl 4 mg before breakfast but stop the dose before dinner - Tradjenta 5 mg before breakfast  Please return in 4 months with your sugar log.   - we checked her HbA1c: 6.5% (slightly higher) - advised to check sugars at different times of the day - 1-2x a day, rotating check times - advised for yearly eye exams >> she is not UTD - return to clinic in 4 months      2.  Obesity class III -  She lost ~30 pounds in the last year by increasing walking, but gained 5 pounds before last visit due to the coronavirus pandemic - gained almost 9 lbs since last visit  3. HL -Reviewed latest lipid panel from 04/2019: Fractions at goal except LDL slightly higher than goal of less than 7 Lab Results  Component Value Date   CHOL 153 04/25/2019   HDL 42.30 04/25/2019   LDLCALC 86 04/25/2019   TRIG 121.0 04/25/2019   CHOLHDL 4 04/25/2019  -Continues Lipitor without side effects  Carlus Pavlov, MD PhD [facility] Endocrinology

## 2019-11-12 NOTE — Addendum Note (Signed)
Addended by: Darliss Ridgel I on: 11/12/2019 10:34 AM   Modules accepted: Orders

## 2019-11-12 NOTE — Patient Instructions (Addendum)
Please continue: - Metformin ER 1000 mg 2x a day with meals  - Amaryl 4 mg before breakfast but stop the dose before dinner - Tradjenta 5 mg before breakfast  Please return in 4 months with your sugar log.

## 2019-11-17 ENCOUNTER — Other Ambulatory Visit: Payer: Self-pay | Admitting: Family Medicine

## 2019-11-22 ENCOUNTER — Telehealth: Payer: Self-pay | Admitting: Family Medicine

## 2019-11-22 NOTE — Telephone Encounter (Signed)
Patient would like for Dr. Clent Ridges to call her in reference to questions about the COVID vaccinations.  Please advise

## 2020-01-01 ENCOUNTER — Other Ambulatory Visit: Payer: Self-pay | Admitting: Family Medicine

## 2020-01-27 ENCOUNTER — Telehealth: Payer: Self-pay | Admitting: Internal Medicine

## 2020-01-27 NOTE — Telephone Encounter (Signed)
Patient calling requesting to switch to an alternative medication instead of taking the TRADJENTA due to insurance purposes. I did explain if she called her insurance and asked what they covered as an alternative and she will call us back, that would help. Ph# [phone]

## 2020-01-28 MED ORDER — SITAGLIPTIN PHOSPHATE 100 MG PO TABS: 100.0000 mg | ORAL_TABLET | Freq: Every day | ORAL | 1 refills | Status: DC

## 2020-01-28 NOTE — Telephone Encounter (Signed)
We can try Januvia 100 mg if covered

## 2020-01-28 NOTE — Addendum Note (Signed)
Addended by: Darliss Ridgel I on: 01/28/2020 01:17 PM   Modules accepted: Orders

## 2020-01-28 NOTE — Telephone Encounter (Signed)
RX sent

## 2020-02-05 ENCOUNTER — Telehealth: Payer: Self-pay | Admitting: Internal Medicine

## 2020-02-05 NOTE — Telephone Encounter (Signed)
She did not take Januvia before, she took [place].  This was the one that caused yeast infections.  Januvia does not have this is a possible side effect.

## 2020-02-05 NOTE — Telephone Encounter (Signed)
Patient called re: Patient requests to be called at ph# [phone] re: RX that was sent to [facility] for sitaGLIPtin (JANUVIA) 100 MG tablet. Patient states that when she took Januvia before it caused patient to have yeast infection. Patient would like to discuss alternative medications that do not cause side effects.

## 2020-02-06 ENCOUNTER — Other Ambulatory Visit: Payer: Self-pay | Admitting: Family Medicine

## 2020-02-11 ENCOUNTER — Other Ambulatory Visit: Payer: Self-pay

## 2020-02-11 MED ORDER — SITAGLIPTIN PHOSPHATE 100 MG PO TABS: 100.0000 mg | ORAL_TABLET | Freq: Every day | ORAL | 2 refills | Status: DC

## 2020-02-15 ENCOUNTER — Other Ambulatory Visit: Payer: Self-pay | Admitting: Cardiology

## 2020-02-26 DIAGNOSIS — Z7189 Other specified counseling: Secondary | ICD-10-CM | POA: Insufficient documentation

## 2020-02-26 NOTE — Progress Notes (Signed)
Cardiology Office Note   Date:  02/27/2020   ID:  Krista Benjamin, DOB [birth year], MRN [id number]  PCP:  Nelwyn Salisbury, MD  Cardiologist:   Rollene Rotunda, MD   Chief Complaint  Patient presents with  . Cardiomyopathy      History of Present Illness: Krista Benjamin is a 69 y.o. female who presents for followup of a mildly reduced  EF 40 - 45%.   She had a cath last year and had minimal CAD.   Her EF a couple of days ago on echo was lower at 35 - 40%.      Since I last saw her she has done okay.  However, she sleeps a lot.  She works as a Chief Strategy Officer.  She is able to do this.  She does have some chronic dyspnea like climbing a flight of stairs.  However, this has been only slowly progressive.  She has not had any new shortness of breath, PND or orthopnea.  She is chronically slept in a chair just for comfort.  She has some mild occasional lower extremity swelling.  She is not having any chest pressure, neck or arm discomfort.  Her weights have been steady.  Past Medical History:  Diagnosis Date  . Anemia   . Aortic stenosis    Mild  . Arthritis   . Cataract    both eyes  (cataracts removed)  . Diabetes mellitus    sees Dr. Elvera Lennox   . Gynecological examination    sees Dr. Jennette Kettle   . Hyperlipidemia   . Hypertension    sees Dr. Angelina Sheriff  . Stroke [facility])    TIA    Past Surgical History:  Procedure Laterality Date  . CARPAL TUNNEL RELEASE  01/22/14   Left Hand   . CATARACT EXTRACTION     Lens surgery  . COLONOSCOPY  02/06/2018   per Dr. Myrtie Neither, no polyps, repeat in 5 yrs (colon CA in mother)   . excision of benign cyst     from left maxilla and left maxillary sinus  . KNEE SURGERY    . RIGHT/LEFT HEART CATH AND CORONARY ANGIOGRAPHY N/A 09/14/2018   Procedure: RIGHT/LEFT HEART CATH AND CORONARY ANGIOGRAPHY;  Surgeon: Swaziland, Peter M, MD;  Location: [facility] INVASIVE CV LAB;  Service: Cardiovascular;  Laterality: N/A;  . SHOULDER ARTHROSCOPY W/ ROTATOR  CUFF REPAIR Left 09-23-13   per Dr. Delrae Sawyers at [facility] center in [place]      Current Outpatient Medications  Medication Sig Dispense Refill  . albuterol (PROVENTIL) (2.5 MG/3ML) 0.083% nebulizer solution Take 3 mLs (2.5 mg total) by nebulization every 4 (four) hours as needed for wheezing or shortness of breath. 120 mL 3  . albuterol (VENTOLIN HFA) 108 (90 Base) MCG/ACT inhaler USE 2 INHALATIONS EVERY 4 HOURS AS NEEDED FOR WHEEZING 54 g 3  . Alcohol Swabs PADS Use to check blood sugar once a day.  DX E11.319 200 each 3  . aspirin 81 MG tablet Take 81 mg by mouth at bedtime.     Marland Kitchen atorvastatin (LIPITOR) 40 MG tablet TAKE 1 TABLET AT BEDTIME 90 tablet 0  . bumetanide (BUMEX) 1 MG tablet TAKE 2 TABLETS AT BEDTIME 180 tablet 3  . cholecalciferol (VITAMIN D) 1000 UNITS tablet Take 2,000 Units by mouth at bedtime.     . cyclobenzaprine (FLEXERIL) 10 MG tablet Take 1 tablet (10 mg total) by mouth 3 (three) times daily as needed for muscle spasms. 60  tablet 2  . EPIPEN 2-PAK 0.3 MG/0.3ML SOAJ injection INJECT 0.3 MLS (0.3 MG TOTAL) INTO THE MUSCLE ONCE. 1 Device 0  . glimepiride (AMARYL) 4 MG tablet Take 4 mg in am and 2 mg in pm 120 tablet 3  . Insulin Pen Needle (CAREFINE PEN NEEDLES) 32G X 4 MM MISC Use with victoza 100 each 1  . isosorbide mononitrate (IMDUR) 120 MG 24 hr tablet Take 1 tablet (120 mg total) by mouth daily. 90 tablet 3  . ketoconazole (NIZORAL) 2 % cream Apply 1 application topically daily. 30 g 5  . metFORMIN (GLUCOPHAGE-XR) 500 MG 24 hr tablet TAKE 2 TABLETS TWICE A DAY WITH MEALS 360 tablet 3  . methocarbamol (ROBAXIN) 500 MG tablet Take 1 tablet (500 mg total) by mouth 2 (two) times daily as needed for muscle spasms. 20 tablet 0  . metoprolol succinate (TOPROL-XL) 100 MG 24 hr tablet TAKE 1 TABLET (100 MG TOTAL) BY MOUTH DAILY. TAKE WITH OR IMMEDIATELY FOLLOWING A MEAL. 90 tablet 3  . mupirocin ointment (BACTROBAN) 2 % Place 1 application into the nose 2 (two)  times daily. 22 g 1  . NON FORMULARY     . ONETOUCH DELICA LANCETS 00X MISC Use to check blood sugar twice a day.  DX  E11.319 200 each 1  . sitaGLIPtin (JANUVIA) 100 MG tablet Take 1 tablet (100 mg total) by mouth daily. 90 tablet 2  . TRUE METRIX BLOOD GLUCOSE TEST test strip USE AS DIRECTED TWICE A DAY 200 strip 12  . hydrALAZINE (APRESOLINE) 50 MG tablet Take 1 tablet (50 mg total) by mouth 3 (three) times daily. 270 tablet 3  . sacubitril-valsartan (ENTRESTO) 49-51 MG Take 1 tablet by mouth 2 (two) times daily. 60 tablet 0   Current Facility-Administered Medications  Medication Dose Route Frequency Provider Last Rate Last Admin  . 0.9 %  sodium chloride infusion  500 mL Intravenous Once Doran Stabler, MD        Allergies:   Patient has no known allergies.    ROS:  Please see the history of present illness.   Otherwise, review of systems are positive for none.   All other systems are reviewed and negative.    PHYSICAL EXAM: VS:  BP (!) 146/74   Pulse 67   Temp (!) 96.2 F (35.7 C)   Resp 16   Ht 5' 4.5" (1.638 m)   Wt 256 lb 12.8 oz (116.5 kg)   SpO2 98%   BMI 43.40 kg/m  , BMI Body mass index is 43.4 kg/m. GENERAL:  Well appearing NECK:  No jugular venous distention, waveform within normal limits, carotid upstroke brisk and symmetric, no bruits, no thyromegaly LUNGS:  Clear to auscultation bilaterally CHEST:  Unremarkable HEART:  PMI not displaced or sustained,S1 and S2 within normal limits, no S3, no S4, no clicks, no rubs, 2 out of 6 apical systolic murmur radiating slightly to the aortic outflow tract, no diastolic murmurs ABD:  Flat, positive bowel sounds normal in frequency in pitch, no bruits, no rebound, no guarding, no midline pulsatile mass, no hepatomegaly, no splenomegaly EXT:  2 plus pulses throughout, no edema, no cyanosis no clubbing   EKG:  EKG is ordered today. The ekg ordered today demonstrates sinus rhythm, left bundle branch block, left axis  deviation, no change from previous.   Recent Labs: 04/25/2019: ALT 9; BUN 21; Creatinine, Ser 0.97; Hemoglobin 11.5; Platelets 251.0; Potassium 4.5; Sodium 142; TSH 1.14    Lipid  Panel    Component Value Date/Time   CHOL 153 04/25/2019 0932   TRIG 121.0 04/25/2019 0932   HDL 42.30 04/25/2019 0932   CHOLHDL 4 04/25/2019 0932   VLDL 24.2 04/25/2019 0932   LDLCALC 86 04/25/2019 0932      Wt Readings from Last 3 Encounters:  02/27/20 256 lb 12.8 oz (116.5 kg)  11/12/19 263 lb (119.3 kg)  07/08/19 254 lb (115.2 kg)      Other studies Reviewed: Additional studies/ records that were reviewed today include: Labs. Review of the above records demonstrates:  Please see elsewhere in the note.      ASSESSMENT AND PLAN:   CARDIOMYOPATHY:  Today I am going to try to switch her to [facility].  She has normal renal function.  Her blood pressure should tolerate this.  We will see her back in about a month for med titration.  After we get to target meds we can repeat an echocardiogram.  HTN:   This will be treated in the context of managing her cardiomyopathy.  OBESITY:She has lost a little weight.  I encouraged more the same and applauded her efforts.  DM:  Her A1c is down from 7.1-6.5.  Further management per Dr. Clent Ridges.   BRUIT: She had mild carotid plaque.  No further imaging.   COVID-19 EDUCATION:    She has had her vaccinations.  Current medicines are reviewed at length with the patient today.  The patient does not have concerns regarding medicines.  The following changes have been made: As above  Labs/ tests ordered today include:   Orders Placed This Encounter  Procedures  . EKG 12-Lead     Disposition:   FU with Azalee Course PA in 1 month   Signed, Rollene Rotunda, MD  02/27/2020 10:02 AM    [facility]

## 2020-02-27 ENCOUNTER — Encounter: Payer: Self-pay | Admitting: Cardiology

## 2020-02-27 ENCOUNTER — Ambulatory Visit: Payer: Medicare Other | Admitting: Cardiology

## 2020-02-27 ENCOUNTER — Other Ambulatory Visit: Payer: Self-pay

## 2020-02-27 VITALS — BP 146/74 | HR 67 | Temp 96.2°F | Resp 16 | Ht 64.5 in | Wt 256.8 lb

## 2020-02-27 DIAGNOSIS — Z7189 Other specified counseling: Secondary | ICD-10-CM | POA: Diagnosis not present

## 2020-02-27 DIAGNOSIS — I10 Essential (primary) hypertension: Secondary | ICD-10-CM | POA: Diagnosis not present

## 2020-02-27 DIAGNOSIS — I429 Cardiomyopathy, unspecified: Secondary | ICD-10-CM | POA: Diagnosis not present

## 2020-02-27 DIAGNOSIS — E119 Type 2 diabetes mellitus without complications: Secondary | ICD-10-CM

## 2020-02-27 MED ORDER — ISOSORBIDE MONONITRATE ER 120 MG PO TB24: 120.0000 mg | ORAL_TABLET | Freq: Every day | ORAL | 3 refills | Status: DC

## 2020-02-27 MED ORDER — SACUBITRIL-VALSARTAN 49-51 MG PO TABS: 1.0000 | ORAL_TABLET | Freq: Two times a day (BID) | ORAL | 3 refills | Status: DC

## 2020-02-27 MED ORDER — HYDRALAZINE HCL 50 MG PO TABS: 50.0000 mg | ORAL_TABLET | Freq: Three times a day (TID) | ORAL | 3 refills | Status: DC

## 2020-02-27 MED ORDER — SACUBITRIL-VALSARTAN 49-51 MG PO TABS: 1.0000 | ORAL_TABLET | Freq: Two times a day (BID) | ORAL | 0 refills | Status: DC

## 2020-02-27 NOTE — Patient Instructions (Signed)
Medication Instructions:  STOP QUINAPRIL 36 HOURS AFTER STOPPING QUINAPRIL START ENTRESTO 49-51, 1 TABLET TWICE A DAY MEDICATIONS REFILLED *If you need a refill on your cardiac medications before your next appointment, please call your pharmacy*  Lab Work: NONE ORDERED THIS VISIT  Testing/Procedures: NONE ORDERED THIS VISIT  Follow-Up: At [facility], you and your health needs are our priority.  As part of our continuing mission to provide you with exceptional heart care, we have created designated Provider Care Teams.  These Care Teams include your primary Cardiologist (physician) and Advanced Practice Providers (APPs -  Physician Assistants and Nurse Practitioners) who all work together to provide you with the care you need, when you need it.  Your next appointment:   1 month(s)  The format for your next appointment:   In Person  Provider:   HAO MENG, PAC

## 2020-03-04 ENCOUNTER — Telehealth: Payer: Self-pay | Admitting: Cardiology

## 2020-03-04 NOTE — Telephone Encounter (Signed)
New Message   Patient is calling because Dr. Antoine Poche provided her with some patient assistance forms for the [facility] and she has misplaced them. She would like them to be sent to her via mail or she can pick up. Please call to discuss.

## 2020-03-10 ENCOUNTER — Telehealth: Payer: Self-pay | Admitting: *Deleted

## 2020-03-10 NOTE — Telephone Encounter (Signed)
Spoke with patient. Informed patient Dr. Antoine Poche is not in the office to sign application and application will not be ready until next week. Patient verbalized understanding.

## 2020-03-10 NOTE — Telephone Encounter (Signed)
Patient is calling to check on status of the patient assistance form she dropped off.  Please call.

## 2020-03-10 NOTE — Telephone Encounter (Signed)
Patient spoke with Nurse Triage 03/09/2020. Patient reports she is having sinus pain that started this afternoon and wants a prescription. No nasal drainage. No cough or congestion. No other symptoms.  Patient was advised by nurse triage to HOME CARE: * You should be able to treat this at home. PAIN MEDICINES: * For pain relief, you can take either acetaminophen, ibuprofen, or naproxen. * They are over-the-counter (OTC) pain drugs. You can buy them at the drugstore. REASSURANCE AND EDUCATION: * Pain in the face can be caused by many things, including your sinuses, your teeth, and your jaw joint (TMJ). * If this pain persists, you will need to be examined by your physician. CALL BACK IF: * Any facial rash or swelling * Pain last over 24 hours * Pain is severe and not relieved with pain medications * You become worse CARE ADVICE given per Face Pain (Adult) guideline.

## 2020-03-13 ENCOUNTER — Other Ambulatory Visit: Payer: Self-pay

## 2020-03-17 ENCOUNTER — Ambulatory Visit: Payer: Medicare Other | Admitting: Internal Medicine

## 2020-03-17 ENCOUNTER — Other Ambulatory Visit: Payer: Self-pay

## 2020-03-17 ENCOUNTER — Encounter: Payer: Self-pay | Admitting: Internal Medicine

## 2020-03-17 VITALS — BP 127/80 | HR 75 | Ht 64.5 in | Wt 253.0 lb

## 2020-03-17 DIAGNOSIS — E782 Mixed hyperlipidemia: Secondary | ICD-10-CM

## 2020-03-17 DIAGNOSIS — E669 Obesity, unspecified: Secondary | ICD-10-CM | POA: Diagnosis not present

## 2020-03-17 DIAGNOSIS — E11319 Type 2 diabetes mellitus with unspecified diabetic retinopathy without macular edema: Secondary | ICD-10-CM

## 2020-03-17 DIAGNOSIS — E1165 Type 2 diabetes mellitus with hyperglycemia: Secondary | ICD-10-CM

## 2020-03-17 DIAGNOSIS — IMO0002 Reserved for concepts with insufficient information to code with codable children: Secondary | ICD-10-CM

## 2020-03-17 LAB — POCT GLYCOSYLATED HEMOGLOBIN (HGB A1C): Hemoglobin A1C: 7.7 % — AB (ref 4.0–5.6)

## 2020-03-17 MED ORDER — GLIMEPIRIDE 4 MG PO TABS: ORAL_TABLET | ORAL | 3 refills | Status: DC

## 2020-03-17 NOTE — Progress Notes (Signed)
Patient ID: Krista Benjamin, female   DOB: [birth year], 69 y.o.   MRN: [id number]  This visit occurred during the SARS-CoV-2 public health emergency.  Safety protocols were in place, including screening questions prior to the visit, additional usage of staff PPE, and extensive cleaning of exam room while observing appropriate contact time as indicated for disinfecting solutions.   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  Last visit 4 months ago. She has [employer] + [employer].   She started to go to the gym  - riding the bicycle - will continue to go 3x a week.  She has a new [place], 29 week old, currently in the ICU as he was 3 weeks premature.  Reviewed HbA1c levels: Lab Results  Component Value Date   HGBA1C 6.5 (A) 11/12/2019   HGBA1C 6.4 (A) 07/08/2019   HGBA1C 7.3 (H) 04/25/2019  Prev. 5.9% on 12/2012, previously 6.1%.  Pt is on a regimen of: - Metformin ER 1000 mg 2x a day with meals  - Amaryl 4 mg before breakfast  (stopped 11/2019) - Tradjenta 5 >> will change to Januvia 100 mg before breakfast - per insurance pref. Other medications tried: Rec'd GLP1 agonist >> Victoza covered >> she was afraid of SEs. We tried Jardiance 10 mg daily in am - added 04/2017 >> yeast inf >> stopped We tried Invokana 100 mg in am >> stopped after 3 days >> nausea, diarrhea We stopped Lantus 10 units qhs. We stopped Actos 45 mg (was on it since 07/2001) Metformin IR 1000 mg bid >> stopped b/c Nausea/Diarrhea She was on Novolog 8 units tid ac, when sugars >140   Pt checks her sugars 1-2 times a day: - am: 63-97, 106 >> 66, 70-123, 145, 183 >> 62, 67, 80-138, 158, 180, 233 - 2h after b'fast:  135, 139, 193 >> 189 >> 115 >> n/c - lunch: 135-169 >> 183 >> 71, 82 >> n/c - 2h after lunch: 303 >> n/c >> 201 >> 104 >> n/c - dinner: 95-148, 201 >> 171, 178, 195  (steroids) >> n/c - 2h after dinner: 197, 210, 231  >> 115-173 >> n/c - bedtime:see above  >> 66, 97-197, 215 >> n/c - night:   76, 137 >> n/c >> 184 >> 125, 128 >> n/c Lowest sugar:  58 in 07/2019 >> 62; she has hypoglycemia awareness in the 70s. Highest sugar: 300 (at the time of her fx) >> .Marland KitchenMarland Kitchen181 >> 233.  Glucometer: One Touch Verio Flex  Pt's meals are:  - Breakfast: smoothies >> belvita 1 package - Lunch: meat + veggies - mostly skips - Dinner: meat + veggies - Snacks: 1-2, fruit, yoghurt  She lost 80 pounds since 2012: >> Was able to come off insulin. She saw Oran Rein (nutritionist) on 01/01/2015.  -+ Mild CKD. Latest BUN/creatinine was:  Lab Results  Component Value Date   BUN 21 04/25/2019   Lab Results  Component Value Date   CREATININE 0.97 04/25/2019  On Entresto << quinapril.  -+ HL.  Latest lipid profile: Lab Results  Component Value Date   CHOL 153 04/25/2019   HDL 42.30 04/25/2019   LDLCALC 86 04/25/2019   TRIG 121.0 04/25/2019   CHOLHDL 4 04/25/2019  On Lipitor. - last eye exam: 2019: Background DR + h/o B cataract sx. - she denies numbness and tingling in her legs.  She also has a history  of HTN, iron deficiency anemia-on iron therapy, aortic valve disease/murmur, left ventricular hypertrophy per EKG, obesity, multinodular goiter, fibroids.  She had shingles in 11/2014 and 02/2016.  She fell and broke R wrist 06/2018. She had another, right radial, fracture 01/2019.  ROS: Constitutional: no weight gain/+ weight loss, no fatigue, no subjective hyperthermia, no subjective hypothermia Eyes: no blurry vision, no xerophthalmia ENT: no sore throat, no nodules palpated in neck, no dysphagia, no odynophagia, no hoarseness Cardiovascular: no CP/no SOB/no palpitations/no leg swelling Respiratory: no cough/no SOB/no wheezing Gastrointestinal: no N/no V/no D/no C/no acid reflux Musculoskeletal: no muscle aches/no joint aches Skin: no rashes, no hair  loss Neurological: no tremors/no numbness/no tingling/no dizziness  I reviewed pt's medications, allergies, PMH, social hx, family hx, and changes were documented in the history of present illness. Otherwise, unchanged from my initial visit note.  Past Medical History:  Diagnosis Date  . Anemia   . Aortic stenosis    Mild  . Arthritis   . Cataract    both eyes  (cataracts removed)  . Diabetes mellitus    sees Dr. Elvera Lennox   . Gynecological examination    sees Dr. Jennette Kettle   . Hyperlipidemia   . Hypertension    sees Dr. Angelina Sheriff  . Stroke [facility])    TIA   Past Surgical History:  Procedure Laterality Date  . CARPAL TUNNEL RELEASE  01/22/14   Left Hand   . CATARACT EXTRACTION     Lens surgery  . COLONOSCOPY  02/06/2018   per Dr. Myrtie Neither, no polyps, repeat in 5 yrs (colon CA in mother)   . excision of benign cyst     from left maxilla and left maxillary sinus  . KNEE SURGERY    . RIGHT/LEFT HEART CATH AND CORONARY ANGIOGRAPHY N/A 09/14/2018   Procedure: RIGHT/LEFT HEART CATH AND CORONARY ANGIOGRAPHY;  Surgeon: [place], Peter M, MD;  Location: [facility] INVASIVE CV LAB;  Service: Cardiovascular;  Laterality: N/A;  . SHOULDER ARTHROSCOPY W/ ROTATOR CUFF REPAIR Left 09-23-13   per Dr. Delrae Sawyers at [facility] center in [place]    Social History   Socioeconomic History  . Marital status: Married    Spouse name: Not on file  . Number of children: Not on file  . Years of education: Not on file  . Highest education level: Not on file  Occupational History  . Not on file  Tobacco Use  . Smoking status: Never Smoker  . Smokeless tobacco: Never Used  Substance and Sexual Activity  . Alcohol use: No    Alcohol/week: 0.0 standard drinks  . Drug use: No  . Sexual activity: Not on file  Other Topics Concern  . Not on file  Social History Narrative  . Not on file   Social Determinants of Health   Financial Resource Strain:   . Difficulty of Paying Living Expenses:   Food  Insecurity:   . Worried About Programme researcher, broadcasting/film/video in the Last Year:   . Barista in the Last Year:   Transportation Needs:   . Freight forwarder (Medical):   Marland Kitchen Lack of Transportation (Non-Medical):   Physical Activity:   . Days of Exercise per Week:   . Minutes of Exercise per Session:   Stress:   . Feeling of Stress :   Social Connections:   . Frequency of Communication with Friends and Family:   . Frequency of Social Gatherings with Friends and Family:   . Attends Religious  Services:   . Active Member of Clubs or Organizations:   . Attends Banker Meetings:   Marland Kitchen Marital Status:   Intimate Partner Violence:   . Fear of Current or Ex-Partner:   . Emotionally Abused:   Marland Kitchen Physically Abused:   . Sexually Abused:    Current Outpatient Medications on File Prior to Visit  Medication Sig Dispense Refill  . albuterol (PROVENTIL) (2.5 MG/3ML) 0.083% nebulizer solution Take 3 mLs (2.5 mg total) by nebulization every 4 (four) hours as needed for wheezing or shortness of breath. 120 mL 3  . albuterol (VENTOLIN HFA) 108 (90 Base) MCG/ACT inhaler USE 2 INHALATIONS EVERY 4 HOURS AS NEEDED FOR WHEEZING 54 g 3  . Alcohol Swabs PADS Use to check blood sugar once a day.  DX E11.319 200 each 3  . aspirin 81 MG tablet Take 81 mg by mouth at bedtime.     Marland Kitchen atorvastatin (LIPITOR) 40 MG tablet TAKE 1 TABLET AT BEDTIME 90 tablet 0  . bumetanide (BUMEX) 1 MG tablet TAKE 2 TABLETS AT BEDTIME 180 tablet 3  . cholecalciferol (VITAMIN D) 1000 UNITS tablet Take 2,000 Units by mouth at bedtime.     . cyclobenzaprine (FLEXERIL) 10 MG tablet Take 1 tablet (10 mg total) by mouth 3 (three) times daily as needed for muscle spasms. 60 tablet 2  . EPIPEN 2-PAK 0.3 MG/0.3ML SOAJ injection INJECT 0.3 MLS (0.3 MG TOTAL) INTO THE MUSCLE ONCE. 1 Device 0  . glimepiride (AMARYL) 4 MG tablet Take 4 mg in am and 2 mg in pm 120 tablet 3  . hydrALAZINE (APRESOLINE) 50 MG tablet Take 1 tablet (50 mg total)  by mouth 3 (three) times daily. 270 tablet 3  . Insulin Pen Needle (CAREFINE PEN NEEDLES) 32G X 4 MM MISC Use with victoza 100 each 1  . isosorbide mononitrate (IMDUR) 120 MG 24 hr tablet Take 1 tablet (120 mg total) by mouth daily. 90 tablet 3  . ketoconazole (NIZORAL) 2 % cream Apply 1 application topically daily. 30 g 5  . metFORMIN (GLUCOPHAGE-XR) 500 MG 24 hr tablet TAKE 2 TABLETS TWICE A DAY WITH MEALS 360 tablet 3  . methocarbamol (ROBAXIN) 500 MG tablet Take 1 tablet (500 mg total) by mouth 2 (two) times daily as needed for muscle spasms. 20 tablet 0  . metoprolol succinate (TOPROL-XL) 100 MG 24 hr tablet TAKE 1 TABLET (100 MG TOTAL) BY MOUTH DAILY. TAKE WITH OR IMMEDIATELY FOLLOWING A MEAL. 90 tablet 3  . mupirocin ointment (BACTROBAN) 2 % Place 1 application into the nose 2 (two) times daily. 22 g 1  . NON FORMULARY     . ONETOUCH DELICA LANCETS 33G MISC Use to check blood sugar twice a day.  DX  E11.319 200 each 1  . sacubitril-valsartan (ENTRESTO) 49-51 MG Take 1 tablet by mouth 2 (two) times daily. 60 tablet 0  . sitaGLIPtin (JANUVIA) 100 MG tablet Take 1 tablet (100 mg total) by mouth daily. 90 tablet 2  . TRUE METRIX BLOOD GLUCOSE TEST test strip USE AS DIRECTED TWICE A DAY 200 strip 12   Current Facility-Administered Medications on File Prior to Visit  Medication Dose Route Frequency Provider Last Rate Last Admin  . 0.9 %  sodium chloride infusion  500 mL Intravenous Once Sherrilyn Rist, MD       No Known Allergies Family History  Problem Relation Age of Onset  . Coronary artery disease Brother   . Coronary artery disease Mother   .  Colon cancer Mother   . Cancer Other        fhx  . Diabetes Other        fhx  . Hyperlipidemia Other        fhx  . Hypertension Other        fhx  . Stroke Other        fhx  . Colon polyps Neg Hx   . Esophageal cancer Neg Hx   . Rectal cancer Neg Hx   . Stomach cancer Neg Hx     PE: BP 127/80   Pulse 75   Ht 5' 4.5" (1.638 m)    Wt 253 lb (114.8 kg)   SpO2 96%   BMI 42.76 kg/m  Body mass index is 42.76 kg/m.  Wt Readings from Last 3 Encounters:  03/17/20 253 lb (114.8 kg)  02/27/20 256 lb 12.8 oz (116.5 kg)  11/12/19 263 lb (119.3 kg)   Constitutional: overweight, in NAD Eyes: PERRLA, EOMI, no exophthalmos ENT: moist mucous membranes, no thyromegaly, no cervical lymphadenopathy Cardiovascular: RRR, No MRG Respiratory: CTA B Gastrointestinal: abdomen soft, NT, ND, BS+ Musculoskeletal: no deformities, strength intact in all 4 Skin: moist, warm, no rashes Neurological: no tremor with outstretched hands, DTR normal in all 4  ASSESSMENT: 1. DM2, non-insulin-dependent, uncontrolled, with complications - CKD stage 2 - background DR  2.  Obesity class III  3. HL  PLAN:  1.  Patient with longstanding, uncontrolled, type 2 diabetes, on Metformin, DPP 4 inhibitor, and sulfonylurea.  She could not tolerate SGLT2 inhibitors due to yeast infections.  Her insurance did not cover weekly formulations of GLP-1 receptor agonist, only Victoza.  However, she was afraid to start this due to possible side effects.  At last visit, sugars were still at goal in the morning with only occasional hyperglycemic spikes, but she also had a few slight lows, in the 60s.  Therefore, we stopped her Amaryl dose before dinner.  Since last visit, we also had to change her DPP 4 inhibitor from Tradjenta to [place].  However, this visit, she tells me that she did not start [place] yet, as she had plenty of Tradjenta. -At this visit, sugars appear to be excellent and even on the low side (2 CBGs in the 60s) 01/2020 per review of her logs, but they were slightly higher in 02/2020 and this month.  She started to work on her diet and also restarted going to the gym yesterday.  He plans to continue to go there 3 times a week to use a bicycle.  She also retired and she is able to check sugars more now.  As of now, she is still checking sugars in the  morning and I advised her to also rotate checks later in the day. -Upon questioning, at last visit she misunderstood instructions and she is actually taking 8 mg of Amaryl before breakfast.  Since she does not check sugars later in the day, I am not sure if this is too much for her.  I advised her to start checking sugars before lunch and if they are too low (especially in the setting of exercise), to back off the dose to only 4 mg. - I advised her to:  Patient Instructions  Please continue: - Metformin ER 1000 mg 2x a day with meals  - Tradjenta 5 mg/Januvia 100 mg before breakfast  Please change: - Amaryl 4-8 mg before breakfast  Please return in 4 months with your sugar log.   -  we checked her HbA1c: 7.7% (higher)  - advised to check sugars at different times of the day - 1x a day, rotating check times - advised for yearly eye exams >> she is not UTD - return to clinic in 4 months     2.  Obesity class III -She lost ~30 pounds in the previous year by increasing walking but then gained weight during the coronavirus pandemic -She gained almost 9 pounds before last visit and lost 10 lbs since last OV!  3. HL -Reviewed latest lipid panel from 04/2019: Fractions are at goal except slightly higher LDL than goal (less than 70) Lab Results  Component Value Date   CHOL 153 04/25/2019   HDL 42.30 04/25/2019   LDLCALC 86 04/25/2019   TRIG 121.0 04/25/2019   CHOLHDL 4 04/25/2019  -Continues Lipitor 40 without side effects  Carlus Pavlov, MD PhD [facility] Endocrinology

## 2020-03-17 NOTE — Addendum Note (Signed)
Addended by: Darliss Ridgel I on: 03/17/2020 09:47 AM   Modules accepted: Orders

## 2020-03-17 NOTE — Patient Instructions (Signed)
Please continue: - Metformin ER 1000 mg 2x a day with meals  - Tradjenta 5 mg/Januvia 100 mg before breakfast  Please change: - Amaryl 4-8 mg before breakfast  Please return in 4 months with your sugar log.

## 2020-03-20 ENCOUNTER — Other Ambulatory Visit: Payer: Self-pay | Admitting: Cardiology

## 2020-03-26 LAB — HM DIABETES EYE EXAM

## 2020-03-30 ENCOUNTER — Encounter: Payer: Self-pay | Admitting: Family Medicine

## 2020-03-31 ENCOUNTER — Other Ambulatory Visit: Payer: Self-pay

## 2020-03-31 ENCOUNTER — Ambulatory Visit (INDEPENDENT_AMBULATORY_CARE_PROVIDER_SITE_OTHER): Payer: Medicare Other | Admitting: Physician Assistant

## 2020-03-31 ENCOUNTER — Encounter: Payer: Self-pay | Admitting: Physician Assistant

## 2020-03-31 VITALS — BP 143/80 | HR 75 | Ht 64.5 in | Wt 259.0 lb

## 2020-03-31 DIAGNOSIS — I447 Left bundle-branch block, unspecified: Secondary | ICD-10-CM

## 2020-03-31 DIAGNOSIS — I428 Other cardiomyopathies: Secondary | ICD-10-CM | POA: Diagnosis not present

## 2020-03-31 DIAGNOSIS — E119 Type 2 diabetes mellitus without complications: Secondary | ICD-10-CM

## 2020-03-31 DIAGNOSIS — I10 Essential (primary) hypertension: Secondary | ICD-10-CM

## 2020-03-31 DIAGNOSIS — Z79899 Other long term (current) drug therapy: Secondary | ICD-10-CM | POA: Diagnosis not present

## 2020-03-31 DIAGNOSIS — E785 Hyperlipidemia, unspecified: Secondary | ICD-10-CM

## 2020-03-31 LAB — BASIC METABOLIC PANEL
BUN/Creatinine Ratio: 21 (ref 12–28)
BUN: 19 mg/dL (ref 8–27)
CO2: 26 mmol/L (ref 20–29)
Calcium: 9.4 mg/dL (ref 8.7–10.3)
Chloride: 102 mmol/L (ref 96–106)
Creatinine, Ser: 0.9 mg/dL (ref 0.57–1.00)
GFR calc Af Amer: 75 mL/min/{1.73_m2} (ref >59)
GFR calc non Af Amer: 65 mL/min/{1.73_m2} (ref >59)
Glucose: 92 mg/dL (ref 65–99)
Potassium: 4.7 mmol/L (ref 3.5–5.2)
Sodium: 141 mmol/L (ref 134–144)

## 2020-03-31 MED ORDER — ENTRESTO 97-103 MG PO TABS: 1.0000 | ORAL_TABLET | Freq: Two times a day (BID) | ORAL | 3 refills | Status: DC

## 2020-03-31 NOTE — Progress Notes (Signed)
Cardiology Office Note:    Date:  04/02/2020   ID:  Janera Peugh, DOB [birth year], MRN [id number]  PCP:  Nelwyn Salisbury, MD  [facility] Cardiologist:  Rollene Rotunda, MD  [facility] Electrophysiologist:  None   Referring MD: Nelwyn Salisbury, MD   Chief Complaint  Patient presents with  . Follow-up    seen for Dr. Antoine Poche    History of Present Illness:    Krista Benjamin is a 69 y.o. female with a hx of NICM, LBBB, hypertension, hyperlipidemia, DM2 and history of TIA.  Patient has a history of nonischemic cardiomyopathy with EF 40 to 45% on echocardiogram on 08/31/2018.  Subsequent cardiac catheterization performed on 09/14/2018 showed minimal nonobstructive CAD, EF 35-45% by LV gram, normal LV filling pressure in the normal right heart pressure.  It was recommended to optimize antihypertensive therapy.  Repeat echocardiogram performed on 03/02/2019 showed EF 35 to 40%, moderate hypokinesis of the mid apical anteroseptal wall, dyssynergy of the ventricular septal wall consistent with left bundle branch block.  Carotid Doppler obtained in November 2020 showed mild disease bilaterally.  In order to help her LV dysfunction, she was switched to Entresto 49-51 mg twice daily during the last office visit.  Otherwise she is also on appropriate heart failure medication including metoprolol succinate, hydralazine and Imdur.  Patient presents today for cardiology office visit.  She did mention she felt funny the first few days after she started on the [facility], however has been able to tolerate it since.  She did develop a diffuse rash, this is getting better as well.  On examination, I do not see significant amount of rash, she does have a pinpoint lesion in her right upper chest.  Otherwise she appears to be euvolemic on exam.  I recommended increasing Entresto to 97-103 mg twice a day.  We will fill out a new medication assistance form for her as she lost her previous form.  Given the  [facility], she will need a basic metabolic panel today and also in 3 weeks.  She will need a 4-month repeat echocardiogram before follow-up with Dr. Antoine Poche.   Past Medical History:  Diagnosis Date  . Anemia   . Aortic stenosis    Mild  . Arthritis   . Cataract    both eyes  (cataracts removed)  . Diabetes mellitus    sees Dr. Elvera Lennox   . Gynecological examination    sees Dr. Jennette Kettle   . Hyperlipidemia   . Hypertension    sees Dr. Angelina Sheriff  . Stroke [facility])    TIA    Past Surgical History:  Procedure Laterality Date  . CARPAL TUNNEL RELEASE  01/22/14   Left Hand   . CATARACT EXTRACTION     Lens surgery  . COLONOSCOPY  02/06/2018   per Dr. Myrtie Neither, no polyps, repeat in 5 yrs (colon CA in mother)   . excision of benign cyst     from left maxilla and left maxillary sinus  . KNEE SURGERY    . RIGHT/LEFT HEART CATH AND CORONARY ANGIOGRAPHY N/A 09/14/2018   Procedure: RIGHT/LEFT HEART CATH AND CORONARY ANGIOGRAPHY;  Surgeon: Sw[place]il[place], Peter M, MD;  Location: [facility] INVASIVE CV LAB;  Service: Cardiovascular;  Laterality: N/A;  . SHOULDER ARTHROSCOPY W/ ROTATOR CUFF REPAIR Left 09-23-13   per Dr. Delrae Sawyers at [facility] center in [place]     Current Medications: Current Meds  Medication Sig  . albuterol (PROVENTIL) (2.5 MG/3ML) 0.083% nebulizer solution Take 3  mLs (2.5 mg total) by nebulization every 4 (four) hours as needed for wheezing or shortness of breath.  Marland Kitchen albuterol (VENTOLIN HFA) 108 (90 Base) MCG/ACT inhaler USE 2 INHALATIONS EVERY 4 HOURS AS NEEDED FOR WHEEZING  . Alcohol Swabs PADS Use to check blood sugar once a day.  DX E11.319  . aspirin 81 MG tablet Take 81 mg by mouth at bedtime.   Marland Kitchen atorvastatin (LIPITOR) 40 MG tablet TAKE 1 TABLET AT BEDTIME  . bumetanide (BUMEX) 1 MG tablet TAKE 2 TABLETS AT BEDTIME  . cholecalciferol (VITAMIN D) 1000 UNITS tablet Take 2,000 Units by mouth at bedtime.   Marland Kitchen EPIPEN 2-PAK 0.3 MG/0.3ML SOAJ injection INJECT 0.3 MLS (0.3 MG  TOTAL) INTO THE MUSCLE ONCE.  Marland Kitchen glimepiride (AMARYL) 4 MG tablet Take 4-8 mg in am before b'fast  . hydrALAZINE (APRESOLINE) 50 MG tablet Take 1 tablet (50 mg total) by mouth 3 (three) times daily.  . Insulin Pen Needle (CAREFINE PEN NEEDLES) 32G X 4 MM MISC Use with victoza  . isosorbide mononitrate (IMDUR) 120 MG 24 hr tablet Take 1 tablet (120 mg total) by mouth daily.  Marland Kitchen ketoconazole (NIZORAL) 2 % cream Apply 1 application topically daily.  Marland Kitchen linagliptin (TRADJENTA) 5 MG TABS tablet Take 5 mg by mouth daily.  . metFORMIN (GLUCOPHAGE-XR) 500 MG 24 hr tablet TAKE 2 TABLETS TWICE A DAY WITH MEALS  . metoprolol succinate (TOPROL-XL) 100 MG 24 hr tablet TAKE 1 TABLET (100 MG TOTAL) BY MOUTH DAILY. TAKE WITH OR IMMEDIATELY FOLLOWING A MEAL.  . mupirocin ointment (BACTROBAN) 2 % Place 1 application into the nose 2 (two) times daily.  . NON FORMULARY   . ONETOUCH DELICA LANCETS 85O MISC Use to check blood sugar twice a day.  DX  E11.319  . TRUE METRIX BLOOD GLUCOSE TEST test strip USE AS DIRECTED TWICE A DAY  . [DISCONTINUED] ENTRESTO 49-51 MG TAKE 1 TABLET BY MOUTH 2 (TWO) TIMES DAILY.   Current Facility-Administered Medications for the 03/31/20 encounter (Office Visit) with Almyra Deforest, PA  Medication  . 0.9 %  sodium chloride infusion     Allergies:   Patient has no known allergies.   Social History   Socioeconomic History  . Marital status: Married    Spouse name: Not on file  . Number of children: Not on file  . Years of education: Not on file  . Highest education level: Not on file  Occupational History  . Not on file  Tobacco Use  . Smoking status: Never Smoker  . Smokeless tobacco: Never Used  Vaping Use  . Vaping Use: Never used  Substance and Sexual Activity  . Alcohol use: No    Alcohol/week: 0.0 standard drinks  . Drug use: No  . Sexual activity: Not on file  Other Topics Concern  . Not on file  Social History Narrative  . Not on file   Social Determinants of  Health   Financial Resource Strain:   . Difficulty of Paying Living Expenses:   Food Insecurity:   . Worried About Charity fundraiser in the Last Year:   . Arboriculturist in the Last Year:   Transportation Needs:   . Film/video editor (Medical):   Marland Kitchen Lack of Transportation (Non-Medical):   Physical Activity:   . Days of Exercise per Week:   . Minutes of Exercise per Session:   Stress:   . Feeling of Stress :   Social Connections:   . Frequency  of Communication with Friends and Family:   . Frequency of Social Gatherings with Friends and Family:   . Attends Religious Services:   . Active Member of Clubs or Organizations:   . Attends Banker Meetings:   Marland Kitchen Marital Status:      Family History: The patient's family history includes Cancer in an other family member; Colon cancer in her mother; Coronary artery disease in her brother and mother; Diabetes in an other family member; Hyperlipidemia in an other family member; Hypertension in an other family member; Stroke in an other family member. There is no history of Colon polyps, Esophageal cancer, Rectal cancer, or Stomach cancer.  ROS:   Please see the history of present illness.     All other systems reviewed and are negative.  EKGs/Labs/Other Studies Reviewed:    The following studies were reviewed today:  Echo 03/01/2019 1. Moderate hypokinesis of the left ventricular, mid-apical anteroseptal  wall.  2. The left ventricle has moderately reduced systolic function, with an  ejection fraction of 35-40%. The cavity size was normal. There is mildly  increased left ventricular wall thickness. Left ventricular diastolic  Doppler parameters are consistent with  impaired relaxation. There is abnormal septal motion consistent with left  bundle branch block. Left ventricular diffuse hypokinesis.  3. The right ventricle has normal systolic function. The cavity was  normal. There is no increase in right ventricular  wall thickness.  4. Left atrial size was mild-moderately dilated.  5. There is moderate mitral annular calcification present.  6. The aortic valve is tricuspid. Mild thickening of the aortic valve.  Mild calcification of the aortic valve. Aortic valve regurgitation was not  assessed by color flow Doppler.   EKG:  EKG is not ordered today.   Recent Labs: 04/25/2019: ALT 9; Hemoglobin 11.5; Platelets 251.0; TSH 1.14 03/31/2020: BUN 19; Creatinine, Ser 0.90; Potassium 4.7; Sodium 141  Recent Lipid Panel    Component Value Date/Time   CHOL 153 04/25/2019 0932   TRIG 121.0 04/25/2019 0932   HDL 42.30 04/25/2019 0932   CHOLHDL 4 04/25/2019 0932   VLDL 24.2 04/25/2019 0932   LDLCALC 86 04/25/2019 0932    Physical Exam:    VS:  BP (!) 143/80   Pulse 75   Ht 5' 4.5" (1.638 m)   Wt 259 lb (117.5 kg)   BMI 43.77 kg/m     Wt Readings from Last 3 Encounters:  03/31/20 259 lb (117.5 kg)  03/17/20 253 lb (114.8 kg)  02/27/20 256 lb 12.8 oz (116.5 kg)     GEN:  Well nourished, well developed in no acute distress HEENT: Normal NECK: No JVD; No carotid bruits LYMPHATICS: No lymphadenopathy CARDIAC: RRR, no murmurs, rubs, gallops RESPIRATORY:  Clear to auscultation without rales, wheezing or rhonchi  ABDOMEN: Soft, non-tender, non-distended MUSCULOSKELETAL:  No edema; No deformity  SKIN: Warm and dry NEUROLOGIC:  Alert and oriented x 3 PSYCHIATRIC:  Normal affect   ASSESSMENT:    1. Nonischemic cardiomyopathy (HCC)   2. Medication management   3. LBBB (left bundle branch block)   4. Essential hypertension   5. Hyperlipidemia LDL goal <70   6. Controlled type 2 diabetes mellitus without complication, without long-term current use of insulin (HCC)    PLAN:    In order of problems listed above:  1. Nonischemic cardiomyopathy: Last echocardiogram obtained in May 2020 showed EF 35 to 40%.  She was recently switched to [facility]  I will further uptitrate Entresto to  97-140 mg  twice daily.  She will need a basic metabolic panel today and also in 1 week  2. LBBB: Chronic, asymptomatic  3. Hypertension: Blood pressure stable, increase Entresto as mentioned above  4. Hyperlipidemia: Continue Lipitor  5. DM2: Managed by primary care provider.   Medication Adjustments/Labs and Tests Ordered: Current medicines are reviewed at length with the patient today.  Concerns regarding medicines are outlined above.  Orders Placed This Encounter  Procedures  . Basic metabolic panel  . Basic metabolic panel  . ECHOCARDIOGRAM COMPLETE   Meds ordered this encounter  Medications  . sacubitril-valsartan (ENTRESTO) 97-103 MG    Sig: Take 1 tablet by mouth 2 (two) times daily.    Dispense:  180 tablet    Refill:  3    Patient Instructions  Medication Instructions:   INCREASE Entresto to 97-103 mg 2 times a day *If you need a refill on your cardiac medications before your next appointment, please call your pharmacy*  Lab Work: Your physician recommends that you return for lab work TODAY:  BMET  Your physician recommends that you return for lab work in 3 weeks 04/21/2020:   BMET If you have labs (blood work) drawn today and your tests are completely normal, you will receive your results only by: Marland Kitchen MyChart Message (if you have MyChart) OR . A paper copy in the mail If you have any lab test that is abnormal or we need to change your treatment, we will call you to review the results.  Testing/Procedures: Your physician has requested that you have an echocardiogram. Echocardiography is a painless test that uses sound waves to create images of your heart. It provides your doctor with information about the size and shape of your heart and how well your heart's chambers and valves are working. This procedure takes approximately one hour. There are no restrictions for this procedure.   PLEASE SCHEDULE FOR 3 MONTHS  Follow-Up: At [facility], you and your health needs  are our priority.  As part of our continuing mission to provide you with exceptional heart care, we have created designated Provider Care Teams.  These Care Teams include your primary Cardiologist (physician) and Advanced Practice Providers (APPs -  Physician Assistants and Nurse Practitioners) who all work together to provide you with the care you need, when you need it.  Your next appointment:   3 month(s) after Echocardiogram  The format for your next appointment:   In Person  Provider:   Rollene Rotunda, MD  Other Instructions      Signed, Azalee Course, PA  04/02/2020 11:04 PM    [facility]

## 2020-03-31 NOTE — Patient Instructions (Addendum)
Medication Instructions:   INCREASE Entresto to 97-103 mg 2 times a day *If you need a refill on your cardiac medications before your next appointment, please call your pharmacy*  Lab Work: Your physician recommends that you return for lab work TODAY:  BMET  Your physician recommends that you return for lab work in 3 weeks 04/21/2020:   BMET If you have labs (blood work) drawn today and your tests are completely normal, you will receive your results only by: Marland Kitchen MyChart Message (if you have MyChart) OR . A paper copy in the mail If you have any lab test that is abnormal or we need to change your treatment, we will call you to review the results.  Testing/Procedures: Your physician has requested that you have an echocardiogram. Echocardiography is a painless test that uses sound waves to create images of your heart. It provides your doctor with information about the size and shape of your heart and how well your heart's chambers and valves are working. This procedure takes approximately one hour. There are no restrictions for this procedure.   PLEASE SCHEDULE FOR 3 MONTHS  Follow-Up: At [facility], you and your health needs are our priority.  As part of our continuing mission to provide you with exceptional heart care, we have created designated Provider Care Teams.  These Care Teams include your primary Cardiologist (physician) and Advanced Practice Providers (APPs -  Physician Assistants and Nurse Practitioners) who all work together to provide you with the care you need, when you need it.  Your next appointment:   3 month(s) after Echocardiogram  The format for your next appointment:   In Person  Provider:   Rollene Rotunda, MD  Other Instructions

## 2020-04-01 NOTE — Telephone Encounter (Signed)
Follow up  Patient is calling back in about the paperwork for medication. Patient states that the box needed to be checked for Medicare D and faxed back in. Please give patient a call back to assist.

## 2020-04-02 ENCOUNTER — Encounter: Payer: Self-pay | Admitting: Physician Assistant

## 2020-04-27 ENCOUNTER — Encounter: Payer: Medicare Other | Admitting: Family Medicine

## 2020-05-01 ENCOUNTER — Other Ambulatory Visit: Payer: Self-pay | Admitting: Internal Medicine

## 2020-05-06 ENCOUNTER — Other Ambulatory Visit: Payer: Self-pay | Admitting: Family Medicine

## 2020-05-11 ENCOUNTER — Other Ambulatory Visit: Payer: Self-pay | Admitting: Cardiology

## 2020-05-11 ENCOUNTER — Telehealth: Payer: Self-pay | Admitting: Cardiology

## 2020-05-11 ENCOUNTER — Other Ambulatory Visit: Payer: Self-pay

## 2020-05-11 ENCOUNTER — Ambulatory Visit (INDEPENDENT_AMBULATORY_CARE_PROVIDER_SITE_OTHER): Payer: Medicare Other | Admitting: Family Medicine

## 2020-05-11 ENCOUNTER — Encounter: Payer: Self-pay | Admitting: Family Medicine

## 2020-05-11 VITALS — BP 124/62 | HR 72 | Temp 98.3°F | Ht 64.5 in | Wt 255.0 lb

## 2020-05-11 DIAGNOSIS — E669 Obesity, unspecified: Secondary | ICD-10-CM

## 2020-05-11 DIAGNOSIS — I10 Essential (primary) hypertension: Secondary | ICD-10-CM | POA: Diagnosis not present

## 2020-05-11 DIAGNOSIS — E1122 Type 2 diabetes mellitus with diabetic chronic kidney disease: Secondary | ICD-10-CM | POA: Diagnosis not present

## 2020-05-11 DIAGNOSIS — D508 Other iron deficiency anemias: Secondary | ICD-10-CM

## 2020-05-11 DIAGNOSIS — E782 Mixed hyperlipidemia: Secondary | ICD-10-CM | POA: Diagnosis not present

## 2020-05-11 DIAGNOSIS — N182 Chronic kidney disease, stage 2 (mild): Secondary | ICD-10-CM

## 2020-05-11 DIAGNOSIS — I420 Dilated cardiomyopathy: Secondary | ICD-10-CM

## 2020-05-11 MED ORDER — ISOSORBIDE MONONITRATE ER 120 MG PO TB24: 120.0000 mg | ORAL_TABLET | Freq: Every day | ORAL | 3 refills | Status: DC

## 2020-05-11 MED ORDER — ISOSORBIDE MONONITRATE ER 120 MG PO TB24: 120.0000 mg | ORAL_TABLET | Freq: Every day | ORAL | 0 refills | Status: DC

## 2020-05-11 NOTE — Addendum Note (Signed)
Addended by: Virl Axe, Harol Shabazz L on: 05/11/2020 11:53 AM   Modules accepted: Orders

## 2020-05-11 NOTE — Progress Notes (Signed)
   Subjective:    Patient ID: Krista Benjamin, female    DOB: 08-Oct-1951, 69 y.o.   MRN: [id number]  HPI Here to follow up on issues. She feels well except for her joint pains. Her BP is stable. She sees Dr. Elvera Lennox for the diabetes. She sees Dr. Antoine Poche regularly for the cardiomyopathy.    Review of Systems  Constitutional: Negative.   HENT: Negative.   Eyes: Negative.   Respiratory: Negative.   Cardiovascular: Negative.   Gastrointestinal: Negative.   Genitourinary: Negative for decreased urine volume, difficulty urinating, dyspareunia, dysuria, enuresis, flank pain, frequency, hematuria, pelvic pain and urgency.  Musculoskeletal: Positive for arthralgias and back pain.  Skin: Negative.   Neurological: Negative.   Psychiatric/Behavioral: Negative.        Objective:   Physical Exam Constitutional:      General: She is not in acute distress.    Appearance: She is well-developed.  HENT:     Head: Normocephalic and atraumatic.     Right Ear: External ear normal.     Left Ear: External ear normal.     Nose: Nose normal.     Mouth/Throat:     Pharynx: No oropharyngeal exudate.  Eyes:     General: No scleral icterus.    Conjunctiva/sclera: Conjunctivae normal.     Pupils: Pupils are equal, round, and reactive to light.  Neck:     Thyroid: No thyromegaly.     Vascular: No JVD.  Cardiovascular:     Rate and Rhythm: Normal rate and regular rhythm.     Heart sounds: Normal heart sounds. No murmur heard.  No friction rub. No gallop.   Pulmonary:     Effort: Pulmonary effort is normal. No respiratory distress.     Breath sounds: Normal breath sounds. No wheezing or rales.  Chest:     Chest wall: No tenderness.  Abdominal:     General: Bowel sounds are normal. There is no distension.     Palpations: Abdomen is soft. There is no mass.     Tenderness: There is no abdominal tenderness. There is no guarding or rebound.  Musculoskeletal:        General: No tenderness.  Normal range of motion.     Cervical back: Normal range of motion and neck supple.  Lymphadenopathy:     Cervical: No cervical adenopathy.  Skin:    General: Skin is warm and dry.     Findings: No erythema or rash.  Neurological:     Mental Status: She is alert and oriented to person, place, and time.     Cranial Nerves: No cranial nerve deficit.     Motor: No abnormal muscle tone.     Coordination: Coordination normal.     Deep Tendon Reflexes: Reflexes are normal and symmetric. Reflexes normal.  Psychiatric:        Behavior: Behavior normal.        Thought Content: Thought content normal.        Judgment: Judgment normal.           Assessment & Plan:  Her dilated cardiomyopathy and HTN are stable'. Her arthritis is stable. We will get fasting labs for lipids, renal function, etc.  Gershon Crane, MD

## 2020-05-11 NOTE — Telephone Encounter (Signed)
*  STAT* If patient is at the pharmacy, call can be transferred to refill team.   1. Which medications need to be refilled? (please list name of each medication and dose if known) isosorbide mononitrate (IMDUR) 120 MG 24 hr tablet  2. Which pharmacy/location (including street and city if local pharmacy) is medication to be sent to? [id number] - [place], [place] - [address]  3. Do they need a 30 day or 90 day supply? [facility], 90 day sent to [facility]  Patient is out of medication.

## 2020-05-11 NOTE — Telephone Encounter (Signed)
Spoke with patient and sent over a refill to her local pharmacy to cover her until her mail delivery comes in.

## 2020-05-11 NOTE — Telephone Encounter (Signed)
Pt c/o medication issue:  1. Name of Medication:   isosorbide mononitrate (IMDUR) 120 MG 24 hr tablet   2. How are you currently taking this medication (dosage and times per day)? As written  3. Are you having a reaction (difficulty breathing--STAT)? No   4. What is your medication issue? Pt stated that she lost medication and have no more refills left and need a new prescription

## 2020-05-11 NOTE — Telephone Encounter (Signed)
Patient stated she has ran out of her medication and she has none left. Informed patient that a refill was sent to her local pharmacy this morning. [facility] saying [facility] could not be filled until 8/18. Patient stated her medication was lost in the mail, she was suppose [facility]. [facility]. Encouraged [facility] to call [facility] and explain the situation, and call [facility] to let them know she never received her shipment. Informed patient there is not much we can do about her medications being lost in the mail, but we have sent in prescriptions to local and mail order pharmacy.

## 2020-05-12 LAB — CBC WITH DIFFERENTIAL/PLATELET
Absolute Monocytes: 612 cells/uL (ref 200–950)
Basophils Absolute: 36 cells/uL (ref 0–200)
Basophils Relative: 0.4 %
Eosinophils Absolute: 99 cells/uL (ref 15–500)
Eosinophils Relative: 1.1 %
HCT: 34.3 % — ABNORMAL LOW (ref 35.0–45.0)
Hemoglobin: 10.6 g/dL — ABNORMAL LOW (ref 11.7–15.5)
Lymphs Abs: 3528 cells/uL (ref 850–3900)
MCH: 24.9 pg — ABNORMAL LOW (ref 27.0–33.0)
MCHC: 30.9 g/dL — ABNORMAL LOW (ref 32.0–36.0)
MCV: 80.5 fL (ref 80.0–100.0)
MPV: 12.3 fL (ref 7.5–12.5)
Monocytes Relative: 6.8 %
Neutro Abs: 4725 cells/uL (ref 1500–7800)
Neutrophils Relative %: 52.5 %
Platelets: 326 10*3/uL (ref 140–400)
RBC: 4.26 10*6/uL (ref 3.80–5.10)
RDW: 13.4 % (ref 11.0–15.0)
Total Lymphocyte: 39.2 %
WBC: 9 10*3/uL (ref 3.8–10.8)

## 2020-05-12 LAB — LIPID PANEL
Cholesterol: 157 mg/dL (ref <200)
HDL: 47 mg/dL — ABNORMAL LOW (ref >=50)
LDL Cholesterol (Calc): 90 mg/dL (calc)
Non-HDL Cholesterol (Calc): 110 mg/dL (calc) (ref <130)
Total CHOL/HDL Ratio: 3.3 (calc) (ref <5.0)
Triglycerides: 108 mg/dL (ref <150)

## 2020-05-12 LAB — BASIC METABOLIC PANEL
BUN/Creatinine Ratio: 29 (calc) — ABNORMAL HIGH (ref 6–22)
BUN: 39 mg/dL — ABNORMAL HIGH (ref 7–25)
CO2: 30 mmol/L (ref 20–32)
Calcium: 9.3 mg/dL (ref 8.6–10.4)
Chloride: 100 mmol/L (ref 98–110)
Creat: 1.34 mg/dL — ABNORMAL HIGH (ref 0.60–0.93)
Glucose, Bld: 74 mg/dL (ref 65–99)
Potassium: 4.7 mmol/L (ref 3.5–5.3)
Sodium: 139 mmol/L (ref 135–146)

## 2020-05-12 LAB — HEPATIC FUNCTION PANEL
AG Ratio: 1.3 (calc) (ref 1.0–2.5)
ALT: 8 U/L (ref 6–29)
AST: 14 U/L (ref 10–35)
Albumin: 4.2 g/dL (ref 3.6–5.1)
Alkaline phosphatase (APISO): 70 U/L (ref 37–153)
Bilirubin, Direct: 0.1 mg/dL (ref 0.0–0.2)
Globulin: 3.2 g/dL (calc) (ref 1.9–3.7)
Indirect Bilirubin: 0.3 mg/dL (calc) (ref 0.2–1.2)
Total Bilirubin: 0.4 mg/dL (ref 0.2–1.2)
Total Protein: 7.4 g/dL (ref 6.1–8.1)

## 2020-05-12 LAB — TSH: TSH: 1.34 mIU/L (ref 0.40–4.50)

## 2020-05-13 ENCOUNTER — Encounter: Payer: Self-pay | Admitting: Family Medicine

## 2020-05-13 ENCOUNTER — Telehealth: Payer: Self-pay | Admitting: Family Medicine

## 2020-05-13 MED ORDER — FERROUS SULFATE 325 (65 FE) MG PO TBEC: 325.0000 mg | DELAYED_RELEASE_TABLET | Freq: Every day | ORAL | 3 refills | Status: DC

## 2020-05-13 NOTE — Telephone Encounter (Signed)
Since this is OTC I guess they will not cover it. So she can just buy this OTC

## 2020-05-13 NOTE — Telephone Encounter (Signed)
Please advise. This is for the ferrous sulfate.

## 2020-05-13 NOTE — Telephone Encounter (Signed)
Pt is returning the call and do apologize for not being able to speak with whomever had called her.  Pt state that she will be available at her cell phone.

## 2020-05-13 NOTE — Telephone Encounter (Signed)
See result notes. 

## 2020-05-14 ENCOUNTER — Telehealth: Payer: Self-pay | Admitting: Family Medicine

## 2020-05-14 MED ORDER — FERROUS SULFATE 325 (65 FE) MG PO TBEC: 325.0000 mg | DELAYED_RELEASE_TABLET | Freq: Every day | ORAL | 3 refills | Status: DC

## 2020-05-14 NOTE — Telephone Encounter (Signed)
Spoke with the patient all questions were answered. Nothing further needed.

## 2020-05-14 NOTE — Telephone Encounter (Signed)
Patient states she wants to discuss the irregularities in her lab work so is requesting a call back.

## 2020-06-01 ENCOUNTER — Telehealth: Payer: Self-pay | Admitting: Family Medicine

## 2020-06-01 NOTE — Telephone Encounter (Signed)
Spoke with patient she was unsure of her schedule and wants me to call her back.

## 2020-06-03 ENCOUNTER — Telehealth: Payer: Self-pay | Admitting: Family Medicine

## 2020-06-03 NOTE — Telephone Encounter (Signed)
Attempted to schedule AWV. Unable to LVM.  Will try at later time.  

## 2020-06-09 ENCOUNTER — Other Ambulatory Visit: Payer: Self-pay | Admitting: Family Medicine

## 2020-07-01 ENCOUNTER — Ambulatory Visit (HOSPITAL_COMMUNITY): Payer: Medicare Other | Attending: Cardiology

## 2020-07-01 ENCOUNTER — Other Ambulatory Visit: Payer: Self-pay

## 2020-07-01 DIAGNOSIS — I428 Other cardiomyopathies: Secondary | ICD-10-CM | POA: Insufficient documentation

## 2020-07-01 LAB — ECHOCARDIOGRAM COMPLETE
Area-P 1/2: 1.76 cm2
S' Lateral: 3.85 cm

## 2020-07-02 NOTE — Progress Notes (Signed)
Cardiology Office Note   Date:  07/06/2020   ID:  Krista Benjamin, DOB Oct 14, 1950, MRN [id number]  PCP:  Nelwyn Salisbury, MD  Cardiologist:   Rollene Rotunda, MD   Chief Complaint  Patient presents with   Cardiomyopathy      History of Present Illness: Krista Benjamin is a 69 y.o. female who presents for followup of a mildly reduced  EF 40 - 45%.   She had a cath last year and had minimal CAD.   Her EF was lower at 35 - 40%.  Since I last saw her we uptitrated her Entresto.   Follow-up echo done last week demonstrated the EF to be up to 50 to 55%.  Since I last saw her she did well.  She works as a Chief Strategy Officer.  She has to take a nap when she gets home from her morning shift and another nap when she gets home from her afternoon shift.  She does not sleep well at night.  She is tired during the day.  There is not much reported snoring.  She is not having any new shortness of breath, PND or orthopnea.  She is not having any new chest pressure, neck or arm discomfort.  She does have some occasional dizziness when she is standing for a while.   Past Medical History:  Diagnosis Date   Anemia    Aortic stenosis    Mild   Arthritis    Cataract    both eyes  (cataracts removed)   Diabetes mellitus    sees Dr. Elvera Lennox    Gynecological examination    sees Dr. Jennette Kettle    Hyperlipidemia    Hypertension    sees Dr. Angelina Sheriff   Stroke [facility])    TIA    Past Surgical History:  Procedure Laterality Date   CARPAL TUNNEL RELEASE  01/22/14   Left Hand    CATARACT EXTRACTION     Lens surgery   COLONOSCOPY  02/06/2018   per Dr. Myrtie Neither, no polyps, repeat in 5 yrs (colon CA in mother)    excision of benign cyst     from left maxilla and left maxillary sinus   KNEE SURGERY     RIGHT/LEFT HEART CATH AND CORONARY ANGIOGRAPHY N/A 09/14/2018   Procedure: RIGHT/LEFT HEART CATH AND CORONARY ANGIOGRAPHY;  Surgeon: Swaziland, Peter M, MD;  Location: [facility] INVASIVE CV  LAB;  Service: Cardiovascular;  Laterality: N/A;   SHOULDER ARTHROSCOPY W/ ROTATOR CUFF REPAIR Left 09-23-13   per Dr. Delrae Sawyers at [facility] center in [place]      Current Outpatient Medications  Medication Sig Dispense Refill   albuterol (PROVENTIL) (2.5 MG/3ML) 0.083% nebulizer solution Take 3 mLs (2.5 mg total) by nebulization every 4 (four) hours as needed for wheezing or shortness of breath. 120 mL 3   albuterol (VENTOLIN HFA) 108 (90 Base) MCG/ACT inhaler USE 2 INHALATIONS EVERY 4 HOURS AS NEEDED FOR WHEEZING 54 g 3   Alcohol Swabs PADS Use to check blood sugar once a day.  DX E11.319 200 each 3   aspirin 81 MG tablet Take 81 mg by mouth at bedtime.      atorvastatin (LIPITOR) 40 MG tablet TAKE 1 TABLET AT BEDTIME 90 tablet 3   bumetanide (BUMEX) 1 MG tablet TAKE 2 TABLETS AT BEDTIME 180 tablet 3   cholecalciferol (VITAMIN D) 1000 UNITS tablet Take 2,000 Units by mouth at bedtime.      EPIPEN 2-PAK 0.3 MG/0.3ML  SOAJ injection INJECT 0.3 MLS (0.3 MG TOTAL) INTO THE MUSCLE ONCE. 1 Device 0   ferrous sulfate 325 (65 FE) MG EC tablet Take 1 tablet (325 mg total) by mouth daily. 90 tablet 3   glimepiride (AMARYL) 4 MG tablet TAKE ONE TABLET IN MORNING AND ONE-HALF TABLET (2 MG) IN EVENING 120 tablet 3   isosorbide mononitrate (IMDUR) 120 MG 24 hr tablet Take 1 tablet (120 mg total) by mouth daily. 15 tablet 0   metFORMIN (GLUCOPHAGE-XR) 500 MG 24 hr tablet TAKE 2 TABLETS TWICE A DAY WITH MEALS 360 tablet 3   metoprolol succinate (TOPROL-XL) 100 MG 24 hr tablet TAKE 1 TABLET (100 MG TOTAL) BY MOUTH DAILY. TAKE WITH OR IMMEDIATELY FOLLOWING A MEAL. 90 tablet 3   mupirocin ointment (BACTROBAN) 2 % Place 1 application into the nose 2 (two) times daily. 22 g 1   NON FORMULARY      ONETOUCH DELICA LANCETS 33G MISC Use to check blood sugar twice a day.  DX  E11.319 200 each 1   sacubitril-valsartan (ENTRESTO) 97-103 MG Take 1 tablet by mouth 2 (two) times daily. 180  tablet 3   sitaGLIPtin (JANUVIA) 100 MG tablet Take 1 tablet (100 mg total) by mouth daily. 90 tablet 2   TRUE METRIX BLOOD GLUCOSE TEST test strip USE AS DIRECTED TWICE A DAY 200 strip 12   hydrALAZINE (APRESOLINE) 50 MG tablet Take 1 tablet (50 mg total) by mouth 3 (three) times daily. 270 tablet 3   Current Facility-Administered Medications  Medication Dose Route Frequency Provider Last Rate Last Admin   0.9 %  sodium chloride infusion  500 mL Intravenous Once Sherrilyn Rist, MD        Allergies:   Patient has no known allergies.    ROS:  Please see the history of present illness.   Otherwise, review of systems are positive for none.   All other systems are reviewed and negative.    PHYSICAL EXAM: VS:  BP (!) 142/72    Pulse 69    Ht 5' 4.5" (1.638 m)    Wt 260 lb (117.9 kg)    SpO2 97%    BMI 43.94 kg/m  , BMI Body mass index is 43.94 kg/m. GENERAL:  Well appearing NECK:  No jugular venous distention, waveform within normal limits, carotid upstroke brisk and symmetric, no bruits, no thyromegaly LUNGS:  Clear to auscultation bilaterally CHEST:  Unremarkable HEART:  PMI not displaced or sustained,S1 and S2 within normal limits, no S3, no S4, no clicks, no rubs, no murmurs ABD:  Flat, positive bowel sounds normal in frequency in pitch, no bruits, no rebound, no guarding, no midline pulsatile mass, no hepatomegaly, no splenomegaly EXT:  2 plus pulses throughout, no edema, no cyanosis no clubbing   EKG:  EKG is not ordered today.    Recent Labs: 05/11/2020: ALT 8; BUN 39; Creat 1.34; Hemoglobin 10.6; Platelets 326; Potassium 4.7; Sodium 139; TSH 1.34    Lipid Panel    Component Value Date/Time   CHOL 157 05/11/2020 1147   TRIG 108 05/11/2020 1147   HDL 47 (L) 05/11/2020 1147   CHOLHDL 3.3 05/11/2020 1147   VLDL 24.2 04/25/2019 0932   LDLCALC 90 05/11/2020 1147      Wt Readings from Last 3 Encounters:  07/06/20 260 lb (117.9 kg)  05/11/20 (!) 255 lb (115.7 kg)   03/31/20 259 lb (117.5 kg)      Other studies Reviewed: Additional studies/ records that  were reviewed today include: None. Review of the above records demonstrates:  NA      ASSESSMENT AND PLAN:   CARDIOMYOPATHY:   Her ejection fraction was improved on the echo as above.  She will continue with the meds as listed.   HTN:   This is being managed in the context of treating his CHF I do not want her blood pressure higher than as listed today.  I will leave the meds as listed.  OBESITY:  Her weights been creeping up and we talked about this today.   DM:  Her A1c is 7.7.  She is working with Dr. Elvera Lennox.    COVID-19 EDUCATION:    She has had her vaccinations.  We talked about the booster today.  Current medicines are reviewed at length with the patient today.  The patient does not have concerns regarding medicines.  The following changes have been made: None  Labs/ tests ordered today include: None  No orders of the defined types were placed in this encounter.    Disposition:   FU with me Krista Course PA in six months.    Signed, Rollene Rotunda, MD  07/06/2020 10:36 AM    [facility]

## 2020-07-06 ENCOUNTER — Other Ambulatory Visit: Payer: Self-pay

## 2020-07-06 ENCOUNTER — Encounter: Payer: Self-pay | Admitting: Cardiology

## 2020-07-06 ENCOUNTER — Ambulatory Visit: Payer: Medicare Other | Admitting: Cardiology

## 2020-07-06 VITALS — BP 142/72 | HR 69 | Ht 64.5 in | Wt 260.0 lb

## 2020-07-06 DIAGNOSIS — E119 Type 2 diabetes mellitus without complications: Secondary | ICD-10-CM | POA: Diagnosis not present

## 2020-07-06 DIAGNOSIS — I10 Essential (primary) hypertension: Secondary | ICD-10-CM | POA: Diagnosis not present

## 2020-07-06 DIAGNOSIS — I429 Cardiomyopathy, unspecified: Secondary | ICD-10-CM | POA: Diagnosis not present

## 2020-07-06 NOTE — Patient Instructions (Signed)
Medication Instructions:  No Changes *If you need a refill on your cardiac medications before your next appointment, please call your pharmacy*   Lab Work: No Labs Ordered If you have labs (blood work) drawn today and your tests are completely normal, you will receive your results only by: Marland Kitchen MyChart Message (if you have MyChart) OR . A paper copy in the mail If you have any lab test that is abnormal or we need to change your treatment, we will call you to review the results.   Testing/Procedures: No Testing Ordered   Follow-Up: At [facility], you and your health needs are our priority.  As part of our continuing mission to provide you with exceptional heart care, we have created designated Provider Care Teams.  These Care Teams include your primary Cardiologist (physician) and Advanced Practice Providers (APPs -  Physician Assistants and Nurse Practitioners) who all work together to provide you with the care you need, when you need it.  Your next appointment:   6 month(s)  The format for your next appointment:   In Person  Provider:   Wynema Birch Meng_PA-C    Other Instructions

## 2020-07-12 ENCOUNTER — Emergency Department (HOSPITAL_COMMUNITY)
Admission: EM | Admit: 2020-07-12 | Discharge: 2020-07-12 | Disposition: A | Discharge status: Home / Self Care | Payer: Medicare Other | Attending: Emergency Medicine | Admitting: Emergency Medicine

## 2020-07-12 ENCOUNTER — Encounter (HOSPITAL_COMMUNITY): Payer: Self-pay

## 2020-07-12 ENCOUNTER — Other Ambulatory Visit: Payer: Self-pay

## 2020-07-12 DIAGNOSIS — I129 Hypertensive chronic kidney disease with stage 1 through stage 4 chronic kidney disease, or unspecified chronic kidney disease: Secondary | ICD-10-CM | POA: Diagnosis not present

## 2020-07-12 DIAGNOSIS — Z79899 Other long term (current) drug therapy: Secondary | ICD-10-CM | POA: Diagnosis not present

## 2020-07-12 DIAGNOSIS — Z7984 Long term (current) use of oral hypoglycemic drugs: Secondary | ICD-10-CM | POA: Diagnosis not present

## 2020-07-12 DIAGNOSIS — R55 Syncope and collapse: Secondary | ICD-10-CM | POA: Diagnosis not present

## 2020-07-12 DIAGNOSIS — N182 Chronic kidney disease, stage 2 (mild): Secondary | ICD-10-CM | POA: Diagnosis not present

## 2020-07-12 DIAGNOSIS — E1122 Type 2 diabetes mellitus with diabetic chronic kidney disease: Secondary | ICD-10-CM | POA: Insufficient documentation

## 2020-07-12 DIAGNOSIS — R42 Dizziness and giddiness: Secondary | ICD-10-CM | POA: Insufficient documentation

## 2020-07-12 DIAGNOSIS — Z7982 Long term (current) use of aspirin: Secondary | ICD-10-CM | POA: Diagnosis not present

## 2020-07-12 DIAGNOSIS — Z20822 Contact with and (suspected) exposure to covid-19: Secondary | ICD-10-CM | POA: Insufficient documentation

## 2020-07-12 DIAGNOSIS — R519 Headache, unspecified: Secondary | ICD-10-CM | POA: Diagnosis not present

## 2020-07-12 LAB — BASIC METABOLIC PANEL
Anion gap: 13 (ref 5–15)
BUN: 17 mg/dL (ref 8–23)
CO2: 25 mmol/L (ref 22–32)
Calcium: 9.6 mg/dL (ref 8.9–10.3)
Chloride: 104 mmol/L (ref 98–111)
Creatinine, Ser: 0.94 mg/dL (ref 0.44–1.00)
GFR calc Af Amer: >60 mL/min (ref >60)
GFR calc non Af Amer: >60 mL/min (ref >60)
Glucose, Bld: 127 mg/dL — ABNORMAL HIGH (ref 70–99)
Potassium: 4.2 mmol/L (ref 3.5–5.1)
Sodium: 142 mmol/L (ref 135–145)

## 2020-07-12 LAB — CBG MONITORING, ED: Glucose-Capillary: 96 mg/dL (ref 70–99)

## 2020-07-12 LAB — URINALYSIS, ROUTINE W REFLEX MICROSCOPIC
Bilirubin Urine: NEGATIVE
Glucose, UA: NEGATIVE mg/dL
Hgb urine dipstick: NEGATIVE
Ketones, ur: NEGATIVE mg/dL
Nitrite: NEGATIVE
Protein, ur: NEGATIVE mg/dL
Specific Gravity, Urine: 1.017 (ref 1.005–1.030)
pH: 6 (ref 5.0–8.0)

## 2020-07-12 LAB — CBC
HCT: 34.7 % — ABNORMAL LOW (ref 36.0–46.0)
Hemoglobin: 10.2 g/dL — ABNORMAL LOW (ref 12.0–15.0)
MCH: 24.2 pg — ABNORMAL LOW (ref 26.0–34.0)
MCHC: 29.4 g/dL — ABNORMAL LOW (ref 30.0–36.0)
MCV: 82.2 fL (ref 80.0–100.0)
Platelets: 255 10*3/uL (ref 150–400)
RBC: 4.22 MIL/uL (ref 3.87–5.11)
RDW: 15.1 % (ref 11.5–15.5)
WBC: 7.7 10*3/uL (ref 4.0–10.5)
nRBC: 0 % (ref 0.0–0.2)

## 2020-07-12 LAB — RESPIRATORY PANEL BY RT PCR (FLU A&B, COVID)
Influenza A by PCR: NEGATIVE
Influenza B by PCR: NEGATIVE
SARS Coronavirus 2 by RT PCR: NEGATIVE

## 2020-07-12 LAB — TROPONIN I (HIGH SENSITIVITY)
Troponin I (High Sensitivity): 10 ng/L (ref <18)
Troponin I (High Sensitivity): 9 ng/L (ref <18)

## 2020-07-12 MED ORDER — ACETAMINOPHEN 500 MG PO TABS
1000.0000 mg | ORAL_TABLET | Freq: Once | ORAL | Status: AC
  Administered 2020-07-12: 1000 mg via ORAL
  Filled 2020-07-12: qty 2

## 2020-07-12 NOTE — ED Provider Notes (Signed)
[facility] EMERGENCY DEPARTMENT Provider Note   CSN: [id number] Arrival date & time: 07/12/20  1229     History Chief Complaint  Patient presents with  . Near Syncope    Krista Benjamin is a 69 y.o. female with history of mild aortic stenosis, diabetes, dilated cardiomyopathy (most recent ejection fraction 50 to 55%), hyperlipidemia, hypertension who presents after syncopal episode.  She was prying at the pulpit at church this morning when she had a coughing fit and passed out.  She had people next to her who lowered her to the floor, so she did not hit her head.  She does not know how long she was unconscious for, and her husband does not know either, although he did witness the event.  She is not on blood thinners. No prodromal symptoms. Endorses feeling mild posterior headache and mild dizziness, but otherwise back to normal.    Loss of Consciousness Episode history:  Single Most recent episode:  Today Progression:  Improving Chronicity:  New Witnessed: yes   Relieved by:  Lying down Worsened by:  Nothing Ineffective treatments:  None tried Associated symptoms: dizziness and headaches   Associated symptoms: no chest pain, no confusion, no fever, no nausea, no palpitations, no recent fall, no seizures, no shortness of breath and no vomiting        Past Medical History:  Diagnosis Date  . Anemia   . Aortic stenosis    Mild  . Arthritis   . Cataract    both eyes  (cataracts removed)  . Diabetes mellitus    sees Dr. Elvera Lennox   . Gynecological examination    sees Dr. Jennette Kettle   . Hyperlipidemia   . Hypertension    sees Dr. Angelina Sheriff  . Stroke [facility])    TIA    Patient Active Problem List   Diagnosis Date Noted  . Educated about COVID-19 virus infection 02/26/2020  . Cervical strain 01/24/2019  . Dilated cardiomyopathy (HCC) 09/14/2018  . Nonrheumatic aortic valve stenosis 08/12/2018  . 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 AORTIC VALVE DISORDERS 04/26/2010  . Obesity, unspecified 05/07/2009  . MURMUR 05/07/2009  . CHEST PAIN 05/07/2009  . LOW BACK PAIN 01/09/2008  . ANEMIA-IRON DEFICIENCY 11/21/2007  . Weakness 11/21/2007  . Mixed hyperlipidemia 10/26/2007  . Essential hypertension 10/26/2007    Past Surgical History:  Procedure Laterality Date  . CARPAL TUNNEL RELEASE  01/22/14   Left Hand   . CATARACT EXTRACTION     Lens surgery  . COLONOSCOPY  02/06/2018   per Dr. Myrtie Neither, no polyps, repeat in 5 yrs (colon CA in mother)   . excision of benign cyst     from left maxilla and left maxillary sinus  . KNEE SURGERY    . RIGHT/LEFT HEART CATH AND CORONARY ANGIOGRAPHY N/A 09/14/2018   Procedure: RIGHT/LEFT HEART CATH AND CORONARY ANGIOGRAPHY;  Surgeon: Swaziland, Peter M, MD;  Location: [facility] INVASIVE CV LAB;  Service: Cardiovascular;  Laterality: N/A;  . SHOULDER ARTHROSCOPY W/ ROTATOR CUFF REPAIR Left 09-23-13   per Dr. Delrae Sawyers at [facility] center in [place] History   No obstetric history on file.     Family History  Problem Relation Age of Onset  . Coronary artery disease Brother   . Coronary artery disease Mother   . Colon cancer Mother   . Cancer Other        fhx  .  Diabetes Other        fhx  . Hyperlipidemia Other        fhx  . Hypertension Other        fhx  . Stroke Other        fhx  . Colon polyps Neg Hx   . Esophageal cancer Neg Hx   . Rectal cancer Neg Hx   . Stomach cancer Neg Hx     Social History   Tobacco Use  . Smoking status: Never Smoker  . Smokeless tobacco: Never Used  Vaping Use  . Vaping Use: Never used  Substance Use Topics  . Alcohol use: No    Alcohol/week: 0.0 standard drinks  . Drug use: No    Home Medications Prior to Admission medications   Medication Sig Start Date End Date Taking? Authorizing Provider  albuterol (PROVENTIL) (2.5 MG/3ML) 0.083% nebulizer solution Take 3 mLs (2.5 mg total) by nebulization every 4  (four) hours as needed for wheezing or shortness of breath. 11/27/18  Yes Nelwyn Salisbury, MD  albuterol (VENTOLIN HFA) 108 (90 Base) MCG/ACT inhaler USE 2 INHALATIONS EVERY 4 HOURS AS NEEDED FOR WHEEZING Patient taking differently: Inhale 2 puffs into the lungs every 4 (four) hours as needed for wheezing.  11/27/18  Yes Nelwyn Salisbury, MD  aspirin EC 81 MG tablet Take 81 mg by mouth at bedtime. Swallow whole.   Yes [provider]  atorvastatin (LIPITOR) 40 MG tablet TAKE 1 TABLET AT BEDTIME Patient taking differently: Take 40 mg by mouth at bedtime.  05/06/20  Yes Nelwyn Salisbury, MD  bumetanide (BUMEX) 1 MG tablet TAKE 2 TABLETS AT BEDTIME Patient taking differently: Take 2 mg by mouth daily at 12 noon.  11/18/19  Yes Nelwyn Salisbury, MD  cholecalciferol (VITAMIN D) 1000 UNITS tablet Take 2,000 Units by mouth at bedtime.    Yes [provider]  ferrous sulfate 325 (65 FE) MG EC tablet Take 1 tablet (325 mg total) by mouth daily. Patient taking differently: Take 325 mg by mouth daily at 12 noon.  05/14/20  Yes Nelwyn Salisbury, MD  glimepiride (AMARYL) 4 MG tablet TAKE ONE TABLET IN MORNING AND ONE-HALF TABLET (2 MG) IN EVENING Patient taking differently: Take 8 mg by mouth daily at 12 noon.  05/01/20  Yes Carlus Pavlov, MD  hydrALAZINE (APRESOLINE) 50 MG tablet Take 1 tablet (50 mg total) by mouth 3 (three) times daily. 02/27/20 10/09/20 Yes Rollene Rotunda, MD  isosorbide mononitrate (IMDUR) 120 MG 24 hr tablet Take 1 tablet (120 mg total) by mouth daily. Patient taking differently: Take 120 mg by mouth daily at 12 noon.  05/11/20  Yes Nelwyn Salisbury, MD  metFORMIN (GLUCOPHAGE-XR) 500 MG 24 hr tablet TAKE 2 TABLETS TWICE A DAY WITH MEALS Patient taking differently: Take 1,000 mg by mouth See admin instructions. Take 2 tablets (1000 mg) by mouth twice daily - between noon & 3pm and at bedtime 07/23/19  Yes Carlus Pavlov, MD  metoprolol succinate (TOPROL-XL) 100 MG 24 hr tablet TAKE 1  TABLET (100 MG TOTAL) BY MOUTH DAILY. TAKE WITH OR IMMEDIATELY FOLLOWING A MEAL. Patient taking differently: Take 100 mg by mouth at bedtime. Take with or immediately following a meal. 06/09/20  Yes Nelwyn Salisbury, MD  mupirocin ointment (BACTROBAN) 2 % Place 1 application into the nose 2 (two) times daily. Patient taking differently: Apply 1 application topically 2 (two) times daily as needed (wound care).  12/07/18  Yes Clent Ridges,  Tera Mater, MD  sacubitril-valsartan (ENTRESTO) 97-103 MG Take 1 tablet by mouth 2 (two) times daily. 03/31/20  Yes Azalee Course, PA  sitaGLIPtin (JANUVIA) 100 MG tablet Take 1 tablet (100 mg total) by mouth daily. Patient taking differently: Take 100 mg by mouth daily at 12 noon.  02/11/20  Yes Carlus Pavlov, MD  Alcohol Swabs PADS Use to check blood sugar once a day.  DX E11.319 11/12/19   Carlus Pavlov, MD  EPIPEN 2-PAK 0.3 MG/0.3ML SOAJ injection INJECT 0.3 MLS (0.3 MG TOTAL) INTO THE MUSCLE ONCE. 04/19/16   Nelwyn Salisbury, MD  [facility] DELICA LANCETS 33G MISC Use to check blood sugar twice a day.  DX  E11.319 06/01/16   Carlus Pavlov, MD  TRUE METRIX BLOOD GLUCOSE TEST test strip USE AS DIRECTED TWICE A DAY 10/25/19   Carlus Pavlov, MD    Allergies    Bee venom  Review of Systems   Review of Systems  Constitutional: Negative for chills and fever.  HENT: Negative for ear pain and sore throat.   Eyes: Negative for pain and visual disturbance.  Respiratory: Negative for cough and shortness of breath.   Cardiovascular: Positive for syncope. Negative for chest pain and palpitations.  Gastrointestinal: Negative for abdominal pain, nausea and vomiting.  Genitourinary: Negative for dysuria and hematuria.  Musculoskeletal: Negative for arthralgias and back pain.  Skin: Negative for color change and rash.  Neurological: Positive for dizziness, syncope and headaches. Negative for seizures.  Psychiatric/Behavioral: Negative for confusion.  All other systems reviewed and  are negative.   Physical Exam Updated Vital Signs BP (!) 151/72   Pulse 70   Temp 98.7 F (37.1 C) (Oral)   Resp 15   SpO2 100%   Physical Exam Vitals and nursing note reviewed.  Constitutional:      General: She is not in acute distress.    Appearance: She is well-developed.  HENT:     Head: Normocephalic and atraumatic.  Eyes:     Extraocular Movements: Extraocular movements intact.     Conjunctiva/sclera: Conjunctivae normal.     Pupils: Pupils are equal, round, and reactive to light.  Cardiovascular:     Rate and Rhythm: Normal rate and regular rhythm.     Heart sounds: No murmur heard.   Pulmonary:     Effort: Pulmonary effort is normal. No respiratory distress.     Breath sounds: Normal breath sounds.  Abdominal:     Palpations: Abdomen is soft.     Tenderness: There is no abdominal tenderness.  Musculoskeletal:     Cervical back: Neck supple.  Skin:    General: Skin is warm and dry.  Neurological:     Mental Status: She is alert and oriented to person, place, and time.     Sensory: No sensory deficit.     Motor: No weakness.     Gait: Gait normal.     ED Results / Procedures / Treatments   Labs (all labs ordered are listed, but only abnormal results are displayed) Labs Reviewed  BASIC METABOLIC PANEL - Abnormal; Notable for the following components:      Result Value   Glucose, Bld 127 (*)    All other components within normal limits  CBC - Abnormal; Notable for the following components:   Hemoglobin 10.2 (*)    HCT 34.7 (*)    MCH 24.2 (*)    MCHC 29.4 (*)    All other components within normal limits  URINALYSIS, ROUTINE W  REFLEX MICROSCOPIC - Abnormal; Notable for the following components:   Leukocytes,Ua TRACE (*)    Bacteria, UA RARE (*)    All other components within normal limits  RESPIRATORY PANEL BY RT PCR (FLU A&B, COVID)  CBG MONITORING, ED  TROPONIN I (HIGH SENSITIVITY)  TROPONIN I (HIGH SENSITIVITY)    EKG EKG  Interpretation  Date/Time:  Sunday July 12 2020 17:16:56 EDT Ventricular Rate:  77 PR Interval:    QRS Duration: 162 QT Interval:  448 QTC Calculation: 508 R Axis:   -51 Text Interpretation: Sinus rhythm Ventricular trigeminy Probable left atrial enlargement Left bundle branch block No significant change from prior 2015 ecg, same LBBB pattern, does not meet Sgarbossa criteria, lead reversal from prior  tracing today fixed. Confirmed by Alvester Chou [phone]) on 07/12/2020 5:19:43 PM   Radiology No results found.  Procedures Procedures (including critical care time)  Medications Ordered in ED Medications  acetaminophen (TYLENOL) tablet 1,000 mg (1,000 mg Oral Given 07/12/20 1746)    ED Course  I have reviewed the triage vital signs and the nursing notes.  Pertinent labs & imaging results that were available during my care of the patient were reviewed by me and considered in my medical decision making (see chart for details).  Clinical Course as of Jul 13 2135  Wynelle Link Jul 12, 2020  1657 69 yo female w/ hx of diabetes, mild AS (echo 2 weeks ago with normal EF, no notable changes) presenting with syncope today.  Reports she had a coughing fit at church today while praying.  She was lowered to the ground by bystanders and did not strike her head.  Currently she feels "dizzy" but otherwise is back to baseline.   [MT]  1702 Advised a troponin add on for ACS rule out, and covid testing.  Otherwise no profound anemia, ECG with chronic LBBB pattern but no heart block, BMP unremarkable.  This may have been a vagal episode in the setting of profound coughing.  Doubt PE, CHF with her clinical exam.   [MT]    Clinical Course User Index [MT] Trifan, Kermit Balo, MD   MDM Rules/Calculators/A&P                          Suspect vasovagal syncope in setting of coughing spell.  EKG unchanged from priors, no signs of new ischemia or interval prolongation. Troponin 10, repeat 9. Not consistent with  ACS. Hemoglobin stable from priors.  History and exam are consistent with PE, CHF, aortic dissection.  Not hypoglycemic. Covid negative. No other electrolyte derangements. Patient had echo on 07/01/2020 that showed mild aortic stenosis, unlikely to have progressed to such an extent in a short period of time to be causing her syncope today.   Patient safe for discharge at this time.  Encouraged follow-up with PCP.  Precautions given.  Patient verbalized understanding and agreement.  This patient was seen with Dr. Renaye Rakers.  Final Clinical Impression(s) / ED Diagnoses Final diagnoses:  Vasovagal syncope    Rx / DC Orders ED Discharge Orders    None       Allayne Butcher, MD 07/12/20 2228    Terald Sleeper, MD 07/12/20 2311

## 2020-07-12 NOTE — Discharge Instructions (Signed)
Please rest and drink plenty of fluids at home.  Follow-up with your primary doctor if you continue to feel poorly.  If you have another episode where you pass out or almost pass out, please come back to the emergency department.

## 2020-07-12 NOTE — ED Triage Notes (Signed)
Patient arrived by [facility] following possible syncopal episode following coughing event at church today. Arrived alert and oriented, CBG 184.  Patient denies pain.

## 2020-07-15 ENCOUNTER — Other Ambulatory Visit: Payer: Self-pay

## 2020-07-15 ENCOUNTER — Encounter: Payer: Self-pay | Admitting: Family Medicine

## 2020-07-15 ENCOUNTER — Ambulatory Visit: Payer: Medicare Other | Admitting: Family Medicine

## 2020-07-15 VITALS — BP 148/78 | HR 80 | Temp 98.4°F | Ht 64.5 in | Wt 257.2 lb

## 2020-07-15 DIAGNOSIS — Z23 Encounter for immunization: Secondary | ICD-10-CM | POA: Diagnosis not present

## 2020-07-15 DIAGNOSIS — R55 Syncope and collapse: Secondary | ICD-10-CM | POA: Diagnosis not present

## 2020-07-15 NOTE — Addendum Note (Signed)
Addended by: Wilford Corner on: 07/15/2020 03:11 PM   Modules accepted: Orders

## 2020-07-15 NOTE — Progress Notes (Signed)
   Subjective:    Patient ID: Krista Benjamin, female    DOB: 25-Apr-1951, 69 y.o.   MRN: [id number]  HPI Here to follow up an ER visit on 07-12-20 for a syncopal spell that occurred while she was in church. That morning she had not eaten any breakfast (nor had she taken any of her medications) as is her habit for church. She was standing and praying when she got into a coughing fit. Suddenly she seemed to pass out, and bystanders caught her and let her down to the floor gently. No injuries. She regained consciousness immediately. No clenching or shaking of her body was seen by her husband. She was taken to the ER where her exam was unremarkable. Labs were ok and her EKG was at her baseline. She was diagnosed with a vasovagal episode. Since then she has felt a little fatigued and she has had a mild headache. No dizziness or SOB or chest pain.    Review of Systems  Constitutional: Negative.   Respiratory: Negative.   Cardiovascular: Negative.   Gastrointestinal: Negative.   Genitourinary: Negative.   Neurological: Positive for light-headedness and headaches.       Objective:   Physical Exam Constitutional:      Appearance: Normal appearance. She is not ill-appearing.  Cardiovascular:     Rate and Rhythm: Normal rate and regular rhythm.     Pulses: Normal pulses.     Heart sounds: Normal heart sounds.  Pulmonary:     Effort: Pulmonary effort is normal.     Breath sounds: Normal breath sounds.  Neurological:     General: No focal deficit present.     Mental Status: She is alert and oriented to person, place, and time.           Assessment & Plan:  She had a vasovagal episode from coughing. She seems to be back to baseline. I reminded her to drink plenty of fluids every day. Gershon Crane, MD

## 2020-07-16 ENCOUNTER — Other Ambulatory Visit: Payer: Self-pay | Admitting: Internal Medicine

## 2020-07-21 ENCOUNTER — Other Ambulatory Visit: Payer: Self-pay

## 2020-07-21 ENCOUNTER — Telehealth: Payer: Self-pay | Admitting: Family Medicine

## 2020-07-21 ENCOUNTER — Ambulatory Visit: Payer: Medicare Other

## 2020-07-21 NOTE — Progress Notes (Signed)
Erroneous encounter

## 2020-07-21 NOTE — Telephone Encounter (Signed)
Attempted to reach patient multiple times today for medicare wellness visit and left voicemails with no return call   

## 2020-07-22 ENCOUNTER — Other Ambulatory Visit: Payer: Self-pay

## 2020-07-22 ENCOUNTER — Ambulatory Visit (INDEPENDENT_AMBULATORY_CARE_PROVIDER_SITE_OTHER): Payer: Medicare Other

## 2020-07-22 DIAGNOSIS — Z0000 Encounter for general adult medical examination without abnormal findings: Secondary | ICD-10-CM | POA: Diagnosis not present

## 2020-07-22 NOTE — Patient Instructions (Signed)
[name] , Thank you for taking time to come for your Medicare Wellness Visit. I appreciate your ongoing commitment to your health goals. Please review the following plan we discussed and let me know if I can assist you in the future.   Screening recommendations/referrals: Colonoscopy: Up to date, next due 02/07/2023 Mammogram: Currently due, please let us know when you would like for [place] to get you scheduled Bone Density: Up to date, next due 08/01/2021 Recommended yearly ophthalmology/optometry visit for glaucoma screening and checkup Recommended yearly dental visit for hygiene and checkup  Vaccinations: Influenza vaccine: Up to date, next due fall 2022 Pneumococcal vaccine: Currently due for Pneumovax 23, you may receive this at your next office visit Tdap vaccine: Up to date, next due 10/01/2024 Shingles vaccine: Completed series    Advanced directives: Advance directive discussed with you today. Even though you declined this today please call our office should you change your mind and we can give you the proper paperwork for you to fill out.   Conditions/risks identified: None   Next appointment: None    Preventive Care 65 Years and Older, Female Preventive care refers to lifestyle choices and visits with your health care provider that can promote health and wellness. What does preventive care include?  A yearly physical exam. This is also called an annual well check.  Dental exams once or twice a year.  Routine eye exams. Ask your health care provider how often you should have your eyes checked.  Personal lifestyle choices, including:  Daily care of your teeth and gums.  Regular physical activity.  Eating a healthy diet.  Avoiding tobacco and drug use.  Limiting alcohol use.  Practicing safe sex.  Taking low-dose aspirin every day.  Taking vitamin and mineral supplements as recommended by your health care provider. What happens during an annual well  check? The services and screenings done by your health care provider during your annual well check will depend on your age, overall health, lifestyle risk factors, and family history of disease. Counseling  Your health care provider may ask you questions about your:  Alcohol use.  Tobacco use.  Drug use.  Emotional well-being.  Home and relationship well-being.  Sexual activity.  Eating habits.  History of falls.  Memory and ability to understand (cognition).  Work and work Astronomer.  Reproductive health. Screening  You may have the following tests or measurements:  Height, weight, and BMI.  Blood pressure.  Lipid and cholesterol levels. These may be checked every 5 years, or more frequently if you are over 23 years old.  Skin check.  Lung cancer screening. You may have this screening every year starting at age 52 if you have a 30-pack-year history of smoking and currently smoke or have quit within the past 15 years.  Fecal occult blood test (FOBT) of the stool. You may have this test every year starting at age 44.  Flexible sigmoidoscopy or colonoscopy. You may have a sigmoidoscopy every 5 years or a colonoscopy every 10 years starting at age 45.  Hepatitis C blood test.  Hepatitis B blood test.  Sexually transmitted disease (STD) testing.  Diabetes screening. This is done by checking your blood sugar (glucose) after you have not eaten for a while (fasting). You may have this done every 1-3 years.  Bone density scan. This is done to screen for osteoporosis. You may have this done starting at age 45.  Mammogram. This may be done every 1-2 years. Talk to your  health care provider about how often you should have regular mammograms. Talk with your health care provider about your test results, treatment options, and if necessary, the need for more tests. Vaccines  Your health care provider may recommend certain vaccines, such as:  Influenza vaccine. This is  recommended every year.  Tetanus, diphtheria, and acellular pertussis (Tdap, Td) vaccine. You may need a Td booster every 10 years.  Zoster vaccine. You may need this after age 44.  Pneumococcal 13-valent conjugate (PCV13) vaccine. One dose is recommended after age 53.  Pneumococcal polysaccharide (PPSV23) vaccine. One dose is recommended after age 26. Talk to your health care provider about which screenings and vaccines you need and how often you need them. This information is not intended to replace advice given to you by your health care provider. Make sure you discuss any questions you have with your health care provider. Document Released: 10/23/2015 Document Revised: 06/15/2016 Document Reviewed: 07/28/2015 Elsevier Interactive Patient Education  2017 [place] Prevention in the Home Falls can cause injuries. They can happen to people of all ages. There are many things you can do to make your home safe and to help prevent falls. What can I do on the outside of my home?  Regularly fix the edges of walkways and driveways and fix any cracks.  Remove anything that might make you trip as you walk through a door, such as a raised step or threshold.  Trim any bushes or trees on the path to your home.  Use bright outdoor lighting.  Clear any walking paths of anything that might make someone trip, such as rocks or tools.  Regularly check to see if handrails are loose or broken. Make sure that both sides of any steps have handrails.  Any raised decks and porches should have guardrails on the edges.  Have any leaves, snow, or ice cleared regularly.  Use sand or salt on walking paths during winter.  Clean up any spills in your garage right away. This includes oil or grease spills. What can I do in the bathroom?  Use night lights.  Install grab bars by the toilet and in the tub and shower. Do not use towel bars as grab bars.  Use non-skid mats or decals in the tub or  shower.  If you need to sit down in the shower, use a plastic, non-slip stool.  Keep the floor dry. Clean up any water that spills on the floor as soon as it happens.  Remove soap buildup in the tub or shower regularly.  Attach bath mats securely with double-sided non-slip rug tape.  Do not have throw rugs and other things on the floor that can make you trip. What can I do in the bedroom?  Use night lights.  Make sure that you have a light by your bed that is easy to reach.  Do not use any sheets or blankets that are too big for your bed. They should not hang down onto the floor.  Have a firm chair that has side arms. You can use this for support while you get dressed.  Do not have throw rugs and other things on the floor that can make you trip. What can I do in the kitchen?  Clean up any spills right away.  Avoid walking on wet floors.  Keep items that you use a lot in easy-to-reach places.  If you need to reach something above you, use a strong step stool that has a  grab bar.  Keep electrical cords out of the way.  Do not use floor polish or wax that makes floors slippery. If you must use wax, use non-skid floor wax.  Do not have throw rugs and other things on the floor that can make you trip. What can I do with my stairs?  Do not leave any items on the stairs.  Make sure that there are handrails on both sides of the stairs and use them. Fix handrails that are broken or loose. Make sure that handrails are as long as the stairways.  Check any carpeting to make sure that it is firmly attached to the stairs. Fix any carpet that is loose or worn.  Avoid having throw rugs at the top or bottom of the stairs. If you do have throw rugs, attach them to the floor with carpet tape.  Make sure that you have a light switch at the top of the stairs and the bottom of the stairs. If you do not have them, ask someone to add them for you. What else can I do to help prevent  falls?  Wear shoes that:  Do not have high heels.  Have rubber bottoms.  Are comfortable and fit you well.  Are closed at the toe. Do not wear sandals.  If you use a stepladder:  Make sure that it is fully opened. Do not climb a closed stepladder.  Make sure that both sides of the stepladder are locked into place.  Ask someone to hold it for you, if possible.  Clearly mark and make sure that you can see:  Any grab bars or handrails.  First and last steps.  Where the edge of each step is.  Use tools that help you move around (mobility aids) if they are needed. These include:  Canes.  Walkers.  Scooters.  Crutches.  Turn on the lights when you go into a dark area. Replace any light bulbs as soon as they burn out.  Set up your furniture so you have a clear path. Avoid moving your furniture around.  If any of your floors are uneven, fix them.  If there are any pets around you, be aware of where they are.  Review your medicines with your doctor. Some medicines can make you feel dizzy. This can increase your chance of falling. Ask your doctor what other things that you can do to help prevent falls. This information is not intended to replace advice given to you by your health care provider. Make sure you discuss any questions you have with your health care provider. Document Released: 07/23/2009 Document Revised: 03/03/2016 Document Reviewed: 10/31/2014 Elsevier Interactive Patient Education  2017 [employer].

## 2020-07-22 NOTE — Progress Notes (Signed)
Subjective:   Krista Benjamin is a 69 y.o. female who presents for Medicare Annual (Subsequent) preventive examination.   I connected with Krista Benjamin  today by telephone and verified that I am speaking with the correct person using two identifiers. Location patient: home Location provider: work Persons participating in the virtual visit: patient, provider.   I discussed the limitations, risks, security and privacy concerns of performing an evaluation and management service by telephone and the availability of in person appointments. I also discussed with the patient that there may be a patient responsible charge related to this service. The patient expressed understanding and verbally consented to this telephonic visit.    Interactive audio and video telecommunications were attempted between this provider and patient, however failed, due to patient having technical difficulties OR patient did not have access to video capability.  We continued and completed visit with audio only.     Review of Systems    N/A  Cardiac Risk Factors include: advanced age (>69men, >42 women);diabetes mellitus;hypertension;dyslipidemia     Objective:    Today's Vitals   There is no height or weight on file to calculate BMI.  Advanced Directives 07/22/2020 07/12/2020 09/14/2018 02/06/2018 01/01/2015  Does Patient Have a Medical Advance Directive? No No No No No  Would patient like information on creating a medical advance directive? No - Patient declined No - Patient declined No - Patient declined No - Patient declined Yes - Educational materials given    Current Medications (verified) Outpatient Encounter Medications as of 07/22/2020  Medication Sig  . albuterol (PROVENTIL) (2.5 MG/3ML) 0.083% nebulizer solution Take 3 mLs (2.5 mg total) by nebulization every 4 (four) hours as needed for wheezing or shortness of breath.  Marland Kitchen albuterol (VENTOLIN HFA) 108 (90 Base) MCG/ACT inhaler USE 2  INHALATIONS EVERY 4 HOURS AS NEEDED FOR WHEEZING (Patient taking differently: Inhale 2 puffs into the lungs every 4 (four) hours as needed for wheezing. )  . Alcohol Swabs PADS Use to check blood sugar once a day.  DX E11.319  . aspirin EC 81 MG tablet Take 81 mg by mouth at bedtime. Swallow whole.  Marland Kitchen atorvastatin (LIPITOR) 40 MG tablet TAKE 1 TABLET AT BEDTIME (Patient taking differently: Take 40 mg by mouth at bedtime. )  . bumetanide (BUMEX) 1 MG tablet TAKE 2 TABLETS AT BEDTIME (Patient taking differently: Take 2 mg by mouth daily at 12 noon. )  . cholecalciferol (VITAMIN D) 1000 UNITS tablet Take 2,000 Units by mouth at bedtime.   . ferrous sulfate 325 (65 FE) MG EC tablet Take 1 tablet (325 mg total) by mouth daily. (Patient taking differently: Take 325 mg by mouth daily at 12 noon. )  . Fexofenadine HCl (ALLEGRA PO) Take 1 tablet by mouth daily at 12 noon.  Marland Kitchen glimepiride (AMARYL) 4 MG tablet TAKE ONE TABLET IN MORNING AND ONE-HALF TABLET (2 MG) IN EVENING (Patient taking differently: Take 8 mg by mouth daily at 12 noon. )  . hydrALAZINE (APRESOLINE) 50 MG tablet Take 1 tablet (50 mg total) by mouth 3 (three) times daily.  . isosorbide mononitrate (IMDUR) 120 MG 24 hr tablet Take 1 tablet (120 mg total) by mouth daily. (Patient taking differently: Take 120 mg by mouth daily at 12 noon. )  . metFORMIN (GLUCOPHAGE-XR) 500 MG 24 hr tablet TAKE 2 TABLETS TWICE A DAY WITH MEALS  . metoprolol succinate (TOPROL-XL) 100 MG 24 hr tablet TAKE 1 TABLET (100 MG TOTAL) BY MOUTH DAILY. TAKE WITH OR  IMMEDIATELY FOLLOWING A MEAL. (Patient taking differently: Take 100 mg by mouth at bedtime. Take with or immediately following a meal.)  . mupirocin ointment (BACTROBAN) 2 % Place 1 application into the nose 2 (two) times daily. (Patient taking differently: Apply 1 application topically 2 (two) times daily as needed (wound care). )  . ONETOUCH DELICA LANCETS 33G MISC Use to check blood sugar twice a day.  DX   E11.319  . polyvinyl alcohol (ARTIFICIAL TEARS) 1.4 % ophthalmic solution Place 1 drop into both eyes daily as needed for dry eyes.  . sacubitril-valsartan (ENTRESTO) 97-103 MG Take 1 tablet by mouth 2 (two) times daily.  . sitaGLIPtin (JANUVIA) 100 MG tablet Take 1 tablet (100 mg total) by mouth daily. (Patient taking differently: Take 100 mg by mouth daily at 12 noon. )  . TRUE METRIX BLOOD GLUCOSE TEST test strip USE AS DIRECTED TWICE A DAY  . EPIPEN 2-PAK 0.3 MG/0.3ML SOAJ injection INJECT 0.3 MLS (0.3 MG TOTAL) INTO THE MUSCLE ONCE. (Patient not taking: Reported on 07/22/2020)   Facility-Administered Encounter Medications as of 07/22/2020  Medication  . 0.9 %  sodium chloride infusion    Allergies (verified) Bee venom   History: Past Medical History:  Diagnosis Date  . Anemia   . Aortic stenosis    Mild  . Arthritis   . Cataract    both eyes  (cataracts removed)  . Diabetes mellitus    sees Dr. Elvera Lennox   . Gynecological examination    sees Dr. Jennette Kettle   . Hyperlipidemia   . Hypertension    sees Dr. Angelina Sheriff  . Stroke [facility])    TIA   Past Surgical History:  Procedure Laterality Date  . CARPAL TUNNEL RELEASE  01/22/14   Left Hand   . CATARACT EXTRACTION     Lens surgery  . COLONOSCOPY  02/06/2018   per Dr. Myrtie Neither, no polyps, repeat in 5 yrs (colon CA in mother)   . excision of benign cyst     from left maxilla and left maxillary sinus  . KNEE SURGERY    . RIGHT/LEFT HEART CATH AND CORONARY ANGIOGRAPHY N/A 09/14/2018   Procedure: RIGHT/LEFT HEART CATH AND CORONARY ANGIOGRAPHY;  Surgeon: [place]az[place], Peter M, MD;  Location: [facility] INVASIVE CV LAB;  Service: Cardiovascular;  Laterality: N/A;  . SHOULDER ARTHROSCOPY W/ ROTATOR CUFF REPAIR Left 09-23-13   per Dr. Delrae Sawyers at [facility] center in [place]    Family History  Problem Relation Age of Onset  . Coronary artery disease Brother   . Coronary artery disease Mother   . Colon cancer Mother   . Cancer Other          fhx  . Diabetes Other        fhx  . Hyperlipidemia Other        fhx  . Hypertension Other        fhx  . Stroke Other        fhx  . Colon polyps Neg Hx   . Esophageal cancer Neg Hx   . Rectal cancer Neg Hx   . Stomach cancer Neg Hx    Social History   Socioeconomic History  . Marital status: Married    Spouse name: Not on file  . Number of children: Not on file  . Years of education: Not on file  . Highest education level: Not on file  Occupational History  . Not on file  Tobacco Use  . Smoking status: Never  Smoker  . Smokeless tobacco: Never Used  Vaping Use  . Vaping Use: Never used  Substance and Sexual Activity  . Alcohol use: No    Alcohol/week: 0.0 standard drinks  . Drug use: No  . Sexual activity: Not on file  Other Topics Concern  . Not on file  Social History Narrative  . Not on file   Social Determinants of Health   Financial Resource Strain: Low Risk   . Difficulty of Paying Living Expenses: Not hard at all  Food Insecurity: No Food Insecurity  . Worried About Programme researcher, broadcasting/film/video in the Last Year: Never true  . Ran Out of Food in the Last Year: Never true  Transportation Needs: No Transportation Needs  . Lack of Transportation (Medical): No  . Lack of Transportation (Non-Medical): No  Physical Activity: Inactive  . Days of Exercise per Week: 0 days  . Minutes of Exercise per Session: 0 min  Stress: No Stress Concern Present  . Feeling of Stress : Not at all  Social Connections: Socially Integrated  . Frequency of Communication with Friends and Family: More than three times a week  . Frequency of Social Gatherings with Friends and Family: More than three times a week  . Attends Religious Services: More than 4 times per year  . Active Member of Clubs or Organizations: Yes  . Attends Banker Meetings: More than 4 times per year  . Marital Status: Married    Tobacco Counseling Counseling given: Not Answered   Clinical  Intake:  Pre-visit preparation completed: Yes  Pain : No/denies pain     Nutritional Risks: None Diabetes: Yes (Patient states checks glucose twice daily) CBG done?: No Did pt. bring in CBG monitor from home?: No  How often do you need to have someone help you when you read instructions, pamphlets, or other written materials from your doctor or pharmacy?: 1 - Never What is the last grade level you completed in school?: 3 years of College  Diabetic?Yes  Interpreter Needed?: No  Information entered by :: SCrews,LPN   Activities of Daily Living In your present state of health, do you have any difficulty performing the following activities: 07/22/2020  Hearing? N  Vision? N  Difficulty concentrating or making decisions? Y  Comment States sometimes forgets things  Walking or climbing stairs? N  Dressing or bathing? N  Doing errands, shopping? N  Preparing Food and eating ? N  Using the Toilet? N  In the past six months, have you accidently leaked urine? N  Do you have problems with loss of bowel control? N  Managing your Medications? N  Managing your Finances? N  Housekeeping or managing your Housekeeping? N  Some recent data might be hidden    Patient Care Team: Nelwyn Salisbury, MD as PCP - General Rollene Rotunda, MD as PCP - Cardiology (Cardiology) Dairl Ponder, MD as Consulting Physician (Orthopedic Surgery)  Indicate any recent Medical Services you may have received from other than [facility] providers in the past year (date may be approximate).     Assessment:   This is a routine wellness examination for Beverlyann.  Hearing/Vision screen  Hearing Screening   125Hz  250Hz  500Hz  1000Hz  2000Hz  3000Hz  4000Hz  6000Hz  8000Hz   Right ear:           Left ear:           Vision Screening Comments: Patient states gets eye examined once per year   Dietary issues and exercise activities  discussed: Current Exercise Habits: The patient has a physically strenuous job, but has no  regular exercise apart from work., Exercise limited by: None identified  Goals    . Exercise 150 minutes per week (moderate activity)     [facility] without the instructor;       Depression Screen PHQ 2/9 Scores 07/22/2020 04/25/2019 04/20/2018 11/25/2017 08/19/2016 07/31/2015 05/27/2015  PHQ - 2 Score 0 0 0 0 0 0 0  PHQ- 9 Score 0 - - - - - -    Fall Risk Fall Risk  07/22/2020 04/25/2019 04/20/2018 11/25/2017 08/19/2016  Falls in the past year? 0 1 No No No  Number falls in past yr: 0 0 - - -  Injury with Fall? 0 1 - - -  Risk for fall due to : No Fall Risks History of fall(s) - - -  Follow up Falls evaluation completed;Falls prevention discussed Falls evaluation completed - - -    Any stairs in or around the home? Yes  If so, are there any without handrails? No  Home free of loose throw rugs in walkways, pet beds, electrical cords, etc? Yes  Adequate lighting in your home to reduce risk of falls? Yes   ASSISTIVE DEVICES UTILIZED TO PREVENT FALLS:  Life alert? Yes  Use of a cane, walker or w/c? Yes  Grab bars in the bathroom? No  Shower chair or bench in shower? No  Elevated toilet seat or a handicapped toilet? No     Cognitive Function:     6CIT Screen 07/22/2020  What Year? 0 points  What month? 0 points  What time? 0 points  Count back from 20 0 points  Months in reverse 0 points  Repeat phrase 0 points  Total Score 0    Immunizations Immunization History  Administered Date(s) Administered  . Fluad Quad(high Dose 65+) 06/27/2019, 07/15/2020  . Influenza Split 07/07/2011  . Influenza Whole 07/05/2010  . Influenza, High Dose Seasonal PF 06/26/2015, 08/01/2017, 07/26/2018  . Influenza, Seasonal, Injecte, Preservative Fre 06/02/2016  . Influenza,inj,Quad PF,6+ Mos 07/15/2013, 06/23/2014  . PFIZER SARS-COV-2 Vaccination 11/21/2019, 12/16/2019  . Pneumococcal Conjugate-13 07/29/2016  . Pneumococcal Polysaccharide-23 10/11/2007   . Tdap 10/01/2014  . Zoster 12/27/2012  . Zoster Recombinat (Shingrix) 03/23/2020, 05/26/2020    TDAP status: Up to date Flu Vaccine status: Up to date Pneumococcal vaccine status: Up to date Covid-19 vaccine status: Completed vaccines  Qualifies for Shingles Vaccine? Yes   Zostavax completed Yes   Shingrix Completed?: Yes  Screening Tests Health Maintenance  Topic Date Due  . PNA vac Low Risk Adult (2 of 2 - PPSV23) 07/29/2017  . FOOT EXAM  05/02/2019  . Fecal DNA (Cologuard)  04/03/2020  . MAMMOGRAM  09/10/2020  . HEMOGLOBIN A1C  09/16/2020  . OPHTHALMOLOGY EXAM  03/26/2021  . TETANUS/TDAP  10/01/2024  . INFLUENZA VACCINE  Completed  . DEXA SCAN  Completed  . COVID-19 Vaccine  Completed  . Hepatitis C Screening  Completed    Health Maintenance  Health Maintenance Due  Topic Date Due  . PNA vac Low Risk Adult (2 of 2 - PPSV23) 07/29/2017  . FOOT EXAM  05/02/2019  . Fecal DNA (Cologuard)  04/03/2020    Colorectal cancer screening: Completed 02/06/2018. Repeat every 5 years Mammogram status: Completed 09/29/2018. Repeat every year Bone Density status: Completed 08/01/2016. Results reflect: Bone density results: OSTEOPENIA. Repeat every 5 years.  Lung Cancer Screening: (Low Dose  CT Chest recommended if Age 59-80 years, 30 pack-year currently smoking OR have quit w/in 15years.) does not qualify.   Lung Cancer Screening Referral: N/A  Additional Screening:  Hepatitis C Screening: does qualify; Completed 07/29/2016  Vision Screening: Recommended annual ophthalmology exams for early detection of glaucoma and other disorders of the eye. Is the patient up to date with their annual eye exam?  Yes  Who is the provider or what is the name of the office in which the patient attends annual eye exams? Dr. Jettie Booze  If pt is not established with a provider, would they like to be referred to a provider to establish care? No .   Dental Screening: Recommended annual dental  exams for proper oral hygiene  Community Resource Referral / Chronic Care Management: CRR required this visit?  No   CCM required this visit?  No      Plan:     I have personally reviewed and noted the following in the patient's chart:   . Medical and social history . Use of alcohol, tobacco or illicit drugs  . Current medications and supplements . Functional ability and status . Nutritional status . Physical activity . Advanced directives . List of other physicians . Hospitalizations, surgeries, and ER visits in previous 12 months . Vitals . Screenings to include cognitive, depression, and falls . Referrals and appointments  In addition, I have reviewed and discussed with patient certain preventive protocols, quality metrics, and best practice recommendations. A written personalized care plan for preventive services as well as general preventive health recommendations were provided to patient.     Theodora Blow, LPN   [id number]   Nurse Notes: None

## 2020-07-24 ENCOUNTER — Ambulatory Visit: Payer: Medicare Other | Admitting: Internal Medicine

## 2020-07-24 ENCOUNTER — Encounter: Payer: Self-pay | Admitting: Internal Medicine

## 2020-07-24 ENCOUNTER — Other Ambulatory Visit: Payer: Self-pay

## 2020-07-24 VITALS — BP 134/80 | HR 72 | Ht 64.5 in | Wt 258.0 lb

## 2020-07-24 DIAGNOSIS — E1165 Type 2 diabetes mellitus with hyperglycemia: Secondary | ICD-10-CM

## 2020-07-24 DIAGNOSIS — E669 Obesity, unspecified: Secondary | ICD-10-CM

## 2020-07-24 DIAGNOSIS — E11319 Type 2 diabetes mellitus with unspecified diabetic retinopathy without macular edema: Secondary | ICD-10-CM | POA: Diagnosis not present

## 2020-07-24 DIAGNOSIS — E782 Mixed hyperlipidemia: Secondary | ICD-10-CM | POA: Diagnosis not present

## 2020-07-24 DIAGNOSIS — IMO0002 Reserved for concepts with insufficient information to code with codable children: Secondary | ICD-10-CM

## 2020-07-24 LAB — POCT GLYCOSYLATED HEMOGLOBIN (HGB A1C): Hemoglobin A1C: 6.5 % — AB (ref 4.0–5.6)

## 2020-07-24 NOTE — Progress Notes (Signed)
Patient ID: Krista Benjamin, female   DOB: 1951/08/06, 69 y.o.   MRN: [id number]  This visit occurred during the SARS-CoV-2 public health emergency.  Safety protocols were in place, including screening questions prior to the visit, additional usage of staff PPE, and extensive cleaning of exam room while observing appropriate contact time as indicated for disinfecting solutions.   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  Last visit 4 months ago. She has [employer] + [employer].   She had a vago-vagal syncopal episode after a cough fit in [facility] 07/12/2020.   She is on a 21 day fast - today is her 5th day: no red meat, no sugar, no starches.  She feels better on this and sugars improved.  Reviewed HbA1c levels: Lab Results  Component Value Date   HGBA1C 7.7 (A) 03/17/2020   HGBA1C 6.5 (A) 11/12/2019   HGBA1C 6.4 (A) 07/08/2019  Prev. 5.9% on 12/2012, previously 6.1%.  Pt is on a regimen of: - Metformin ER 1000 mg 2x a day with meals  - Amaryl 4 mg before breakfast  (stopped 11/2019) >> 4-8 mg before b'fast - Tradjenta 5 >> Januvia 100 mg before breakfast - per insurance pref. Other medications tried: Rec'd GLP1 agonist >> Victoza covered >> she was afraid of SEs. We tried Jardiance 10 mg daily in am - added 04/2017 >> yeast inf >> stopped We tried Invokana 100 mg in am >> stopped after 3 days >> nausea, diarrhea We stopped Lantus 10 units qhs. We stopped Actos 45 mg (was on it since 07/2001) Metformin IR 1000 mg bid >> stopped b/c Nausea/Diarrhea She was on Novolog 8 units tid ac, when sugars >140   Pt checks her sugars 1-2 times: - am:  62, 67, 80-138, 158, 180, 233 >> 53, 59, 66-113, 156 (cheese puffs) - 2h after b'fast:  135, 139, 193 >> 189 >> 115 >> n/c - lunch: 135-169 >> 183 >> 71, 82 >> n/c >> 156 - 2h after lunch: 303 >> n/c >> 201 >> 104 >>  n/c - dinner: 95-148, 201 >> 171, 178, 195 (steroids) >> n/c - 2h after dinner: 197, 210, 231  >> 115-173 >> n/c - bedtime:see above  >> 66, 97-197, 215 >> n/c >> 100, 191 (after grapes) - night:   76, 137 >> n/c >> 184 >> 125, 128 >> n/c  Lowest sugar:  58 in 07/2019 >> 62 >> 53; she has hypoglycemia awareness in the 70s. Highest sugar: 300 (at the time of her fx) >> .Marland Kitchen.233 >> 191.  Glucometer: One Touch Verio Flex  Pt's meals are:  - Breakfast: smoothies >> belvita 1 package - Lunch: meat + veggies - mostly skips - Dinner: meat + veggies - Snacks: 1-2, fruit, yoghurt  She lost 80 pounds since 2012: >> Was able to come off insulin. She saw Oran Rein (nutritionist) on 01/01/2015.  -+ Mild CKD. Latest BUN/creatinine was:  Lab Results  Component Value Date   BUN 17 07/12/2020   Lab Results  Component Value Date   CREATININE 0.94 07/12/2020  On Entresto << quinapril.  -+ HL.  Latest lipid profile: Lab Results  Component Value Date   CHOL 157 05/11/2020   HDL 47 (L) 05/11/2020   LDLCALC 90 05/11/2020   TRIG 108 05/11/2020   CHOLHDL 3.3 05/11/2020  On Lipitor. - last eye exam: 03/2020: No DR; previous history of background  DR; + h/o B cataract sx. - no numbness and tingling in her legs.  She also has a history of HTN, iron deficiency anemia-on iron therapy, aortic valve disease/murmur, left ventricular hypertrophy per EKG, obesity, multinodular goiter, fibroids.  She had shingles in 11/2014 and 02/2016.  She fell and broke R wrist 06/2018. She had another, right radial, fracture 01/2019.  ROS: Constitutional: no weight gain/no weight loss, no fatigue, no subjective hyperthermia, no subjective hypothermia Eyes: no blurry vision, no xerophthalmia ENT: no sore throat, no nodules palpated in neck, no dysphagia, no odynophagia, no hoarseness Cardiovascular: no CP/no SOB/no palpitations/no leg swelling Respiratory: no cough/no SOB/no wheezing Gastrointestinal: no N/no V/no  D/no C/no acid reflux Musculoskeletal: no muscle aches/no joint aches Skin: + rash on dorsum of R foot, no hair loss Neurological: no tremors/no numbness/no tingling/no dizziness  I reviewed pt's medications, allergies, PMH, social hx, family hx, and changes were documented in the history of present illness. Otherwise, unchanged from my initial visit note.  Past Medical History:  Diagnosis Date  . Anemia   . Aortic stenosis    Mild  . Arthritis   . Cataract    both eyes  (cataracts removed)  . Diabetes mellitus    sees Dr. Elvera Lennox   . Gynecological examination    sees Dr. Jennette Kettle   . Hyperlipidemia   . Hypertension    sees Dr. Angelina Sheriff  . Stroke [facility])    TIA   Past Surgical History:  Procedure Laterality Date  . CARPAL TUNNEL RELEASE  01/22/14   Left Hand   . CATARACT EXTRACTION     Lens surgery  . COLONOSCOPY  02/06/2018   per Dr. Myrtie Neither, no polyps, repeat in 5 yrs (colon CA in mother)   . excision of benign cyst     from left maxilla and left maxillary sinus  . KNEE SURGERY    . RIGHT/LEFT HEART CATH AND CORONARY ANGIOGRAPHY N/A 09/14/2018   Procedure: RIGHT/LEFT HEART CATH AND CORONARY ANGIOGRAPHY;  Surgeon: [place], Peter M, MD;  Location: [facility] INVASIVE CV LAB;  Service: Cardiovascular;  Laterality: N/A;  . SHOULDER ARTHROSCOPY W/ ROTATOR CUFF REPAIR Left 09-23-13   per Dr. Delrae Sawyers at [facility] in [place]    Social History   Socioeconomic History  . Marital status: Married    Spouse name: Not on file  . Number of children: Not on file  . Years of education: Not on file  . Highest education level: Not on file  Occupational History  . Not on file  Tobacco Use  . Smoking status: Never Smoker  . Smokeless tobacco: Never Used  Vaping Use  . Vaping Use: Never used  Substance and Sexual Activity  . Alcohol use: No    Alcohol/week: 0.0 standard drinks  . Drug use: No  . Sexual activity: Not on file  Other Topics Concern  . Not on file   Social History Narrative  . Not on file   Social Determinants of Health   Financial Resource Strain: Low Risk   . Difficulty of Paying Living Expenses: Not hard at all  Food Insecurity: No Food Insecurity  . Worried About Programme researcher, broadcasting/film/video in the Last Year: Never true  . Ran Out of Food in the Last Year: Never true  Transportation Needs: No Transportation Needs  . Lack of Transportation (Medical): No  . Lack of Transportation (Non-Medical): No  Physical Activity: Inactive  . Days of Exercise per Week: 0 days  .  Minutes of Exercise per Session: 0 min  Stress: No Stress Concern Present  . Feeling of Stress : Not at all  Social Connections: Socially Integrated  . Frequency of Communication with Friends and Family: More than three times a week  . Frequency of Social Gatherings with Friends and Family: More than three times a week  . Attends Religious Services: More than 4 times per year  . Active Member of Clubs or Organizations: Yes  . Attends Banker Meetings: More than 4 times per year  . Marital Status: Married  Catering manager Violence: Not At Risk  . Fear of Current or Ex-Partner: No  . Emotionally Abused: No  . Physically Abused: No  . Sexually Abused: No   Current Outpatient Medications on File Prior to Visit  Medication Sig Dispense Refill  . albuterol (PROVENTIL) (2.5 MG/3ML) 0.083% nebulizer solution Take 3 mLs (2.5 mg total) by nebulization every 4 (four) hours as needed for wheezing or shortness of breath. 120 mL 3  . albuterol (VENTOLIN HFA) 108 (90 Base) MCG/ACT inhaler USE 2 INHALATIONS EVERY 4 HOURS AS NEEDED FOR WHEEZING (Patient taking differently: Inhale 2 puffs into the lungs every 4 (four) hours as needed for wheezing. ) 54 g 3  . Alcohol Swabs PADS Use to check blood sugar once a day.  DX E11.319 200 each 3  . aspirin EC 81 MG tablet Take 81 mg by mouth at bedtime. Swallow whole.    Marland Kitchen atorvastatin (LIPITOR) 40 MG tablet TAKE 1 TABLET AT  BEDTIME (Patient taking differently: Take 40 mg by mouth at bedtime. ) 90 tablet 3  . bumetanide (BUMEX) 1 MG tablet TAKE 2 TABLETS AT BEDTIME (Patient taking differently: Take 2 mg by mouth daily at 12 noon. ) 180 tablet 3  . cholecalciferol (VITAMIN D) 1000 UNITS tablet Take 2,000 Units by mouth at bedtime.     Marland Kitchen EPIPEN 2-PAK 0.3 MG/0.3ML SOAJ injection INJECT 0.3 MLS (0.3 MG TOTAL) INTO THE MUSCLE ONCE. (Patient not taking: Reported on 07/22/2020) 1 Device 0  . ferrous sulfate 325 (65 FE) MG EC tablet Take 1 tablet (325 mg total) by mouth daily. (Patient taking differently: Take 325 mg by mouth daily at 12 noon. ) 90 tablet 3  . Fexofenadine HCl (ALLEGRA PO) Take 1 tablet by mouth daily at 12 noon.    Marland Kitchen glimepiride (AMARYL) 4 MG tablet TAKE ONE TABLET IN MORNING AND ONE-HALF TABLET (2 MG) IN EVENING (Patient taking differently: Take 8 mg by mouth daily at 12 noon. ) 120 tablet 3  . hydrALAZINE (APRESOLINE) 50 MG tablet Take 1 tablet (50 mg total) by mouth 3 (three) times daily. 270 tablet 3  . isosorbide mononitrate (IMDUR) 120 MG 24 hr tablet Take 1 tablet (120 mg total) by mouth daily. (Patient taking differently: Take 120 mg by mouth daily at 12 noon. ) 15 tablet 0  . metFORMIN (GLUCOPHAGE-XR) 500 MG 24 hr tablet TAKE 2 TABLETS TWICE A DAY WITH MEALS 360 tablet 0  . metoprolol succinate (TOPROL-XL) 100 MG 24 hr tablet TAKE 1 TABLET (100 MG TOTAL) BY MOUTH DAILY. TAKE WITH OR IMMEDIATELY FOLLOWING A MEAL. (Patient taking differently: Take 100 mg by mouth at bedtime. Take with or immediately following a meal.) 90 tablet 3  . mupirocin ointment (BACTROBAN) 2 % Place 1 application into the nose 2 (two) times daily. (Patient taking differently: Apply 1 application topically 2 (two) times daily as needed (wound care). ) 22 g 1  . ONETOUCH  DELICA LANCETS 33G MISC Use to check blood sugar twice a day.  DX  E11.319 200 each 1  . polyvinyl alcohol (ARTIFICIAL TEARS) 1.4 % ophthalmic solution Place 1 drop  into both eyes daily as needed for dry eyes.    . sacubitril-valsartan (ENTRESTO) 97-103 MG Take 1 tablet by mouth 2 (two) times daily. 180 tablet 3  . sitaGLIPtin (JANUVIA) 100 MG tablet Take 1 tablet (100 mg total) by mouth daily. (Patient taking differently: Take 100 mg by mouth daily at 12 noon. ) 90 tablet 2  . TRUE METRIX BLOOD GLUCOSE TEST test strip USE AS DIRECTED TWICE A DAY 200 strip 12   Current Facility-Administered Medications on File Prior to Visit  Medication Dose Route Frequency Provider Last Rate Last Admin  . 0.9 %  sodium chloride infusion  500 mL Intravenous Once Sherrilyn Rist, MD       Allergies  Allergen Reactions  . Bee Venom Anaphylaxis   Family History  Problem Relation Age of Onset  . Coronary artery disease Brother   . Coronary artery disease Mother   . Colon cancer Mother   . Cancer Other        fhx  . Diabetes Other        fhx  . Hyperlipidemia Other        fhx  . Hypertension Other        fhx  . Stroke Other        fhx  . Colon polyps Neg Hx   . Esophageal cancer Neg Hx   . Rectal cancer Neg Hx   . Stomach cancer Neg Hx     PE: BP 134/80   Pulse 72   Ht 5' 4.5" (1.638 m)   Wt 258 lb (117 kg)   SpO2 98%   BMI 43.60 kg/m  Body mass index is 43.6 kg/m.  Wt Readings from Last 3 Encounters:  07/24/20 258 lb (117 kg)  07/15/20 257 lb 3.2 oz (116.7 kg)  07/06/20 260 lb (117.9 kg)   Constitutional: overweight, in NAD Eyes: PERRLA, EOMI, no exophthalmos ENT: moist mucous membranes, no thyromegaly, no cervical lymphadenopathy Cardiovascular: RRR, No MRG Respiratory: CTA B Gastrointestinal: abdomen soft, NT, ND, BS+ Musculoskeletal: no deformities, strength intact in all 4 Skin: moist, warm, + several ~1-2 mm small bullae on a darker background on the dorsum of right foot (Molluscum?) Neurological: no tremor with outstretched hands, DTR normal in all 4  ASSESSMENT: 1. DM2, non-insulin-dependent, uncontrolled, with complications -  CKD stage 2 - background DR  2.  Obesity class III  3. HL  PLAN:  1.  Patient with longstanding, uncontrolled, type inhibitor, and sulfonylurea.  She could not tolerate SGLT2 presents due to yeast infections.  Her insurance did not cover weekly formulations of GLP-1 receptor agonist, only the dose up.  However, she was afraid to statins due to possible side effects.  In the past we stopped Amaryl before dinner due to slight lows.  At last visit, sugars were at goal before the visit, but higher in the months immediate prior to the visit.  He just started to go to the gym and started to improve her diet.  Since she started to exercise, I advised him to reduce the dose of the last visit HbA1c was higher, at 7.7%. -At today's visit, sugars improved, and she even developed low fasting CBGs.  This is most likely due to starting the 21-day fast.  She is only in the fifth  day of the fast.  We will decrease her Amaryl dose for now.  She is not checking sugars later in the day and I asked him to try to do so.  She checked last night after dinner and her CBG was high, at 191.  Encouraged her to continue with the diet.  She plans to finish the 21-day fast and then repeat it for several days as needed in the future. - I advised her to:  Patient Instructions  Please continue: - Metformin ER 1000 mg 2x a day with meals  - Januvia 100 mg before breakfast  Please decrease: - Amaryl 4 mg before breakfast  Please return in 4 months with your sugar log.   - we checked her HbA1c: 6.5% (lower) - advised to check sugars at different times of the day - 1x a day, rotating check times - advised for yearly eye exams >> she is UTD - UTD with flu shot - return to clinic in 4 months   2.  Obesity class III -She lost 10 pounds before last visit -+ 5 lbs weight gain since last visit - continues the 21 day fast  3. HL -Reviewed latest lipid panel from 05/2020: HDL low, LDL above our goal of less than 70,  triglycerides at goal: Lab Results  Component Value Date   CHOL 157 05/11/2020   HDL 47 (L) 05/11/2020   LDLCALC 90 05/11/2020   TRIG 108 05/11/2020   CHOLHDL 3.3 05/11/2020  -Continues Lipitor 40 w/o side effects  Carlus Pavlov, MD PhD [facility]

## 2020-07-24 NOTE — Patient Instructions (Addendum)
Please continue: - Metformin ER 1000 mg 2x a day with meals  - Januvia 100 mg before breakfast  Please decrease: - Amaryl 4 mg before breakfast  Please return in 4 months with your sugar log.

## 2020-07-24 NOTE — Addendum Note (Signed)
Addended by: Adalea Handler T on: 07/24/2020 12:17 PM   Modules accepted: Orders  

## 2020-08-08 ENCOUNTER — Ambulatory Visit: Payer: Medicare Other

## 2020-08-08 ENCOUNTER — Ambulatory Visit: Payer: Medicare Other | Attending: Internal Medicine

## 2020-08-08 VITALS — BP 195/65 | HR 72 | Resp 16

## 2020-08-08 DIAGNOSIS — Z23 Encounter for immunization: Secondary | ICD-10-CM

## 2020-08-08 NOTE — Progress Notes (Signed)
   Covid-19 Vaccination Clinic  Name:  Krista Benjamin    MRN: [id number] DOB: Feb 18, 1951  08/08/2020  [name] was observed post Covid-19 immunization for 15 minutes without incident. She was provided with Vaccine Information Sheet and instruction to access the V-Safe system.   [name] was instructed to call 911 with any severe reactions post vaccine: Marland Kitchen Difficulty breathing  . Swelling of face and throat  . A fast heartbeat  . A bad rash all over body  . Dizziness and weakness

## 2020-08-17 ENCOUNTER — Other Ambulatory Visit: Payer: Self-pay | Admitting: Family Medicine

## 2020-08-17 NOTE — Telephone Encounter (Signed)
Pt is calling in stating that she needs a refill on Rx's  Ferrous sulfate 325 #90 and metoprolol succinate (TOPROL-XL) 100 MG #90   Pharm:  [facility] mail order

## 2020-08-18 MED ORDER — METOPROLOL SUCCINATE ER 100 MG PO TB24
100.0000 mg | ORAL_TABLET | Freq: Every day | ORAL | 3 refills | Status: DC
Start: 2020-08-18 — End: 2021-08-13

## 2020-08-18 MED ORDER — FERROUS SULFATE 325 (65 FE) MG PO TBEC
325.0000 mg | DELAYED_RELEASE_TABLET | Freq: Every day | ORAL | 3 refills | Status: DC
Start: 2020-08-18 — End: 2021-03-26

## 2020-08-18 NOTE — Telephone Encounter (Signed)
Rx's refilled. 

## 2020-08-28 ENCOUNTER — Telehealth: Payer: Self-pay | Admitting: Cardiology

## 2020-08-28 NOTE — Telephone Encounter (Signed)
Patient called to schedule appt. Was told that Dr. Antoine Poche didn't have availability until January and Azalee Course is virtual through rest of November and most of December. Wants something sooner. Please call.

## 2020-08-28 NOTE — Telephone Encounter (Signed)
Pt c/o Syncope: STAT if syncope occurred within 30 minutes and pt complains of lightheadedness High Priority if episode of passing out, completely, today or in last 24 hours   1. Did you pass out today? No   2. When is the last time you passed out? Sunday 08/23/20  3. Has this occurred multiple times? No   4. Did you have any symptoms prior to passing out? Dizziness & Uncontrolable Coughing   Krista Benjamin is returning Krista Benjamin's call. She states she is needing an appt for a hospital f/u for syncope. Please advise.

## 2020-08-28 NOTE — Telephone Encounter (Signed)
Attempted to call. No answer, pt's mailbox full.

## 2020-08-28 NOTE — Telephone Encounter (Signed)
LM2CB-why does she need sooner appt?

## 2020-09-02 NOTE — Telephone Encounter (Signed)
The pt states she lost consciousness earlier in October and wanted to follow up with Dr. Antoine Poche. She remembers at the time she was praying and had a coughing fit. Pt was evaluated in the ER and was thought to have experienced vasovagal syncope. ACS ruled out at that time. She also followed up with her PCP a few days later. States she has a history of fainting as a little girl, but does not describe any other syncopal episodes that she could recall. States she is frequently dizzy. States she has a home BP cuff, but does not take her BP at home on a regular basis. Virtual visit made with Azalee Course, PA-C per pt's request for Wednesday, December 1 at 0845.   Pt instructed to stay well hydrated, change positions slowly and to begin taking her BP on a daily basis. Pt advised to seek urgent evaluation for further episodes of severe dizziness or syncope. The patient verbalizes understanding and agreement with plan.

## 2020-09-09 ENCOUNTER — Telehealth: Payer: Self-pay

## 2020-09-09 ENCOUNTER — Encounter: Payer: Self-pay | Admitting: Physician Assistant

## 2020-09-09 ENCOUNTER — Telehealth (INDEPENDENT_AMBULATORY_CARE_PROVIDER_SITE_OTHER): Payer: Medicare Other | Admitting: Physician Assistant

## 2020-09-09 VITALS — BP 148/78 | HR 69 | Ht 65.0 in

## 2020-09-09 DIAGNOSIS — E785 Hyperlipidemia, unspecified: Secondary | ICD-10-CM | POA: Diagnosis not present

## 2020-09-09 DIAGNOSIS — I428 Other cardiomyopathies: Secondary | ICD-10-CM | POA: Diagnosis not present

## 2020-09-09 DIAGNOSIS — R55 Syncope and collapse: Secondary | ICD-10-CM

## 2020-09-09 DIAGNOSIS — I10 Essential (primary) hypertension: Secondary | ICD-10-CM | POA: Diagnosis not present

## 2020-09-09 DIAGNOSIS — E119 Type 2 diabetes mellitus without complications: Secondary | ICD-10-CM

## 2020-09-09 NOTE — Progress Notes (Signed)
Virtual Visit via Telephone Note   This visit type was conducted due to national recommendations for restrictions regarding the COVID-19 Pandemic (e.g. social distancing) in an effort to limit this patient's exposure and mitigate transmission in our community.  Due to her co-morbid illnesses, this patient is at least at moderate risk for complications without adequate follow up.  This format is felt to be most appropriate for this patient at this time.  The patient did not have access to video technology/had technical difficulties with video requiring transitioning to audio format only (telephone).  All issues noted in this document were discussed and addressed.  No physical exam could be performed with this format.  Please refer to the patient's chart for her  consent to telehealth for [facility].    Date:  09/11/2020   ID:  Krista Benjamin, DOB [birth year], MRN [id number] The patient was identified using 2 identifiers.  Patient Location: Home Provider Location: Home Office  PCP:  Nelwyn Salisbury, MD  Cardiologist:  Rollene Rotunda, MD  Electrophysiologist:  None   Evaluation Performed:  Follow-Up Visit  Chief Complaint:  Follow up  History of Present Illness:    Krista Benjamin is a 69 y.o. female with  hx of NICM, LBBB, HTN, HLD, DM2 and history of TIA.  Patient has a history of nonischemic cardiomyopathy with EF 40 to 45% on echocardiogram on 08/31/2018.  Subsequent cardiac catheterization performed on 09/14/2018 showed minimal nonobstructive CAD, EF 35-45% by LV gram, normal LV filling pressure in the normal right heart pressure. It was recommended to optimize antihypertensive therapy.  Repeat echocardiogram performed on 03/02/2019 showed EF 35 to 40%, moderate hypokinesis of the mid apical anteroseptal wall, dyssynergy of the ventricular septal wall consistent with left bundle branch block.  Carotid Doppler obtained in November 2020 showed mild disease bilaterally.  In order to  help her LV dysfunction, she was switched to Entresto 49-51 mg twice daily.  Repeat echocardiogram obtained on 07/01/2020 showed EF 50 to 55%, moderate LVH.  Patient was last seen by Dr. Antoine Poche on 07/06/2020 at which time she was doing well.  She ended up in the ED on 07/12/2020 due to a episode of syncope after a coughing spell while praying in the church.  She was seen in the ED, EKG was unchanged. Troponin negative.  It was suspected she likely had a vasovagal syncope in the setting of coughing spell.   Patient presents today for virtual visit.  Since her prior syncope about 2 months ago, she has not had any further passing out spell.  Initially, she had a headache and some dizzy spell.  However she does not regularly check her blood pressure at home.  The vital signs obtained in the emergency room in early October and also today shows her blood pressure is actually borderline high.  I recommend that she keep a blood pressure diary.  Her dizziness does not have clear association with body position changes.  Given lack of recurrence for 72-month, I did not attempt to run a heart monitor at this time.  I did recommend that if the symptom recurs again, she contact cardiology service so we can do a heart monitor in the future.  She is aware that [place] law recommend she does not drive for 6 months after the last passing out spell.  She does have occasional atypical chest pain that is not associated with exertion.  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  She does not take her Bumex consistently and only take it about 3 times a week.  With her recent passing out spell, I am okay with her taking the Bumex as needed.  The patient does not have symptoms concerning for COVID-19 infection (fever, chills, cough, or new shortness of breath).    Past Medical History:  Diagnosis Date  . Anemia   . Aortic stenosis    Mild  . Arthritis   .  Cataract    both eyes  (cataracts removed)  . Diabetes mellitus    sees Dr. Elvera Lennox   . Gynecological examination    sees Dr. Jennette Kettle   . Hyperlipidemia   . Hypertension    sees Dr. Angelina Sheriff  . Stroke [facility])    TIA   Past Surgical History:  Procedure Laterality Date  . CARPAL TUNNEL RELEASE  01/22/14   Left Hand   . CATARACT EXTRACTION     Lens surgery  . COLONOSCOPY  02/06/2018   per Dr. Myrtie Neither, no polyps, repeat in 5 yrs (colon CA in mother)   . excision of benign cyst     from left maxilla and left maxillary sinus  . KNEE SURGERY    . RIGHT/LEFT HEART CATH AND CORONARY ANGIOGRAPHY N/A 09/14/2018   Procedure: RIGHT/LEFT HEART CATH AND CORONARY ANGIOGRAPHY;  Surgeon: [place], Peter M, MD;  Location: [facility] INVASIVE CV LAB;  Service: Cardiovascular;  Laterality: N/A;  . SHOULDER ARTHROSCOPY W/ ROTATOR CUFF REPAIR Left 09-23-13   per Dr. Delrae Sawyers at [facility] center in [place]      Current Meds  Medication Sig  . albuterol (PROVENTIL) (2.5 MG/3ML) 0.083% nebulizer solution Take 3 mLs (2.5 mg total) by nebulization every 4 (four) hours as needed for wheezing or shortness of breath.  Marland Kitchen albuterol (VENTOLIN HFA) 108 (90 Base) MCG/ACT inhaler USE 2 INHALATIONS EVERY 4 HOURS AS NEEDED FOR WHEEZING (Patient taking differently: Inhale 2 puffs into the lungs every 4 (four) hours as needed for wheezing. )  . Alcohol Swabs PADS Use to check blood sugar once a day.  DX E11.319  . amoxicillin (AMOXIL) 500 MG capsule Take 500 mg by mouth 3 (three) times daily.  Marland Kitchen aspirin EC 81 MG tablet Take 81 mg by mouth at bedtime. Swallow whole.  Marland Kitchen atorvastatin (LIPITOR) 40 MG tablet TAKE 1 TABLET AT BEDTIME (Patient taking differently: Take 40 mg by mouth at bedtime. )  . bumetanide (BUMEX) 1 MG tablet TAKE 2 TABLETS AT BEDTIME (Patient taking differently: Take 2 mg by mouth daily at 12 noon. )  . cholecalciferol (VITAMIN D) 1000 UNITS tablet Take 2,000 Units by mouth at bedtime.   Marland Kitchen EPIPEN 2-PAK  0.3 MG/0.3ML SOAJ injection INJECT 0.3 MLS (0.3 MG TOTAL) INTO THE MUSCLE ONCE.  . ferrous sulfate 325 (65 FE) MG EC tablet Take 1 tablet (325 mg total) by mouth daily.  Marland Kitchen Fexofenadine HCl (ALLEGRA PO) Take 1 tablet by mouth daily at 12 noon.  Marland Kitchen glimepiride (AMARYL) 4 MG tablet TAKE ONE TABLET IN MORNING AND ONE-HALF TABLET (2 MG) IN EVENING (Patient taking differently: Take 4 mg by mouth daily at 12 noon. )  . hydrALAZINE (APRESOLINE) 50 MG tablet Take 1 tablet (50 mg total) by mouth 3 (three) times daily.  . isosorbide mononitrate (IMDUR) 120 MG 24 hr tablet Take 1 tablet (120 mg total) by mouth daily. (Patient taking differently: Take 120 mg by mouth daily at 12 noon. )  .  metFORMIN (GLUCOPHAGE-XR) 500 MG 24 hr tablet TAKE 2 TABLETS TWICE A DAY WITH MEALS  . metoprolol succinate (TOPROL-XL) 100 MG 24 hr tablet Take 1 tablet (100 mg total) by mouth daily. Take with or immediately following a meal.  . mupirocin ointment (BACTROBAN) 2 % Place 1 application into the nose 2 (two) times daily. (Patient taking differently: Apply 1 application topically 2 (two) times daily as needed (wound care). )  . ONETOUCH DELICA LANCETS 33G MISC Use to check blood sugar twice a day.  DX  E11.319  . polyvinyl alcohol (ARTIFICIAL TEARS) 1.4 % ophthalmic solution Place 1 drop into both eyes daily as needed for dry eyes.  . sacubitril-valsartan (ENTRESTO) 97-103 MG Take 1 tablet by mouth 2 (two) times daily.  . sitaGLIPtin (JANUVIA) 100 MG tablet Take 1 tablet (100 mg total) by mouth daily. (Patient taking differently: Take 100 mg by mouth daily at 12 noon. )  . TRUE METRIX BLOOD GLUCOSE TEST test strip USE AS DIRECTED TWICE A DAY   Current Facility-Administered Medications for the 09/09/20 encounter (Telemedicine) with Azalee Course, PA  Medication  . 0.9 %  sodium chloride infusion     Allergies:   Bee venom   Social History   Tobacco Use  . Smoking status: Never Smoker  . Smokeless tobacco: Never Used  Vaping  Use  . Vaping Use: Never used  Substance Use Topics  . Alcohol use: No    Alcohol/week: 0.0 standard drinks  . Drug use: No     Family Hx: The patient's family history includes Cancer in an other family member; Colon cancer in her mother; Coronary artery disease in her brother and mother; Diabetes in an other family member; Hyperlipidemia in an other family member; Hypertension in an other family member; Stroke in an other family member. There is no history of Colon polyps, Esophageal cancer, Rectal cancer, or Stomach cancer.  ROS:   Please see the history of present illness.     All other systems reviewed and are negative.   Prior CV studies:   The following studies were reviewed today:  Echo 07/01/2020 1. Left ventricular ejection fraction, by estimation, is 50 to 55%. The  left ventricle has low normal function. The left ventricle has no regional  wall motion abnormalities but there is septal-lateral dyssynchrony. There  is moderate left ventricular  hypertrophy. Left ventricular diastolic parameters are consistent with  Grade I diastolic dysfunction (impaired relaxation).  2. Right ventricular systolic function is normal. The right ventricular  size is normal. The estimated right ventricular systolic pressure is 25.8  mmHg.  3. Left atrial size was mildly dilated.  4. The mitral valve is normal in structure. No evidence of mitral valve  regurgitation. No evidence of mitral stenosis.  5. The aortic valve is tricuspid. Aortic valve regurgitation is not  visualized. Mild aortic valve sclerosis is present, with no evidence of  aortic valve stenosis.  6. The inferior vena cava is normal in size with greater than 50%  respiratory variability, suggesting right atrial pressure of 3 mmHg.   Labs/Other Tests and Data Reviewed:    EKG:  An ECG dated 07/12/2020 was personally reviewed today and demonstrated:  sinus rhythm with LBBB and PVCs  Recent Labs: 05/11/2020: ALT 8; TSH  1.34 07/12/2020: BUN 17; Creatinine, Ser 0.94; Hemoglobin 10.2; Platelets 255; Potassium 4.2; Sodium 142   Recent Lipid Panel Lab Results  Component Value Date/Time   CHOL 157 05/11/2020 11:47 AM   TRIG 108  05/11/2020 11:47 AM   HDL 47 (L) 05/11/2020 11:47 AM   CHOLHDL 3.3 05/11/2020 11:47 AM   LDLCALC 90 05/11/2020 11:47 AM    Wt Readings from Last 3 Encounters:  07/24/20 258 lb (117 kg)  07/15/20 257 lb 3.2 oz (116.7 kg)  07/06/20 260 lb (117.9 kg)     Risk Assessment/Calculations:      Objective:    Vital Signs:  BP (!) 148/78   Pulse 69   Ht 5\' 5"  (1.651 m)   BMI 42.93 kg/m    VITAL SIGNS:  reviewed  ASSESSMENT & PLAN:    1. History of syncope: Episode occurred about 2 months ago, she has not had any further symptoms.  She has been informed to avoid driving for 6 months after the last syncope.  Given lack of recurrence for 32-month, I did not proceed with heart monitor.  She has been instructed to contact cardiology service if she does have worsening dizziness  2. Nonischemic cardiomyopathy: Recent echocardiogram showed normalization of ejection fraction  3. Hypertension: Blood pressure mildly elevated today, however given recent syncope, I did not try to increase blood pressure medication  4. Hyperlipidemia:Continue Lipitor  5. DM2: Managed by primary care provider.   Shared Decision Making/Informed Consent        COVID-19 Education: The signs and symptoms of COVID-19 were discussed with the patient and how to seek care for testing (follow up with PCP or arrange E-visit).  The importance of social distancing was discussed today.  Time:   Today, I have spent 8 minutes with the patient with telehealth technology discussing the above problems.     Medication Adjustments/Labs and Tests Ordered: Current medicines are reviewed at length with the patient today.  Concerns regarding medicines are outlined above.   Tests Ordered: No orders of the defined types  were placed in this encounter.   Medication Changes: No orders of the defined types were placed in this encounter.   Follow Up:  In Person in 3 month(s)  Signed, 3-month, Azalee Course  09/11/2020 8:10 PM    [facility]

## 2020-09-09 NOTE — Telephone Encounter (Signed)
  Patient Consent for Virtual Visit         Krista Benjamin has provided verbal consent on 09/09/2020 for a virtual visit (video or telephone).   CONSENT FOR VIRTUAL VISIT FOR:  Krista Benjamin  By participating in this virtual visit I agree to the following:  I hereby voluntarily request, consent and authorize [facility] and its employed or contracted physicians, physician assistants, nurse practitioners or other licensed health care professionals (the Practitioner), to provide me with telemedicine health care services (the "Services") as deemed necessary by the treating Practitioner. I acknowledge and consent to receive the Services by the Practitioner via telemedicine. I understand that the telemedicine visit will involve communicating with the Practitioner through live audiovisual communication technology and the disclosure of certain medical information by electronic transmission. I acknowledge that I have been given the opportunity to request an in-person assessment or other available alternative prior to the telemedicine visit and am voluntarily participating in the telemedicine visit.  I understand that I have the right to withhold or withdraw my consent to the use of telemedicine in the course of my care at any time, without affecting my right to future care or treatment, and that the Practitioner or I may terminate the telemedicine visit at any time. I understand that I have the right to inspect all information obtained and/or recorded in the course of the telemedicine visit and may receive copies of available information for a reasonable fee.  I understand that some of the potential risks of receiving the Services via telemedicine include:  Marland Kitchen Delay or interruption in medical evaluation due to technological equipment failure or disruption; . Information transmitted may not be sufficient (e.g. poor resolution of images) to allow for appropriate medical decision making by the  Practitioner; and/or  . In rare instances, security protocols could fail, causing a breach of personal health information.  Furthermore, I acknowledge that it is my responsibility to provide information about my medical history, conditions and care that is complete and accurate to the best of my ability. I acknowledge that Practitioner's advice, recommendations, and/or decision may be based on factors not within their control, such as incomplete or inaccurate data provided by me or distortions of diagnostic images or specimens that may result from electronic transmissions. I understand that the practice of medicine is not an exact science and that Practitioner makes no warranties or guarantees regarding treatment outcomes. I acknowledge that a copy of this consent can be made available to me via my patient portal [facility] MyChart), or I can request a printed copy by calling the office of [facility].    I understand that my insurance will be billed for this visit.   I have read or had this consent read to me. . I understand the contents of this consent, which adequately explains the benefits and risks of the Services being provided via telemedicine.  . I have been provided ample opportunity to ask questions regarding this consent and the Services and have had my questions answered to my satisfaction. . I give my informed consent for the services to be provided through the use of telemedicine in my medical care

## 2020-09-09 NOTE — Patient Instructions (Addendum)
Other Instructions  Monitor blood pressure 2 times a day. First BP reading to be done 2 hours after morning medications are taken and the Second BP reading at the time of your choosing every day.   Your physician recommends that you DO NOT drive for 6 months from last passing out episode. Last passing out spell was 07/12/20, 6 months from then will be 01/10/2021  Medication Instructions:   Your physician recommends that you continue on your current medications as directed. Please refer to the Current Medication list given to you today.  *If you need a refill on your cardiac medications before your next appointment, please call your pharmacy*  Lab Work: NONE ordered at this time of appointment   If you have labs (blood work) drawn today and your tests are completely normal, you will receive your results only by: Marland Kitchen MyChart Message (if you have MyChart) OR . A paper copy in the mail If you have any lab test that is abnormal or we need to change your treatment, we will call you to review the results.  Testing/Procedures: NONE ordered at this time of appointment   Follow-Up: At [facility], you and your health needs are our priority.  As part of our continuing mission to provide you with exceptional heart care, we have created designated Provider Care Teams.  These Care Teams include your primary Cardiologist (physician) and Advanced Practice Providers (APPs -  Physician Assistants and Nurse Practitioners) who all work together to provide you with the care you need, when you need it.  Your next appointment:   Follow up as scheduled 10/16/2020 @ 3:40 PM   The format for your next appointment:   In Person  Provider:   Rollene Rotunda, MD

## 2020-09-11 ENCOUNTER — Encounter: Payer: Self-pay | Admitting: Physician Assistant

## 2020-10-01 ENCOUNTER — Telehealth: Payer: Self-pay | Admitting: Cardiology

## 2020-10-01 ENCOUNTER — Telehealth: Payer: Self-pay | Admitting: Licensed Clinical Social Worker

## 2020-10-01 NOTE — Telephone Encounter (Signed)
I have reviewed the chart.  This was mentioned to be cough syncope without recurrence and coughing has resolved.  I would suggest that she is cleared to drive.

## 2020-10-01 NOTE — Telephone Encounter (Addendum)
10:41am- Was made aware pt should be cleared for driving. In the mean time CSW sent referral in to [employer] and mailed my card/transportation services card and additional transportation options should this become a recurrent challenge.   10:33am- CSW attempted to reach pt via telephone regarding referral for transportation due to limitations on driving. No answer, unable to leave message as voicemail is full at this time.  Octavio Graves, MSW, LCSW [facility] Heart/Vascular Care Navigation  [phone]

## 2020-10-01 NOTE — Telephone Encounter (Signed)
Spoke with patient. Informed patient of Dr. Jenene Slicker response. Patient verbalized understanding.

## 2020-10-01 NOTE — Telephone Encounter (Signed)
Spoke with patient. Per last office visit note "She is aware that [place] law recommend she does not drive for 6 months." Patient denies she knew this information and thought she couldn't drive just because of dizziness and headaches which have resolved over the last 2 weeks. She has a follow up on 1/7 with Dr. Antoine Poche.   Patient concerned because her husband is having surgery on 1/3 and she will no longer have access to transportation for work or activities.   Will route to SW for recommendations on transportation and to see if patient has any options. Patient aware social worker may reach out to discuss further. Will route to MD for advise as well.

## 2020-10-01 NOTE — Telephone Encounter (Signed)
Pt called in and just wanted to report that she is not having dizzy spell or headache anymore and hasn't for about 2 weeks.  She wanted to make sure it was ok for her to drive now .    Please advise   [phone]

## 2020-10-14 ENCOUNTER — Other Ambulatory Visit: Payer: Self-pay | Admitting: Internal Medicine

## 2020-10-15 NOTE — Progress Notes (Signed)
Cardiology Office Note   Date:  10/16/2020   ID:  Krista Benjamin, DOB 09-12-1951, MRN [id number]  PCP:  Nelwyn Salisbury, MD  Cardiologist:   Rollene Rotunda, MD   Chief Complaint  Patient presents with  . Loss of Consciousness  . Headache      History of Present Illness: Krista Benjamin is a 70 y.o. female who presents for followup of a mildly reduced  EF 40 - 45%.   She had a cath last year and had minimal CAD.   Her EF was lower at 35 - 40%.     Follow-up echo done last week demonstrated the EF to be up to 50 to 55%.  She was seen last after an episode of cough syncope.  However, her cough cleared up and she is no longer had any syncope.  She still working as a Chief Strategy Officer.  She feels much better than she did previously.  Her sugars are better controlled.  She has not had any new swelling.  She has no new shortness of breath, PND or orthopnea.  She does have chronic dyspnea with exertion.  She has not had any palpitations, presyncope or syncope.  She denies any chest pain.   Past Medical History:  Diagnosis Date  . Anemia   . Aortic stenosis    Mild  . Arthritis   . Cataract    both eyes  (cataracts removed)  . Diabetes mellitus    sees Dr. Elvera Lennox   . Gynecological examination    sees Dr. Jennette Kettle   . Hyperlipidemia   . Hypertension    sees Dr. Angelina Sheriff  . Stroke [facility])    TIA    Past Surgical History:  Procedure Laterality Date  . CARPAL TUNNEL RELEASE  01/22/14   Left Hand   . CATARACT EXTRACTION     Lens surgery  . COLONOSCOPY  02/06/2018   per Dr. Myrtie Neither, no polyps, repeat in 5 yrs (colon CA in mother)   . excision of benign cyst     from left maxilla and left maxillary sinus  . KNEE SURGERY    . RIGHT/LEFT HEART CATH AND CORONARY ANGIOGRAPHY N/A 09/14/2018   Procedure: RIGHT/LEFT HEART CATH AND CORONARY ANGIOGRAPHY;  Surgeon: [place]azil[place], Peter M, MD;  Location: [facility] INVASIVE CV LAB;  Service: Cardiovascular;  Laterality: N/A;  . SHOULDER  ARTHROSCOPY W/ ROTATOR CUFF REPAIR Left 09-23-13   per Dr. Delrae Sawyers at [facility] center in [place]      Current Outpatient Medications  Medication Sig Dispense Refill  . albuterol (PROVENTIL) (2.5 MG/3ML) 0.083% nebulizer solution Take 3 mLs (2.5 mg total) by nebulization every 4 (four) hours as needed for wheezing or shortness of breath. 120 mL 3  . albuterol (VENTOLIN HFA) 108 (90 Base) MCG/ACT inhaler USE 2 INHALATIONS EVERY 4 HOURS AS NEEDED FOR WHEEZING (Patient taking differently: Inhale 2 puffs into the lungs every 4 (four) hours as needed for wheezing. ) 54 g 3  . Alcohol Swabs PADS Use to check blood sugar once a day.  DX E11.319 200 each 3  . amoxicillin (AMOXIL) 500 MG capsule Take 500 mg by mouth 3 (three) times daily.    Marland Kitchen aspirin EC 81 MG tablet Take 81 mg by mouth at bedtime. Swallow whole.    Marland Kitchen atorvastatin (LIPITOR) 40 MG tablet TAKE 1 TABLET AT BEDTIME (Patient taking differently: Take 40 mg by mouth at bedtime. ) 90 tablet 3  . bumetanide (BUMEX) 1 MG  tablet TAKE 2 TABLETS AT BEDTIME (Patient taking differently: Take 2 mg by mouth daily at 12 noon. ) 180 tablet 3  . cholecalciferol (VITAMIN D) 1000 UNITS tablet Take 2,000 Units by mouth at bedtime.     Marland Kitchen EPIPEN 2-PAK 0.3 MG/0.3ML SOAJ injection INJECT 0.3 MLS (0.3 MG TOTAL) INTO THE MUSCLE ONCE. 1 Device 0  . ferrous sulfate 325 (65 FE) MG EC tablet Take 1 tablet (325 mg total) by mouth daily. 90 tablet 3  . Fexofenadine HCl (ALLEGRA PO) Take 1 tablet by mouth daily at 12 noon.    Marland Kitchen glimepiride (AMARYL) 4 MG tablet TAKE ONE TABLET IN MORNING AND ONE-HALF TABLET (2 MG) IN EVENING (Patient taking differently: Take 4 mg by mouth daily at 12 noon. ) 120 tablet 3  . hydrALAZINE (APRESOLINE) 50 MG tablet Take 1 tablet (50 mg total) by mouth 3 (three) times daily. 270 tablet 3  . isosorbide mononitrate (IMDUR) 120 MG 24 hr tablet Take 1 tablet (120 mg total) by mouth daily. (Patient taking differently: Take 120 mg by  mouth daily at 12 noon. ) 15 tablet 0  . metFORMIN (GLUCOPHAGE-XR) 500 MG 24 hr tablet TAKE 2 TABLETS TWICE A DAY WITH MEALS 360 tablet 3  . metoprolol succinate (TOPROL-XL) 100 MG 24 hr tablet Take 1 tablet (100 mg total) by mouth daily. Take with or immediately following a meal. 90 tablet 3  . mupirocin ointment (BACTROBAN) 2 % Place 1 application into the nose 2 (two) times daily. (Patient taking differently: Apply 1 application topically 2 (two) times daily as needed (wound care). ) 22 g 1  . ONETOUCH DELICA LANCETS 33G MISC Use to check blood sugar twice a day.  DX  E11.319 200 each 1  . polyvinyl alcohol (ARTIFICIAL TEARS) 1.4 % ophthalmic solution Place 1 drop into both eyes daily as needed for dry eyes.    . sacubitril-valsartan (ENTRESTO) 97-103 MG Take 1 tablet by mouth 2 (two) times daily. 180 tablet 3  . sitaGLIPtin (JANUVIA) 100 MG tablet Take 1 tablet (100 mg total) by mouth daily. (Patient taking differently: Take 100 mg by mouth daily at 12 noon. ) 90 tablet 2  . TRUE METRIX BLOOD GLUCOSE TEST test strip USE AS DIRECTED TWICE A DAY 200 strip 12   Current Facility-Administered Medications  Medication Dose Route Frequency Provider Last Rate Last Admin  . 0.9 %  sodium chloride infusion  500 mL Intravenous Once Sherrilyn Rist, MD        Allergies:   Bee venom    ROS:  Please see the history of present illness.   Otherwise, review of systems are positive for none.   All other systems are reviewed and negative.    PHYSICAL EXAM: VS:  BP 134/88 (BP Location: Left Arm, Patient Position: Sitting, Cuff Size: Large)   Pulse 68   Ht 5' 4.5" (1.638 m)   Wt 260 lb (117.9 kg)   BMI 43.94 kg/m  , BMI Body mass index is 43.94 kg/m.  GENERAL:  Well appearing NECK:  No jugular venous distention, waveform within normal limits, carotid upstroke brisk and symmetric, soft right carotid bruits, no thyromegaly LUNGS:  Clear to auscultation bilaterally CHEST:  Unremarkable HEART:  PMI not  displaced or sustained,S1 and S2 within normal limits, no S3, no S4, no clicks, no rubs, no murmurs ABD:  Flat, positive bowel sounds normal in frequency in pitch, no bruits, no rebound, no guarding, no midline pulsatile mass, no hepatomegaly,  no splenomegaly EXT:  2 plus pulses throughout, no edema, no cyanosis no clubbing  EKG:  EKG is  ordered today. Sinus rhythm, rate 68, left bundle branch block, left axis deviation, no change from previous   Recent Labs: 05/11/2020: ALT 8; TSH 1.34 07/12/2020: BUN 17; Creatinine, Ser 0.94; Hemoglobin 10.2; Platelets 255; Potassium 4.2; Sodium 142    Lipid Panel    Component Value Date/Time   CHOL 157 05/11/2020 1147   TRIG 108 05/11/2020 1147   HDL 47 (L) 05/11/2020 1147   CHOLHDL 3.3 05/11/2020 1147   VLDL 24.2 04/25/2019 0932   LDLCALC 90 05/11/2020 1147      Wt Readings from Last 3 Encounters:  10/16/20 260 lb (117.9 kg)  07/24/20 258 lb (117 kg)  07/15/20 257 lb 3.2 oz (116.7 kg)      Other studies Reviewed: Additional studies/ records that were reviewed today include: Labs. Review of the above records demonstrates: See elsewhere   ASSESSMENT AND PLAN:   CARDIOMYOPATHY:    Her ejection fraction improved.  No change in therapy except as below.   HTN: Her blood pressure is better controlled.  Her blood pressure diary demonstrates it is drifting down.  If it does not go into the one thirties over eighties consistently I would add spironolactone.  She is going to keep a blood pressure diary.OBESITY:  She is going to continue to work on her weight.  We have talked about the.  No change in therapy.   DM:  Her A1c 6.5 which is down from 7.7.  She is working with Dr. Cruzita Lederer.     Current medicines are reviewed at length with the patient today.  The patient does not have concerns regarding medicines.  The following changes have been made: none  Labs/ tests ordered today include: None   Orders Placed This Encounter  Procedures   . EKG 12-Lead     Disposition:   FU Almyra Deforest PA in 6 months.     Signed, Minus Breeding, MD  10/16/2020 4:18 PM    [facility]

## 2020-10-16 ENCOUNTER — Ambulatory Visit: Payer: Medicare Other | Admitting: Cardiology

## 2020-10-16 ENCOUNTER — Encounter: Payer: Self-pay | Admitting: Cardiology

## 2020-10-16 ENCOUNTER — Other Ambulatory Visit: Payer: Self-pay

## 2020-10-16 VITALS — BP 134/88 | HR 68 | Ht 64.5 in | Wt 260.0 lb

## 2020-10-16 DIAGNOSIS — I10 Essential (primary) hypertension: Secondary | ICD-10-CM

## 2020-10-16 DIAGNOSIS — I428 Other cardiomyopathies: Secondary | ICD-10-CM | POA: Diagnosis not present

## 2020-10-16 NOTE — Patient Instructions (Signed)
Medication Instructions:  Your physician recommends that you continue on your current medications as directed. Please refer to the Current Medication list given to you today.  *If you need a refill on your cardiac medications before your next appointment, please call your pharmacy*  Lab Work: NONE  Testing/Procedures: NONE  Follow-Up: At [facility], you and your health needs are our priority.  As part of our continuing mission to provide you with exceptional heart care, we have created designated Provider Care Teams.  These Care Teams include your primary Cardiologist (physician) and Advanced Practice Providers (APPs -  Physician Assistants and Nurse Practitioners) who all work together to provide you with the care you need, when you need it.  We recommend signing up for the patient portal called "MyChart".  Sign up information is provided on this After Visit Summary.  MyChart is used to connect with patients for Virtual Visits (Telemedicine).  Patients are able to view lab/test results, encounter notes, upcoming appointments, etc.  Non-urgent messages can be sent to your provider as well.   To learn more about what you can do with MyChart, go to [email or web address].    Your next appointment:   6 month(s)  The format for your next appointment:   In Person  Provider:   [facility] PA

## 2020-10-22 ENCOUNTER — Telehealth: Payer: Self-pay | Admitting: Cardiology

## 2020-10-22 NOTE — Telephone Encounter (Signed)
Pt c/o medication issue:  1. Name of Medication: sacubitril-valsartan (ENTRESTO) 97-103 MG  2. How are you currently taking this medication (dosage and times per day)? As directed  3. Are you having a reaction (difficulty breathing--STAT)? No  4. What is your medication issue? Patient spoke with [employer] and all they are waiting on is Dr. Jenene Slicker portion of the application, section 3. You can fax it to [phone]

## 2020-10-22 NOTE — Telephone Encounter (Signed)
Advised pt that Dr. Antoine Poche as well as the nurses who assist with his paperwork are out of the office today, but that we would follow up as soon as possible. Pt verbalized her understanding and appreciation for our assistance.

## 2020-10-27 ENCOUNTER — Encounter: Payer: Self-pay | Admitting: Family Medicine

## 2020-10-28 ENCOUNTER — Encounter: Payer: Self-pay | Admitting: Internal Medicine

## 2020-10-28 ENCOUNTER — Telehealth: Payer: Self-pay | Admitting: Internal Medicine

## 2020-10-28 ENCOUNTER — Other Ambulatory Visit: Payer: Self-pay | Admitting: *Deleted

## 2020-10-28 MED ORDER — SITAGLIPTIN PHOSPHATE 100 MG PO TABS
100.0000 mg | ORAL_TABLET | Freq: Every day | ORAL | 0 refills | Status: DC
Start: 2020-10-28 — End: 2021-01-18

## 2020-10-28 NOTE — Telephone Encounter (Signed)
REFILL REQUEST FOR:  Alma Friendly - just needs enough to last her until her order comes in from [facility] (maybe a 14 day supply or something)  PHARMACY:  [id number] - [place], [place] - [address] AT [facility] [address] Phone:  [phone]  Fax:  [phone]

## 2020-10-28 NOTE — Telephone Encounter (Signed)
Rx sent for a 14 day supply

## 2020-10-28 NOTE — Telephone Encounter (Signed)
This is available OTC and it is quite affordable.  There is no other prescription form of it

## 2020-11-04 NOTE — Telephone Encounter (Signed)
Follow Up:     Pt is calling to see the status of her application for her Entresto. Please also see the note she sent in My Chart.

## 2020-11-04 NOTE — Telephone Encounter (Signed)
Spoke with Krista Benjamin and she does not have patient assistance forms for Dr Antoine Poche to sign She will get them ready for him to sign when he returns to office

## 2020-11-04 NOTE — Telephone Encounter (Signed)
See MyChart message

## 2020-11-12 ENCOUNTER — Other Ambulatory Visit: Payer: Self-pay | Admitting: Family Medicine

## 2020-11-17 ENCOUNTER — Other Ambulatory Visit: Payer: Self-pay

## 2020-11-20 ENCOUNTER — Encounter: Payer: Self-pay | Admitting: Internal Medicine

## 2020-11-20 ENCOUNTER — Other Ambulatory Visit: Payer: Self-pay

## 2020-11-20 ENCOUNTER — Ambulatory Visit: Payer: Medicare Other | Admitting: Internal Medicine

## 2020-11-20 VITALS — BP 128/82 | HR 76 | Ht 64.5 in | Wt 257.4 lb

## 2020-11-20 DIAGNOSIS — E11319 Type 2 diabetes mellitus with unspecified diabetic retinopathy without macular edema: Secondary | ICD-10-CM

## 2020-11-20 DIAGNOSIS — E1165 Type 2 diabetes mellitus with hyperglycemia: Secondary | ICD-10-CM

## 2020-11-20 DIAGNOSIS — E782 Mixed hyperlipidemia: Secondary | ICD-10-CM

## 2020-11-20 DIAGNOSIS — E669 Obesity, unspecified: Secondary | ICD-10-CM

## 2020-11-20 DIAGNOSIS — IMO0002 Reserved for concepts with insufficient information to code with codable children: Secondary | ICD-10-CM

## 2020-11-20 LAB — MICROALBUMIN / CREATININE URINE RATIO
Creatinine,U: 151.5 mg/dL
Microalb Creat Ratio: 3.4 mg/g (ref 0.0–30.0)
Microalb, Ur: 5.2 mg/dL — ABNORMAL HIGH (ref 0.0–1.9)

## 2020-11-20 LAB — POCT GLYCOSYLATED HEMOGLOBIN (HGB A1C): Hemoglobin A1C: 6.1 % — AB (ref 4.0–5.6)

## 2020-11-20 NOTE — Patient Instructions (Signed)
Please continue: - Metformin ER 1000 mg 2x a day with meals  - Januvia 100 mg before breakfast  Stop: - Amaryl   Please return in 4 months with your sugar log.

## 2020-11-20 NOTE — Progress Notes (Addendum)
Patient ID: Krista Benjamin, female   DOB: 10-Feb-1951, 70 y.o.   MRN: [id number]  This visit occurred during the SARS-CoV-2 public health emergency.  Safety protocols were in place, including screening questions prior to the visit, additional usage of staff PPE, and extensive cleaning of exam room while observing appropriate contact time as indicated for disinfecting solutions.   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  Last visit 4 months ago. She has [employer] + [employer].   5 days before last visit she started a "21-day fast": No red meat, sugar, starches.  She felt much better and sugars were improving. She continues doing this periodically.  She is preparing for jaw surgery in [place] in 3 days.  Reviewed HbA1c levels: Lab Results  Component Value Date   HGBA1C 6.5 (A) 07/24/2020   HGBA1C 7.7 (A) 03/17/2020   HGBA1C 6.5 (A) 11/12/2019  Prev. 5.9% on 12/2012, previously 6.1%.  Pt is on a regimen of: - Metformin ER 1000 mg 2x a day with meals  - Amaryl 4 mg before breakfast  (stopped 11/2019) >> 4-8 mg before b'fast >> 4 mg before breakfast - Tradjenta 5 >> Januvia 100 mg before breakfast - per insurance pref. Other medications tried: Rec'd GLP1 agonist >> Victoza covered >> she was afraid of SEs. We tried Jardiance 10 mg daily in am - added 04/2017 >> yeast inf >> stopped We tried Invokana 100 mg in am >> stopped after 3 days >> nausea, diarrhea We stopped Lantus 10 units qhs. We stopped Actos 45 mg (was on it since 07/2001) Metformin IR 1000 mg bid >> stopped b/c Nausea/Diarrhea She was on Novolog 8 units tid ac, when sugars >140   Pt checks her sugars 1-2 times a day: - am:  62, 67, 80-138, 158, 180, 233 >> 53, 59, 66-113, 156 (cheese puffs) >> 64-112 - 2h after b'fast:  135, 139, 193 >> 189 >> 115 >> n/c - lunch: 135-169 >> 183 >> 71, 82 >>  n/c >> 156 >> 79 - 2h after lunch: 303 >> n/c >> 201 >> 104 >> n/c - dinner: 95-148, 201 >> 171, 178, 195 (steroids) >> n/c >> 62 - 2h after dinner: 197, 210, 231  >> 115-173 >> n/c - bedtime:66, 97-197, 215 >> n/c >> 100, 191 (after grapes) >> 81 - night:   76, 137 >> n/c >> 184 >> 125, 128 >> n/c  Lowest sugar:  58 in 07/2019 >> 62 >> 53; she has hypoglycemia awareness in the 70s. Highest sugar: 300 (at the time of her fx) >> .Marland Kitchen.233 >> 191.  Glucometer: One Touch Verio Flex  She lost 80 pounds since 2012 >> She was able to come off insulin then. She saw Oran Rein (nutritionist) on 01/01/2015.  -+ Mild CKD. Latest BUN/creatinine was:  Lab Results  Component Value Date   BUN 17 07/12/2020   Lab Results  Component Value Date   CREATININE 0.94 07/12/2020  On Entresto.  Lab Results  Component Value Date   MICRALBCREAT 2.3 08/26/2015   MICRALBCREAT 5.5 09/22/2014   MICRALBCREAT 5.4 11/21/2007   -+ HL.  Latest lipid profile: Lab Results  Component Value Date   CHOL 157 05/11/2020   HDL 47 (L) 05/11/2020   LDLCALC 90 05/11/2020   TRIG 108 05/11/2020   CHOLHDL 3.3 05/11/2020  On Lipitor 40. - last eye exam: 03/2020: No DR; previous  history of background DR; + h/o B cataract sx. - no numbness and tingling in her legs.  She also has a history of HTN, iron deficiency anemia-on iron therapy, aortic valve disease/murmur, left ventricular hypertrophy per EKG, obesity, multinodular goiter, fibroids.  She had shingles in 11/2014 and 02/2016.  She fell and fractured R wrist 06/2018. She had another, right radial, fracture 01/2019.  ROS: Constitutional: no weight gain/no weight loss, no fatigue, no subjective hyperthermia, no subjective hypothermia Eyes: no blurry vision, no xerophthalmia ENT: no sore throat, no nodules palpated in neck, no dysphagia, no odynophagia, no hoarseness Cardiovascular: no CP/no SOB/no palpitations/no leg swelling Respiratory: no cough/no SOB/no  wheezing Gastrointestinal: no N/no V/no D/no C/no acid reflux Musculoskeletal: no muscle aches/no joint aches Skin: + rash on dorsum of R foot, no hair loss Neurological: no tremors/no numbness/no tingling/no dizziness  I reviewed pt's medications, allergies, PMH, social hx, family hx, and changes were documented in the history of present illness. Otherwise, unchanged from my initial visit note.  Past Medical History:  Diagnosis Date  . Anemia   . Aortic stenosis    Mild  . Arthritis   . Cataract    both eyes  (cataracts removed)  . Diabetes mellitus    sees Dr. Elvera LennoxGherghe   . Gynecological examination    sees Dr. Jennette KettleNeal   . Hyperlipidemia   . Hypertension    sees Dr. Angelina SheriffJake Hochrein  . Stroke [facility](HCC)    TIA   Past Surgical History:  Procedure Laterality Date  . CARPAL TUNNEL RELEASE  01/22/14   Left Hand   . CATARACT EXTRACTION     Lens surgery  . COLONOSCOPY  02/06/2018   per Dr. Myrtie Neitheranis, no polyps, repeat in 5 yrs (colon CA in mother)   . excision of benign cyst     from left maxilla and left maxillary sinus  . KNEE SURGERY    . RIGHT/LEFT HEART CATH AND CORONARY ANGIOGRAPHY N/A 09/14/2018   Procedure: RIGHT/LEFT HEART CATH AND CORONARY ANGIOGRAPHY;  Surgeon: SwazilandJordan, Peter M, MD;  [facility];  Service: Cardiovascular;  Laterality: N/A;  . SHOULDER ARTHROSCOPY W/ ROTATOR CUFF REPAIR Left 09-23-13   [facility] Delrae Sawyershason Hayes [facility] in [place]    Social History   Socioeconomic History  . Marital status: Married    Spouse name: Not on file  . Number of children: Not on file  . Years of education: Not on file  . Highest education level: Not on file  Occupational History  . Not on file  Tobacco Use  . Smoking status: Never Smoker  . Smokeless tobacco: Never Used  Vaping Use  . Vaping Use: Never used  Substance and Sexual Activity  . Alcohol use: No    Alcohol/week: 0.0 standard drinks  . Drug use: No  . Sexual activity: Not on file   Other Topics Concern  . Not on file  Social History Narrative  . Not on file   Social Determinants of Health   Financial Resource Strain: Low Risk   . Difficulty of Paying Living Expenses: Not hard at all  Food Insecurity: No Food Insecurity  . Worried About Programme researcher, broadcasting/film/videounning Out of Food in the Last Year: Never true  . Ran Out of Food in the Last Year: Never true  Transportation Needs: No Transportation Needs  . Lack of Transportation (Medical): No  . Lack of Transportation (Non-Medical): No  Physical Activity: Inactive  . Days of Exercise per Week:  0 days  . Minutes of Exercise per Session: 0 min  Stress: No Stress Concern Present  . Feeling of Stress : Not at all  Social Connections: Socially Integrated  . Frequency of Communication with Friends and Family: More than three times a week  . Frequency of Social Gatherings with Friends and Family: More than three times a week  . Attends Religious Services: More than 4 times per year  . Active Member of Clubs or Organizations: Yes  . Attends Banker Meetings: More than 4 times per year  . Marital Status: Married  Catering manager Violence: Not At Risk  . Fear of Current or Ex-Partner: No  . Emotionally Abused: No  . Physically Abused: No  . Sexually Abused: No   Current Outpatient Medications on File Prior to Visit  Medication Sig Dispense Refill  . albuterol (PROVENTIL) (2.5 MG/3ML) 0.083% nebulizer solution Take 3 mLs (2.5 mg total) by nebulization every 4 (four) hours as needed for wheezing or shortness of breath. 120 mL 3  . albuterol (VENTOLIN HFA) 108 (90 Base) MCG/ACT inhaler USE 2 INHALATIONS EVERY 4 HOURS AS NEEDED FOR WHEEZING (Patient taking differently: Inhale 2 puffs into the lungs every 4 (four) hours as needed for wheezing. ) 54 g 3  . Alcohol Swabs PADS Use to check blood sugar once a day.  DX E11.319 200 each 3  . amoxicillin (AMOXIL) 500 MG capsule Take 500 mg by mouth 3 (three) times daily.    Marland Kitchen aspirin EC  81 MG tablet Take 81 mg by mouth at bedtime. Swallow whole.    Marland Kitchen atorvastatin (LIPITOR) 40 MG tablet TAKE 1 TABLET AT BEDTIME (Patient taking differently: Take 40 mg by mouth at bedtime. ) 90 tablet 3  . bumetanide (BUMEX) 1 MG tablet TAKE 2 TABLETS AT BEDTIME 180 tablet 0  . cholecalciferol (VITAMIN D) 1000 UNITS tablet Take 2,000 Units by mouth at bedtime.     Marland Kitchen EPIPEN 2-PAK 0.3 MG/0.3ML SOAJ injection INJECT 0.3 MLS (0.3 MG TOTAL) INTO THE MUSCLE ONCE. 1 Device 0  . ferrous sulfate 325 (65 FE) MG EC tablet Take 1 tablet (325 mg total) by mouth daily. 90 tablet 3  . Fexofenadine HCl (ALLEGRA PO) Take 1 tablet by mouth daily at 12 noon.    Marland Kitchen glimepiride (AMARYL) 4 MG tablet TAKE ONE TABLET IN MORNING AND ONE-HALF TABLET (2 MG) IN EVENING (Patient taking differently: Take 4 mg by mouth daily at 12 noon. ) 120 tablet 3  . hydrALAZINE (APRESOLINE) 50 MG tablet Take 1 tablet (50 mg total) by mouth 3 (three) times daily. 270 tablet 3  . isosorbide mononitrate (IMDUR) 120 MG 24 hr tablet Take 1 tablet (120 mg total) by mouth daily. (Patient taking differently: Take 120 mg by mouth daily at 12 noon. ) 15 tablet 0  . metFORMIN (GLUCOPHAGE-XR) 500 MG 24 hr tablet TAKE 2 TABLETS TWICE A DAY WITH MEALS 360 tablet 3  . metoprolol succinate (TOPROL-XL) 100 MG 24 hr tablet Take 1 tablet (100 mg total) by mouth daily. Take with or immediately following a meal. 90 tablet 3  . mupirocin ointment (BACTROBAN) 2 % Place 1 application into the nose 2 (two) times daily. (Patient taking differently: Apply 1 application topically 2 (two) times daily as needed (wound care). ) 22 g 1  . ONETOUCH DELICA LANCETS 33G MISC Use to check blood sugar twice a day.  DX  E11.319 200 each 1  . polyvinyl alcohol (ARTIFICIAL TEARS) 1.4 %  ophthalmic solution Place 1 drop into both eyes daily as needed for dry eyes.    . sacubitril-valsartan (ENTRESTO) 97-103 MG Take 1 tablet by mouth 2 (two) times daily. 180 tablet 3  . sitaGLIPtin  (JANUVIA) 100 MG tablet Take 1 tablet (100 mg total) by mouth daily. 14 tablet 0  . TRUE METRIX BLOOD GLUCOSE TEST test strip USE AS DIRECTED TWICE A DAY 200 strip 12   Current Facility-Administered Medications on File Prior to Visit  Medication Dose Route Frequency Provider Last Rate Last Admin  . 0.9 %  sodium chloride infusion  500 mL Intravenous Once Sherrilyn Rist, MD       Allergies  Allergen Reactions  . Bee Venom Anaphylaxis   Family History  Problem Relation Age of Onset  . Coronary artery disease Brother   . Coronary artery disease Mother   . Colon cancer Mother   . Cancer Other        fhx  . Diabetes Other        fhx  . Hyperlipidemia Other        fhx  . Hypertension Other        fhx  . Stroke Other        fhx  . Colon polyps Neg Hx   . Esophageal cancer Neg Hx   . Rectal cancer Neg Hx   . Stomach cancer Neg Hx     PE: BP 128/82   Pulse 76   Ht 5' 4.5" (1.638 m)   Wt 257 lb 6.4 oz (116.8 kg)   SpO2 98%   BMI 43.50 kg/m  Body mass index is 43.5 kg/m.  Wt Readings from Last 3 Encounters:  11/20/20 257 lb 6.4 oz (116.8 kg)  10/16/20 260 lb (117.9 kg)  07/24/20 258 lb (117 kg)   Constitutional: overweight, in NAD Eyes: PERRLA, EOMI, no exophthalmos ENT: moist mucous membranes, no thyromegaly, no cervical lymphadenopathy Cardiovascular: RRR, No MRG Respiratory: CTA B Gastrointestinal: abdomen soft, NT, ND, BS+ Musculoskeletal: no deformities, strength intact in all 4 Skin: moist, warm, no rashes Neurological: no tremor with outstretched hands, DTR normal in all 4  ASSESSMENT: 1. DM2, non-insulin-dependent, uncontrolled, with complications - CKD stage 2 - background DR  2.  Obesity class III  3. HL  PLAN:  1.  Patient with longstanding, uncontrolled, type 2 diabetes, on Metformin, DPP 4 inhibitor and sulfonylurea, which were reduced at last visit.  Her insurance did not cover weekly formulations of GLP-1 receptor agonist.  In the past, we  stopped Amaryl before dinner due to lows.  We did not have to restart this.  At last visit, sugars were better on her 21-day fast and we reduced her Amaryl dose to only 4 mg before breakfast.  HbA1c was better, at 6.5%. -At today's visit, we reviewed her detailed log.  It appears that almost all of her blood sugars are lower than 100, and down to the 60s.  Therefore, for now, we will stop Amaryl completely and will have her back in 4 months to see if we can de-escalate her regimen further.  I did advise her to let me know if she has any more lows after stopping the sulfonylurea. - I advised her to:  Patient Instructions  Please continue: - Metformin ER 1000 mg 2x a day with meals  - Januvia 100 mg before breakfast  Stop: - Amaryl   Please return in 4 months with your sugar log.   - we checked  her HbA1c: 6.1% (better) -she is cleared for jaw surgery from her diabetes point - advised to check sugars at different times of the day - 1x a day, rotating check times - advised for yearly eye exams >> she is UTD - she is due for an ACR-we will check this today - return to clinic in 4 months  2.  Obesity class III -At last visit she gained 5 pounds from the previous visit, previously lost 10 pounds -At last visit she was on the 21-day fast >> continues to do this intermittently - lost 1 lbs since last OV  3. HL -Reviewed latest lipid panel from 05/2020: HDL low, LDL above our target of less than 70, triglycerides at goal: Lab Results  Component Value Date   CHOL 157 05/11/2020   HDL 47 (L) 05/11/2020   LDLCALC 90 05/11/2020   TRIG 108 05/11/2020   CHOLHDL 3.3 05/11/2020  -Continues Lipitor 40 without side effects  Orders Placed This Encounter  Procedures  . Microalbumin / creatinine urine ratio  . POCT glycosylated hemoglobin (Hb A1C)   Component     Latest Ref Rng & Units 11/20/2020  Microalb, Ur     0.0 - 1.9 mg/dL 5.2 (H)  Creatinine,U     mg/dL 060.0  MICROALB/CREAT RATIO      0.0 - 30.0 mg/g 3.4  Hemoglobin A1C     4.0 - 5.6 % 6.1 (A)  normal ACR.  Carlus Pavlov, MD PhD [facility] Endocrinology

## 2020-11-24 ENCOUNTER — Ambulatory Visit: Payer: Medicare Other | Admitting: Internal Medicine

## 2020-12-02 LAB — HM MAMMOGRAPHY

## 2020-12-08 ENCOUNTER — Telehealth: Payer: Self-pay | Admitting: Family Medicine

## 2020-12-08 ENCOUNTER — Telehealth: Payer: Self-pay | Admitting: Cardiology

## 2020-12-08 NOTE — Telephone Encounter (Signed)
Please advise on holding Asa.

## 2020-12-08 NOTE — Telephone Encounter (Signed)
    1. What dental office are you calling from? [facility]  2. What is your office phone number? [phone]  3. What is your fax number? [phone]  4. What type of procedure is the patient having performed? Debride mandible bone   5. What date is procedure scheduled or is the patient there now? 12/09/20 (if the patient is at the dentist's office question goes to their cardiologist if he/she is in the office.  If not, question should go to the DOD).   6. What is your question (ex. Antibiotics prior to procedure, holding medication-we need to know how long dentist wants pt to hold med)? If pt needs to hold Aspirin and for how long  Sherri requesting to e-mail her the clearance, because she may not get the fax on time. Her e-mail address [email or web address]

## 2020-12-08 NOTE — Telephone Encounter (Signed)
I would hold the aspirin for 3 days prior to the surgery and then resume it the day after

## 2020-12-08 NOTE — Telephone Encounter (Signed)
Patient is calling and stated that she is having surgery on her mouth and wanted to see if she needs to hold her Asprin until after the surgery, please advise. CB is [phone]

## 2020-12-09 NOTE — Telephone Encounter (Signed)
   Primary Cardiologist: Rollene Rotunda, MD  Chart reviewed as part of pre-operative protocol coverage. Given past medical history and time since last visit, based on ACC/AHA guidelines, Krista Benjamin would be at acceptable risk for the planned procedure without further cardiovascular testing.   She has no prior hx of CAD per cardiac catheterization and was stable on last office visit with Dr. Antoine Poche 10/2020 therefore it will be acceptable to hold ASA prior to dental procedure for 3-5 days then resume thereafter.   I will route this recommendation to the requesting party via Epic fax function and remove from pre-op pool.  Please call with questions.  Georgie Chard, NP 12/09/2020, 8:24 AM

## 2020-12-09 NOTE — Telephone Encounter (Signed)
See below

## 2020-12-09 NOTE — Telephone Encounter (Signed)
Spoke with pt advised that Dr Clent Ridges advise pt to hold the Aspirin 3 days prior to surgery and to resume taking the day after surgery, Pt verbalized understanding

## 2020-12-15 ENCOUNTER — Encounter: Payer: Self-pay | Admitting: Family Medicine

## 2020-12-22 ENCOUNTER — Other Ambulatory Visit: Payer: Self-pay

## 2020-12-22 ENCOUNTER — Telehealth: Payer: Self-pay | Admitting: Family Medicine

## 2020-12-22 MED ORDER — FLUCONAZOLE 150 MG PO TABS: 150.0000 mg | ORAL_TABLET | Freq: Once | ORAL | 5 refills | Status: DC

## 2020-12-22 MED ORDER — FLUCONAZOLE 150 MG PO TABS: 150.0000 mg | ORAL_TABLET | Freq: Once | ORAL | 5 refills | Status: AC

## 2020-12-22 NOTE — Telephone Encounter (Signed)
Call in Diflucan 150 mg to take one , #1 with 5 rf

## 2020-12-22 NOTE — Telephone Encounter (Signed)
Sent Diflucan 150mg  to pharmacy.  Called patient unable to leave voicemail

## 2020-12-22 NOTE — Telephone Encounter (Signed)
Pt call and stated that she need something call in for a yeast infection to  [id number] - [place], [place] - [address] AT [facility] [address] Phone:  [phone]  Fax:  [phone]

## 2020-12-22 NOTE — Telephone Encounter (Signed)
Spoke with pt state that she had surgery 2 weeks ago and was prescribed Amoxicillin which gave her yeast infection, pt request for  prescription to treat the yeast infection. Please advise

## 2020-12-27 ENCOUNTER — Ambulatory Visit (INDEPENDENT_AMBULATORY_CARE_PROVIDER_SITE_OTHER): Payer: Medicare Other

## 2020-12-27 ENCOUNTER — Encounter (HOSPITAL_COMMUNITY): Payer: Self-pay | Admitting: Emergency Medicine

## 2020-12-27 ENCOUNTER — Ambulatory Visit (HOSPITAL_COMMUNITY)
Admission: EM | Admit: 2020-12-27 | Discharge: 2020-12-27 | Disposition: A | Discharge status: Home / Self Care | Payer: Medicare Other | Attending: Emergency Medicine | Admitting: Emergency Medicine

## 2020-12-27 ENCOUNTER — Other Ambulatory Visit: Payer: Self-pay

## 2020-12-27 ENCOUNTER — Ambulatory Visit (HOSPITAL_COMMUNITY): Payer: Medicare Other

## 2020-12-27 DIAGNOSIS — M25562 Pain in left knee: Secondary | ICD-10-CM

## 2020-12-27 MED ORDER — HYDROCODONE-ACETAMINOPHEN 5-325 MG PO TABS: 1.0000 | ORAL_TABLET | Freq: Four times a day (QID) | ORAL | 0 refills | Status: DC | PRN

## 2020-12-27 MED ORDER — DICLOFENAC SODIUM 1 % EX GEL: 1.0000 "application " | Freq: Four times a day (QID) | CUTANEOUS | 0 refills | Status: DC

## 2020-12-27 NOTE — Discharge Instructions (Addendum)
Your x-ray was negative for fracture, dislocation or for fluid in your knee.  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  Take a Tylenol-containing product 3-4 times a day.  Either 1000 g of Tylenol for mild to moderate pain or 1-2 Norco for severe pain.  Take the lowest effective dose of Norco.  Ice, continue Cane, Voltaren topical gel.  This is over-the-counter, but sometimes the co-pays cheaper than buying it over-the-counter.  Follow-up with Dr. Eulah Pont, orthopedist on call for further evaluation.

## 2020-12-27 NOTE — ED Triage Notes (Addendum)
Eft knee pain, started last night.  Denies fall, denies any known injury. Pain medial knee .  Patient thinks she had a procedure on this new years ago.  Pain shoots up thigh and then down lower leg with bending.

## 2020-12-27 NOTE — ED Provider Notes (Signed)
HPI  SUBJECTIVE:  Krista Benjamin is a 70 y.o. female who presents with left medial knee pain starting last night.  She states that she was walking when the symptoms started.  States that she felt something "drop" followed by pain.  She states the pain radiates up her thigh and down to her toes.  She describes it as burning, needlelike, sharp, present with movement.  She denies bruising, swelling, erythema, soft tissue defect.  She reports a sensation of instability secondary to pain, her knee has not given way.  She has had symptoms like this before when she damaged her meniscus.  No change in her physical activity.  No history of trauma or unexpected torque to the knee.  She has been on Augmentin for dental infection recently.  No recent fluoroquinolone use.  She has tried a TENS unit, silicone cups, and extra dose of her "water pill", Tylenol 1000 mg, and is using 2 canes.  Symptoms are better with rest, symptoms are worse with placing torque on the knee, weightbearing, holding in her knee in the same position for too long.  She denies pain or injury to her hip or ankle.  She took Tylenol 2 and half hours prior to evaluation.  She has a past medical history of diabetes, chronic kidney disease stage II, BMI over 30, hypertension, hypercholesterolemia, aortic stenosis, CVA, meniscus removal, cannot remember which knee.  No history of gout.  PMD:Fry, Tera Mater, MD   Past Medical History:  Diagnosis Date  . Anemia   . Aortic stenosis    Mild  . Arthritis   . Cataract    both eyes  (cataracts removed)  . Diabetes mellitus    sees Dr. Elvera Lennox   . Gynecological examination    sees Dr. Jennette Kettle   . Hyperlipidemia   . Hypertension    sees Dr. Angelina Sheriff  . Stroke [facility])    TIA    Past Surgical History:  Procedure Laterality Date  . CARPAL TUNNEL RELEASE  01/22/14   Left Hand   . CATARACT EXTRACTION     Lens surgery  . COLONOSCOPY  02/06/2018   per Dr. Myrtie Neither, no polyps, repeat in 5 yrs  (colon CA in mother)   . excision of benign cyst     from left maxilla and left maxillary sinus  . KNEE SURGERY    . RIGHT/LEFT HEART CATH AND CORONARY ANGIOGRAPHY N/A 09/14/2018   Procedure: RIGHT/LEFT HEART CATH AND CORONARY ANGIOGRAPHY;  Surgeon: Swaziland, Peter M, MD;  Location: [facility] INVASIVE CV LAB;  Service: Cardiovascular;  Laterality: N/A;  . SHOULDER ARTHROSCOPY W/ ROTATOR CUFF REPAIR Left 09-23-13   per Dr. Delrae Sawyers at [facility] center in [place]     Family History  Problem Relation Age of Onset  . Coronary artery disease Brother   . Coronary artery disease Mother   . Colon cancer Mother   . Cancer Other        fhx  . Diabetes Other        fhx  . Hyperlipidemia Other        fhx  . Hypertension Other        fhx  . Stroke Other        fhx  . Colon polyps Neg Hx   . Esophageal cancer Neg Hx   . Rectal cancer Neg Hx   . Stomach cancer Neg Hx     Social History   Tobacco Use  . Smoking status: Never Smoker  .  Smokeless tobacco: Never Used  Vaping Use  . Vaping Use: Never used  Substance Use Topics  . Alcohol use: No    Alcohol/week: 0.0 standard drinks  . Drug use: No     Current Facility-Administered Medications:  .  0.9 %  sodium chloride infusion, 500 mL, Intravenous, Once, Danis, Starr Lake III, MD  Current Outpatient Medications:  .  aspirin EC 81 MG tablet, Take 81 mg by mouth at bedtime. Swallow whole., Disp: , Rfl:  .  atorvastatin (LIPITOR) 40 MG tablet, TAKE 1 TABLET AT BEDTIME (Patient taking differently: Take 40 mg by mouth at bedtime.), Disp: 90 tablet, Rfl: 3 .  bumetanide (BUMEX) 1 MG tablet, TAKE 2 TABLETS AT BEDTIME, Disp: 180 tablet, Rfl: 0 .  diclofenac Sodium (VOLTAREN) 1 % GEL, Apply 1 application topically 4 (four) times daily., Disp: 100 g, Rfl: 0 .  ferrous sulfate 325 (65 FE) MG EC tablet, Take 1 tablet (325 mg total) by mouth daily., Disp: 90 tablet, Rfl: 3 .  Fexofenadine HCl (ALLEGRA PO), Take 1 tablet by mouth daily at 12  noon., Disp: , Rfl:  .  HYDROcodone-acetaminophen (NORCO/VICODIN) 5-325 MG tablet, Take 1-2 tablets by mouth every 6 (six) hours as needed for moderate pain or severe pain., Disp: 12 tablet, Rfl: 0 .  isosorbide mononitrate (IMDUR) 120 MG 24 hr tablet, Take 1 tablet (120 mg total) by mouth daily. (Patient taking differently: Take 120 mg by mouth daily at 12 noon.), Disp: 15 tablet, Rfl: 0 .  metFORMIN (GLUCOPHAGE-XR) 500 MG 24 hr tablet, TAKE 2 TABLETS TWICE A DAY WITH MEALS, Disp: 360 tablet, Rfl: 3 .  metoprolol succinate (TOPROL-XL) 100 MG 24 hr tablet, Take 1 tablet (100 mg total) by mouth daily. Take with or immediately following a meal., Disp: 90 tablet, Rfl: 3 .  sacubitril-valsartan (ENTRESTO) 97-103 MG, Take 1 tablet by mouth 2 (two) times daily., Disp: 180 tablet, Rfl: 3 .  sitaGLIPtin (JANUVIA) 100 MG tablet, Take 1 tablet (100 mg total) by mouth daily., Disp: 14 tablet, Rfl: 0 .  albuterol (PROVENTIL) (2.5 MG/3ML) 0.083% nebulizer solution, Take 3 mLs (2.5 mg total) by nebulization every 4 (four) hours as needed for wheezing or shortness of breath., Disp: 120 mL, Rfl: 3 .  albuterol (VENTOLIN HFA) 108 (90 Base) MCG/ACT inhaler, USE 2 INHALATIONS EVERY 4 HOURS AS NEEDED FOR WHEEZING (Patient taking differently: Inhale 2 puffs into the lungs every 4 (four) hours as needed for wheezing.), Disp: 54 g, Rfl: 3 .  Alcohol Swabs PADS, Use to check blood sugar once a day.  DX E11.319, Disp: 200 each, Rfl: 3 .  cholecalciferol (VITAMIN D) 1000 UNITS tablet, Take 2,000 Units by mouth at bedtime., Disp: , Rfl:  .  EPIPEN 2-PAK 0.3 MG/0.3ML SOAJ injection, INJECT 0.3 MLS (0.3 MG TOTAL) INTO THE MUSCLE ONCE., Disp: 1 Device, Rfl: 0 .  mupirocin ointment (BACTROBAN) 2 %, Place 1 application into the nose 2 (two) times daily. (Patient taking differently: Apply 1 application topically 2 (two) times daily as needed (wound care).), Disp: 22 g, Rfl: 1 .  ONETOUCH DELICA LANCETS 33G MISC, Use to check blood  sugar twice a day.  DX  E11.319, Disp: 200 each, Rfl: 1 .  polyvinyl alcohol (LIQUIFILM TEARS) 1.4 % ophthalmic solution, Place 1 drop into both eyes daily as needed for dry eyes., Disp: , Rfl:  .  TRUE METRIX BLOOD GLUCOSE TEST test strip, USE AS DIRECTED TWICE A DAY, Disp: 200 strip, Rfl: 12  Allergies  Allergen Reactions  . Bee Venom Anaphylaxis     ROS  As noted in HPI.   Physical Exam  BP (!) 166/71 (BP Location: Left Arm) Comment (BP Location): large cuff.  has not taken medicine today  Pulse 71   Temp 97.9 F (36.6 C) (Oral)   Resp (!) 21   SpO2 100%   Constitutional: Well developed, well nourished, no acute distress Eyes:  EOMI, conjunctiva normal bilaterally HENT: Normocephalic, atraumatic,mucus membranes moist Respiratory: Normal inspiratory effort Cardiovascular: Normal rate GI: nondistended skin: No rash, skin intact Musculoskeletal:   L Knee ROM decreased due to pain, Flexion  intact  Patella NT, Patellar tendon NT, Medial joint tender, Lateral joint NT , Popliteal region NT, Varus MCL stress testing stable but very painful, Valgus LCL stress testing stable, McMurray's testing abnormal, Lachman's negative. Distal NVI with intact baseline sensation / motor / pulse distal to knee.  No appreciable effusion. No erythema. No increased temperature. No crepitus.  Patient with difficulty bearing weight due to pain. Neurologic: Alert & oriented x 3, no focal neuro deficits Psychiatric: Speech and behavior appropriate   ED Course   Medications - No data to display  Orders Placed This Encounter  Procedures  . DG Knee Complete 4 Views Left    Standing Status:   Standing    Number of Occurrences:   1    Order Specific Question:   Reason for Exam (SYMPTOM  OR DIAGNOSIS REQUIRED)    Answer:   Medial knee pain rule out effusion, fracture  . Apply ace wrap    Standing Status:   Standing    Number of Occurrences:   1    No results found for this or any previous visit  (from the past 24 hour(s)). DG Knee Complete 4 Views Left  Result Date: 12/27/2020 CLINICAL DATA:  Onset right knee pain last night.  No known injury. EXAM: LEFT KNEE - COMPLETE 4+ VIEW COMPARISON:  None. FINDINGS: There is no acute bony or joint abnormality. Small osteophytes are seen along the medial joint line and there is a small subchondral cyst in the superior pole of the patella. No joint effusion. No chondrocalcinosis or focal lesion. 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 Otherwise negative. Electronically Signed   By: Drusilla Kanner M.D.   On: 12/27/2020 13:50    ED Clinical Impression  1. Acute pain of left knee      ED Assessment/Plan  Will image need to rule out any acute changes.  She is tender along the medial knee and has pain with stressing the meniscus.  Will place in an Ace wrap.  Do not have a knee immobilizer that will appropriately fit.  Patient took 1000 mg of Tylenol 2 and half hours prior to evaluation.  I am hesitant to give her NSAIDs due to chronic kidney disease stage II.  No evidence of septic joint.  No appreciable effusion.  Her knee is otherwise stable.  [place] Narcotic database reviewed for this patient, and feel that the risk/benefit ratio today is favorable for proceeding with a prescription for controlled substance.  Had 8 oxycodone prescribed on 3/3 for postsurgical pain.  Care everywhere labs reviewed.  BUN/creatinine 29/1.07 on 12/09/2020 calculated creatinine clearance 90 mL/min.  Reviewed imaging independently. No Fracture, dislocation, effusion. mild patellofemoral medial compartment osteoarthritis. see radiology report for full details.  Suspect ligamentous or meniscal injury.  Could also be a flare of medial compartment osteoarthritis as seen on x-ray.  We  will send home with Tylenol-containing product 3-4 times a day.  Either 1000 g of Tylenol for mild to moderate pain or 1-2 Norco for severe pain.  Ice, continue  Cane, Voltaren topical gel.  Will refer to Dr. Eulah Pont, orthopedist on-call for further evaluation.  Discussed imaging, MDM, treatment plan, and plan for follow-up with patient. patient agrees with plan.   Meds ordered this encounter  Medications  . HYDROcodone-acetaminophen (NORCO/VICODIN) 5-325 MG tablet    Sig: Take 1-2 tablets by mouth every 6 (six) hours as needed for moderate pain or severe pain.    Dispense:  12 tablet    Refill:  0  . diclofenac Sodium (VOLTAREN) 1 % GEL    Sig: Apply 1 application topically 4 (four) times daily.    Dispense:  100 g    Refill:  0    *This clinic note was created using Scientist, clinical (histocompatibility and immunogenetics). Therefore, there may be occasional mistakes despite careful proofreading.   ?    Domenick Gong, MD 12/28/20 [phone]

## 2021-01-07 ENCOUNTER — Other Ambulatory Visit: Payer: Self-pay | Admitting: Internal Medicine

## 2021-01-16 ENCOUNTER — Other Ambulatory Visit: Payer: Self-pay | Admitting: Internal Medicine

## 2021-02-03 ENCOUNTER — Telehealth: Payer: Self-pay | Admitting: Cardiology

## 2021-02-03 MED ORDER — ENTRESTO 97-103 MG PO TABS: 1.0000 | ORAL_TABLET | Freq: Two times a day (BID) | ORAL | 1 refills | Status: DC

## 2021-02-03 NOTE — Telephone Encounter (Signed)
[facility] Will forward message to Dr [employer] nurse to address pt assistance paperwork/cy

## 2021-02-03 NOTE — Telephone Encounter (Signed)
Pt c/o medication issue:  1. Name of Medication: sacubitril-valsartan (ENTRESTO) 97-103 MG  2. How are you currently taking this medication (dosage and times per day)? 1 tablet twice a day  3. Are you having a reaction (difficulty breathing--STAT)? no  4. What is your medication issue? Patient states [employer] needs the prescription for the medication so she can continue to get it for free. She states she only has 2 pills left and [facility] is giving her an emergency refill, but she also needs at least a 5 day supply to hold her over until it arrives. She [facility] [address]

## 2021-02-04 MED ORDER — ENTRESTO 97-103 MG PO TABS: 1.0000 | ORAL_TABLET | Freq: Two times a day (BID) | ORAL | 3 refills | Status: DC

## 2021-02-04 NOTE — Telephone Encounter (Signed)
Rx sent to [facility] in [place] as requested Advised patient

## 2021-02-10 ENCOUNTER — Other Ambulatory Visit: Payer: Self-pay | Admitting: Family Medicine

## 2021-02-10 ENCOUNTER — Other Ambulatory Visit: Payer: Self-pay | Admitting: Cardiology

## 2021-02-11 NOTE — Telephone Encounter (Signed)
Rx(s) sent to pharmacy electronically.  

## 2021-02-25 ENCOUNTER — Other Ambulatory Visit: Payer: Self-pay | Admitting: Internal Medicine

## 2021-02-25 ENCOUNTER — Encounter: Payer: Self-pay | Admitting: Internal Medicine

## 2021-02-25 MED ORDER — TRUE METRIX AIR GLUCOSE METER DEVI: 0 refills | Status: DC

## 2021-03-04 ENCOUNTER — Other Ambulatory Visit: Payer: Self-pay | Admitting: Internal Medicine

## 2021-03-09 ENCOUNTER — Encounter: Payer: Self-pay | Admitting: Family Medicine

## 2021-03-09 ENCOUNTER — Other Ambulatory Visit: Payer: Self-pay

## 2021-03-09 ENCOUNTER — Telehealth: Payer: Self-pay | Admitting: Internal Medicine

## 2021-03-09 ENCOUNTER — Ambulatory Visit (INDEPENDENT_AMBULATORY_CARE_PROVIDER_SITE_OTHER): Payer: Medicare Other | Admitting: Family Medicine

## 2021-03-09 ENCOUNTER — Telehealth: Payer: Self-pay

## 2021-03-09 VITALS — BP 128/70 | HR 88 | Temp 98.4°F | Ht 64.5 in | Wt 248.0 lb

## 2021-03-09 DIAGNOSIS — D508 Other iron deficiency anemias: Secondary | ICD-10-CM

## 2021-03-09 DIAGNOSIS — N182 Chronic kidney disease, stage 2 (mild): Secondary | ICD-10-CM

## 2021-03-09 DIAGNOSIS — E782 Mixed hyperlipidemia: Secondary | ICD-10-CM

## 2021-03-09 DIAGNOSIS — I420 Dilated cardiomyopathy: Secondary | ICD-10-CM

## 2021-03-09 DIAGNOSIS — E1122 Type 2 diabetes mellitus with diabetic chronic kidney disease: Secondary | ICD-10-CM | POA: Diagnosis not present

## 2021-03-09 DIAGNOSIS — E11319 Type 2 diabetes mellitus with unspecified diabetic retinopathy without macular edema: Secondary | ICD-10-CM

## 2021-03-09 DIAGNOSIS — I10 Essential (primary) hypertension: Secondary | ICD-10-CM | POA: Diagnosis not present

## 2021-03-09 DIAGNOSIS — IMO0002 Reserved for concepts with insufficient information to code with codable children: Secondary | ICD-10-CM

## 2021-03-09 LAB — LIPID PANEL
Cholesterol: 151 mg/dL (ref 0–200)
HDL: 48.8 mg/dL (ref >39.00)
LDL Cholesterol: 81 mg/dL (ref 0–99)
NonHDL: 102.33
Total CHOL/HDL Ratio: 3
Triglycerides: 107 mg/dL (ref 0.0–149.0)
VLDL: 21.4 mg/dL (ref 0.0–40.0)

## 2021-03-09 LAB — CBC WITH DIFFERENTIAL/PLATELET
Basophils Absolute: 0 10*3/uL (ref 0.0–0.1)
Basophils Relative: 0.4 % (ref 0.0–3.0)
Eosinophils Absolute: 0 10*3/uL (ref 0.0–0.7)
Eosinophils Relative: 0.5 % (ref 0.0–5.0)
HCT: 33.3 % — ABNORMAL LOW (ref 36.0–46.0)
Hemoglobin: 11 g/dL — ABNORMAL LOW (ref 12.0–15.0)
Lymphocytes Relative: 29.8 % (ref 12.0–46.0)
Lymphs Abs: 2.6 10*3/uL (ref 0.7–4.0)
MCHC: 32.9 g/dL (ref 30.0–36.0)
MCV: 78.6 fl (ref 78.0–100.0)
Monocytes Absolute: 0.6 10*3/uL (ref 0.1–1.0)
Monocytes Relative: 6.8 % (ref 3.0–12.0)
Neutro Abs: 5.5 10*3/uL (ref 1.4–7.7)
Neutrophils Relative %: 62.5 % (ref 43.0–77.0)
Platelets: 248 10*3/uL (ref 150.0–400.0)
RBC: 4.24 Mil/uL (ref 3.87–5.11)
RDW: 14.2 % (ref 11.5–15.5)
WBC: 8.7 10*3/uL (ref 4.0–10.5)

## 2021-03-09 LAB — HEPATIC FUNCTION PANEL
ALT: 10 U/L (ref 0–35)
AST: 13 U/L (ref 0–37)
Albumin: 4 g/dL (ref 3.5–5.2)
Alkaline Phosphatase: 59 U/L (ref 39–117)
Bilirubin, Direct: 0.1 mg/dL (ref 0.0–0.3)
Total Bilirubin: 0.5 mg/dL (ref 0.2–1.2)
Total Protein: 6.8 g/dL (ref 6.0–8.3)

## 2021-03-09 LAB — BASIC METABOLIC PANEL
BUN: 18 mg/dL (ref 6–23)
CO2: 32 mEq/L (ref 19–32)
Calcium: 9.5 mg/dL (ref 8.4–10.5)
Chloride: 100 mEq/L (ref 96–112)
Creatinine, Ser: 0.93 mg/dL (ref 0.40–1.20)
GFR: 62.11 mL/min (ref >60.00)
Glucose, Bld: 158 mg/dL — ABNORMAL HIGH (ref 70–99)
Potassium: 3.9 mEq/L (ref 3.5–5.1)
Sodium: 142 mEq/L (ref 135–145)

## 2021-03-09 LAB — T3, FREE: T3, Free: 2.9 pg/mL (ref 2.3–4.2)

## 2021-03-09 LAB — TSH: TSH: 1.1 u[IU]/mL (ref 0.35–4.50)

## 2021-03-09 LAB — T4, FREE: Free T4: 0.98 ng/dL (ref 0.60–1.60)

## 2021-03-09 MED ORDER — TRUEPLUS LANCETS 33G MISC: 3 refills | Status: AC

## 2021-03-09 MED ORDER — TRUE METRIX GO GLUCOSE METER W/DEVICE KIT: PACK | 0 refills | Status: AC

## 2021-03-09 MED ORDER — TRUE METRIX BLOOD GLUCOSE TEST VI STRP: ORAL_STRIP | 3 refills | Status: DC

## 2021-03-09 NOTE — Progress Notes (Signed)
Subjective:    Patient ID: Krista Benjamin, female    DOB: [birth year], 70 y.o.   MRN: [id number]  HPI Here to follow up on issues. She is doing well overall but she has a lot of pain in the left knee due to a torn meniscus. She is scheduled for arthroscopic surgery next week. She sees Dr. Elvera Lennox regularly for the diabetes. Her BP is stable. She had a mammogram in February.   Review of Systems  Constitutional: Negative.   HENT: Negative.   Eyes: Negative.   Respiratory: Negative.   Cardiovascular: Negative.   Gastrointestinal: Negative.   Genitourinary: Negative for decreased urine volume, difficulty urinating, dyspareunia, dysuria, enuresis, flank pain, frequency, hematuria, pelvic pain and urgency.  Musculoskeletal: Positive for arthralgias.  Skin: Negative.   Neurological: Negative.   Psychiatric/Behavioral: Negative.        Objective:   Physical Exam Constitutional:      General: She is not in acute distress.    Appearance: She is well-developed. She is obese.     Comments: She is limping. Uses a walker   HENT:     Head: Normocephalic and atraumatic.     Right Ear: External ear normal.     Left Ear: External ear normal.     Nose: Nose normal.     Mouth/Throat:     Pharynx: No oropharyngeal exudate.  Eyes:     General: No scleral icterus.    Conjunctiva/sclera: Conjunctivae normal.     Pupils: Pupils are equal, round, and reactive to light.  Neck:     Thyroid: No thyromegaly.     Vascular: No JVD.  Cardiovascular:     Rate and Rhythm: Normal rate and regular rhythm.     Heart sounds: Normal heart sounds. No murmur heard. No friction rub. No gallop.   Pulmonary:     Effort: Pulmonary effort is normal. No respiratory distress.     Breath sounds: Normal breath sounds. No wheezing or rales.  Chest:     Chest wall: No tenderness.  Abdominal:     General: Bowel sounds are normal. There is no distension.     Palpations: Abdomen is soft. There is no mass.      Tenderness: There is no abdominal tenderness. There is no guarding or rebound.  Musculoskeletal:        General: No tenderness. Normal range of motion.     Cervical back: Normal range of motion and neck supple.  Lymphadenopathy:     Cervical: No cervical adenopathy.  Skin:    General: Skin is warm and dry.     Findings: No erythema or rash.  Neurological:     Mental Status: She is alert and oriented to person, place, and time.     Cranial Nerves: No cranial nerve deficit.     Motor: No abnormal muscle tone.     Coordination: Coordination normal.     Deep Tendon Reflexes: Reflexes are normal and symmetric. Reflexes normal.  Psychiatric:        Behavior: Behavior normal.        Thought Content: Thought content normal.        Judgment: Judgment normal.           Assessment & Plan:  She has left knee pain and she is scheduled for surgery next week. Her diabetes and HTN and CKD seem to be stable. We will get fasting labs today for lipids, etc. We spent 40 minutes discussing this issues today.  Alysia Penna, MD

## 2021-03-09 NOTE — Telephone Encounter (Signed)
   [facility] Pre-operative Risk Assessment    Patient Name: Krista Benjamin  DOB: 07/19/1951  MRN: [id number]   HEARTCARE STAFF: - Please ensure there is not already an duplicate clearance open for this procedure.   Request for surgical clearance:  1. What type of surgery is being performed Derangement of Medial Meniscus Left Knee   2. When is this surgery scheduled 03/18/2021  3. What type of clearance is required  Medical and Pharmacy  4. Are there any medications that need to be held prior to surgery and how long  Aspirin  5. Practice name and name of physician performing surgery [facility]      6. What is the office phone number [phone]   7.   What is the office fax number [phone]  Attention Basye  8.   Anesthesia type Not listed   Neoma Laming 03/09/2021, 12:20 PM  _________________________________________________________________   (provider comments below)

## 2021-03-09 NOTE — Telephone Encounter (Signed)
Called and spoke with pharmacy who advised lancets and test strips needed to be sent with kit. Rx sent.

## 2021-03-09 NOTE — Telephone Encounter (Signed)
New message   Patient voice the current prescription was written incorrectly / asking for a call back to discuss further   1. Which medications need to be refilled? (please list name of each medication and dose if known) Blood Glucose Monitoring Suppl (TRUE METRIX GO GLUCOSE METER) w/Device KIT  Needles & strips   2. Which pharmacy/location (including street and city if local pharmacy) is medication to be sent to?[id number] [place] [place] [facility] [address]

## 2021-03-09 NOTE — Telephone Encounter (Signed)
   Name: Krista Benjamin  DOB: May 11, 1951  MRN: [id number]   Primary Cardiologist: Rollene Rotunda, MD  Chart reviewed as part of pre-operative protocol coverage. Patient was contacted 03/09/2021 in reference to pre-operative risk assessment for pending surgery as outlined below.  Krista Benjamin was last seen on 10/16/20 by Dr. Antoine Poche.  Since that day, Krista Benjamin has done fine from a cardiac standpoint. Over the past month or so she has been quite limited in activity by knee pain, ambulating with a walker when necessary. She is still able to go up a flight of stairs in her current state and denies chest pain or SOB with that activity. Prior to her knee pain worsening she was able to complete 4 METs without anginal complaints.  Therefore, based on ACC/AHA guidelines, the patient would be at acceptable risk for the planned procedure without further cardiovascular testing.   The patient was advised that if she develops new symptoms prior to surgery to contact our office to arrange for a follow-up visit, and she verbalized understanding.  She has no prior history of CAD, therefore if needed, patient can hold aspiring 7 days prior to her upcoming surgery with plans to restart when cleared to do so by her surgeon.   I will route this recommendation to the requesting party via Epic fax function and remove from pre-op pool. Please call with questions.  Beatriz Stallion, PA-C 03/09/2021, 3:09 PM

## 2021-03-09 NOTE — Addendum Note (Signed)
Addended by: Bonnye Fava on: 03/09/2021 11:31 AM   Modules accepted: Orders

## 2021-03-10 ENCOUNTER — Other Ambulatory Visit: Payer: Self-pay | Admitting: Internal Medicine

## 2021-03-10 ENCOUNTER — Telehealth: Payer: Self-pay | Admitting: Internal Medicine

## 2021-03-10 DIAGNOSIS — IMO0002 Reserved for concepts with insufficient information to code with codable children: Secondary | ICD-10-CM

## 2021-03-10 DIAGNOSIS — E11319 Type 2 diabetes mellitus with unspecified diabetic retinopathy without macular edema: Secondary | ICD-10-CM

## 2021-03-10 NOTE — Telephone Encounter (Signed)
F/u   The patient is asking for a call back to discuss DM / insurance information

## 2021-03-12 ENCOUNTER — Encounter: Payer: Medicare Other | Admitting: Family Medicine

## 2021-03-17 ENCOUNTER — Other Ambulatory Visit: Payer: Self-pay | Admitting: Internal Medicine

## 2021-03-18 ENCOUNTER — Ambulatory Visit: Payer: Medicare Other | Admitting: Internal Medicine

## 2021-03-26 ENCOUNTER — Other Ambulatory Visit: Payer: Self-pay | Admitting: Family Medicine

## 2021-03-29 LAB — HM DIABETES EYE EXAM

## 2021-03-31 ENCOUNTER — Other Ambulatory Visit: Payer: Self-pay

## 2021-03-31 DIAGNOSIS — Z79899 Other long term (current) drug therapy: Secondary | ICD-10-CM

## 2021-03-31 LAB — BASIC METABOLIC PANEL
BUN/Creatinine Ratio: 21 (ref 12–28)
BUN: 21 mg/dL (ref 8–27)
CO2: 22 mmol/L (ref 20–29)
Calcium: 9.6 mg/dL (ref 8.7–10.3)
Chloride: 100 mmol/L (ref 96–106)
Creatinine, Ser: 1.01 mg/dL — ABNORMAL HIGH (ref 0.57–1.00)
Glucose: 137 mg/dL — ABNORMAL HIGH (ref 65–99)
Potassium: 4.9 mmol/L (ref 3.5–5.2)
Sodium: 139 mmol/L (ref 134–144)
eGFR: 60 mL/min/{1.73_m2} (ref >59)

## 2021-04-01 NOTE — Progress Notes (Signed)
Stable renal function and electrolyte

## 2021-04-06 ENCOUNTER — Other Ambulatory Visit: Payer: Self-pay | Admitting: Cardiology

## 2021-04-09 ENCOUNTER — Telehealth: Payer: Self-pay | Admitting: Family Medicine

## 2021-04-09 ENCOUNTER — Other Ambulatory Visit: Payer: Self-pay | Admitting: Family Medicine

## 2021-04-09 NOTE — Telephone Encounter (Signed)
ketoconazole (NIZORAL) 2 % cream   [id number] [place] [place] - [address] Phone:  [phone]  Fax:  [phone]      Patient thinks that she is getting another yeast infection after coming off an antibiotic that the hospital prescribed her.

## 2021-04-09 NOTE — Telephone Encounter (Signed)
Pt is calling to check the status and was not told that the provider is out of the office and will not be back until 04/14/2021.  Pt stated that she needs it before the weekend and asked if we can get another provider Kandee Keen) to assist with this.  Pt would like to have a call back when it is called in.

## 2021-04-09 NOTE — Telephone Encounter (Signed)
Pt is calling in to check the status of the below msg and stated that it was not communicated this morning that the provider is out of the office and that we ask for 3-5 business days for any forms to be completed.  Pt is aware that the provider will be out of the office until Wednesday 04/14/2021.

## 2021-04-09 NOTE — Telephone Encounter (Signed)
Patient requested to have a disability placard form completed  Call the patient to pick up form at [phone]  Disposition: Dr's Folder

## 2021-04-09 NOTE — Telephone Encounter (Signed)
Noted  

## 2021-04-09 NOTE — Telephone Encounter (Signed)
Pt was advised that Dr.Fry is not here to approve sending the cream in. Pt advised to use OTC yeast infection cream. Pt declined and stated she will go to [facility].

## 2021-04-09 NOTE — Telephone Encounter (Signed)
Pt is aware that Dr Clent Ridges is out of the office, pt will be notified when the form is ready for pick up

## 2021-04-13 NOTE — Telephone Encounter (Signed)
Last office visit- 02/09/21 Medication currently not on med list. Can this patient receive a refill?  No future office visit scheduled

## 2021-04-19 NOTE — Telephone Encounter (Signed)
Spoke with pt confirmed that she picked up Placard form from the office.

## 2021-04-30 ENCOUNTER — Encounter: Payer: Self-pay | Admitting: Family Medicine

## 2021-04-30 ENCOUNTER — Other Ambulatory Visit: Payer: Self-pay

## 2021-04-30 ENCOUNTER — Ambulatory Visit: Payer: Medicare Other | Admitting: Family Medicine

## 2021-04-30 VITALS — BP 160/78 | HR 85 | Temp 97.7°F | Ht 64.5 in | Wt 248.0 lb

## 2021-04-30 DIAGNOSIS — J069 Acute upper respiratory infection, unspecified: Secondary | ICD-10-CM | POA: Diagnosis not present

## 2021-04-30 MED ORDER — AZITHROMYCIN 250 MG PO TABS: ORAL_TABLET | ORAL | 0 refills | Status: DC

## 2021-04-30 MED ORDER — HYDROCODONE BIT-HOMATROP MBR 5-1.5 MG/5ML PO SOLN: 5.0000 mL | ORAL | 0 refills | Status: DC | PRN

## 2021-04-30 NOTE — Addendum Note (Signed)
Addended by: Christy Sartorius on: 04/30/2021 04:23 PM   Modules accepted: Orders

## 2021-04-30 NOTE — Progress Notes (Signed)
   Subjective:    Patient ID: Krista Benjamin, female    DOB: Feb 17, 1951, 70 y.o.   MRN: [id number]  HPI Here for the onset this morning of a ST an a dry cough. No fever or chest pain or SOB. No body aches or NVD. Drinking fluids. No one else around her has been sick to her knowledge.    Review of Systems  Constitutional: Negative.   HENT:  Positive for postnasal drip and sore throat. Negative for congestion, ear pain and sinus pain.   Eyes: Negative.   Respiratory:  Positive for cough. Negative for shortness of breath and wheezing.   Cardiovascular: Negative.   Gastrointestinal: Negative.       Objective:   Physical Exam Constitutional:      Appearance: She is well-developed. She is not ill-appearing.  HENT:     Right Ear: Tympanic membrane and ear canal normal.     Left Ear: Tympanic membrane and ear canal normal.     Nose: No congestion.     Mouth/Throat:     Pharynx: Oropharynx is clear.     Tonsils: No tonsillar exudate.  Cardiovascular:     Rate and Rhythm: Normal rate and regular rhythm.     Heart sounds: Normal heart sounds.  Pulmonary:     Effort: Pulmonary effort is normal.     Breath sounds: Normal breath sounds.  Lymphadenopathy:     Cervical: No cervical adenopathy.  Neurological:     Mental Status: She is alert.          Assessment & Plan:  URI, treat with a Zpack. We will also test her for Covid-19, flu and RSV. She will quarantine at home for 5 days.  Gershon Crane, MD

## 2021-05-03 ENCOUNTER — Other Ambulatory Visit: Payer: Self-pay | Admitting: Family Medicine

## 2021-05-03 ENCOUNTER — Telehealth: Payer: Self-pay | Admitting: Family Medicine

## 2021-05-03 NOTE — Telephone Encounter (Signed)
Pt is calling back to see if her COVID results have come back she is really wanting to know what she should do if she does not get a call back today.  Pt is aware that we are waiting on the results ourselves and once we get them they will give her a call back.  Pt is aware that it will take 3-5 business days per Enrique Sack and someone will give her a call once they are in.

## 2021-05-05 ENCOUNTER — Encounter: Payer: Self-pay | Admitting: Family Medicine

## 2021-05-05 NOTE — Telephone Encounter (Signed)
Patient sent a message via My Chart inquiring about COVID results.   Test results aren't back, patient aware.

## 2021-05-05 NOTE — Telephone Encounter (Signed)
I agree, the results are not back yet. We will have to wait a bit longer

## 2021-05-06 LAB — COVID-19, FLU A+B AND RSV
Influenza A, NAA: NOT DETECTED
Influenza B, NAA: NOT DETECTED
RSV, NAA: NOT DETECTED
SARS-CoV-2, NAA: DETECTED — AB

## 2021-05-19 ENCOUNTER — Telehealth: Payer: Self-pay | Admitting: Family Medicine

## 2021-05-19 NOTE — Telephone Encounter (Signed)
Set up an OV today

## 2021-05-19 NOTE — Telephone Encounter (Signed)
Dreanna from [employer] called to advise that the PT BP is 190/100. Docia Furl can be reacheda t [phone] for further questions.

## 2021-05-19 NOTE — Telephone Encounter (Signed)
Called patient 2x's unable to leave message mailbox was full.   Called Dreanna she stated that she will not see patient anymore, only for a yearly visit.   Will send patient My Chart message.

## 2021-05-19 NOTE — Telephone Encounter (Signed)
Last ov- 04/30/21.   Please advise

## 2021-06-11 ENCOUNTER — Telehealth: Payer: Self-pay | Admitting: Family Medicine

## 2021-06-11 NOTE — Telephone Encounter (Signed)
Left message for patient to call back and schedule Medicare Annual Wellness Visit (AWV) either virtually or in office. Left  my Krista Benjamin number [phone]   Last AWV 07/22/20 please schedule at anytime with [facility] Nurse Health Advisor 1 or 2   This should be a 45 minute visit.   [facility] medicare AWV can be calendar year

## 2021-07-14 ENCOUNTER — Ambulatory Visit (INDEPENDENT_AMBULATORY_CARE_PROVIDER_SITE_OTHER): Payer: Medicare Other | Admitting: *Deleted

## 2021-07-14 ENCOUNTER — Other Ambulatory Visit: Payer: Self-pay

## 2021-07-14 DIAGNOSIS — Z23 Encounter for immunization: Secondary | ICD-10-CM

## 2021-07-19 ENCOUNTER — Encounter: Payer: Self-pay | Admitting: Internal Medicine

## 2021-07-19 ENCOUNTER — Ambulatory Visit (INDEPENDENT_AMBULATORY_CARE_PROVIDER_SITE_OTHER): Payer: Medicare Other | Admitting: Internal Medicine

## 2021-07-19 ENCOUNTER — Other Ambulatory Visit: Payer: Self-pay

## 2021-07-19 VITALS — BP 156/96 | HR 67 | Ht 64.5 in | Wt 248.8 lb

## 2021-07-19 DIAGNOSIS — E782 Mixed hyperlipidemia: Secondary | ICD-10-CM | POA: Diagnosis not present

## 2021-07-19 DIAGNOSIS — E669 Obesity, unspecified: Secondary | ICD-10-CM

## 2021-07-19 DIAGNOSIS — E1165 Type 2 diabetes mellitus with hyperglycemia: Secondary | ICD-10-CM | POA: Diagnosis not present

## 2021-07-19 LAB — POCT GLYCOSYLATED HEMOGLOBIN (HGB A1C): Hemoglobin A1C: 7.1 % — AB (ref 4.0–5.6)

## 2021-07-19 MED ORDER — GLIMEPIRIDE 2 MG PO TABS: 2.0000 mg | ORAL_TABLET | Freq: Every day | ORAL | 3 refills | Status: DC

## 2021-07-19 MED ORDER — METFORMIN HCL ER 500 MG PO TB24: ORAL_TABLET | ORAL | 3 refills | Status: DC

## 2021-07-19 NOTE — Patient Instructions (Signed)
Please continue: - Metformin ER 1000 mg 2x a day with meals  - Januvia 100 mg before breakfast  Please restart: - Amaryl 2 mg before dinner  Please return in 4 months with your sugar log.

## 2021-07-19 NOTE — Progress Notes (Signed)
Patient ID: Krista Benjamin, female   DOB: 06-09-1951, 70 y.o.   MRN: [id number]  This visit occurred during the SARS-CoV-2 public health emergency.  Safety protocols were in place, including screening questions prior to the visit, additional usage of staff PPE, and extensive cleaning of exam room while observing appropriate contact time as indicated for disinfecting solutions.   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  Last visit 8 months ago. She has [place].   Interim history: Before last visit, she started a "21-day fast": No red meat, sugar, starches.  She felt much better and sugars are improving.  She uses this diet intermittently. Since last visit, she had jaw surgery.  This did not heal very well and will need to go back and see if she needs repeat intervention. She then had knee Sx, then had a fractured tibia >> was in a wheelchair, now in crutches.  She was not able to go back to work.  She has increased urination, o blurry vision, nausea, chest pain. + joint pain - L knee.   Reviewed HbA1c levels: Lab Results  Component Value Date   HGBA1C 6.1 (A) 11/20/2020   HGBA1C 6.5 (A) 07/24/2020   HGBA1C 7.7 (A) 03/17/2020  Prev. 5.9% on 12/2012, previously 6.1%.  Pt is on a regimen of: - Metformin ER 1000 mg 2x a day with meals  - Tradjenta 5 >> Januvia 100 mg before breakfast - per insurance pref. Other medications tried: Rec'd GLP1 agonist >> Victoza covered >> she was afraid of SEs. We tried Jardiance 10 mg daily in am - added 04/2017 >> yeast inf >> stopped We tried Invokana 100 mg in am >> stopped after 3 days >> nausea, diarrhea We stopped Lantus 10 units qhs. We stopped Actos 45 mg (was on it since 07/2001) Metformin IR 1000 mg bid >> stopped b/c Nausea/Diarrhea She was on Novolog 8 units tid ac, when sugars >140  We stopped  Amaryl 4 mg before breakfast in 11/2020.  Pt checks her sugars 1-2 times a day: - am:  62, 67, 80-138, 158, 180, 233 >> 53, 59, 66-113, 156 >> 64-112 >> 104-143, 155, 208 - 2h after b'fast:  135, 139, 193 >> 189 >> 115 >> n/c - lunch: 135-169 >> 183 >> 71, 82 >> n/c >> 156 >> 79 >> n/c - 2h after lunch: 303 >> n/c >> 201 >> 104 >> n/c - dinner: 95-148, 201 >> 171, 178, 195 (steroids) >> n/c >> 62 >> n/c - 2h after dinner: 197, 210, 231  >> 115-173 >> n/c - bedtime:66, 97-197, 215 >> n/c >> 100, 191 (after grapes) >> 81 >>132, 364 (grapes) - night:   76, 137 >> n/c >> 184 >> 125, 128 >> n/c  Lowest sugar:  58 in 07/2019 >> 62 >> 53 >> 104; she has hypoglycemia awareness in the 70s. Highest sugar: 300 (at the time of her fx) >> .Marland Kitchen.233 >> 191 >> 364.  Glucometer: One Touch Verio Flex  She lost 80 pounds since 2012 >> She was able to come off insulin then. She saw Antonieta Iba (nutritionist) on 01/01/2015.  -+ Mild CKD. Latest BUN/creatinine was:  Lab Results  Component Value Date   BUN 21 03/31/2021   Lab Results  Component Value Date   CREATININE 1.01 (H) 03/31/2021  On Entresto.  Lab Results  Component Value Date  MICRALBCREAT 3.4 11/20/2020   MICRALBCREAT 2.3 08/26/2015   MICRALBCREAT 5.5 09/22/2014   MICRALBCREAT 5.4 11/21/2007   -+ HL.  Latest lipid profile: Lab Results  Component Value Date   CHOL 151 03/09/2021   HDL 48.80 03/09/2021   LDLCALC 81 03/09/2021   TRIG 107.0 03/09/2021   CHOLHDL 3 03/09/2021  On Lipitor 40.  - last eye exam: 03/2021: No DR; previous history of background DR; + h/o B cataract sx.  - no numbness and tingling in her legs.  She also has a history of HTN, iron deficiency anemia-on iron therapy, aortic valve disease/murmur, left ventricular hypertrophy per EKG, obesity, multinodular goiter, fibroids.  She had shingles in 11/2014 and 02/2016.  She fell and fractured R wrist 06/2018. She had another, right radial, fracture  01/2019.  ROS: + See HPI  I reviewed pt's medications, allergies, PMH, social hx, family hx, and changes were documented in the history of present illness. Otherwise, unchanged from my initial visit note.  Past Medical History:  Diagnosis Date   Anemia    Aortic stenosis    Mild   Arthritis    Cataract    both eyes  (cataracts removed)   Diabetes mellitus    sees Dr. Cruzita Lederer    Gynecological examination    sees Dr. Nori Riis    Hyperlipidemia    Hypertension    sees Dr. Marijo File   Stroke [facility])    TIA   Past Surgical History:  Procedure Laterality Date   CARPAL TUNNEL RELEASE  01/22/14   Left Hand    CATARACT EXTRACTION     Lens surgery   COLONOSCOPY  02/06/2018   per Dr. Loletha Carrow, no polyps, repeat in 5 yrs (colon CA in mother)    excision of benign cyst     from left maxilla and left maxillary sinus   KNEE SURGERY     RIGHT/LEFT HEART CATH AND CORONARY ANGIOGRAPHY N/A 09/14/2018   Procedure: RIGHT/LEFT HEART CATH AND CORONARY ANGIOGRAPHY;  Surgeon: Martinique, Peter M, MD;  Location: [facility];  Service: Cardiovascular;  Laterality: N/A;   SHOULDER ARTHROSCOPY W/ ROTATOR CUFF REPAIR Left 09-23-13   per Dr. Devonne Doughty at [facility] center in [employer] History   Socioeconomic History   Marital status: Married    Spouse name: Not on file   Number of children: Not on file   Years of education: Not on file   Highest education level: Not on file  Occupational History   Not on file  Tobacco Use   Smoking status: Never   Smokeless tobacco: Never  Vaping Use   Vaping Use: Never used  Substance and Sexual Activity   Alcohol use: No    Alcohol/week: 0.0 standard drinks   Drug use: No   Sexual activity: Not on file  Other Topics Concern   Not on file  Social History Narrative   Not on file   Social Determinants of Health   Financial Resource Strain: Low Risk    Difficulty of Paying Living Expenses: Not hard at all  Food Insecurity: No Food  Insecurity   Worried About Charity fundraiser in the Last Year: Never true   [place] in the Last Year: Never true  Transportation Needs: No Transportation Needs   Lack of Transportation (Medical): No   Lack of Transportation (Non-Medical): No  Physical Activity: Inactive   Days of Exercise per Week: 0 days   Minutes of  Exercise per Session: 0 min  Stress: No Stress Concern Present   Feeling of Stress : Not at all  Social Connections: Socially Integrated   Frequency of Communication with Friends and Family: More than three times a week   Frequency of Social Gatherings with Friends and Family: More than three times a week   Attends Religious Services: More than 4 times per year   Active Member of [employer] or Organizations: Yes   Attends Music therapist: More than 4 times per year   Marital Status: Married  Human resources officer Violence: Not At Risk   Fear of Current or Ex-Partner: No   Emotionally Abused: No   Physically Abused: No   Sexually Abused: No   Current Outpatient Medications on File Prior to Visit  Medication Sig Dispense Refill   albuterol (PROVENTIL) (2.5 MG/3ML) 0.083% nebulizer solution Take 3 mLs (2.5 mg total) by nebulization every 4 (four) hours as needed for wheezing or shortness of breath. 120 mL 3   albuterol (VENTOLIN HFA) 108 (90 Base) MCG/ACT inhaler USE 2 INHALATIONS EVERY 4 HOURS AS NEEDED FOR WHEEZING (Patient taking differently: Inhale 2 puffs into the lungs every 4 (four) hours as needed for wheezing.) 54 g 3   Alcohol Swabs (ALCOHOL PREP) 70 % PADS USE TO CHECK BLOOD SUGAR ONCE DAILY 200 each 3   aspirin EC 81 MG tablet Take 81 mg by mouth at bedtime. Swallow whole.     atorvastatin (LIPITOR) 40 MG tablet TAKE 1 TABLET AT BEDTIME 90 tablet 3   azithromycin (ZITHROMAX Z-PAK) 250 MG tablet As directed 6 each 0   Blood Glucose Monitoring Suppl (TRUE METRIX GO GLUCOSE METER) w/Device KIT Use as instructed to check blood sugar 2 times daily 1 kit  0   bumetanide (BUMEX) 1 MG tablet TAKE 2 TABLETS AT BEDTIME 180 tablet 1   cholecalciferol (VITAMIN D) 1000 UNITS tablet Take 2,000 Units by mouth at bedtime.     diclofenac Sodium (VOLTAREN) 1 % GEL Apply 1 application topically 4 (four) times daily. 100 g 0   EPIPEN 2-PAK 0.3 MG/0.3ML SOAJ injection INJECT 0.3 MLS (0.3 MG TOTAL) INTO THE MUSCLE ONCE. 1 Device 0   ferrous sulfate 325 (65 FE) MG EC tablet TAKE 1 TABLET BY MOUTH EVERY DAY 90 tablet 3   Fexofenadine HCl (ALLEGRA PO) Take 1 tablet by mouth daily at 12 noon.     glucose blood (TRUE METRIX BLOOD GLUCOSE TEST) test strip USE AS DIRECTED TWICE A DAY 200 strip 3   hydrALAZINE (APRESOLINE) 50 MG tablet TAKE 1 TABLET THREE TIMES A DAY 270 tablet 3   HYDROcodone bit-homatropine (HYCODAN) 5-1.5 MG/5ML syrup Take 5 mLs by mouth every 4 (four) hours as needed for cough. 240 mL 0   HYDROcodone-acetaminophen (NORCO/VICODIN) 5-325 MG tablet Take 1-2 tablets by mouth every 6 (six) hours as needed for moderate pain or severe pain. 12 tablet 0   isosorbide mononitrate (IMDUR) 120 MG 24 hr tablet TAKE 1 TABLET DAILY 90 tablet 3   JANUVIA 100 MG tablet TAKE 1 TABLET DAILY 90 tablet 3   ketoconazole (NIZORAL) 2 % cream APPLY TO AFFECTED AREA DAILY 30 g 5   metFORMIN (GLUCOPHAGE-XR) 500 MG 24 hr tablet TAKE 2 TABLETS TWICE A DAY WITH MEALS 360 tablet 3   metoprolol succinate (TOPROL-XL) 100 MG 24 hr tablet Take 1 tablet (100 mg total) by mouth daily. Take with or immediately following a meal. 90 tablet 3   mupirocin ointment (BACTROBAN) 2 %  Place 1 application into the nose 2 (two) times daily. (Patient taking differently: Apply 1 application topically 2 (two) times daily as needed (wound care).) 22 g 1   polyvinyl alcohol (LIQUIFILM TEARS) 1.4 % ophthalmic solution Place 1 drop into both eyes daily as needed for dry eyes.     sacubitril-valsartan (ENTRESTO) 97-103 MG Take 1 tablet by mouth 2 (two) times daily. 180 tablet 3   TRUEplus Lancets 33G MISC  USE AS INSTRUCTED TO CHECK BLOOD SUGAR 2 TIMES DAILY 200 each 3   Current Facility-Administered Medications on File Prior to Visit  Medication Dose Route Frequency Provider Last Rate Last Admin   0.9 %  sodium chloride infusion  500 mL Intravenous Once Danis, Kirke Corin, MD       Allergies  Allergen Reactions   Bee Venom Anaphylaxis   Family History  Problem Relation Age of Onset   Coronary artery disease Brother    Coronary artery disease Mother    Colon cancer Mother    Cancer Other        fhx   Diabetes Other        fhx   Hyperlipidemia Other        fhx   Hypertension Other        fhx   Stroke Other        fhx   Colon polyps Neg Hx    Esophageal cancer Neg Hx    Rectal cancer Neg Hx    Stomach cancer Neg Hx     PE: BP (!) 156/96 (BP Location: Left Arm, Patient Position: Sitting, Cuff Size: Normal)   Pulse 67   Ht 5' 4.5" (1.638 m)   Wt 248 lb 12.8 oz (112.9 kg)   SpO2 96%   BMI 42.05 kg/m  Body mass index is 42.05 kg/m.  Wt Readings from Last 3 Encounters:  07/19/21 248 lb 12.8 oz (112.9 kg)  04/30/21 248 lb (112.5 kg)  03/09/21 248 lb (112.5 kg)   Constitutional: overweight, in NAD, in crutches Eyes: PERRLA, EOMI, no exophthalmos ENT: moist mucous membranes, no thyromegaly, no cervical lymphadenopathy Cardiovascular: RRR, No MRG, + B LE edema. Respiratory: CTA B Gastrointestinal: abdomen soft, NT, ND, BS+ Musculoskeletal: no deformities, strength intact in all 4 Skin: moist, warm, no rashes Neurological: no tremor with outstretched hands, DTR normal in all 4  ASSESSMENT: 1. DM2, non-insulin-dependent, uncontrolled, with complications - CKD stage 2 - background DR  2.  Obesity class III  3. HL  PLAN:  1.  Patient longstanding, uncontrolled, type 2 diabetes, on metformin and DPP 4 inhibitor, after we stopped sulfonylurea at last visit.  Her insurance did not cover weekly formulations of GLP-1 receptor agonists.  At last visit, sugars were almost  all at goal, lower than 100 and down to the 60s per review of her excellent log.  Therefore, we stopped Amaryl completely.  At that time, HbA1c was excellent: 6.1%. -At today's visit, sugars are after slightly above target in the morning and she is not usually checking later in the day.  She has only 2 values in the last month and one of them is high in the 300s, after eating a large amount of muscadine grapes.  We discussed that these should be eaten in smaller amounts, since they are very sweet and at the end of a meal, rather than as a snack. -At this visit, we discussed about restarting Amaryl before dinner, states sugars are slightly higher.  This could be because  of the fact that she is in pain so I am hoping that after the holidays after the pain resolves, we can stop the Amaryl again.  For now, we will continue metformin and Januvia. - I advised her to:  Patient Instructions  Please continue: - Metformin ER 1000 mg 2x a day with meals  - Januvia 100 mg before breakfast  Please restart: - Amaryl 2 mg before dinner  Please return in 4 months with your sugar log.   - we checked her HbA1c: 7.1% (higher) - advised to check sugars at different times of the day - 1x a day, rotating check times - advised for yearly eye exams >> she is UTD - return to clinic in 4 months  2.  Obesity class III -She continues on metformin and Januvia which are both weight neutral.  At last visit we were able to stop Amaryl, which is weight inducing. -She lost approximately 9 pounds since last visit -Weight stable at this visit  3. HL -Reviewed latest lipid panel from 02/2021: Fractions at goal with the exception of an LDL higher than our goal of less than 70: Lab Results  Component Value Date   CHOL 151 03/09/2021   HDL 48.80 03/09/2021   LDLCALC 81 03/09/2021   TRIG 107.0 03/09/2021   CHOLHDL 3 03/09/2021  -Continues Lipitor 40 mg daily without side effects   Philemon Kingdom, MD PhD [facility]  Endocrinology

## 2021-08-09 ENCOUNTER — Telehealth: Payer: Self-pay | Admitting: Family Medicine

## 2021-08-09 NOTE — Telephone Encounter (Signed)
Left message for patient to call back and schedule Medicare Annual Wellness Visit (AWV) either virtually or in office. Left  my jabber number [phone]   Last AWV 07/22/20  please schedule at anytime with [facility] Nurse Health Advisor 1 or 2   This should be a 45 minute visit.  

## 2021-08-11 ENCOUNTER — Other Ambulatory Visit: Payer: Self-pay | Admitting: Family Medicine

## 2021-08-13 ENCOUNTER — Telehealth: Payer: Self-pay | Admitting: Family Medicine

## 2021-08-13 ENCOUNTER — Encounter: Payer: Self-pay | Admitting: Family Medicine

## 2021-08-13 ENCOUNTER — Telehealth: Payer: Self-pay | Admitting: Cardiology

## 2021-08-13 MED ORDER — METOPROLOL SUCCINATE ER 100 MG PO TB24: 100.0000 mg | ORAL_TABLET | Freq: Every day | ORAL | 0 refills | Status: DC

## 2021-08-13 NOTE — Telephone Encounter (Signed)
  *  STAT* If patient is at the pharmacy, call can be transferred to refill team.   1. Which medications need to be refilled? (please list name of each medication and dose if known) metoprolol succinate (TOPROL-XL) 100 MG 24 hr tablet  2. Which pharmacy/location (including street and city if local pharmacy) is medication to be sent to?[facility] - [place], [place] - [address]   3. Do they need a 30 day or 90 day supply? 60 days   Pt is out of meds please send the 30 days supply to her local pharmacy [id number] - [place], [place] - [address]  Pt said Dr. Clent Ridges is out for 2 weeks and can't refill this meds for her, she is out of meds and needs refill today

## 2021-08-13 NOTE — Telephone Encounter (Signed)
RX sent

## 2021-08-13 NOTE — Telephone Encounter (Signed)
Pt is calling and she is out of metoprolol 100 mg. Pt has sent mychart message to dr fry. Pt would like #30 send to   [id number] - [place], [place] - [address] Phone:  [phone]  Fax:  [phone]    Pt also would like #90 to be sent to   [facility] - [place], [place] - [address] Phone:  [phone]  Fax:  [phone]

## 2021-08-13 NOTE — Telephone Encounter (Signed)
Refills has been sent to the pharmacy. 

## 2021-09-06 ENCOUNTER — Telehealth: Payer: Self-pay

## 2021-09-06 ENCOUNTER — Telehealth: Payer: Self-pay | Admitting: Family Medicine

## 2021-09-06 NOTE — Telephone Encounter (Signed)
Tried calling to schedule Medicare Annual Wellness Visit (AWV) either virtually or in office.   No answer   Last AWV 07/22/20  please schedule at anytime with [facility] Nurse Health Advisor 1 or 2   This should be a 45 minute visit.

## 2021-09-15 ENCOUNTER — Telehealth: Payer: Self-pay | Admitting: Family Medicine

## 2021-09-15 NOTE — Telephone Encounter (Signed)
Spoke to patient to schedule Medicare Annual Wellness Visit (AWV) either virtually or in office.   She wanted to know if she gets a gift.  She stated she would wait till insurance comes to her home.  She wanted a gift for doing this   Last AWV 07/22/20  please schedule at anytime with [facility] Nurse Health Advisor 1 or 2   This should be a 45 minute visit.

## 2021-09-24 ENCOUNTER — Other Ambulatory Visit: Payer: Self-pay | Admitting: *Deleted

## 2021-09-24 ENCOUNTER — Telehealth: Payer: Self-pay | Admitting: Cardiology

## 2021-09-24 MED ORDER — ENTRESTO 97-103 MG PO TABS: 1.0000 | ORAL_TABLET | Freq: Two times a day (BID) | ORAL | 3 refills | Status: DC

## 2021-09-24 NOTE — Telephone Encounter (Signed)
Spoke to patient she stated she was returning Debra's call about Entresto patient assistance form.Advised Stanton Kidney is in clinic.I will send message to her.She stated if she don't answer leave message on voice mail.

## 2021-09-24 NOTE — Telephone Encounter (Signed)
Spoke with pt, Aware of dr crenshaw's recommendations.  °

## 2021-09-24 NOTE — Telephone Encounter (Signed)
Patient states she was returning call. Please advise  

## 2021-09-24 NOTE — Telephone Encounter (Signed)
Unable to reach pt or leave a message mailbox is full. Dr Jens Som saw her husband today and there was a concern about his heart rate. Dr Jens Som discuss with peer and we are not making any changes at this time.

## 2021-10-05 ENCOUNTER — Emergency Department (HOSPITAL_COMMUNITY): Payer: Medicare Other

## 2021-10-05 ENCOUNTER — Emergency Department (HOSPITAL_COMMUNITY)
Admission: EM | Admit: 2021-10-05 | Discharge: 2021-10-05 | Disposition: A | Discharge status: Home / Self Care | Payer: Medicare Other | Attending: Emergency Medicine | Admitting: Emergency Medicine

## 2021-10-05 ENCOUNTER — Telehealth: Payer: Self-pay | Admitting: Surgery

## 2021-10-05 ENCOUNTER — Telehealth: Payer: Self-pay | Admitting: Family Medicine

## 2021-10-05 ENCOUNTER — Other Ambulatory Visit: Payer: Self-pay

## 2021-10-05 DIAGNOSIS — Z8616 Personal history of COVID-19: Secondary | ICD-10-CM | POA: Insufficient documentation

## 2021-10-05 DIAGNOSIS — J449 Chronic obstructive pulmonary disease, unspecified: Secondary | ICD-10-CM | POA: Diagnosis not present

## 2021-10-05 DIAGNOSIS — Z7982 Long term (current) use of aspirin: Secondary | ICD-10-CM | POA: Diagnosis not present

## 2021-10-05 DIAGNOSIS — R000 Tachycardia, unspecified: Secondary | ICD-10-CM | POA: Insufficient documentation

## 2021-10-05 DIAGNOSIS — I130 Hypertensive heart and chronic kidney disease with heart failure and stage 1 through stage 4 chronic kidney disease, or unspecified chronic kidney disease: Secondary | ICD-10-CM | POA: Insufficient documentation

## 2021-10-05 DIAGNOSIS — N182 Chronic kidney disease, stage 2 (mild): Secondary | ICD-10-CM | POA: Insufficient documentation

## 2021-10-05 DIAGNOSIS — Z7984 Long term (current) use of oral hypoglycemic drugs: Secondary | ICD-10-CM | POA: Diagnosis not present

## 2021-10-05 DIAGNOSIS — Z955 Presence of coronary angioplasty implant and graft: Secondary | ICD-10-CM | POA: Insufficient documentation

## 2021-10-05 DIAGNOSIS — R030 Elevated blood-pressure reading, without diagnosis of hypertension: Secondary | ICD-10-CM | POA: Diagnosis present

## 2021-10-05 DIAGNOSIS — D631 Anemia in chronic kidney disease: Secondary | ICD-10-CM | POA: Insufficient documentation

## 2021-10-05 DIAGNOSIS — I509 Heart failure, unspecified: Secondary | ICD-10-CM | POA: Diagnosis not present

## 2021-10-05 DIAGNOSIS — R0602 Shortness of breath: Secondary | ICD-10-CM | POA: Diagnosis not present

## 2021-10-05 DIAGNOSIS — E1122 Type 2 diabetes mellitus with diabetic chronic kidney disease: Secondary | ICD-10-CM | POA: Insufficient documentation

## 2021-10-05 DIAGNOSIS — Z79899 Other long term (current) drug therapy: Secondary | ICD-10-CM | POA: Insufficient documentation

## 2021-10-05 DIAGNOSIS — I160 Hypertensive urgency: Secondary | ICD-10-CM

## 2021-10-05 LAB — URINALYSIS, ROUTINE W REFLEX MICROSCOPIC
Bilirubin Urine: NEGATIVE
Glucose, UA: NEGATIVE mg/dL
Hgb urine dipstick: NEGATIVE
Ketones, ur: NEGATIVE mg/dL
Leukocytes,Ua: NEGATIVE
Nitrite: NEGATIVE
Protein, ur: NEGATIVE mg/dL
Specific Gravity, Urine: 1.01 (ref 1.005–1.030)
pH: 6.5 (ref 5.0–8.0)

## 2021-10-05 LAB — CBC WITH DIFFERENTIAL/PLATELET
Abs Immature Granulocytes: 0.02 10*3/uL (ref 0.00–0.07)
Basophils Absolute: 0 10*3/uL (ref 0.0–0.1)
Basophils Relative: 0 %
Eosinophils Absolute: 0.1 10*3/uL (ref 0.0–0.5)
Eosinophils Relative: 1 %
HCT: 36.8 % (ref 36.0–46.0)
Hemoglobin: 11.5 g/dL — ABNORMAL LOW (ref 12.0–15.0)
Immature Granulocytes: 0 %
Lymphocytes Relative: 22 %
Lymphs Abs: 1.8 10*3/uL (ref 0.7–4.0)
MCH: 25.8 pg — ABNORMAL LOW (ref 26.0–34.0)
MCHC: 31.3 g/dL (ref 30.0–36.0)
MCV: 82.5 fL (ref 80.0–100.0)
Monocytes Absolute: 0.6 10*3/uL (ref 0.1–1.0)
Monocytes Relative: 7 %
Neutro Abs: 5.7 10*3/uL (ref 1.7–7.7)
Neutrophils Relative %: 70 %
Platelets: 232 10*3/uL (ref 150–400)
RBC: 4.46 MIL/uL (ref 3.87–5.11)
RDW: 14.4 % (ref 11.5–15.5)
WBC: 8.2 10*3/uL (ref 4.0–10.5)
nRBC: 0 % (ref 0.0–0.2)

## 2021-10-05 LAB — BRAIN NATRIURETIC PEPTIDE: B Natriuretic Peptide: 195.1 pg/mL — ABNORMAL HIGH (ref 0.0–100.0)

## 2021-10-05 LAB — BASIC METABOLIC PANEL
Anion gap: 11 (ref 5–15)
BUN: 14 mg/dL (ref 8–23)
CO2: 22 mmol/L (ref 22–32)
Calcium: 9.6 mg/dL (ref 8.9–10.3)
Chloride: 105 mmol/L (ref 98–111)
Creatinine, Ser: 0.7 mg/dL (ref 0.44–1.00)
GFR, Estimated: >60 mL/min (ref >60)
Glucose, Bld: 106 mg/dL — ABNORMAL HIGH (ref 70–99)
Potassium: 4 mmol/L (ref 3.5–5.1)
Sodium: 138 mmol/L (ref 135–145)

## 2021-10-05 LAB — TROPONIN I (HIGH SENSITIVITY)
Troponin I (High Sensitivity): 13 ng/L (ref <18)
Troponin I (High Sensitivity): 19 ng/L — ABNORMAL HIGH (ref <18)

## 2021-10-05 MED ORDER — ALBUTEROL SULFATE HFA 108 (90 BASE) MCG/ACT IN AERS: 1.0000 | INHALATION_SPRAY | RESPIRATORY_TRACT | 0 refills | Status: DC | PRN

## 2021-10-05 MED ORDER — ALBUTEROL SULFATE (2.5 MG/3ML) 0.083% IN NEBU: 2.5000 mg | INHALATION_SOLUTION | Freq: Four times a day (QID) | RESPIRATORY_TRACT | 12 refills | Status: DC | PRN

## 2021-10-05 NOTE — Telephone Encounter (Signed)
Telephone encounter created to prescribe nebulized solution as patient called back requesting this refill greater than 2 hours after her discharge.  This chart was dictated using voice recognition software, Dragon. Despite the best efforts of this provider to proofread and correct errors, errors may still occur which can change documentation meaning.

## 2021-10-05 NOTE — ED Provider Notes (Addendum)
Emergency Medicine Provider Triage Evaluation Note  Krista Benjamin , a 70 y.o. female  was evaluated in triage.  [facility] with systolic in the 200s.  She states that this is normal for her and she does not take her blood pressure medicine and she did not take it this morning prior to her appointment there due to concern for her needs to urinate more frequently with her blood pressure medication.  She states that she is not had any chest pain, palpitations, headaches, blurred or double vision, or any neurologic symptoms.  She has had some shortness of breath but she has history of COPD and has significantly improved with her nebulizer treatments at home.  She is not having shortness of breath at this time.  She states that she did did not want to, then she got nervous because of the advice from the provider at the minute clinic. Hx of CHF.  Review of Systems  Positive: Shortness of breath, cough Negative: Fevers, chills, chest pain, blurred vision, headache  Physical Exam  BP (!) 200/94 (BP Location: Left Arm)    Pulse (!) 103    Temp 98.5 F (36.9 C) (Oral)    Resp 18    SpO2 94%  Gen:   Awake, no distress   Resp:  Normal effort  MSK:   Moves extremities without difficulty  Other:  RRR no social history.  Lungs CTA B.  Medical Decision Making  Medically screening exam initiated at 1:29 PM.  Appropriate orders placed.  Milla Wahlberg was informed that the remainder of the evaluation will be completed by another provider, this initial triage assessment does not replace that evaluation, and the importance of remaining in the ED until their evaluation is complete.  Patient is well-appearing.   This chart was dictated using voice recognition software, Dragon. Despite the best efforts of this provider to proofread and correct errors, errors may still occur which can change documentation meaning.    Paris Lore,  PA-C 10/05/21 1331    Seletha Zimmermann, Idelia Salm 10/05/21 1341    Pollyann Savoy, MD 10/05/21 1435

## 2021-10-05 NOTE — ED Provider Notes (Signed)
[facility] EMERGENCY DEPARTMENT Provider Note   CSN: [id number] Arrival date & time: 10/05/21  1217     History Chief Complaint  Patient presents with   Hypertension    Krista Benjamin is a 71 y.o. female Pt [facility] for elevated blood pressure with systolic in the 696E.  She states that this is normal for her and she does not take her blood pressure medicine and she did not take it this morning prior to her appointment there due to concern for her needs to urinate more frequently with her blood pressure medication.  She states that she is not had any chest pain, palpitations, headaches, blurred or double vision, or any neurologic symptoms.  She has had some shortness of breath but she has history of COPD and has significantly improved with her nebulizer treatments at home.  She is not having shortness of breath at this time.  She states that she did did not want to, then she got nervous because of the advice from the provider at the [facility] clinic. Hx of CHF.   HPI     Past Medical History:  Diagnosis Date   Anemia    Aortic stenosis    Mild   Arthritis    Cataract    both eyes  (cataracts removed)   Diabetes mellitus    sees Dr. Cruzita Lederer    Gynecological examination    sees Dr. Nori Riis    Hyperlipidemia    Hypertension    sees Dr. Marijo File   Stroke [facility])    TIA    Patient Active Problem List   Diagnosis Date Noted   Educated about COVID-19 virus infection 02/26/2020   Cervical strain 01/24/2019   Dilated cardiomyopathy ([place]) 09/14/2018   Nonrheumatic aortic valve stenosis 08/12/2018   CKD stage 2 due to type 2 diabetes mellitus ([place]) 04/27/2017   AORTIC VALVE DISORDERS 04/26/2010   Obesity, unspecified 05/07/2009   MURMUR 05/07/2009   CHEST PAIN 05/07/2009   LOW BACK PAIN 01/09/2008   ANEMIA-IRON DEFICIENCY 11/21/2007   Weakness 11/21/2007   Mixed hyperlipidemia 10/26/2007   Essential hypertension  10/26/2007    Past Surgical History:  Procedure Laterality Date   CARPAL TUNNEL RELEASE  01/22/14   Left Hand    CATARACT EXTRACTION     Lens surgery   COLONOSCOPY  02/06/2018   per Dr. Loletha Carrow, no polyps, repeat in 5 yrs (colon CA in mother)    excision of benign cyst     from left maxilla and left maxillary sinus   KNEE SURGERY     RIGHT/LEFT HEART CATH AND CORONARY ANGIOGRAPHY N/A 09/14/2018   Procedure: RIGHT/LEFT HEART CATH AND CORONARY ANGIOGRAPHY;  Surgeon: Martinique, Peter M, MD;  Location: [facility];  Service: Cardiovascular;  Laterality: N/A;   SHOULDER ARTHROSCOPY W/ ROTATOR CUFF REPAIR Left 09-23-13   per Dr. Devonne Doughty at [facility] in [place] History   No obstetric history on file.     Family History  Problem Relation Age of Onset   Coronary artery disease Brother    Coronary artery disease Mother    Colon cancer Mother    Cancer Other        fhx   Diabetes Other        fhx   Hyperlipidemia Other        fhx   Hypertension Other  fhx   Stroke Other        fhx   Colon polyps Neg Hx    Esophageal cancer Neg Hx    Rectal cancer Neg Hx    Stomach cancer Neg Hx     Social History   Tobacco Use   Smoking status: Never   Smokeless tobacco: Never  Vaping Use   Vaping Use: Never used  Substance Use Topics   Alcohol use: No    Alcohol/week: 0.0 standard drinks   Drug use: No    Home Medications Prior to Admission medications   Medication Sig Start Date End Date Taking? Authorizing Provider  albuterol (PROVENTIL) (2.5 MG/3ML) 0.083% nebulizer solution Take 3 mLs (2.5 mg total) by nebulization every 6 (six) hours as needed for wheezing or shortness of breath. 10/05/21   Haneefah Venturini, Eugene Garnet R, PA-C  albuterol (VENTOLIN HFA) 108 (90 Base) MCG/ACT inhaler Inhale 1-2 puffs into the lungs every 4 (four) hours as needed for wheezing or shortness of breath. 10/05/21  Yes Jisella Ashenfelter, Gypsy Balsam, PA-C  Alcohol Swabs (ALCOHOL  PREP) 70 % PADS USE TO CHECK BLOOD SUGAR ONCE DAILY 01/11/21   Philemon Kingdom, MD  aspirin EC 81 MG tablet Take 81 mg by mouth at bedtime. Swallow whole.    [provider]  atorvastatin (LIPITOR) 40 MG tablet TAKE 1 TABLET AT BEDTIME 05/03/21   Laurey Morale, MD  azithromycin (ZITHROMAX Z-PAK) 250 MG tablet As directed 04/30/21   Laurey Morale, MD  Blood Glucose Monitoring Suppl (TRUE METRIX GO GLUCOSE METER) w/Device KIT Use as instructed to check blood sugar 2 times daily 03/09/21   Philemon Kingdom, MD  bumetanide (BUMEX) 1 MG tablet TAKE 2 TABLETS AT BEDTIME 08/11/21   Laurey Morale, MD  cholecalciferol (VITAMIN D) 1000 UNITS tablet Take 2,000 Units by mouth at bedtime.    [provider]  diclofenac Sodium (VOLTAREN) 1 % GEL Apply 1 application topically 4 (four) times daily. 12/27/20   Melynda Ripple, MD  EPIPEN 2-PAK 0.3 MG/0.3ML SOAJ injection INJECT 0.3 MLS (0.3 MG TOTAL) INTO THE MUSCLE ONCE. 04/19/16   Laurey Morale, MD  ferrous sulfate 325 (65 FE) MG EC tablet TAKE 1 TABLET BY MOUTH EVERY DAY 03/26/21   Laurey Morale, MD  Fexofenadine HCl (ALLEGRA PO) Take 1 tablet by mouth daily at 12 noon.    [provider]  glimepiride (AMARYL) 2 MG tablet Take 1 tablet (2 mg total) by mouth daily before breakfast. 07/19/21   Philemon Kingdom, MD  glucose blood (TRUE METRIX BLOOD GLUCOSE TEST) test strip USE AS DIRECTED TWICE A DAY 03/11/21   Philemon Kingdom, MD  hydrALAZINE (APRESOLINE) 50 MG tablet TAKE 1 TABLET THREE TIMES A DAY 02/11/21   Minus Breeding, MD  HYDROcodone bit-homatropine (HYCODAN) 5-1.5 MG/5ML syrup Take 5 mLs by mouth every 4 (four) hours as needed for cough. 04/30/21   Laurey Morale, MD  HYDROcodone-acetaminophen (NORCO/VICODIN) 5-325 MG tablet Take 1-2 tablets by mouth every 6 (six) hours as needed for moderate pain or severe pain. Patient not taking: Reported on 07/19/2021 12/27/20   Melynda Ripple, MD  isosorbide mononitrate (IMDUR) 120 MG 24  hr tablet TAKE 1 TABLET DAILY 04/06/21   Minus Breeding, MD  JANUVIA 100 MG tablet TAKE 1 TABLET DAILY 01/18/21   Philemon Kingdom, MD  ketoconazole (NIZORAL) 2 % cream APPLY TO AFFECTED AREA DAILY 04/14/21   Laurey Morale, MD  metFORMIN (GLUCOPHAGE-XR) 500 MG 24 hr tablet Take 2 tablets  2x a day with meals 07/19/21   Philemon Kingdom, MD  metoprolol succinate (TOPROL-XL) 100 MG 24 hr tablet Take 1 tablet (100 mg total) by mouth daily. Take with or immediately following a meal. 08/13/21   Minus Breeding, MD  mupirocin ointment (BACTROBAN) 2 % Place 1 application into the nose 2 (two) times daily. Patient taking differently: Apply 1 application topically 2 (two) times daily as needed (wound care). 12/07/18   Laurey Morale, MD  polyvinyl alcohol (LIQUIFILM TEARS) 1.4 % ophthalmic solution Place 1 drop into both eyes daily as needed for dry eyes.    [provider]  sacubitril-valsartan (ENTRESTO) 97-103 MG Take 1 tablet by mouth 2 (two) times daily. 09/24/21   Minus Breeding, MD  TRUEplus Lancets 33G MISC USE AS INSTRUCTED TO CHECK BLOOD SUGAR 2 TIMES DAILY 03/09/21   Philemon Kingdom, MD    Allergies    Bee venom  Review of Systems   Review of Systems  Constitutional: Negative.   HENT: Negative.    Eyes: Negative.   Respiratory:  Positive for cough, chest tightness and shortness of breath.   Cardiovascular: Negative.   Gastrointestinal: Negative.   Genitourinary: Negative.   Musculoskeletal: Negative.   Skin: Negative.   Neurological: Negative.   Hematological: Negative.    Physical Exam Updated Vital Signs BP (!) 200/94 (BP Location: Left Arm)    Pulse (!) 103    Temp 98.5 F (36.9 C) (Oral)    Resp 18    SpO2 94%   Physical Exam Vitals and nursing note reviewed.  Constitutional:      Appearance: She is not ill-appearing or toxic-appearing.  HENT:     Head: Normocephalic and atraumatic.     Nose: Nose normal. No congestion.     Mouth/Throat:     Mouth: Mucous  membranes are moist.     Pharynx: Oropharynx is clear. Uvula midline. No oropharyngeal exudate or posterior oropharyngeal erythema.  Eyes:     General: Lids are normal. Vision grossly intact.        Right eye: No discharge.        Left eye: No discharge.     Extraocular Movements: Extraocular movements intact.     Conjunctiva/sclera: Conjunctivae normal.     Pupils: Pupils are equal, round, and reactive to light.  Neck:     Trachea: Trachea normal.  Cardiovascular:     Rate and Rhythm: Normal rate and regular rhythm.     Pulses: Normal pulses.     Heart sounds: Normal heart sounds. No murmur heard. Pulmonary:     Effort: Pulmonary effort is normal. No tachypnea, bradypnea, accessory muscle usage, prolonged expiration or respiratory distress.     Breath sounds: Normal breath sounds. No wheezing or rales.  Chest:     Chest wall: No mass, lacerations, deformity, swelling, tenderness, crepitus or edema.  Abdominal:     General: Bowel sounds are normal. There is no distension.     Palpations: Abdomen is soft.     Tenderness: There is no abdominal tenderness. There is no right CVA tenderness, left CVA tenderness, guarding or rebound.  Musculoskeletal:        General: No deformity.     Cervical back: Normal range of motion and neck supple. No edema, rigidity, tenderness or crepitus. No pain with movement, spinous process tenderness or muscular tenderness.     Right lower leg: No edema.     Left lower leg: No edema.  Lymphadenopathy:  Cervical: No cervical adenopathy.  Skin:    General: Skin is warm and dry.     Capillary Refill: Capillary refill takes less than 2 seconds.     Findings: No rash.  Neurological:     General: No focal deficit present.     Mental Status: She is alert and oriented to person, place, and time. Mental status is at baseline.     GCS: GCS eye subscore is 4. GCS verbal subscore is 5. GCS motor subscore is 6.     Gait: Gait is intact.  Psychiatric:         Mood and Affect: Mood normal.    ED Results / Procedures / Treatments   Labs (all labs ordered are listed, but only abnormal results are displayed) Labs Reviewed  BASIC METABOLIC PANEL - Abnormal; Notable for the following components:      Result Value   Glucose, Bld 106 (*)    All other components within normal limits  CBC WITH DIFFERENTIAL/PLATELET - Abnormal; Notable for the following components:   Hemoglobin 11.5 (*)    MCH 25.8 (*)    All other components within normal limits  BRAIN NATRIURETIC PEPTIDE - Abnormal; Notable for the following components:   B Natriuretic Peptide 195.1 (*)    All other components within normal limits  TROPONIN I (HIGH SENSITIVITY) - Abnormal; Notable for the following components:   Troponin I (High Sensitivity) 19 (*)    All other components within normal limits  URINALYSIS, ROUTINE W REFLEX MICROSCOPIC  TROPONIN I (HIGH SENSITIVITY)    EKG None  Radiology DG Chest 2 View  Result Date: 10/05/2021 CLINICAL DATA:  Shortness of breath, cough since Saturday EXAM: CHEST - 2 VIEW COMPARISON:  Chest radiograph 06/27/2006 FINDINGS: The heart is mildly enlarged. The upper mediastinal contours are normal. There is no focal consolidation or pulmonary edema. There is no pleural effusion or pneumothorax. There is no acute osseous abnormality. IMPRESSION: 1. No radiographic evidence of acute cardiopulmonary process. 2. Mild cardiomegaly. Electronically Signed   By: Valetta Mole M.D.   On: 10/05/2021 14:08    Procedures Procedures   Medications Ordered in ED Medications - No data to display  ED Course  I have reviewed the triage vital signs and the nursing notes.  Pertinent labs & imaging results that were available during my care of the patient were reviewed by me and considered in my medical decision making (see chart for details).    MDM Rules/Calculators/A&P                         70 year old female with history of hypertension who is not taking  her blood pressure medication today was directed to the ED by urgent care for hypertensive emergency.  Presented initially for COPD exacerbation.  Hypertensive to 200/94 on intake.  Very mildly tachycardic to 103.  Patient is completely asymptomatic, feeling very well at this time.  Cardiopulmonary exam is normal, abdominal exam is benign.  Patient is neurovascularly intact in all 4 extremities.  CBC with mild anemia with hemoglobin 11.5.  BMP unremarkable.  BNP unremarkable 195. Troponin negative, 13.  Patient has had her second troponin drawn and is pending this time but she is expressing clear wish to be discharged home.  She remains asymptomatic and as initial work-up is negative, doubt hypertensive emergency.  Patient agrees to take her blood pressure medications at home and follow-up closely with her outpatient providers.  No further work-up  warranted at this time.  Crystalyn voiced understanding is medical evaluation and treatment plan.  She was questions answered to his expressed satisfaction.  Return precautions are given.  Patient is well-appearing, stable, and appropriate for discharge at this time.  This chart was dictated using voice recognition software, Dragon. Despite the best efforts of this provider to proofread and correct errors, errors may still occur which can change documentation meaning.  Final Clinical Impression(s) / ED Diagnoses Final diagnoses:  Hypertensive urgency    Rx / DC Orders ED Discharge Orders          Ordered    albuterol (VENTOLIN HFA) 108 (90 Base) MCG/ACT inhaler  Every 4 hours PRN        10/05/21 1550             Tajae Maiolo, Gypsy Balsam, PA-C 10/05/21 2246    Truddie Hidden, MD 10/06/21 [phone]

## 2021-10-05 NOTE — Discharge Instructions (Signed)
Please take your blood pressure medications as prescribed at home and follow close with your cardiologist.  Return here if you develop any chest pain with breathing, nausea or vomiting, headache, blurry double vision, or any other new severe symptoms.

## 2021-10-05 NOTE — ED Triage Notes (Signed)
[facility] and was [facility] for further eval. Did not take her meds this morning because she had an appointment and did not want to be urinating constantly, as she does when she takes the medication. Denies chest pain or sob.

## 2021-10-05 NOTE — Telephone Encounter (Signed)
ED RNCM received call from patient concerning ED discharge prescription not being at the pharmacy.  A  message was sent to the the EDP to follow up with this matter.

## 2021-10-05 NOTE — Telephone Encounter (Signed)
FYI

## 2021-10-05 NOTE — Telephone Encounter (Signed)
Pt call and stated the pharmacist at [facility] was sending her to the ER because of her bp is to high.

## 2021-10-11 ENCOUNTER — Other Ambulatory Visit: Payer: Self-pay | Admitting: Internal Medicine

## 2021-10-12 ENCOUNTER — Ambulatory Visit (INDEPENDENT_AMBULATORY_CARE_PROVIDER_SITE_OTHER): Payer: Medicare Other | Admitting: Family Medicine

## 2021-10-12 ENCOUNTER — Encounter: Payer: Self-pay | Admitting: Family Medicine

## 2021-10-12 VITALS — BP 150/84 | HR 73 | Temp 98.7°F | Wt 246.0 lb

## 2021-10-12 DIAGNOSIS — I10 Essential (primary) hypertension: Secondary | ICD-10-CM | POA: Diagnosis not present

## 2021-10-12 DIAGNOSIS — J40 Bronchitis, not specified as acute or chronic: Secondary | ICD-10-CM

## 2021-10-12 MED ORDER — AZITHROMYCIN 250 MG PO TABS: ORAL_TABLET | ORAL | 0 refills | Status: DC

## 2021-10-12 MED ORDER — HYDROCODONE BIT-HOMATROP MBR 5-1.5 MG/5ML PO SOLN: 5.0000 mL | ORAL | 0 refills | Status: DC | PRN

## 2021-10-12 NOTE — Progress Notes (Signed)
° °  Subjective:    Patient ID: Krista Benjamin, female    DOB: Feb 28, 1951, 71 y.o.   MRN: [id number]  HPI Here to follow up an ED visit on 10-05-21 for uncontrolled HTN. Prior to this visit she had stopped taking her BP medications because made her urinate too often. At the visit her exam was otherwise normal. Labs were normal including a creatinine of 0.7, a CXR, and an EKG. She was advised to resume her BP medications and to follow up with [place]. She is now taking the Metorpolol and Bumetanide as before. Her other complaint is 5 days of chest tightness and a dry cough. No fever or chest pain or SOB. No ST or body aches.    Review of Systems  Constitutional: Negative.   HENT: Negative.    Eyes: Negative.   Respiratory:  Positive for cough and chest tightness. Negative for shortness of breath and wheezing.   Cardiovascular:  Positive for leg swelling. Negative for chest pain and palpitations.  Neurological: Negative.       Objective:   Physical Exam Constitutional:      Appearance: Normal appearance. She is not ill-appearing.  HENT:     Right Ear: Tympanic membrane, ear canal and external ear normal.     Left Ear: Tympanic membrane, ear canal and external ear normal.     Nose: Nose normal.     Mouth/Throat:     Pharynx: Oropharynx is clear.  Eyes:     Conjunctiva/sclera: Conjunctivae normal.  Cardiovascular:     Rate and Rhythm: Normal rate and regular rhythm.     Pulses: Normal pulses.     Heart sounds: Normal heart sounds.  Pulmonary:     Effort: Pulmonary effort is normal.     Breath sounds: Normal breath sounds.  Musculoskeletal:     Comments: 1+ edema in both ankles   Lymphadenopathy:     Cervical: No cervical adenopathy.  Neurological:     Mental Status: She is alert.          Assessment & Plan:  Now that she is back on the Metoprolol and Bumetanide her BP is well controlled. She promises me she will stay on the medications. She also has a bronchitis, and we will  treat this with a Zpack. We spent a total of ( 33  ) minutes reviewing records and discussing these issues.  Alysia Penna, MD

## 2021-10-19 ENCOUNTER — Telehealth: Payer: Self-pay | Admitting: *Deleted

## 2021-10-19 NOTE — Telephone Encounter (Signed)
Patient has been approved for entresto patient assistance until 10/09/22. °

## 2021-11-22 ENCOUNTER — Other Ambulatory Visit: Payer: Self-pay | Admitting: Family Medicine

## 2021-11-23 ENCOUNTER — Ambulatory Visit: Payer: Medicare Other | Admitting: Internal Medicine

## 2021-11-23 NOTE — Progress Notes (Unsigned)
Patient ID: Krista Benjamin, female   DOB: 02/22/1951, 71 y.o.   MRN: [id number]  This visit occurred during the SARS-CoV-2 public health emergency.  Safety protocols were in place, including screening questions prior to the visit, additional usage of staff PPE, and extensive cleaning of exam room while observing appropriate contact time as indicated for disinfecting solutions.   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  Last visit 4 months ago. She has [place].   Interim history: She was previously on a "21-day fast": No red meat, sugar, starches.  She was off the diet at last visit She has increased urination but no blurry vision, nausea, chest pain.  She has joint pain-in the left knee.  At last visit she had a fractured tibia and was in crutches, previously in wheelchair. Since last visit she was in the ED on 10/05/2021 with hypertensive urgency.  Reviewed HbA1c levels: Lab Results  Component Value Date   HGBA1C 7.1 (A) 07/19/2021   HGBA1C 6.1 (A) 11/20/2020   HGBA1C 6.5 (A) 07/24/2020  Prev. 5.9% on 12/2012, previously 6.1%.  Pt is on a regimen of: - Metformin ER 1000 mg 2x a day with meals  - Tradjenta 5 >> Januvia 100 mg before breakfast - per insurance pref. - Amaryl 2 mg before dinner Other medications tried: Rec'd GLP1 agonist >> Victoza covered >> she was afraid of SEs. We tried Jardiance 10 mg daily in am - added 04/2017 >> yeast inf >> stopped We tried Invokana 100 mg in am >> stopped after 3 days >> nausea, diarrhea We stopped Lantus 10 units qhs. We stopped Actos 45 mg (was on it since 07/2001) Metformin IR 1000 mg bid >> stopped b/c Nausea/Diarrhea She was on Novolog 8 units tid ac, when sugars >140  We stopped Amaryl 4 mg before breakfast in 11/2020.  Pt checks her sugars 1-2 times a day: - am:  53, 59, 66-113, 156 >>  64-112 >> 104-143, 155, 208 - 2h after b'fast:  135, 139, 193 >> 189 >> 115 >> n/c - lunch: 135-169 >> 183 >> 71, 82 >> n/c >> 156 >> 79 >> n/c - 2h after lunch: 303 >> n/c >> 201 >> 104 >> n/c - dinner: 171, 178, 195 (steroids) >> n/c >> 62 >> n/c - 2h after dinner: 197, 210, 231  >> 115-173 >> n/c - bedtime: 100, 191 (after grapes) >> 81 >>132, 364 (grapes) - night:   76, 137 >> n/c >> 184 >> 125, 128 >> n/c  Lowest sugar:  53 >> 104; she has hypoglycemia awareness in the 70s. Highest sugar: 300 (at the time of her fx) >> .Marland KitchenMarland Kitchen191 >> 364.  Glucometer: One Touch Verio Flex  She lost 80 pounds since 2012 >> She was able to come off insulin then. She saw Antonieta Iba (nutritionist) on 01/01/2015.  -+ Mild CKD. Latest BUN/creatinine was:  Lab Results  Component Value Date   BUN 14 10/05/2021   Lab Results  Component Value Date   CREATININE 0.70 10/05/2021  On Entresto.  Lab Results  Component Value Date   MICRALBCREAT 3.4 11/20/2020   MICRALBCREAT 2.3 08/26/2015   MICRALBCREAT 5.5 09/22/2014   MICRALBCREAT 5.4 11/21/2007   -+ HL.  Latest lipid profile: Lab Results  Component Value Date   CHOL 151 03/09/2021   HDL 48.80 03/09/2021   LDLCALC 81 03/09/2021   TRIG  107.0 03/09/2021   CHOLHDL 3 03/09/2021  On Lipitor 40.  - last eye exam: 03/2021: No DR; previous history of background DR; + h/o B cataract sx.  - no numbness and tingling in her legs.  She also has a history of HTN, iron deficiency anemia-on iron therapy, aortic valve disease/murmur, left ventricular hypertrophy per EKG, obesity, multinodular goiter, fibroids.  She had shingles in 11/2014 and 02/2016.  She fell and fractured R wrist 06/2018. She had another, right radial, fracture 01/2019.  ROS: + See HPI  I reviewed pt's medications, allergies, PMH, social hx, family hx, and changes were documented in the history of present illness. Otherwise, unchanged from my initial visit note.  Past Medical History:   Diagnosis Date   Anemia    Aortic stenosis    Mild   Arthritis    Cataract    both eyes  (cataracts removed)   Diabetes mellitus    sees Dr. Cruzita Lederer    Gynecological examination    sees Dr. Nori Riis    Hyperlipidemia    Hypertension    sees Dr. Marijo File   Stroke [facility])    TIA   Past Surgical History:  Procedure Laterality Date   CARPAL TUNNEL RELEASE  01/22/14   Left Hand    CATARACT EXTRACTION     Lens surgery   COLONOSCOPY  02/06/2018   per Dr. Loletha Carrow, no polyps, repeat in 5 yrs (colon CA in mother)    excision of benign cyst     from left maxilla and left maxillary sinus   KNEE SURGERY     RIGHT/LEFT HEART CATH AND CORONARY ANGIOGRAPHY N/A 09/14/2018   Procedure: RIGHT/LEFT HEART CATH AND CORONARY ANGIOGRAPHY;  Surgeon: Martinique, Peter M, MD;  Location: [place] [facility];  Service: Cardiovascular;  Laterality: N/A;   SHOULDER ARTHROSCOPY W/ ROTATOR CUFF REPAIR Left 09-23-13   per Dr. Devonne Doughty at [facility] center in [place] History   Socioeconomic History   Marital status: Married    Spouse name: Not on file   Number of children: Not on file   Years of education: Not on file   Highest education level: Not on file  Occupational History   Not on file  Tobacco Use   Smoking status: Never   Smokeless tobacco: Never  Vaping Use   Vaping Use: Never used  Substance and Sexual Activity   Alcohol use: No    Alcohol/week: 0.0 standard drinks   Drug use: No   Sexual activity: Not on file  Other Topics Concern   Not on file  Social History Narrative   Not on file   Social Determinants of Health   Financial Resource Strain: Not on file  Food Insecurity: Not on file  Transportation Needs: Not on file  Physical Activity: Not on file  Stress: Not on file  Social Connections: Not on file  Intimate Partner Violence: Not on file   Current Outpatient Medications on File Prior to Visit  Medication Sig Dispense Refill   albuterol (PROVENTIL)  (2.5 MG/3ML) 0.083% nebulizer solution Take 3 mLs (2.5 mg total) by nebulization every 6 (six) hours as needed for wheezing or shortness of breath. 75 mL 12   albuterol (VENTOLIN HFA) 108 (90 Base) MCG/ACT inhaler Inhale 1-2 puffs into the lungs every 4 (four) hours as needed for wheezing or shortness of breath. 1 each 0   Alcohol Swabs (ALCOHOL PREP) 70 % PADS USE TO CHECK BLOOD SUGAR ONCE DAILY  200 each 3   aspirin EC 81 MG tablet Take 81 mg by mouth at bedtime. Swallow whole.     atorvastatin (LIPITOR) 40 MG tablet TAKE 1 TABLET AT BEDTIME 90 tablet 3   azithromycin (ZITHROMAX Z-PAK) 250 MG tablet As directed 6 each 0   Blood Glucose Monitoring Suppl (TRUE METRIX GO GLUCOSE METER) w/Device KIT Use as instructed to check blood sugar 2 times daily 1 kit 0   bumetanide (BUMEX) 1 MG tablet TAKE 2 TABLETS AT BEDTIME 180 tablet 3   cholecalciferol (VITAMIN D) 1000 UNITS tablet Take 2,000 Units by mouth at bedtime.     diclofenac Sodium (VOLTAREN) 1 % GEL Apply 1 application topically 4 (four) times daily. 100 g 0   EPIPEN 2-PAK 0.3 MG/0.3ML SOAJ injection INJECT 0.3 MLS (0.3 MG TOTAL) INTO THE MUSCLE ONCE. 1 Device 0   ferrous sulfate 325 (65 FE) MG EC tablet TAKE 1 TABLET BY MOUTH EVERY DAY 90 tablet 3   Fexofenadine HCl (ALLEGRA PO) Take 1 tablet by mouth daily at 12 noon.     glimepiride (AMARYL) 2 MG tablet Take 1 tablet (2 mg total) by mouth daily before breakfast. 30 tablet 3   glucose blood (TRUE METRIX BLOOD GLUCOSE TEST) test strip USE AS DIRECTED TWICE A DAY 200 strip 3   hydrALAZINE (APRESOLINE) 50 MG tablet TAKE 1 TABLET THREE TIMES A DAY 270 tablet 3   HYDROcodone bit-homatropine (HYCODAN) 5-1.5 MG/5ML syrup Take 5 mLs by mouth every 4 (four) hours as needed for cough. 240 mL 0   HYDROcodone-acetaminophen (NORCO/VICODIN) 5-325 MG tablet Take 1-2 tablets by mouth every 6 (six) hours as needed for moderate pain or severe pain. 12 tablet 0   isosorbide mononitrate (IMDUR) 120 MG 24 hr tablet  TAKE 1 TABLET DAILY 90 tablet 3   JANUVIA 100 MG tablet TAKE 1 TABLET DAILY 90 tablet 3   ketoconazole (NIZORAL) 2 % cream APPLY TO AFFECTED AREA DAILY 30 g 5   metFORMIN (GLUCOPHAGE-XR) 500 MG 24 hr tablet TAKE 2 TABLETS TWICE A DAY WITH MEALS 360 tablet 3   metoprolol succinate (TOPROL-XL) 100 MG 24 hr tablet TAKE 1 TABLET DAILY, TAKE WITH OR IMMEDIATELY FOLLOWING A MEAL 90 tablet 1   mupirocin ointment (BACTROBAN) 2 % Place 1 application into the nose 2 (two) times daily. (Patient taking differently: Apply 1 application topically 2 (two) times daily as needed (wound care).) 22 g 1   polyvinyl alcohol (LIQUIFILM TEARS) 1.4 % ophthalmic solution Place 1 drop into both eyes daily as needed for dry eyes.     sacubitril-valsartan (ENTRESTO) 97-103 MG Take 1 tablet by mouth 2 (two) times daily. 180 tablet 3   TRUEplus Lancets 33G MISC USE AS INSTRUCTED TO CHECK BLOOD SUGAR 2 TIMES DAILY 200 each 3   Current Facility-Administered Medications on File Prior to Visit  Medication Dose Route Frequency Provider Last Rate Last Admin   0.9 %  sodium chloride infusion  500 mL Intravenous Once Nelida Meuse III, MD       Allergies  Allergen Reactions   Bee Venom Anaphylaxis   Family History  Problem Relation Age of Onset   Coronary artery disease Brother    Coronary artery disease Mother    Colon cancer Mother    Cancer Other        fhx   Diabetes Other        fhx   Hyperlipidemia Other        fhx  Hypertension Other        fhx   Stroke Other        fhx   Colon polyps Neg Hx    Esophageal cancer Neg Hx    Rectal cancer Neg Hx    Stomach cancer Neg Hx     PE: There were no vitals taken for this visit. There is no height or weight on file to calculate BMI.  Wt Readings from Last 3 Encounters:  10/12/21 246 lb (111.6 kg)  07/19/21 248 lb 12.8 oz (112.9 kg)  04/30/21 248 lb (112.5 kg)   Constitutional: overweight, in NAD, in crutches Eyes: PERRLA, EOMI, no exophthalmos ENT: moist  mucous membranes, no thyromegaly, no cervical lymphadenopathy Cardiovascular: RRR, No MRG, + B LE edema. Respiratory: CTA B Musculoskeletal: no deformities, strength intact in all 4 Skin: moist, warm, no rashes Neurological: no tremor with outstretched hands, DTR normal in all 4  ASSESSMENT: 1. DM2, non-insulin-dependent, uncontrolled, with complications - CKD stage 2 - background DR  2.  Obesity class III  3. HL  PLAN:  1.  Patient with longstanding, uncontrolled, type 2 diabetes, on metformin and DPP 4 inhibitor, to which we added back sulfonylurea at last visit after sugars increased even to 300s.  At that time, she was eating [place] grapes.  We discussed about reducing these.  HbA1c at that time was higher, at 7.1%.  She was not checking sugars later in the day and I advised her to start doing so  - I advised her to:  Patient Instructions  Please continue: - Metformin ER 1000 mg 2x a day with meals  - Januvia 100 mg before breakfast - Amaryl 2 mg before dinner  Please return in 4 months with your sugar log.   - we checked her HbA1c: 7%  - advised to check sugars at different times of the day - 1x a day, rotating check times - advised for yearly eye exams >> she is UTD - return to clinic in 4 months  2.  Obesity class III -She continues on metformin and Januvia which are both weight neutral.   -Weight was stable at last visit  3. HL -Reviewed latest lipid panel from 02/2021: LDL above our target of less than 70, otherwise fractions at goal Lab Results  Component Value Date   CHOL 151 03/09/2021   HDL 48.80 03/09/2021   LDLCALC 81 03/09/2021   TRIG 107.0 03/09/2021   CHOLHDL 3 03/09/2021  -Continues Lipitor 40 mg daily without side effects   Philemon Kingdom, MD PhD [facility] Endocrinology

## 2021-12-01 ENCOUNTER — Ambulatory Visit: Payer: Medicare Other | Admitting: Internal Medicine

## 2021-12-01 ENCOUNTER — Encounter: Payer: Self-pay | Admitting: Internal Medicine

## 2021-12-01 ENCOUNTER — Other Ambulatory Visit: Payer: Self-pay

## 2021-12-01 VITALS — BP 130/82 | HR 88 | Ht 64.5 in | Wt 250.4 lb

## 2021-12-01 DIAGNOSIS — E1165 Type 2 diabetes mellitus with hyperglycemia: Secondary | ICD-10-CM

## 2021-12-01 DIAGNOSIS — E669 Obesity, unspecified: Secondary | ICD-10-CM

## 2021-12-01 DIAGNOSIS — E782 Mixed hyperlipidemia: Secondary | ICD-10-CM

## 2021-12-01 LAB — POCT GLYCOSYLATED HEMOGLOBIN (HGB A1C): Hemoglobin A1C: 6.2 % — AB (ref 4.0–5.6)

## 2021-12-01 MED ORDER — GLIMEPIRIDE 1 MG PO TABS: 2.0000 mg | ORAL_TABLET | Freq: Every day | ORAL | 3 refills | Status: DC

## 2021-12-01 NOTE — Progress Notes (Signed)
Patient ID: Krista Benjamin, female   DOB: Nov 20, 1950, 71 y.o.   MRN: [id number]  This visit occurred during the SARS-CoV-2 public health emergency.  Safety protocols were in place, including screening questions prior to the visit, additional usage of staff PPE, and extensive cleaning of exam room while observing appropriate contact time as indicated for disinfecting solutions.   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  Last visit 4.5 months ago. She has [place].   Interim history: She was previously on a "21-day fast": No red meat, sugar, starches.  She did this again in 10/2021. She has increased urination but no blurry vision, nausea, chest pain.  She has joint pain-in the left knee.  At last visit she had a fractured tibia and was in crutches, previously in wheelchair.  Now walking better. Since last visit she was in the ED on 10/05/2021 with hypertensive urgency (systolic BP in the 811B).  Reviewed HbA1c levels: Lab Results  Component Value Date   HGBA1C 7.1 (A) 07/19/2021   HGBA1C 6.1 (A) 11/20/2020   HGBA1C 6.5 (A) 07/24/2020  Prev. 5.9% on 12/2012, previously 6.1%.  Pt is on a regimen of: - Metformin ER 1000 mg 2x a day with meals  - Tradjenta 5 >> Januvia 100 mg before breakfast - per insurance pref. - Amaryl 2 mg before dinner >> at bedtime!  She takes it even if not eating dinner! Other medications tried: Rec'd GLP1 agonist >> Victoza covered >> she was afraid of SEs. We tried Jardiance 10 mg daily in am - added 04/2017 >> yeast inf >> stopped We tried Invokana 100 mg in am >> stopped after 3 days >> nausea, diarrhea We stopped Lantus 10 units qhs. We stopped Actos 45 mg (was on it since 07/2001) Metformin IR 1000 mg bid >> stopped b/c Nausea/Diarrhea She was on Novolog 8 units tid ac, when sugars >140  We stopped Amaryl 4 mg  before breakfast in 11/2020 but had to restart 2/2 high CBGs.Marland Kitchen  Pt checks her sugars 1-2 times a day: - am:  53, 59, 66-113, 156 >> 64-112 >> 104-143, 155, 208 >> 67-116 - 2h after b'fast:  135, 139, 193 >> 189 >> 115 >> n/c >> 130 after coffee - lunch: 135-169 >> 183 >> 71, 82 >> n/c >> 156 >> 79 >> n/c - 2h after lunch: 303 >> n/c >> 201 >> 104 >> n/c - dinner: 171, 178, 195 (steroids) >> n/c >> 62 >> n/c - 2h after dinner: 197, 210, 231  >> 115-173 >> n/c - bedtime: 100, 191 (after grapes) >> 81 >>132, 364 (grapes) >> 134 - night:   76, 137 >> n/c >> 184 >> 125, 128 >> n/c  >> 75 Lowest sugar:  53 >> 104; she has hypoglycemia awareness in the 70s. Highest sugar: 300 (at the time of her fx) >> .Marland KitchenMarland Kitchen191 >> 364.  Glucometer: One Touch Verio Flex  She lost 80 pounds since 2012 >> She was able to come off insulin then. She saw Antonieta Iba (nutritionist) on 01/01/2015.  -+ Mild CKD. Latest BUN/creatinine was:  Lab Results  Component Value Date   BUN 14 10/05/2021   Lab Results  Component Value Date   CREATININE 0.70 10/05/2021  On Entresto.  Lab Results  Component Value Date   MICRALBCREAT 3.4 11/20/2020   MICRALBCREAT 2.3 08/26/2015   MICRALBCREAT 5.5 09/22/2014  MICRALBCREAT 5.4 11/21/2007   -+ HL.  Latest lipid profile: Lab Results  Component Value Date   CHOL 151 03/09/2021   HDL 48.80 03/09/2021   LDLCALC 81 03/09/2021   TRIG 107.0 03/09/2021   CHOLHDL 3 03/09/2021  On Lipitor 40.  - last eye exam: 03/2021: No DR; previous history of background DR; + h/o B cataract sx.  - no numbness and tingling in her legs.  She also has a history of HTN, iron deficiency anemia-on iron therapy, aortic valve disease/murmur, left ventricular hypertrophy per EKG, obesity, multinodular goiter, fibroids.  She had shingles in 11/2014 and 02/2016.  She fell and fractured R wrist 06/2018. She had another, right radial, fracture 01/2019.  ROS: + See HPI  I reviewed pt's medications,  allergies, PMH, social hx, family hx, and changes were documented in the history of present illness. Otherwise, unchanged from my initial visit note.  Past Medical History:  Diagnosis Date   Anemia    Aortic stenosis    Mild   Arthritis    Cataract    both eyes  (cataracts removed)   Diabetes mellitus    sees Dr. Cruzita Lederer    Gynecological examination    sees Dr. Nori Riis    Hyperlipidemia    Hypertension    sees Dr. Marijo File   Stroke [facility])    TIA   Past Surgical History:  Procedure Laterality Date   CARPAL TUNNEL RELEASE  01/22/14   Left Hand    CATARACT EXTRACTION     Lens surgery   COLONOSCOPY  02/06/2018   per Dr. Loletha Carrow, no polyps, repeat in 5 yrs (colon CA in mother)    excision of benign cyst     from left maxilla and left maxillary sinus   KNEE SURGERY     RIGHT/LEFT HEART CATH AND CORONARY ANGIOGRAPHY N/A 09/14/2018   Procedure: RIGHT/LEFT HEART CATH AND CORONARY ANGIOGRAPHY;  Surgeon: Martinique, Peter M, MD;  Location: [place] [facility];  Service: Cardiovascular;  Laterality: N/A;   SHOULDER ARTHROSCOPY W/ ROTATOR CUFF REPAIR Left 09-23-13   per Dr. Devonne Doughty at [facility] center in [place] History   Socioeconomic History   Marital status: Married    Spouse name: Not on file   Number of children: Not on file   Years of education: Not on file   Highest education level: Not on file  Occupational History   Not on file  Tobacco Use   Smoking status: Never   Smokeless tobacco: Never  Vaping Use   Vaping Use: Never used  Substance and Sexual Activity   Alcohol use: No    Alcohol/week: 0.0 standard drinks   Drug use: No   Sexual activity: Not on file  Other Topics Concern   Not on file  Social History Narrative   Not on file   Social Determinants of Health   Financial Resource Strain: Not on file  Food Insecurity: Not on file  Transportation Needs: Not on file  Physical Activity: Not on file  Stress: Not on file  Social  Connections: Not on file  Intimate Partner Violence: Not on file   Current Outpatient Medications on File Prior to Visit  Medication Sig Dispense Refill   albuterol (PROVENTIL) (2.5 MG/3ML) 0.083% nebulizer solution Take 3 mLs (2.5 mg total) by nebulization every 6 (six) hours as needed for wheezing or shortness of breath. 75 mL 12   albuterol (VENTOLIN HFA) 108 (90 Base) MCG/ACT inhaler Inhale 1-2  puffs into the lungs every 4 (four) hours as needed for wheezing or shortness of breath. 1 each 0   Alcohol Swabs (ALCOHOL PREP) 70 % PADS USE TO CHECK BLOOD SUGAR ONCE DAILY 200 each 3   aspirin EC 81 MG tablet Take 81 mg by mouth at bedtime. Swallow whole.     atorvastatin (LIPITOR) 40 MG tablet TAKE 1 TABLET AT BEDTIME 90 tablet 3   azithromycin (ZITHROMAX Z-PAK) 250 MG tablet As directed 6 each 0   Blood Glucose Monitoring Suppl (TRUE METRIX GO GLUCOSE METER) w/Device KIT Use as instructed to check blood sugar 2 times daily 1 kit 0   bumetanide (BUMEX) 1 MG tablet TAKE 2 TABLETS AT BEDTIME 180 tablet 3   cholecalciferol (VITAMIN D) 1000 UNITS tablet Take 2,000 Units by mouth at bedtime.     diclofenac Sodium (VOLTAREN) 1 % GEL Apply 1 application topically 4 (four) times daily. 100 g 0   EPIPEN 2-PAK 0.3 MG/0.3ML SOAJ injection INJECT 0.3 MLS (0.3 MG TOTAL) INTO THE MUSCLE ONCE. 1 Device 0   ferrous sulfate 325 (65 FE) MG EC tablet TAKE 1 TABLET BY MOUTH EVERY DAY 90 tablet 3   Fexofenadine HCl (ALLEGRA PO) Take 1 tablet by mouth daily at 12 noon.     glimepiride (AMARYL) 2 MG tablet Take 1 tablet (2 mg total) by mouth daily before breakfast. 30 tablet 3   glucose blood (TRUE METRIX BLOOD GLUCOSE TEST) test strip USE AS DIRECTED TWICE A DAY 200 strip 3   hydrALAZINE (APRESOLINE) 50 MG tablet TAKE 1 TABLET THREE TIMES A DAY 270 tablet 3   HYDROcodone bit-homatropine (HYCODAN) 5-1.5 MG/5ML syrup Take 5 mLs by mouth every 4 (four) hours as needed for cough. 240 mL 0   HYDROcodone-acetaminophen  (NORCO/VICODIN) 5-325 MG tablet Take 1-2 tablets by mouth every 6 (six) hours as needed for moderate pain or severe pain. 12 tablet 0   isosorbide mononitrate (IMDUR) 120 MG 24 hr tablet TAKE 1 TABLET DAILY 90 tablet 3   JANUVIA 100 MG tablet TAKE 1 TABLET DAILY 90 tablet 3   ketoconazole (NIZORAL) 2 % cream APPLY TO AFFECTED AREA DAILY 30 g 5   metFORMIN (GLUCOPHAGE-XR) 500 MG 24 hr tablet TAKE 2 TABLETS TWICE A DAY WITH MEALS 360 tablet 3   metoprolol succinate (TOPROL-XL) 100 MG 24 hr tablet TAKE 1 TABLET DAILY, TAKE WITH OR IMMEDIATELY FOLLOWING A MEAL 90 tablet 1   mupirocin ointment (BACTROBAN) 2 % Place 1 application into the nose 2 (two) times daily. (Patient taking differently: Apply 1 application topically 2 (two) times daily as needed (wound care).) 22 g 1   polyvinyl alcohol (LIQUIFILM TEARS) 1.4 % ophthalmic solution Place 1 drop into both eyes daily as needed for dry eyes.     sacubitril-valsartan (ENTRESTO) 97-103 MG Take 1 tablet by mouth 2 (two) times daily. 180 tablet 3   TRUEplus Lancets 33G MISC USE AS INSTRUCTED TO CHECK BLOOD SUGAR 2 TIMES DAILY 200 each 3   Current Facility-Administered Medications on File Prior to Visit  Medication Dose Route Frequency Provider Last Rate Last Admin   0.9 %  sodium chloride infusion  500 mL Intravenous Once Doran Stabler, MD       Allergies  Allergen Reactions   Bee Venom Anaphylaxis   Family History  Problem Relation Age of Onset   Coronary artery disease Brother    Coronary artery disease Mother    Colon cancer Mother    Cancer  Other        fhx   Diabetes Other        fhx   Hyperlipidemia Other        fhx   Hypertension Other        fhx   Stroke Other        fhx   Colon polyps Neg Hx    Esophageal cancer Neg Hx    Rectal cancer Neg Hx    Stomach cancer Neg Hx    PE: BP 130/82 (BP Location: Right Arm, Patient Position: Sitting, Cuff Size: Normal)    Pulse 88    Ht 5' 4.5" (1.638 m)    Wt 250 lb 6.4 oz (113.6 kg)     SpO2 97%    BMI 42.32 kg/m    Wt Readings from Last 3 Encounters:  12/01/21 250 lb 6.4 oz (113.6 kg)  10/12/21 246 lb (111.6 kg)  07/19/21 248 lb 12.8 oz (112.9 kg)   Constitutional: overweight, in NAD, in crutches Eyes: PERRLA, EOMI, no exophthalmos ENT: moist mucous membranes, no thyromegaly, no cervical lymphadenopathy Cardiovascular: RRR, No MRG, + B LE edema. Respiratory: CTA B Musculoskeletal: no deformities, strength intact in all 4 Skin: moist, warm, no rashes Neurological: no tremor with outstretched hands, DTR normal in all 4 Diabetic Foot Exam - Simple   Simple Foot Form Diabetic Foot exam was performed with the following findings: Yes 12/01/2021 10:23 AM  Visual Inspection No deformities, no ulcerations, no other skin breakdown bilaterally: Yes Sensation Testing Intact to touch and monofilament testing bilaterally: Yes Pulse Check Posterior Tibialis and Dorsalis pulse intact bilaterally: Yes Comments    ASSESSMENT: 1. DM2, non-insulin-dependent, uncontrolled, with complications - CKD stage 2 - background DR  2.  Obesity class III  3. HL  PLAN:  1.  Patient with longstanding, uncontrolled, type 2 diabetes, on metformin and DPP 4 inhibitor, to which we added back sulfonylurea at last visit after sugars increased up to 300s.  At that time, she was eating [place] grapes.  We discussed about reducing these.  HbA1c was higher, at 7.1%.  She was not checking sugars later in the day and I advised her to start doing so. -At this visit, she tells me that she is taking glimepiride even without eating dinner and she takes it at bedtime.  We discussed that this is supposed to be taken before dinner only if she eats this meal.  Since she is having some lower blood sugars in the morning advised her to only take 1 mg, a 50% dose reduction.  For now we will continue metformin and Januvia. - I advised her to:  Patient Instructions  Please continue: - Metformin ER 1000 mg 2x a  day with meals  - Januvia 100 mg before breakfast  Please change: - Amaryl 1 mg 15 min before dinner  Please return in 4 months with your sugar log.   - we checked her HbA1c: 6.2% (much better) - advised to check sugars at different times of the day - 1x a day, rotating check times - advised for yearly eye exams >> she is UTD -Foot exam performed today - return to clinic in 4 months  2.  Obesity class III -She continues on metformin and Januvia which are both weight neutral.   -Weight was stable at last visit -She gained 2 pounds since last visit  3. HL -Reviewed latest lipid panel from 02/2021: LDL above our goal of less than 70, the rest of  the fractions at goal: Lab Results  Component Value Date   CHOL 151 03/09/2021   HDL 48.80 03/09/2021   LDLCALC 81 03/09/2021   TRIG 107.0 03/09/2021   CHOLHDL 3 03/09/2021  -Continues Lipitor 40 mg daily without side effects  Philemon Kingdom, MD PhD [facility] Endocrinology

## 2021-12-01 NOTE — Patient Instructions (Addendum)
Please continue: - Metformin ER 1000 mg 2x a day with meals  - Januvia 100 mg before breakfast  Please change: - Amaryl 1 mg 15 min before dinner  Please return in 4 months with your sugar log.

## 2022-01-11 ENCOUNTER — Other Ambulatory Visit: Payer: Self-pay | Admitting: Internal Medicine

## 2022-02-07 ENCOUNTER — Other Ambulatory Visit: Payer: Self-pay | Admitting: Cardiology

## 2022-02-07 ENCOUNTER — Telehealth: Payer: Self-pay | Admitting: Family Medicine

## 2022-02-07 NOTE — Telephone Encounter (Signed)
Pt is calling and [facility] her to let her know she can sch covid booster. Pt already  had total of 5 covid vaccine and would like to know if dr fry recommends her getting another booster ?

## 2022-02-07 NOTE — Telephone Encounter (Signed)
Please advise 

## 2022-02-08 NOTE — Telephone Encounter (Signed)
Yes it is recommended that anyone at high risk (which she is) receive a second bivalent vaccine. She should get this some time soon  ?

## 2022-02-08 NOTE — Telephone Encounter (Signed)
Spoke with patient message given.

## 2022-02-17 ENCOUNTER — Other Ambulatory Visit: Payer: Self-pay

## 2022-02-17 ENCOUNTER — Telehealth: Payer: Self-pay

## 2022-02-17 DIAGNOSIS — E1165 Type 2 diabetes mellitus with hyperglycemia: Secondary | ICD-10-CM

## 2022-02-17 MED ORDER — SITAGLIPTIN PHOSPHATE 100 MG PO TABS: 100.0000 mg | ORAL_TABLET | Freq: Every day | ORAL | 0 refills | Status: DC

## 2022-02-17 MED ORDER — METOPROLOL SUCCINATE ER 100 MG PO TB24: ORAL_TABLET | ORAL | 1 refills | Status: DC

## 2022-02-17 NOTE — Telephone Encounter (Signed)
Caller states she needs to get a script called in ?from Dr. Clent Ridges, the pharmacy told her she needs ?a new script altogether. She has a few pills ?remaining and this is a mail order script needing ?filled so she's trying to be ahead of it. Metropolo is ?the medication needing a new script. ? ?02/16/2022 5:07:55 PM Clinical Call Clovis Pu, RN, Rosey Bath ? ?02/17/22 1108 - Pt confirms that Metoprolol refill is needed from [facility]. Review of chart shows she should have 1 3 month refill left. Medication refilled now to [facility] at pt request. Pt verb understanding. ?

## 2022-03-03 NOTE — Progress Notes (Signed)
Cardiology Office Note   Date:  03/04/2022   ID:  Krista Benjamin, DOB Dec 01, 1950, MRN [id number]  PCP:  Krista Morale, MD  Cardiologist:   Krista Breeding, MD   Chief Complaint  Patient presents with   Edema      History of Present Illness: Krista Benjamin is a 71 y.o. female who presents for followup of a mildly reduced  EF 40 - 45%.   She had a cath last year and had minimal CAD.   Her EF was lower at 35 - 40%.     Follow-up echo demonstrated the EF to be up to 50 to 55%.  Since I saw her she was in the emergency room in December and I reviewed these records for this visit.  She actually was there for hypertensive urgency when she was noted to be [facility].  I saw the blood pressure was 200/94.  It came down without treatment.  She did have very minimally elevated troponin and a very mildly elevated BNP.  She is also had knee surgery since I saw her.  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  The patient denies any new symptoms such as chest discomfort, neck or arm discomfort. There has been no new shortness of breath, PND or orthopnea. There have been no reported palpitations, presyncope or syncope.   Past Medical History:  Diagnosis Date   Anemia    Aortic stenosis    Mild   Arthritis    Cataract    both eyes  (cataracts removed)   Diabetes mellitus    sees Dr. Cruzita Lederer    Gynecological examination    sees Dr. Nori Riis    Hyperlipidemia    Hypertension    sees Dr. Marijo File   Stroke [facility])    TIA    Past Surgical History:  Procedure Laterality Date   CARPAL TUNNEL RELEASE  01/22/14   Left Hand    CATARACT EXTRACTION     Lens surgery   COLONOSCOPY  02/06/2018   per Dr. Loletha Carrow, no polyps, repeat in 5 yrs (colon CA in mother)    excision of benign cyst     from left maxilla and left maxillary sinus   KNEE SURGERY     RIGHT/LEFT HEART CATH AND CORONARY ANGIOGRAPHY N/A 09/14/2018   Procedure: RIGHT/LEFT HEART CATH AND  CORONARY ANGIOGRAPHY;  Surgeon: Martinique, Peter M, MD;  Location: [facility];  Service: Cardiovascular;  Laterality: N/A;   SHOULDER ARTHROSCOPY W/ ROTATOR CUFF REPAIR Left 09-23-13   per Dr. Devonne Doughty at [facility] in [place]      Current Outpatient Medications  Medication Sig Dispense Refill   albuterol (PROVENTIL) (2.5 MG/3ML) 0.083% nebulizer solution Take 3 mLs (2.5 mg total) by nebulization every 6 (six) hours as needed for wheezing or shortness of breath. 75 mL 12   albuterol (VENTOLIN HFA) 108 (90 Base) MCG/ACT inhaler Inhale 1-2 puffs into the lungs every 4 (four) hours as needed for wheezing or shortness of breath. 1 each 0   Alcohol Swabs (ALCOHOL PREP) 70 % PADS USE TO CHECK BLOOD SUGAR ONCE DAILY 200 each 3   aspirin EC 81 MG tablet Take 81 mg by mouth at bedtime. Swallow whole.     atorvastatin (LIPITOR) 40 MG tablet TAKE 1 TABLET AT BEDTIME 90 tablet 3   azithromycin (ZITHROMAX Z-PAK) 250 MG tablet As directed 6 each 0   Blood Glucose Monitoring Suppl (  TRUE METRIX GO GLUCOSE METER) w/Device KIT Use as instructed to check blood sugar 2 times daily 1 kit 0   bumetanide (BUMEX) 1 MG tablet TAKE 2 TABLETS AT BEDTIME 180 tablet 3   cholecalciferol (VITAMIN D) 1000 UNITS tablet Take 2,000 Units by mouth at bedtime.     EPIPEN 2-PAK 0.3 MG/0.3ML SOAJ injection INJECT 0.3 MLS (0.3 MG TOTAL) INTO THE MUSCLE ONCE. 1 Device 0   ferrous sulfate 325 (65 FE) MG EC tablet TAKE 1 TABLET BY MOUTH EVERY DAY 90 tablet 3   Fexofenadine HCl (ALLEGRA PO) Take 1 tablet by mouth daily at 12 noon.     glimepiride (AMARYL) 1 MG tablet Take 2 tablets (2 mg total) by mouth daily before breakfast. (Patient taking differently: Take 1 mg by mouth daily before breakfast. Takes 1/2 (half) a tablet daily) 90 tablet 3   glucose blood (TRUE METRIX BLOOD GLUCOSE TEST) test strip USE AS DIRECTED TWICE A DAY 200 strip 3   hydrALAZINE (APRESOLINE) 100 MG tablet Take 1 tablet (100 mg total) by  mouth 3 (three) times daily. 270 tablet 3   HYDROcodone bit-homatropine (HYCODAN) 5-1.5 MG/5ML syrup Take 5 mLs by mouth every 4 (four) hours as needed for cough. 240 mL 0   isosorbide mononitrate (IMDUR) 120 MG 24 hr tablet TAKE 1 TABLET DAILY 90 tablet 3   ketoconazole (NIZORAL) 2 % cream APPLY TO AFFECTED AREA DAILY 30 g 5   metFORMIN (GLUCOPHAGE-XR) 500 MG 24 hr tablet TAKE 2 TABLETS TWICE A DAY WITH MEALS 360 tablet 3   metoprolol succinate (TOPROL-XL) 100 MG 24 hr tablet TAKE 1 TABLET DAILY, TAKE WITH OR IMMEDIATELY FOLLOWING A MEAL 90 tablet 1   polyvinyl alcohol (LIQUIFILM TEARS) 1.4 % ophthalmic solution Place 1 drop into both eyes daily as needed for dry eyes.     sacubitril-valsartan (ENTRESTO) 97-103 MG Take 1 tablet by mouth 2 (two) times daily. 180 tablet 3   sitaGLIPtin (JANUVIA) 100 MG tablet Take 1 tablet (100 mg total) by mouth daily. 7 tablet 0   TRUEplus Lancets 33G MISC USE AS INSTRUCTED TO CHECK BLOOD SUGAR 2 TIMES DAILY 200 each 3   diclofenac Sodium (VOLTAREN) 1 % GEL Apply 1 application topically 4 (four) times daily. (Patient not taking: Reported on 03/04/2022) 100 g 0   HYDROcodone-acetaminophen (NORCO/VICODIN) 5-325 MG tablet Take 1-2 tablets by mouth every 6 (six) hours as needed for moderate pain or severe pain. (Patient not taking: Reported on 03/04/2022) 12 tablet 0   mupirocin ointment (BACTROBAN) 2 % Place 1 application into the nose 2 (two) times daily. (Patient not taking: Reported on 03/04/2022) 22 g 1   No current facility-administered medications for this visit.    Allergies:   Bee venom    ROS:  Please see the history of present illness.   Otherwise, review of systems are positive for none.   All other systems are reviewed and negative.    PHYSICAL EXAM: VS:  BP (!) 156/81 (BP Location: Right Arm, Patient Position: Sitting)   Pulse 65   Ht 5' 3"  (1.6 m)   Wt 255 lb (115.7 kg)   SpO2 96%   BMI 45.17 kg/m  , BMI Body mass index is 45.17 kg/m.   GENERAL:  Well appearing NECK:  No jugular venous distention, waveform within normal limits, carotid upstroke brisk and symmetric, no bruits, no thyromegaly LUNGS:  Clear to auscultation bilaterally CHEST:  Unremarkable HEART:  PMI not displaced or sustained,S1 and S2 within  normal limits, no S3, no S4, no clicks, no rubs, no murmurs ABD:  Flat, positive bowel sounds normal in frequency in pitch, no bruits, no rebound, no guarding, no midline pulsatile mass, no hepatomegaly, no splenomegaly EXT:  2 plus pulses throughout, mild bilateral leg edema, no cyanosis no clubbing   EKG:  EKG is  ordered today. Sinus rhythm, rate 60, left bundle branch block, left axis deviation, no change from previous   Recent Labs: 03/09/2021: ALT 10; TSH 1.10 10/05/2021: B Natriuretic Peptide 195.1; BUN 14; Creatinine, Ser 0.70; Hemoglobin 11.5; Platelets 232; Potassium 4.0; Sodium 138    Lipid Panel    Component Value Date/Time   CHOL 151 03/09/2021 1131   TRIG 107.0 03/09/2021 1131   HDL 48.80 03/09/2021 1131   CHOLHDL 3 03/09/2021 1131   VLDL 21.4 03/09/2021 1131   LDLCALC 81 03/09/2021 1131   LDLCALC 90 05/11/2020 1147      Wt Readings from Last 3 Encounters:  03/04/22 255 lb (115.7 kg)  12/01/21 250 lb 6.4 oz (113.6 kg)  10/12/21 246 lb (111.6 kg)      Other studies Reviewed: Additional studies/ records that were reviewed today include: ED records. Review of the above records demonstrates: See elsewhere   ASSESSMENT AND PLAN:   CARDIOMYOPATHY:   Her ejection fraction was previously improved.  She seems to be euvolemic.  No further imaging.  HTN:  Her blood pressure is elevated today and given hypertensive urgency I suspect that it is elevated more often than I would know.  And I will increase her hydralazine to 300 mg 3 times daily.    DM:   Her A1c is 6.2.  No change in therapy.  6.5 which is down from 7.7.  She is working with Dr. Cruzita Lederer.     Current medicines are reviewed at  length with the patient today.  The patient does not have concerns regarding medicines.  The following changes have been made: As above  Labs/ tests ordered today include: None  Orders Placed This Encounter  Procedures   EKG 12-Lead     Disposition:   Follow up 12 months.     Signed, Krista Breeding, MD  03/04/2022 1:09 PM    [facility]

## 2022-03-04 ENCOUNTER — Ambulatory Visit (INDEPENDENT_AMBULATORY_CARE_PROVIDER_SITE_OTHER): Payer: Medicare Other | Admitting: Cardiology

## 2022-03-04 ENCOUNTER — Encounter: Payer: Self-pay | Admitting: Cardiology

## 2022-03-04 VITALS — BP 156/81 | HR 65 | Ht 63.0 in | Wt 255.0 lb

## 2022-03-04 DIAGNOSIS — E118 Type 2 diabetes mellitus with unspecified complications: Secondary | ICD-10-CM | POA: Diagnosis not present

## 2022-03-04 DIAGNOSIS — I420 Dilated cardiomyopathy: Secondary | ICD-10-CM | POA: Diagnosis not present

## 2022-03-04 DIAGNOSIS — I10 Essential (primary) hypertension: Secondary | ICD-10-CM

## 2022-03-04 MED ORDER — HYDRALAZINE HCL 100 MG PO TABS: 100.0000 mg | ORAL_TABLET | Freq: Three times a day (TID) | ORAL | 3 refills | Status: DC

## 2022-03-04 NOTE — Patient Instructions (Signed)
Medication Instructions:  Your physician has recommended you make the following change in your medication:  INCREASE: Hydralazine 100 mg three times a day  *If you need a refill on your cardiac medications before your next appointment, please call your pharmacy*   Lab Work: None If you have labs (blood work) drawn today and your tests are completely normal, you will receive your results only by: MyChart Message (if you have MyChart) OR A paper copy in the mail If you have any lab test that is abnormal or we need to change your treatment, we will call you to review the results.   Testing/Procedures: None   Follow-Up: At [facility], you and your health needs are our priority.  As part of our continuing mission to provide you with exceptional heart care, we have created designated Provider Care Teams.  These Care Teams include your primary Cardiologist (physician) and Advanced Practice Providers (APPs -  Physician Assistants and Nurse Practitioners) who all work together to provide you with the care you need, when you need it.  We recommend signing up for the patient portal called "MyChart".  Sign up information is provided on this After Visit Summary.  MyChart is used to connect with patients for Virtual Visits (Telemedicine).  Patients are able to view lab/test results, encounter notes, upcoming appointments, etc.  Non-urgent messages can be sent to your provider as well.   To learn more about what you can do with MyChart, go to [email or web address].    Your next appointment:   1 year(s)  The format for your next appointment:   In Person  Provider:   Rollene Rotunda, MD     Other Instructions   Important Information About Sugar

## 2022-03-08 ENCOUNTER — Encounter: Payer: Self-pay | Admitting: Cardiology

## 2022-03-08 ENCOUNTER — Telehealth: Payer: Self-pay | Admitting: Cardiology

## 2022-03-08 MED ORDER — ENTRESTO 97-103 MG PO TABS: 1.0000 | ORAL_TABLET | Freq: Two times a day (BID) | ORAL | 0 refills | Status: DC

## 2022-03-08 NOTE — Telephone Encounter (Signed)
[facility]

## 2022-03-08 NOTE — Telephone Encounter (Signed)
*  STAT* If patient is at the pharmacy, call can be transferred to refill team.   1. Which medications need to be refilled? (please list name of each medication and dose if known) new prescription for Entresto until her mail order comes  2. Which pharmacy/location (including street and city if local pharmacy) is medication to be sent to?[facility]  [place], [place]  3. Do they need a 30 day or 90 day supply? 30 days- need today- completely out of medicine    *STAT* If patient is at the pharmacy, call can be transferred to refill team.   1. Which medications need to be refilled? (please list name of each medication and dose if known) New prescription for Entresto  2. Which pharmacy/location (including street and city if local pharmacy) is medication to be sent to? [facility]  3. Do they need a 30 day or 90 day supply? 90 days and refill

## 2022-03-09 ENCOUNTER — Other Ambulatory Visit: Payer: Self-pay

## 2022-03-09 MED ORDER — ENTRESTO 97-103 MG PO TABS: 1.0000 | ORAL_TABLET | Freq: Two times a day (BID) | ORAL | 0 refills | Status: DC

## 2022-03-14 ENCOUNTER — Other Ambulatory Visit: Payer: Self-pay | Admitting: Internal Medicine

## 2022-03-14 DIAGNOSIS — E1165 Type 2 diabetes mellitus with hyperglycemia: Secondary | ICD-10-CM

## 2022-03-20 ENCOUNTER — Other Ambulatory Visit: Payer: Self-pay | Admitting: Family Medicine

## 2022-03-21 MED ORDER — TRUE METRIX BLOOD GLUCOSE TEST VI STRP: ORAL_STRIP | 3 refills | Status: AC

## 2022-03-21 NOTE — Addendum Note (Signed)
Addended by: Kenyon Ana on: 03/21/2022 10:44 AM   Modules accepted: Orders

## 2022-04-01 ENCOUNTER — Ambulatory Visit (INDEPENDENT_AMBULATORY_CARE_PROVIDER_SITE_OTHER): Payer: Medicare Other | Admitting: Family Medicine

## 2022-04-01 ENCOUNTER — Encounter: Payer: Self-pay | Admitting: Family Medicine

## 2022-04-01 ENCOUNTER — Other Ambulatory Visit: Payer: Self-pay | Admitting: Cardiology

## 2022-04-01 VITALS — BP 130/80 | HR 70 | Temp 97.6°F | Ht 63.0 in | Wt 250.0 lb

## 2022-04-01 DIAGNOSIS — E1165 Type 2 diabetes mellitus with hyperglycemia: Secondary | ICD-10-CM

## 2022-04-01 DIAGNOSIS — I10 Essential (primary) hypertension: Secondary | ICD-10-CM

## 2022-04-01 DIAGNOSIS — E1122 Type 2 diabetes mellitus with diabetic chronic kidney disease: Secondary | ICD-10-CM | POA: Diagnosis not present

## 2022-04-01 DIAGNOSIS — E782 Mixed hyperlipidemia: Secondary | ICD-10-CM

## 2022-04-01 DIAGNOSIS — N182 Chronic kidney disease, stage 2 (mild): Secondary | ICD-10-CM

## 2022-04-01 DIAGNOSIS — D508 Other iron deficiency anemias: Secondary | ICD-10-CM

## 2022-04-01 DIAGNOSIS — I420 Dilated cardiomyopathy: Secondary | ICD-10-CM

## 2022-04-01 DIAGNOSIS — E1140 Type 2 diabetes mellitus with diabetic neuropathy, unspecified: Secondary | ICD-10-CM | POA: Insufficient documentation

## 2022-04-01 LAB — LIPID PANEL
Cholesterol: 159 mg/dL (ref 0–200)
HDL: 52.5 mg/dL (ref >39.00)
LDL Cholesterol: 80 mg/dL (ref 0–99)
NonHDL: 106.29
Total CHOL/HDL Ratio: 3
Triglycerides: 131 mg/dL (ref 0.0–149.0)
VLDL: 26.2 mg/dL (ref 0.0–40.0)

## 2022-04-01 LAB — BASIC METABOLIC PANEL
BUN: 31 mg/dL — ABNORMAL HIGH (ref 6–23)
CO2: 32 mEq/L (ref 19–32)
Calcium: 10.2 mg/dL (ref 8.4–10.5)
Chloride: 101 mEq/L (ref 96–112)
Creatinine, Ser: 1.17 mg/dL (ref 0.40–1.20)
GFR: 46.8 mL/min — ABNORMAL LOW (ref >60.00)
Glucose, Bld: 73 mg/dL (ref 70–99)
Potassium: 4.4 mEq/L (ref 3.5–5.1)
Sodium: 142 mEq/L (ref 135–145)

## 2022-04-01 LAB — CBC WITH DIFFERENTIAL/PLATELET
Basophils Absolute: 0 10*3/uL (ref 0.0–0.1)
Basophils Relative: 0.4 % (ref 0.0–3.0)
Eosinophils Absolute: 0.1 10*3/uL (ref 0.0–0.7)
Eosinophils Relative: 1 % (ref 0.0–5.0)
HCT: 34.7 % — ABNORMAL LOW (ref 36.0–46.0)
Hemoglobin: 11 g/dL — ABNORMAL LOW (ref 12.0–15.0)
Lymphocytes Relative: 29.7 % (ref 12.0–46.0)
Lymphs Abs: 2.8 10*3/uL (ref 0.7–4.0)
MCHC: 31.8 g/dL (ref 30.0–36.0)
MCV: 78.8 fl (ref 78.0–100.0)
Monocytes Absolute: 0.6 10*3/uL (ref 0.1–1.0)
Monocytes Relative: 6.8 % (ref 3.0–12.0)
Neutro Abs: 5.9 10*3/uL (ref 1.4–7.7)
Neutrophils Relative %: 62.1 % (ref 43.0–77.0)
Platelets: 239 10*3/uL (ref 150.0–400.0)
RBC: 4.4 Mil/uL (ref 3.87–5.11)
RDW: 15.6 % — ABNORMAL HIGH (ref 11.5–15.5)
WBC: 9.4 10*3/uL (ref 4.0–10.5)

## 2022-04-01 LAB — HEPATIC FUNCTION PANEL
ALT: 10 U/L (ref 0–35)
AST: 17 U/L (ref 0–37)
Albumin: 4.4 g/dL (ref 3.5–5.2)
Alkaline Phosphatase: 61 U/L (ref 39–117)
Bilirubin, Direct: 0.1 mg/dL (ref 0.0–0.3)
Total Bilirubin: 0.4 mg/dL (ref 0.2–1.2)
Total Protein: 8.2 g/dL (ref 6.0–8.3)

## 2022-04-01 LAB — HEMOGLOBIN A1C: Hgb A1c MFr Bld: 7.2 % — ABNORMAL HIGH (ref 4.6–6.5)

## 2022-04-01 LAB — TSH: TSH: 1.92 u[IU]/mL (ref 0.35–5.50)

## 2022-04-01 MED ORDER — SITAGLIPTIN PHOSPHATE 100 MG PO TABS: 100.0000 mg | ORAL_TABLET | Freq: Every day | ORAL | 0 refills | Status: DC

## 2022-04-01 MED ORDER — FERROUS GLUCONATE 324 (38 FE) MG PO TABS: 324.0000 mg | ORAL_TABLET | Freq: Every day | ORAL | 0 refills | Status: DC

## 2022-04-01 MED ORDER — FERROUS GLUCONATE 324 (38 FE) MG PO TABS: 324.0000 mg | ORAL_TABLET | Freq: Every day | ORAL | 3 refills | Status: DC

## 2022-04-21 ENCOUNTER — Ambulatory Visit: Payer: Medicare Other | Admitting: Internal Medicine

## 2022-04-21 ENCOUNTER — Encounter: Payer: Self-pay | Admitting: Internal Medicine

## 2022-04-21 VITALS — BP 148/88 | HR 80 | Ht 63.0 in | Wt 253.4 lb

## 2022-04-21 DIAGNOSIS — E782 Mixed hyperlipidemia: Secondary | ICD-10-CM

## 2022-04-21 DIAGNOSIS — E1165 Type 2 diabetes mellitus with hyperglycemia: Secondary | ICD-10-CM

## 2022-04-21 DIAGNOSIS — E669 Obesity, unspecified: Secondary | ICD-10-CM

## 2022-04-21 MED ORDER — SITAGLIPTIN PHOSPHATE 100 MG PO TABS: 100.0000 mg | ORAL_TABLET | Freq: Every day | ORAL | 3 refills | Status: DC

## 2022-04-21 NOTE — Progress Notes (Signed)
Patient ID: Krista Benjamin, female   DOB: 1951-09-29, 71 y.o.   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  Last visit 4.5 months ago. She has [place].   Interim history: She was previously on a "21-day fast": No red meat, sugar, starches.  She did this again in 10/2021. She gained 3 husband pounds since last visit. She does have increased urination but no blurry vision, nausea, chest pain.  She has left knee pain - improved.  Reviewed HbA1c levels: Lab Results  Component Value Date   HGBA1C 7.2 (H) 04/01/2022   HGBA1C 6.2 (A) 12/01/2021   HGBA1C 7.1 (A) 07/19/2021  Prev. 5.9% on 12/2012, previously 6.1%.  Pt is on a regimen of: - Metformin ER 1000 mg 2x a day with meals  - Tradjenta 5 >> Januvia 100 mg before breakfast - per insurance pref. - Amaryl 2 mg before dinner >> but was taking it at bedtime >> 1 mg before dinner (does not take this before Glucerna) Other medications tried: Rec'd GLP1 agonist >> Victoza covered >> she was afraid of SEs. We tried Jardiance 10 mg daily in am - added 04/2017 >> yeast inf >> stopped We tried Invokana 100 mg in am >> stopped after 3 days >> nausea, diarrhea We stopped Lantus 10 units qhs. We stopped Actos 45 mg (was on it since 07/2001) Metformin IR 1000 mg bid >> stopped b/c Nausea/Diarrhea She was on Novolog 8 units tid ac, when sugars >140  We stopped Amaryl 4 mg before breakfast in 11/2020 but had to restart 2/2 high CBGs.Marland Kitchen  Pt checks her sugars 1-2 times a day: - am:  53, 59, 66-113, 156 >> 64-112 >> 104-143, 155, 208 >> 67-116 >> 63, 80-123, 123 - 2h after b'fast:  135, 139, 193 >> 189 >> 115 >> n/c >> 130 after coffee >> n/c - lunch: 135-169 >> 183 >> 71, 82 >> n/c >> 156 >> 79 >> n/c - 2h after lunch: 303 >> n/c >> 201 >> 104 >> n/c - dinner: 171, 178, 195 (steroids) >> n/c  >> 62 >> n/c - 2h after dinner: 197, 210, 231  >> 115-173 >> n/c - bedtime: 100, 191 (after grapes) >> 81 >>132, 364 (grapes) >> 134 >> 107, 211 - night:   76, 137 >> n/c >> 184 >> 125, 128 >> n/c  >> 75 >> n/c Lowest sugar:  53 >> 104; she has hypoglycemia awareness in the 70s. Highest sugar: 300 (at the time of her fx) >> .Marland KitchenMarland Kitchen191 >> 364 >> 211  Glucometer: One Touch Verio Flex  She lost 80 pounds since 2012 >> She was able to come off insulin then. She saw Antonieta Iba (nutritionist) on 01/01/2015.  -+ Mild CKD. Latest BUN/creatinine was:  Lab Results  Component Value Date   BUN 31 (H) 04/01/2022   Lab Results  Component Value Date   CREATININE 1.17 04/01/2022  On Entresto.  Lab Results  Component Value Date   MICRALBCREAT 3.4 11/20/2020   MICRALBCREAT 2.3 08/26/2015   MICRALBCREAT 5.5 09/22/2014   MICRALBCREAT 5.4 11/21/2007   -+ HL.  Latest lipid profile: Lab Results  Component Value Date   CHOL 159 04/01/2022   HDL 52.50 04/01/2022   LDLCALC 80 04/01/2022   TRIG 131.0 04/01/2022   CHOLHDL 3 04/01/2022  On Lipitor 40.  - last eye exam: 03/2021: No  DR; previous history of background DR; + h/o B cataract sx.  - no numbness and tingling in her legs.  Last foot exam 12/01/2021.  She also has a history of HTN, iron deficiency anemia-on iron therapy, aortic valve disease/murmur, left ventricular hypertrophy per EKG, obesity, multinodular goiter, fibroids.  She had shingles in 11/2014 and 02/2016.  She fell and fractured R wrist 06/2018. She had another, right radial, fracture 01/2019. She was in the ED on 10/05/2021 with hypertensive urgency (systolic BP in the 161W).  ROS: + See HPI  I reviewed pt's medications, allergies, PMH, social hx, family hx, and changes were documented in the history of present illness. Otherwise, unchanged from my initial visit note.  Past Medical History:  Diagnosis Date   Anemia    Aortic stenosis    Mild   Arthritis    Cataract     both eyes  (cataracts removed)   Diabetes mellitus    sees Dr. Cruzita Lederer    Gynecological examination    sees Dr. Nori Riis    Hyperlipidemia    Hypertension    sees Dr. Marijo File   Stroke [facility])    TIA   Past Surgical History:  Procedure Laterality Date   CARPAL TUNNEL RELEASE  01/22/14   Left Hand    CATARACT EXTRACTION     Lens surgery   COLONOSCOPY  02/06/2018   per Dr. Loletha Carrow, no polyps, repeat in 5 yrs (colon CA in mother)    excision of benign cyst     from left maxilla and left maxillary sinus   KNEE SURGERY     RIGHT/LEFT HEART CATH AND CORONARY ANGIOGRAPHY N/A 09/14/2018   Procedure: RIGHT/LEFT HEART CATH AND CORONARY ANGIOGRAPHY;  Surgeon: Martinique, Peter M, MD;  Location: [facility];  Service: Cardiovascular;  Laterality: N/A;   SHOULDER ARTHROSCOPY W/ ROTATOR CUFF REPAIR Left 09-23-13   per Dr. Devonne Doughty at [facility] center in [place] History   Socioeconomic History   Marital status: Married    Spouse name: Not on file   Number of children: Not on file   Years of education: Not on file   Highest education level: Not on file  Occupational History   Not on file  Tobacco Use   Smoking status: Never   Smokeless tobacco: Never  Vaping Use   Vaping Use: Never used  Substance and Sexual Activity   Alcohol use: No    Alcohol/week: 0.0 standard drinks of alcohol   Drug use: No   Sexual activity: Not on file  Other Topics Concern   Not on file  Social History Narrative   Not on file   Social Determinants of Health   Financial Resource Strain: Low Risk  (07/22/2020)   Overall Financial Resource Strain (CARDIA)    Difficulty of Paying Living Expenses: Not hard at all  Food Insecurity: No Food Insecurity (07/22/2020)   Hunger Vital Sign    Worried About Running Out of Food in the Last Year: Never true    [place] in the Last Year: Never true  Transportation Needs: No Transportation Needs (07/22/2020)   PRAPARE - Armed forces logistics/support/administrative officer (Medical): No    Lack of Transportation (Non-Medical): No  Physical Activity: Inactive (07/22/2020)   Exercise Vital Sign    Days of Exercise per Week: 0 days    Minutes of Exercise per Session: 0 min  Stress: No Stress Concern Present (07/22/2020)  [place]    Feeling of Stress : Not at all  Social Connections: Socially Integrated (07/22/2020)   Social Connection and Isolation Panel [NHANES]    Frequency of Communication with Friends and Family: More than three times a week    Frequency of Social Gatherings with Friends and Family: More than three times a week    Attends Religious Services: More than 4 times per year    Active Member of [employer] or Organizations: Yes    Attends Music therapist: More than 4 times per year    Marital Status: Married  Human resources officer Violence: Not At Risk (07/22/2020)   Humiliation, Afraid, Rape, and Kick questionnaire    Fear of Current or Ex-Partner: No    Emotionally Abused: No    Physically Abused: No    Sexually Abused: No   Current Outpatient Medications on File Prior to Visit  Medication Sig Dispense Refill   albuterol (PROVENTIL) (2.5 MG/3ML) 0.083% nebulizer solution Take 3 mLs (2.5 mg total) by nebulization every 6 (six) hours as needed for wheezing or shortness of breath. 75 mL 12   albuterol (VENTOLIN HFA) 108 (90 Base) MCG/ACT inhaler Inhale 1-2 puffs into the lungs every 4 (four) hours as needed for wheezing or shortness of breath. 1 each 0   Alcohol Swabs (ALCOHOL PREP) 70 % PADS USE TO CHECK BLOOD SUGAR ONCE DAILY 200 each 3   aspirin EC 81 MG tablet Take 81 mg by mouth at bedtime. Swallow whole.     atorvastatin (LIPITOR) 40 MG tablet TAKE 1 TABLET AT BEDTIME 90 tablet 3   Blood Glucose Monitoring Suppl (TRUE METRIX GO GLUCOSE METER) w/Device KIT Use as instructed to check blood sugar 2 times daily 1 kit 0   bumetanide (BUMEX) 1  MG tablet TAKE 2 TABLETS AT BEDTIME 180 tablet 3   cholecalciferol (VITAMIN D) 1000 UNITS tablet Take 2,000 Units by mouth at bedtime.     diclofenac Sodium (VOLTAREN) 1 % GEL Apply 1 application topically 4 (four) times daily. 100 g 0   EPIPEN 2-PAK 0.3 MG/0.3ML SOAJ injection INJECT 0.3 MLS (0.3 MG TOTAL) INTO THE MUSCLE ONCE. 1 Device 0   ferrous gluconate (FERGON) 324 MG tablet Take 1 tablet (324 mg total) by mouth daily. 90 tablet 3   Fexofenadine HCl (ALLEGRA PO) Take 1 tablet by mouth daily at 12 noon.     glimepiride (AMARYL) 1 MG tablet Take 2 tablets (2 mg total) by mouth daily before breakfast. (Patient taking differently: Take 1 mg by mouth daily before breakfast. Takes 1/2 (half) a tablet daily) 90 tablet 3   glucose blood (TRUE METRIX BLOOD GLUCOSE TEST) test strip USE AS INSTRUCTED TO CHECK BLOOD SUGAR TWICE DAILY E11.65 200 strip 3   hydrALAZINE (APRESOLINE) 100 MG tablet Take 1 tablet (100 mg total) by mouth 3 (three) times daily. 270 tablet 3   HYDROcodone bit-homatropine (HYCODAN) 5-1.5 MG/5ML syrup Take 5 mLs by mouth every 4 (four) hours as needed for cough. 240 mL 0   HYDROcodone-acetaminophen (NORCO/VICODIN) 5-325 MG tablet Take 1-2 tablets by mouth every 6 (six) hours as needed for moderate pain or severe pain. 12 tablet 0   isosorbide mononitrate (IMDUR) 120 MG 24 hr tablet TAKE 1 TABLET DAILY 90 tablet 3   ketoconazole (NIZORAL) 2 % cream APPLY TO AFFECTED AREA DAILY 30 g 5   metFORMIN (GLUCOPHAGE-XR) 500 MG 24 hr tablet TAKE 2 TABLETS TWICE A DAY WITH  MEALS 360 tablet 3   metoprolol succinate (TOPROL-XL) 100 MG 24 hr tablet TAKE 1 TABLET DAILY, TAKE WITH OR IMMEDIATELY FOLLOWING A MEAL 90 tablet 1   mupirocin ointment (BACTROBAN) 2 % Place 1 application into the nose 2 (two) times daily. 22 g 1   polyvinyl alcohol (LIQUIFILM TEARS) 1.4 % ophthalmic solution Place 1 drop into both eyes daily as needed for dry eyes.     sacubitril-valsartan (ENTRESTO) 97-103 MG Take 1 tablet  by mouth 2 (two) times daily. 90 tablet 0   sitaGLIPtin (JANUVIA) 100 MG tablet Take 1 tablet (100 mg total) by mouth daily. 30 tablet 0   TRUEplus Lancets 33G MISC USE AS INSTRUCTED TO CHECK BLOOD SUGAR 2 TIMES DAILY 200 each 3   No current facility-administered medications on file prior to visit.   Allergies  Allergen Reactions   Bee Venom Anaphylaxis   Family History  Problem Relation Age of Onset   Coronary artery disease Brother    Coronary artery disease Mother    Colon cancer Mother    Cancer Other        fhx   Diabetes Other        fhx   Hyperlipidemia Other        fhx   Hypertension Other        fhx   Stroke Other        fhx   Colon polyps Neg Hx    Esophageal cancer Neg Hx    Rectal cancer Neg Hx    Stomach cancer Neg Hx    PE: BP (!) 148/88 (BP Location: Left Arm, Patient Position: Sitting, Cuff Size: Normal)   Pulse 80   Ht _0  (1.6 m)   Wt 253 lb 6.4 oz (114.9 kg)   SpO2 95%   BMI 44.89 kg/m    Wt Readings from Last 3 Encounters:  04/21/22 253 lb 6.4 oz (114.9 kg)  04/01/22 250 lb (113.4 kg)  03/04/22 255 lb (115.7 kg)   Constitutional: overweight, in NAD, in crutches Eyes: PERRLA, EOMI, no exophthalmos ENT: moist mucous membranes, no thyromegaly, no cervical lymphadenopathy Cardiovascular: RRR, No MRG, + B LE edema. Respiratory: CTA B Musculoskeletal: no deformities Skin: moist, warm, no rashes Neurological: no tremor with outstretched hands  ASSESSMENT: 1. DM2, non-insulin-dependent, uncontrolled, with complications - CKD stage 2 - background DR  2.  Obesity class III  3. HL  PLAN:  1.  Patient with longstanding, uncontrolled, type 2 diabetes, on metformin and DPP 4 inhibitor, to which we added back sulfonylurea in 2022, after sugars increased after 300s.  At that time, she was eating [place] [employer] and I advised her to reduce these.  HbA1c improved afterwards and it was 6.2% at last visit.  She was taking glimepiride at that time at  that time and I advised her to move this before dinner and to reduce the dose to only 1 mg due to having some low blood sugars in the morning. -However, she had another HbA1c level obtained last month and this was higher, at 7.2%. -her sugars in the morning appear to be well controlled, but she is not taking consistently later in the day to understand trends. Upon Q'ing, she is taking a lower dose of Amaryl than recommended, cutting the 1 mg tablet in half and only takes it before solid dinners, not before Glucerna.  We discussed about taking 1 mg of Amaryl before dinner and also before Glucerna, but she may need only help  for smaller meals.  We will continue the rest of the regimen. - I advised her to:  Patient Instructions  Please continue: - Metformin ER 1000 mg 2x a day with meals  - Januvia 100 mg before breakfast - Amaryl 0.5-1 mg 15 min before dinner and Glucerna  Please return in 4 months with your sugar log.   - advised to check sugars at different times of the day - 1x a day, rotating check times - advised for yearly eye exams >> she is not UTD but plans to schedule this - return to clinic in 4 months  2.  Obesity class III -She continues on metformin and Januvia which are both weight neutral.  We use a low-dose of Amaryl for her (this can be weight inducing). -She gained 2 pounds before last visit; weight was 250 pounds at that time -Weight is 3 pounds higher at this visit  3. HL -Reviewed latest lipid panel from 03/2022: LDL above our target of less than 70, otherwise, fractions at goal: Lab Results  Component Value Date   CHOL 159 04/01/2022   HDL 52.50 04/01/2022   LDLCALC 80 04/01/2022   TRIG 131.0 04/01/2022   CHOLHDL 3 04/01/2022  -Continues Lipitor 40 mg daily without side effects  Philemon Kingdom, MD PhD [facility]

## 2022-04-21 NOTE — Patient Instructions (Addendum)
Please continue: - Metformin ER 1000 mg 2x a day with meals  - Januvia 100 mg before breakfast - Amaryl 0.5-1 mg 15 min before dinner and Glucerna  Please return in 4 months with your sugar log.

## 2022-04-24 ENCOUNTER — Ambulatory Visit (HOSPITAL_COMMUNITY)
Admission: EM | Admit: 2022-04-24 | Discharge: 2022-04-24 | Disposition: A | Discharge status: Home / Self Care | Payer: Medicare Other | Attending: Internal Medicine | Admitting: Internal Medicine

## 2022-04-24 ENCOUNTER — Encounter (HOSPITAL_COMMUNITY): Payer: Self-pay

## 2022-04-24 DIAGNOSIS — M7918 Myalgia, other site: Secondary | ICD-10-CM

## 2022-04-24 MED ORDER — METHOCARBAMOL 500 MG PO TABS: 500.0000 mg | ORAL_TABLET | Freq: Two times a day (BID) | ORAL | 0 refills | Status: DC | PRN

## 2022-04-24 NOTE — ED Triage Notes (Signed)
Patient presents to Urgent Care with complaints of L low back and hip pain since Friday when she was making the bed. Patient reports pain was better when she took hot shower no otc medication for concern it may interact with prescription.

## 2022-04-24 NOTE — ED Provider Notes (Signed)
[facility]    CSN: [id number] Arrival date & time: 04/24/22  1136      History   Chief Complaint Chief Complaint  Patient presents with   Hip Pain    HPI Krista Benjamin is a 71 y.o. female.   Patient presents with left buttocks pain that radiates down posterior leg that started about 3 days ago.  Patient reports that she was making up her bed when pain subsequently started after that.  Pain has been intermittent since this occurred.  She has not taken any medications for pain but has taken a hot shower.  Denies any numbness or tingling.  Denies urinary frequency, urinary or bowel continence, saddle anesthesia.  She does have a history of sciatica as well.   Hip Pain    Past Medical History:  Diagnosis Date   Anemia    Aortic stenosis    Mild   Arthritis    Cataract    both eyes  (cataracts removed)   Diabetes mellitus    sees Dr. Cruzita Lederer    Gynecological examination    sees Dr. Nori Riis    Hyperlipidemia    Hypertension    sees Dr. Marijo File   Stroke [facility])    TIA    Patient Active Problem List   Diagnosis Date Noted   Type 2 diabetes mellitus with diabetic neuropathy, unspecified ([place]) 04/01/2022   Educated about COVID-19 virus infection 02/26/2020   Cervical strain 01/24/2019   Dilated cardiomyopathy ([place]) 09/14/2018   Nonrheumatic aortic valve stenosis 08/12/2018   CKD stage 2 due to type 2 diabetes mellitus ([place]) 04/27/2017   AORTIC VALVE DISORDERS 04/26/2010   Obesity, unspecified 05/07/2009   MURMUR 05/07/2009   CHEST PAIN 05/07/2009   LOW BACK PAIN 01/09/2008   ANEMIA-IRON DEFICIENCY 11/21/2007   Weakness 11/21/2007   Mixed hyperlipidemia 10/26/2007   Essential hypertension 10/26/2007    Past Surgical History:  Procedure Laterality Date   CARPAL TUNNEL RELEASE  01/22/14   Left Hand    CATARACT EXTRACTION     Lens surgery   COLONOSCOPY  02/06/2018   per Dr. Loletha Carrow, no polyps, repeat in 5 yrs (colon CA in mother)    excision  of benign cyst     from left maxilla and left maxillary sinus   KNEE SURGERY     RIGHT/LEFT HEART CATH AND CORONARY ANGIOGRAPHY N/A 09/14/2018   Procedure: RIGHT/LEFT HEART CATH AND CORONARY ANGIOGRAPHY;  Surgeon: Martinique, Peter M, MD;  Location: [place] [facility];  Service: Cardiovascular;  Laterality: N/A;   SHOULDER ARTHROSCOPY W/ ROTATOR CUFF REPAIR Left 09-23-13   per Dr. Devonne Doughty at [facility] in [place] History   No obstetric history on file.      Home Medications    Prior to Admission medications   Medication Sig Start Date End Date Taking? Authorizing Provider  methocarbamol (ROBAXIN) 500 MG tablet Take 1 tablet (500 mg total) by mouth 2 (two) times daily as needed for muscle spasms. 04/24/22  Yes Rajean Desantiago, Hildred Alamin E, FNP  albuterol (PROVENTIL) (2.5 MG/3ML) 0.083% nebulizer solution Take 3 mLs (2.5 mg total) by nebulization every 6 (six) hours as needed for wheezing or shortness of breath. 10/05/21   Sponseller, Eugene Garnet R, PA-C  albuterol (VENTOLIN HFA) 108 (90 Base) MCG/ACT inhaler Inhale 1-2 puffs into the lungs every 4 (four) hours as needed for wheezing or shortness of breath. 10/05/21   Sponseller, Gypsy Balsam, PA-C  Alcohol Swabs (ALCOHOL PREP)  70 % PADS USE TO CHECK BLOOD SUGAR ONCE DAILY 01/11/21   Philemon Kingdom, MD  aspirin EC 81 MG tablet Take 81 mg by mouth at bedtime. Swallow whole.    [provider]  atorvastatin (LIPITOR) 40 MG tablet TAKE 1 TABLET AT BEDTIME 05/03/21   Laurey Morale, MD  Blood Glucose Monitoring Suppl (TRUE METRIX GO GLUCOSE METER) w/Device KIT Use as instructed to check blood sugar 2 times daily 03/09/21   Philemon Kingdom, MD  bumetanide (BUMEX) 1 MG tablet TAKE 2 TABLETS AT BEDTIME 08/11/21   Laurey Morale, MD  cholecalciferol (VITAMIN D) 1000 UNITS tablet Take 2,000 Units by mouth at bedtime.    [provider]  diclofenac Sodium (VOLTAREN) 1 % GEL Apply 1 application topically 4 (four) times daily.  12/27/20   Melynda Ripple, MD  EPIPEN 2-PAK 0.3 MG/0.3ML SOAJ injection INJECT 0.3 MLS (0.3 MG TOTAL) INTO THE MUSCLE ONCE. 04/19/16   Laurey Morale, MD  ferrous gluconate (FERGON) 324 MG tablet Take 1 tablet (324 mg total) by mouth daily. 04/01/22   Laurey Morale, MD  Fexofenadine HCl (ALLEGRA PO) Take 1 tablet by mouth daily at 12 noon.    [provider]  glimepiride (AMARYL) 1 MG tablet Take 2 tablets (2 mg total) by mouth daily before breakfast. Patient taking differently: Take 1 mg by mouth daily before breakfast. Takes 1/2 (half) a tablet daily 12/01/21   Philemon Kingdom, MD  glucose blood (TRUE METRIX BLOOD GLUCOSE TEST) test strip USE AS INSTRUCTED TO CHECK BLOOD SUGAR TWICE DAILY E11.65 03/21/22   Philemon Kingdom, MD  hydrALAZINE (APRESOLINE) 100 MG tablet Take 1 tablet (100 mg total) by mouth 3 (three) times daily. 03/04/22   Minus Breeding, MD  HYDROcodone bit-homatropine (HYCODAN) 5-1.5 MG/5ML syrup Take 5 mLs by mouth every 4 (four) hours as needed for cough. 10/12/21   Laurey Morale, MD  HYDROcodone-acetaminophen (NORCO/VICODIN) 5-325 MG tablet Take 1-2 tablets by mouth every 6 (six) hours as needed for moderate pain or severe pain. 12/27/20   Melynda Ripple, MD  isosorbide mononitrate (IMDUR) 120 MG 24 hr tablet TAKE 1 TABLET DAILY 04/01/22   Minus Breeding, MD  ketoconazole (NIZORAL) 2 % cream APPLY TO AFFECTED AREA DAILY 04/14/21   Laurey Morale, MD  metFORMIN (GLUCOPHAGE-XR) 500 MG 24 hr tablet TAKE 2 TABLETS TWICE A DAY WITH MEALS 10/12/21   Philemon Kingdom, MD  metoprolol succinate (TOPROL-XL) 100 MG 24 hr tablet TAKE 1 TABLET DAILY, TAKE WITH OR IMMEDIATELY FOLLOWING A MEAL 02/17/22   Laurey Morale, MD  mupirocin ointment (BACTROBAN) 2 % Place 1 application into the nose 2 (two) times daily. 12/07/18   Laurey Morale, MD  polyvinyl alcohol (LIQUIFILM TEARS) 1.4 % ophthalmic solution Place 1 drop into both eyes daily as needed for dry eyes.    [provider]   sacubitril-valsartan (ENTRESTO) 97-103 MG Take 1 tablet by mouth 2 (two) times daily. 03/09/22   Minus Breeding, MD  sitaGLIPtin (JANUVIA) 100 MG tablet Take 1 tablet (100 mg total) by mouth daily. 04/21/22   Philemon Kingdom, MD  TRUEplus Lancets 33G MISC USE AS INSTRUCTED TO CHECK BLOOD SUGAR 2 TIMES DAILY 03/09/21   Philemon Kingdom, MD    Family History Family History  Problem Relation Age of Onset   Coronary artery disease Brother    Coronary artery disease Mother    Colon cancer Mother    Cancer Other        fhx  Diabetes Other        fhx   Hyperlipidemia Other        fhx   Hypertension Other        fhx   Stroke Other        fhx   Colon polyps Neg Hx    Esophageal cancer Neg Hx    Rectal cancer Neg Hx    Stomach cancer Neg Hx     Social History Social History   Tobacco Use   Smoking status: Never   Smokeless tobacco: Never  Vaping Use   Vaping Use: Never used  Substance Use Topics   Alcohol use: No    Alcohol/week: 0.0 standard drinks of alcohol   Drug use: No     Allergies   Bee venom   Review of Systems Review of Systems Per HPI  Physical Exam Triage Vital Signs ED Triage Vitals  Enc Vitals Group     BP 04/24/22 1204 (!) 169/81     Pulse Rate 04/24/22 1204 82     Resp 04/24/22 1204 (!) 22     Temp 04/24/22 1204 98.3 F (36.8 C)     Temp src --      SpO2 04/24/22 1204 94 %     Weight --      Height --      Head Circumference --      Peak Flow --      Pain Score 04/24/22 1203 10     Pain Loc --      Pain Edu? --      Excl. in [place]? --    No data found.  Updated Vital Signs BP (!) 169/81   Pulse 82   Temp 98.3 F (36.8 C)   Resp (!) 22   SpO2 94%   Visual Acuity Right Eye Distance:   Left Eye Distance:   Bilateral Distance:    Right Eye Near:   Left Eye Near:    Bilateral Near:     Physical Exam Constitutional:      General: She is not in acute distress.    Appearance: Normal appearance. She is not toxic-appearing or  diaphoretic.  HENT:     Head: Normocephalic and atraumatic.  Eyes:     Extraocular Movements: Extraocular movements intact.     Conjunctiva/sclera: Conjunctivae normal.  Pulmonary:     Effort: Pulmonary effort is normal.  Musculoskeletal:       Back:     Comments: Tenderness to palpation to left buttocks that extends into left posterior thigh directly above the knee.  No obvious swelling, discoloration, lacerations, abrasions noted.  No deformity noted.  No lower back pain with palpation.  No direct spinal tenderness, crepitus, step-off noted.  Unable to assess straight leg raise given patient cooperation.  Neurological:     General: No focal deficit present.     Mental Status: She is alert and oriented to person, place, and time. Mental status is at baseline.     Deep Tendon Reflexes: Reflexes are normal and symmetric.  Psychiatric:        Mood and Affect: Mood normal.        Behavior: Behavior normal.        Thought Content: Thought content normal.        Judgment: Judgment normal.      UC Treatments / Results  Labs (all labs ordered are listed, but only abnormal results are displayed) Labs Reviewed - No data to display  EKG   Radiology No results found.  Procedures Procedures (including critical care time)  Medications Ordered in UC Medications - No data to display  Initial Impression / Assessment and Plan / UC Course  I have reviewed the triage vital signs and the nursing notes.  Pertinent labs & imaging results that were available during my care of the patient were reviewed by me and considered in my medical decision making (see chart for details).     Differential diagnoses include piriformis syndrome versus muscle strain.  Will treat with muscle relaxer as patient reports that she has taken this before and has tolerated well.  Advised patient that it can cause drowsiness and do not drive while taking this medication.  Patient does not appear to take any other  sedating medications so this should be safe.  Will avoid prednisone given patient's most recent EF of 40 to 45%.  Patient also reports that she has been told that she cannot take NSAIDs but is not sure why.  Patient advised to supplement with Tylenol as she states that she is able to tolerate Tylenol.  Patient also advised of supportive care and alternating ice and heat.  Patient advised to follow-up if symptoms persist or worsen.  Patient verbalized understanding and was agreeable with plan. Final Clinical Impressions(s) / UC Diagnoses   Final diagnoses:  Piriformis muscle pain     Discharge Instructions      I suspect that you have strained a muscle or you are having nerve pain.  This is being treated with muscle relaxer.  You may also supplement with Tylenol.  Alternate ice and heat to affected area.  Follow-up if symptoms persist or worsen.    ED Prescriptions     Medication Sig Dispense Auth. Provider   methocarbamol (ROBAXIN) 500 MG tablet Take 1 tablet (500 mg total) by mouth 2 (two) times daily as needed for muscle spasms. 20 tablet [employer], Michele Rockers, [place]      PDMP not reviewed this encounter.   Teodora Medici, [place] 04/24/22 1242

## 2022-04-24 NOTE — Discharge Instructions (Signed)
I suspect that you have strained a muscle or you are having nerve pain.  This is being treated with muscle relaxer.  You may also supplement with Tylenol.  Alternate ice and heat to affected area.  Follow-up if symptoms persist or worsen.

## 2022-04-25 ENCOUNTER — Ambulatory Visit: Payer: Medicare Other | Admitting: Family Medicine

## 2022-04-25 ENCOUNTER — Ambulatory Visit (INDEPENDENT_AMBULATORY_CARE_PROVIDER_SITE_OTHER): Payer: Medicare Other

## 2022-04-25 ENCOUNTER — Encounter: Payer: Self-pay | Admitting: Family Medicine

## 2022-04-25 VITALS — BP 138/80 | HR 65 | Temp 99.2°F | Wt 246.0 lb

## 2022-04-25 DIAGNOSIS — M545 Low back pain, unspecified: Secondary | ICD-10-CM

## 2022-04-25 MED ORDER — METHYLPREDNISOLONE 4 MG PO TBPK: ORAL_TABLET | ORAL | 0 refills | Status: DC

## 2022-04-25 NOTE — Progress Notes (Signed)
   Subjective:    Patient ID: Krista Benjamin, female    DOB: [birth year], 71 y.o.   MRN: [id number]  HPI Here to follow up an urgent care visit yesterday for severe left sided low back pain that radiates down the left leg. No recent trauma. She has also had numbness in the entire left leg including the foot. This started suddenly after she made her bed 4 days ago. At the urgent care, this was diagnosed as a piriformis syndrome ,and she was given Methocarbamol to take. She has been taking this plus Tylenol, as well as standing in a hot shower, but nothing has given her much relief.    Review of Systems  Constitutional: Negative.   Respiratory: Negative.    Cardiovascular: Negative.   Musculoskeletal:  Positive for back pain.       Objective:   Physical Exam Constitutional:      Comments: In a wheelchair  Cardiovascular:     Rate and Rhythm: Normal rate and regular rhythm.     Pulses: Normal pulses.     Heart sounds: Normal heart sounds.  Pulmonary:     Effort: Pulmonary effort is normal.     Breath sounds: Normal breath sounds.  Musculoskeletal:     Comments: She is quite tender over the lower spine, and even more so over the left sciatic notch. ROM is limited by pain. SLR is positive on both sides.   Neurological:     Mental Status: She is alert.           Assessment & Plan:  Left sided low back pain, this is more consistent with sciatica pain. She will try a Medrol dose pack along with Tylenol. Get Xrays of the lumbar spine today. We spent a total of ( 35  ) minutes reviewing records and discussing these issues.   Gershon Crane, MD

## 2022-04-26 ENCOUNTER — Other Ambulatory Visit: Payer: Self-pay | Admitting: Family Medicine

## 2022-04-28 MED ORDER — DICLOFENAC SODIUM 75 MG PO TBEC: 75.0000 mg | DELAYED_RELEASE_TABLET | Freq: Two times a day (BID) | ORAL | 5 refills | Status: DC | PRN

## 2022-04-28 NOTE — Addendum Note (Signed)
Addended by: Gershon Crane A on: 04/28/2022 07:29 AM   Modules accepted: Orders

## 2022-05-02 ENCOUNTER — Ambulatory Visit (INDEPENDENT_AMBULATORY_CARE_PROVIDER_SITE_OTHER): Payer: Medicare Other

## 2022-05-02 VITALS — Ht 63.0 in | Wt 239.0 lb

## 2022-05-02 DIAGNOSIS — Z0000 Encounter for general adult medical examination without abnormal findings: Secondary | ICD-10-CM | POA: Diagnosis not present

## 2022-05-02 NOTE — Progress Notes (Signed)
Subjective:   Tallula Grindle is a 71 y.o. female who presents for Medicare Annual (Subsequent) preventive examination.  Review of Systems    Virtual Visit via Telephone Note  I connected with  Diksha Tagliaferro on 05/02/22 at  1:00 PM EDT by telephone and verified that I am speaking with the correct person using two identifiers.  Location: Patient: Home Provider: Office Persons participating in the virtual visit: patient/Nurse Health Advisor   I discussed the limitations, risks, security and privacy concerns of performing an evaluation and management service by telephone and the availability of in person appointments. The patient expressed understanding and agreed to proceed.  Interactive audio and video telecommunications were attempted between this nurse and patient, however failed, due to patient having technical difficulties OR patient did not have access to video capability.  We continued and completed visit with audio only.  Some vital signs may be absent or patient reported.   Criselda Peaches, LPN  Cardiac Risk Factors include: advanced age (>46mn, >>60women);diabetes mellitus;hypertension     Objective:    Today's Vitals   05/02/22 1321 05/02/22 1322  Weight: 239 lb (108.4 kg)   Height: 5' 3"  (1.6 m)   PainSc:  0-No pain   Body mass index is 42.34 kg/m.     05/02/2022    1:31 PM 07/22/2020    1:23 PM 07/12/2020   12:55 PM 09/14/2018    6:12 AM 02/06/2018    7:34 AM 01/01/2015    8:30 AM  Advanced Directives  Does Patient Have a Medical Advance Directive? No No No No No No  Would patient like information on creating a medical advance directive? No - Patient declined No - Patient declined No - Patient declined No - Patient declined No - Patient declined Yes - Educational materials given    Current Medications (verified) Outpatient Encounter Medications as of 05/02/2022  Medication Sig   albuterol (PROVENTIL) (2.5 MG/3ML) 0.083% nebulizer solution Take 3  mLs (2.5 mg total) by nebulization every 6 (six) hours as needed for wheezing or shortness of breath.   albuterol (VENTOLIN HFA) 108 (90 Base) MCG/ACT inhaler Inhale 1-2 puffs into the lungs every 4 (four) hours as needed for wheezing or shortness of breath.   Alcohol Swabs (ALCOHOL PREP) 70 % PADS USE TO CHECK BLOOD SUGAR ONCE DAILY   aspirin EC 81 MG tablet Take 81 mg by mouth at bedtime. Swallow whole.   atorvastatin (LIPITOR) 40 MG tablet TAKE 1 TABLET AT BEDTIME   Blood Glucose Monitoring Suppl (TRUE METRIX GO GLUCOSE METER) w/Device KIT Use as instructed to check blood sugar 2 times daily   bumetanide (BUMEX) 1 MG tablet TAKE 2 TABLETS AT BEDTIME   cholecalciferol (VITAMIN D) 1000 UNITS tablet Take 2,000 Units by mouth at bedtime.   diclofenac (VOLTAREN) 75 MG EC tablet Take 1 tablet (75 mg total) by mouth 2 (two) times daily as needed for moderate pain.   diclofenac Sodium (VOLTAREN) 1 % GEL Apply 1 application topically 4 (four) times daily.   EPIPEN 2-PAK 0.3 MG/0.3ML SOAJ injection INJECT 0.3 MLS (0.3 MG TOTAL) INTO THE MUSCLE ONCE.   ferrous gluconate (FERGON) 324 MG tablet Take 1 tablet (324 mg total) by mouth daily.   Fexofenadine HCl (ALLEGRA PO) Take 1 tablet by mouth daily at 12 noon.   glimepiride (AMARYL) 1 MG tablet Take 2 tablets (2 mg total) by mouth daily before breakfast. (Patient taking differently: Take 1 mg by mouth daily before breakfast. Takes 1/2 (  half) a tablet daily)   glucose blood (TRUE METRIX BLOOD GLUCOSE TEST) test strip USE AS INSTRUCTED TO CHECK BLOOD SUGAR TWICE DAILY E11.65   hydrALAZINE (APRESOLINE) 100 MG tablet Take 1 tablet (100 mg total) by mouth 3 (three) times daily.   HYDROcodone bit-homatropine (HYCODAN) 5-1.5 MG/5ML syrup Take 5 mLs by mouth every 4 (four) hours as needed for cough.   HYDROcodone-acetaminophen (NORCO/VICODIN) 5-325 MG tablet Take 1-2 tablets by mouth every 6 (six) hours as needed for moderate pain or severe pain.   isosorbide  mononitrate (IMDUR) 120 MG 24 hr tablet TAKE 1 TABLET DAILY   ketoconazole (NIZORAL) 2 % cream APPLY TO AFFECTED AREA DAILY   metFORMIN (GLUCOPHAGE-XR) 500 MG 24 hr tablet TAKE 2 TABLETS TWICE A DAY WITH MEALS   methocarbamol (ROBAXIN) 500 MG tablet Take 1 tablet (500 mg total) by mouth 2 (two) times daily as needed for muscle spasms.   methylPREDNISolone (MEDROL DOSEPAK) 4 MG TBPK tablet As directed   metoprolol succinate (TOPROL-XL) 100 MG 24 hr tablet TAKE 1 TABLET DAILY, TAKE WITH OR IMMEDIATELY FOLLOWING A MEAL   mupirocin ointment (BACTROBAN) 2 % Place 1 application into the nose 2 (two) times daily.   polyvinyl alcohol (LIQUIFILM TEARS) 1.4 % ophthalmic solution Place 1 drop into both eyes daily as needed for dry eyes.   sacubitril-valsartan (ENTRESTO) 97-103 MG Take 1 tablet by mouth 2 (two) times daily.   sitaGLIPtin (JANUVIA) 100 MG tablet Take 1 tablet (100 mg total) by mouth daily.   TRUEplus Lancets 33G MISC USE AS INSTRUCTED TO CHECK BLOOD SUGAR 2 TIMES DAILY   No facility-administered encounter medications on file as of 05/02/2022.    Allergies (verified) Bee venom   History: Past Medical History:  Diagnosis Date   Anemia    Aortic stenosis    Mild   Arthritis    Cataract    both eyes  (cataracts removed)   Diabetes mellitus    sees Dr. Cruzita Lederer    Gynecological examination    sees Dr. Nori Riis    Hyperlipidemia    Hypertension    sees Dr. Marijo File   Stroke [facility])    TIA   Past Surgical History:  Procedure Laterality Date   CARPAL TUNNEL RELEASE  01/22/14   Left Hand    CATARACT EXTRACTION     Lens surgery   COLONOSCOPY  02/06/2018   per Dr. Loletha Carrow, no polyps, repeat in 5 yrs (colon CA in mother)    excision of benign cyst     from left maxilla and left maxillary sinus   KNEE SURGERY     RIGHT/LEFT HEART CATH AND CORONARY ANGIOGRAPHY N/A 09/14/2018   Procedure: RIGHT/LEFT HEART CATH AND CORONARY ANGIOGRAPHY;  Surgeon: Martinique, Peter M, MD;  Location: [place] [facility];  Service: Cardiovascular;  Laterality: N/A;   SHOULDER ARTHROSCOPY W/ ROTATOR CUFF REPAIR Left 09-23-13   per Dr. Devonne Doughty at [facility] center in [place]    Family History  Problem Relation Age of Onset   Coronary artery disease Brother    Coronary artery disease Mother    Colon cancer Mother    Cancer Other        fhx   Diabetes Other        fhx   Hyperlipidemia Other        fhx   Hypertension Other        fhx   Stroke Other        fhx  Colon polyps Neg Hx    Esophageal cancer Neg Hx    Rectal cancer Neg Hx    Stomach cancer Neg Hx    Social History   Socioeconomic History   Marital status: Married    Spouse name: Not on file   Number of children: Not on file   Years of education: Not on file   Highest education level: Not on file  Occupational History   Not on file  Tobacco Use   Smoking status: Never   Smokeless tobacco: Never  Vaping Use   Vaping Use: Never used  Substance and Sexual Activity   Alcohol use: No    Alcohol/week: 0.0 standard drinks of alcohol   Drug use: No   Sexual activity: Not on file  Other Topics Concern   Not on file  Social History Narrative   Not on file   Social Determinants of Health   Financial Resource Strain: Low Risk  (05/02/2022)   Overall Financial Resource Strain (CARDIA)    Difficulty of Paying Living Expenses: Not hard at all  Food Insecurity: No Food Insecurity (05/02/2022)   Hunger Vital Sign    Worried About Running Out of Food in the Last Year: Never true    Ran Out of Food in the Last Year: Never true  Transportation Needs: No Transportation Needs (05/02/2022)   PRAPARE - Hydrologist (Medical): No    Lack of Transportation (Non-Medical): No  Physical Activity: Inactive (05/02/2022)   Exercise Vital Sign    Days of Exercise per Week: 0 days    Minutes of Exercise per Session: 0 min  Stress: No Stress Concern Present (05/02/2022)   [place]    Feeling of Stress : Not at all  Social Connections: [place] (05/02/2022)   Social Connection and Isolation Panel [NHANES]    Frequency of Communication with Friends and Family: More than three times a week    Frequency of Social Gatherings with Friends and Family: More than three times a week    Attends Religious Services: More than 4 times per year    Active Member of [employer] or Organizations: Yes    Attends Music therapist: More than 4 times per year    Marital Status: Married    Tobacco Counseling Counseling given: Not Answered   Clinical Intake:  Pre-visit preparation completed: NoNutrition Risk Assessment:  Has the patient had any N/V/D within the last 2 months?  No  Does the patient have any non-healing wounds?  No  Has the patient had any unintentional weight loss or weight gain?  No   Diabetes:  Is the patient diabetic?  Yes  If diabetic, was a CBG obtained today?  Yes  Did the patient bring in their glucometer from home?  No  How often do you monitor your CBG's? Daily.   Financial Strains and Diabetes Management:  Are you having any financial strains with the device, your supplies or your medication? No .  Does the patient want to be seen by Chronic Care Management for management of their diabetes?  No  Would the patient like to be referred to a Nutritionist or for Diabetic Management?  No   Diabetic Exams:  Diabetic Eye Exam: Completed No. Overdue for diabetic eye exam. Pt has been advised about the importance in completing this exam. A referral has been placed today. Message sent to referral coordinator for scheduling purposes. Advised  pt to expect a call from office referred to regarding appt.  Diabetic Foot Exam: Completed No. Pt has been advised about the importance in completing this exam. Pt is scheduled for diabetic foot exam on Followed by PCP.     Diabetic?   Yes    Activities of Daily Living    05/02/2022    1:28 PM  In your present state of health, do you have any difficulty performing the following activities:  Hearing? 0  Vision? 0  Difficulty concentrating or making decisions? 0  Walking or climbing stairs? 1  Comment Uses walker, cane and w/c prn  Dressing or bathing? 0  Doing errands, shopping? 0  Preparing Food and eating ? N  Using the Toilet? N  In the past six months, have you accidently leaked urine? N  Do you have problems with loss of bowel control? N  Managing your Medications? N  Managing your Finances? N  Housekeeping or managing your Housekeeping? N    Patient Care Team: Laurey Morale, MD as PCP - Cheral Bay, MD as PCP - Cardiology (Cardiology) Charlotte Crumb, MD as Consulting Physician (Orthopedic Surgery)  Indicate any recent Medical Services you may have received from other than [facility] providers in the past year (date may be approximate).     Assessment:   This is a routine wellness examination for Rosey.  Hearing/Vision screen Hearing Screening - Comments:: No hearing difficulty Vision Screening - Comments:: Wears glasses  Dietary issues and exercise activities discussed: Exercise limited by: None identified   Goals Addressed               This Visit's Progress     No current goals (pt-stated)         Depression Screen    05/02/2022    1:26 PM 04/25/2022    4:37 PM 04/01/2022    9:23 AM 07/22/2020    1:25 PM 04/25/2019    8:56 AM 04/20/2018    8:21 AM 11/25/2017   10:10 AM  PHQ 2/9 Scores  PHQ - 2 Score 0 0 0 0 0 0 0  PHQ- 9 Score 0 0 0 0       Fall Risk    05/02/2022    1:30 PM 04/25/2022    4:36 PM 04/01/2022    9:23 AM 03/09/2021   10:35 AM 07/22/2020    1:24 PM  [place] in the past year? 0 0 0 1 0  Number falls in past yr: 0 0 0 0 0  Injury with Fall? 0 0 0 1 0  Comment    fructure wrist   Risk for fall due to : No Fall Risks No Fall Risks No Fall  Risks  No Fall Risks  Follow up  Falls evaluation completed Falls evaluation completed  Falls evaluation completed;Falls prevention discussed    FALL RISK PREVENTION PERTAINING TO THE HOME:  Any stairs in or around the home? Yes  If so, are there any without handrails? No  Home free of loose throw rugs in walkways, pet beds, electrical cords, etc? Yes  Adequate lighting in your home to reduce risk of falls? Yes   ASSISTIVE DEVICES UTILIZED TO PREVENT FALLS:  Life alert? Yes  Use of a cane, walker or w/c? Yes  Grab bars in the bathroom? No  Shower chair or bench in shower? Yes  Elevated toilet seat or a handicapped toilet? Yes   TIMED UP AND GO:  Was the  test performed? No . Audio Visit   Cognitive Function:        05/02/2022    1:31 PM 07/22/2020    1:28 PM  6CIT Screen  What Year? 0 points 0 points  What month? 0 points 0 points  What time? 0 points 0 points  Count back from 20 0 points 0 points  Months in reverse 0 points 0 points  Repeat phrase 0 points 0 points  Total Score 0 points 0 points    Immunizations Immunization History  Administered Date(s) Administered   Fluad Quad(high Dose 65+) 06/27/2019, 07/15/2020, 07/14/2021   Influenza Split 07/07/2011   Influenza Whole 07/05/2010   Influenza, High Dose Seasonal PF 06/26/2015, 08/01/2017, 07/26/2018   Influenza, Seasonal, Injecte, Preservative Fre 06/02/2016   Influenza,inj,Quad PF,6+ Mos 07/15/2013, 06/23/2014   PFIZER(Purple Top)SARS-COV-2 Vaccination 11/21/2019, 12/16/2019, 08/08/2020   Pneumococcal Conjugate-13 07/29/2016   Pneumococcal Polysaccharide-23 10/11/2007   Tdap 10/01/2014   Zoster Recombinat (Shingrix) 03/23/2020, 05/26/2020   Zoster, Live 12/27/2012    TDAP status: Up to date  Flu Vaccine status: Up to date    Covid-19 vaccine status: Completed vaccines  Qualifies for Shingles Vaccine? Yes   Zostavax completed Yes   Shingrix Completed?: Yes  Screening Tests Health Maintenance   Topic Date Due   OPHTHALMOLOGY EXAM  03/29/2022   COVID-19 Vaccine (4 - Booster for Pfizer series) 05/18/2022 (Originally 10/03/2020)   Pneumonia Vaccine 32+ Years old (3 - PPSV23 or PCV20) 04/09/2023 (Originally 07/29/2017)   INFLUENZA VACCINE  05/10/2022   HEMOGLOBIN A1C  10/01/2022   FOOT EXAM  12/01/2022   MAMMOGRAM  12/02/2022   COLONOSCOPY (Pts 45-19yr Insurance coverage will need to be confirmed)  02/07/2023   TETANUS/TDAP  10/01/2024   DEXA SCAN  Completed   Hepatitis C Screening  Completed   Zoster Vaccines- Shingrix  Completed   HPV VACCINES  Aged Out   Fecal DNA (Cologuard)  Discontinued    Health Maintenance  Health Maintenance Due  Topic Date Due   OPHTHALMOLOGY EXAM  03/29/2022    Colorectal cancer screening: Type of screening: Cologuard. Completed 04/03/17. Repeat every   years  Mammogram status: Completed 12/02/20. Repeat every year  Bone Density status: Completed 08/01/16. Results reflect: Bone density results: NORMAL. Repeat every   years.  Lung Cancer Screening: (Low Dose CT Chest recommended if Age 72-80years, 30 pack-year currently smoking OR have quit w/in 15years.) does not qualify.     Additional Screening:  Hepatitis C Screening: does qualify; Completed 07/2016  Vision Screening: Recommended annual ophthalmology exams for early detection of glaucoma and other disorders of the eye. Is the patient up to date with their annual eye exam?  Yes  Who is the provider or what is the name of the office in which the patient attends annual eye exams? Patient deferred If pt is not established with a provider, would they like to be referred to a provider to establish care? No .   Dental Screening: Recommended annual dental exams for proper oral hygiene  Community Resource Referral / Chronic Care Management:  CRR required this visit?  No   CCM required this visit?  No      Plan:     I have personally reviewed and noted the following in the patient's  chart:   Medical and social history Use of alcohol, tobacco or illicit drugs  Current medications and supplements including opioid prescriptions.  Functional ability and status Nutritional status Physical activity Advanced directives List of other  physicians Hospitalizations, surgeries, and ER visits in previous 12 months Vitals Screenings to include cognitive, depression, and falls Referrals and appointments  In addition, I have reviewed and discussed with patient certain preventive protocols, quality metrics, and best practice recommendations. A written personalized care plan for preventive services as well as general preventive health recommendations were provided to patient.     Criselda Peaches, LPN   03/26/8371   Nurse Notes: Patient request medical  letter to permanently be excused from [facility]

## 2022-05-02 NOTE — Patient Instructions (Addendum)
Krista Benjamin , Thank you for taking time to come for your Medicare Wellness Visit. I appreciate your ongoing commitment to your health goals. Please review the following plan we discussed and let me know if I can assist you in the future.   These are the goals we discussed:  Goals       Exercise 150 minutes per week (moderate activity)      [facility] without [facility];       No current goals (pt-stated)        This is a list of the screening recommended for you and due dates:  Health Maintenance  Topic Date Due   Eye exam for diabetics  03/29/2022   COVID-19 Vaccine (4 - Booster for Pfizer series) 05/18/2022*   Pneumonia Vaccine (3 - PPSV23 or PCV20) 04/09/2023*   Flu Shot  05/10/2022   Hemoglobin A1C  10/01/2022   Complete foot exam   12/01/2022   Mammogram  12/02/2022   Colon Cancer Screening  02/07/2023   Tetanus Vaccine  10/01/2024   DEXA scan (bone density measurement)  Completed   Hepatitis C Screening: USPSTF Recommendation to screen - Ages 28-79 yo.  Completed   Zoster (Shingles) Vaccine  Completed   HPV Vaccine  Aged Out   Cologuard (Stool DNA test)  Discontinued  *Topic was postponed. The date shown is not the original due date.    Opioid Pain Medicine Management Opioids are powerful medicines that are used to treat moderate to severe pain. When used for short periods of time, they can help you to: Sleep better. Do better in physical or occupational therapy. Feel better in the first few days after an injury. Recover from surgery. Opioids should be taken with the supervision of a trained health care provider. They should be taken for the shortest period of time possible. This is because opioids can be addictive, and the longer you take opioids, the greater your risk of addiction. This addiction can also be called opioid use disorder. What are the risks? Using opioid pain medicines for longer than 3 days increases  your risk of side effects. Side effects include: Constipation. Nausea and vomiting. Breathing difficulties (respiratory depression). Drowsiness. Confusion. Opioid use disorder. Itching. Taking opioid pain medicine for a long period of time can affect your ability to do daily tasks. It also puts you at risk for: Motor vehicle crashes. Depression. Suicide. Heart attack. Overdose, which can be life-threatening. What is a pain treatment plan? A pain treatment plan is an agreement between you and your health care provider. Pain is unique to each person, and treatments vary depending on your condition. To manage your pain, you and your health care provider need to work together. To help you do this: Discuss the goals of your treatment, including how much pain you might expect to have and how you will manage the pain. Review the risks and benefits of taking opioid medicines. Remember that a good treatment plan uses more than one approach and minimizes the chance of side effects. Be honest about the amount of medicines you take and about any drug or alcohol use. Get pain medicine prescriptions from only one health care provider. Pain can be managed with many types of alternative treatments. Ask your health care provider to refer you to one or more specialists who can help you manage pain through: Physical or occupational therapy. Counseling (cognitive behavioral therapy). Good nutrition. Biofeedback. Massage. Meditation. Non-opioid medicine. Following  a gentle exercise program. How to use opioid pain medicine Taking medicine Take your pain medicine exactly as told by your health care provider. Take it only when you need it. If your pain gets less severe, you may take less than your prescribed dose if your health care provider approves. If you are not having pain, do nottake pain medicine unless your health care provider tells you to take it. If your pain is severe, do nottry to treat it  yourself by taking more pills than instructed on your prescription. Contact your health care provider for help. Write down the times when you take your pain medicine. It is easy to become confused while on pain medicine. Writing the time can help you avoid overdose. Take other over-the-counter or prescription medicines only as told by your health care provider. Keeping yourself and others safe  While you are taking opioid pain medicine: Do not drive, use machinery, or power tools. Do not sign legal documents. Do not drink alcohol. Do not take sleeping pills. Do not supervise children by yourself. Do not do activities that require climbing or being in high places. Do not go to a lake, river, ocean, spa, or swimming pool. Do not share your pain medicine with anyone. Keep pain medicine in a locked cabinet or in a secure area where pets and children cannot reach it. Stopping your use of opioids If you have been taking opioid medicine for more than a few weeks, you may need to slowly decrease (taper) how much you take until you stop completely. Tapering your use of opioids can decrease your risk of symptoms of withdrawal, such as: Pain and cramping in the abdomen. Nausea. Sweating. Sleepiness. Restlessness. Uncontrollable shaking (tremors). Cravings for the medicine. Do not attempt to taper your use of opioids on your own. Talk with your health care provider about how to do this. Your health care provider may prescribe a step-down schedule based on how much medicine you are taking and how long you have been taking it. Getting rid of leftover pills Do not save any leftover pills. Get rid of leftover pills safely by: Taking the medicine to a prescription take-back program. This is usually offered by the county or law enforcement. Bringing them to a pharmacy that has a drug disposal container. Flushing them down the toilet. Check the label or package insert of your medicine to see whether this  is safe to do. Throwing them out in the trash. Check the label or package insert of your medicine to see whether this is safe to do. If it is safe to throw it out, remove the medicine from the original container, put it into a sealable bag or container, and mix it with used coffee grounds, food scraps, dirt, or cat litter before putting it in the trash. Follow these instructions at home: Activity Do exercises as told by your health care provider. Avoid activities that make your pain worse. Return to your normal activities as told by your health care provider. Ask your health care provider what activities are safe for you. General instructions You may need to take these actions to prevent or treat constipation: Drink enough fluid to keep your urine pale yellow. Take over-the-counter or prescription medicines. Eat foods that are high in fiber, such as beans, whole grains, and fresh fruits and vegetables. Limit foods that are high in fat and processed sugars, such as fried or sweet foods. Keep all follow-up visits. This is important. Where to find support If you  have been taking opioids for a long time, you may benefit from receiving support for quitting from a local support group or counselor. Ask your health care provider for a referral to these resources in your area. Where to find more information Centers for Disease Control and Prevention (CDC): FootballExhibition.com.br [place] Food and Drug Administration (FDA): PumpkinSearch.com.ee Get help right away if: You may have taken too much of an opioid (overdosed). Common symptoms of an overdose: Your breathing is slower or more shallow than normal. You have a very slow heartbeat (pulse). You have slurred speech. You have nausea and vomiting. Your pupils become very small. You have other potential symptoms: You are very confused. You faint or feel like you will faint. You have cold, clammy skin. You have blue lips or fingernails. You have thoughts of harming  yourself or harming others. These symptoms may represent a serious problem that is an emergency. Do not wait to see if the symptoms will go away. Get medical help right away. Call your local emergency services (911 in the [place] Do not drive yourself to the hospital.  If you ever feel like you may hurt yourself or others, or have thoughts about taking your own life, get help right away. Go to your nearest emergency department or: Call your local emergency services (911 in the [place] Call the [facility] (941-033-9704 in the [place] Call a suicide crisis helpline, such as the National Suicide Prevention Lifeline at (219) 780-6129 or 988 in the [place] This is open 24 hours a day in the [place] Text the Crisis Text Line at 5108763104 (in the [place] Summary Opioid medicines can help you manage moderate to severe pain for a short period of time. A pain treatment plan is an agreement between you and your health care provider. Discuss the goals of your treatment, including how much pain you might expect to have and how you will manage the pain. If you think that you or someone else may have taken too much of an opioid, get medical help right away. This information is not intended to replace advice given to you by your health care provider. Make sure you discuss any questions you have with your health care provider. Document Revised: 04/21/2021 Document Reviewed: 01/06/2021 Elsevier Patient Education  2023 Elsevier Inc.   Advanced directives: No  Conditions/risks identified: None  Next appointment: Follow up in one year for your annual wellness visit     Preventive Care 65 Years and Older, Female Preventive care refers to lifestyle choices and visits with your health care provider that can promote health and wellness. What does preventive care include? A yearly physical exam. This is also called an annual well check. Dental exams once or twice a year. Routine eye exams. Ask your  health care provider how often you should have your eyes checked. Personal lifestyle choices, including: Daily care of your teeth and gums. Regular physical activity. Eating a healthy diet. Avoiding tobacco and drug use. Limiting alcohol use. Practicing safe sex. Taking low-dose aspirin every day. Taking vitamin and mineral supplements as recommended by your health care provider. What happens during an annual well check? The services and screenings done by your health care provider during your annual well check will depend on your age, overall health, lifestyle risk factors, and family history of disease. Counseling  Your health care provider may ask you questions about your: Alcohol use. Tobacco use. Drug use. Emotional well-being. Home and relationship well-being. Sexual activity. Eating habits. History of falls.  Memory and ability to understand (cognition). Work and work Astronomer. Reproductive health. Screening  You may have the following tests or measurements: Height, weight, and BMI. Blood pressure. Lipid and cholesterol levels. These may be checked every 5 years, or more frequently if you are over 84 years old. Skin check. Lung cancer screening. You may have this screening every year starting at age 23 if you have a 30-pack-year history of smoking and currently smoke or have quit within the past 15 years. Fecal occult blood test (FOBT) of the stool. You may have this test every year starting at age 16. Flexible sigmoidoscopy or colonoscopy. You may have a sigmoidoscopy every 5 years or a colonoscopy every 10 years starting at age 22. Hepatitis C blood test. Hepatitis B blood test. Sexually transmitted disease (STD) testing. Diabetes screening. This is done by checking your blood sugar (glucose) after you have not eaten for a while (fasting). You may have this done every 1-3 years. Bone density scan. This is done to screen for osteoporosis. You may have this done  starting at age 92. Mammogram. This may be done every 1-2 years. Talk to your health care provider about how often you should have regular mammograms. Talk with your health care provider about your test results, treatment options, and if necessary, the need for more tests. Vaccines  Your health care provider may recommend certain vaccines, such as: Influenza vaccine. This is recommended every year. Tetanus, diphtheria, and acellular pertussis (Tdap, Td) vaccine. You may need a Td booster every 10 years. Zoster vaccine. You may need this after age 43. Pneumococcal 13-valent conjugate (PCV13) vaccine. One dose is recommended after age 32. Pneumococcal polysaccharide (PPSV23) vaccine. One dose is recommended after age 84. Talk to your health care provider about which screenings and vaccines you need and how often you need them. This information is not intended to replace advice given to you by your health care provider. Make sure you discuss any questions you have with your health care provider. Document Released: 10/23/2015 Document Revised: 06/15/2016 Document Reviewed: 07/28/2015 Elsevier Interactive Patient Education  2017 [employer].  Fall Prevention in the Home Falls can cause injuries. They can happen to people of all ages. There are many things you can do to make your home safe and to help prevent falls. What can I do on the outside of my home? Regularly fix the edges of walkways and driveways and fix any cracks. Remove anything that might make you trip as you walk through a door, such as a raised step or threshold. Trim any bushes or trees on the path to your home. Use bright outdoor lighting. Clear any walking paths of anything that might make someone trip, such as rocks or tools. Regularly check to see if handrails are loose or broken. Make sure that both sides of any steps have handrails. Any raised decks and porches should have guardrails on the edges. Have any leaves, snow, or  ice cleared regularly. Use sand or salt on walking paths during winter. Clean up any spills in your garage right away. This includes oil or grease spills. What can I do in the bathroom? Use night lights. Install grab bars by the toilet and in the tub and shower. Do not use towel bars as grab bars. Use non-skid mats or decals in the tub or shower. If you need to sit down in the shower, use a plastic, non-slip stool. Keep the floor dry. Clean up any water that spills on the floor  as soon as it happens. Remove soap buildup in the tub or shower regularly. Attach bath mats securely with double-sided non-slip rug tape. Do not have throw rugs and other things on the floor that can make you trip. What can I do in the bedroom? Use night lights. Make sure that you have a light by your bed that is easy to reach. Do not use any sheets or blankets that are too big for your bed. They should not hang down onto the floor. Have a firm chair that has side arms. You can use this for support while you get dressed. Do not have throw rugs and other things on the floor that can make you trip. What can I do in the kitchen? Clean up any spills right away. Avoid walking on wet floors. Keep items that you use a lot in easy-to-reach places. If you need to reach something above you, use a strong step stool that has a grab bar. Keep electrical cords out of the way. Do not use floor polish or wax that makes floors slippery. If you must use wax, use non-skid floor wax. Do not have throw rugs and other things on the floor that can make you trip. What can I do with my stairs? Do not leave any items on the stairs. Make sure that there are handrails on both sides of the stairs and use them. Fix handrails that are broken or loose. Make sure that handrails are as long as the stairways. Check any carpeting to make sure that it is firmly attached to the stairs. Fix any carpet that is loose or worn. Avoid having throw rugs at  the top or bottom of the stairs. If you do have throw rugs, attach them to the floor with carpet tape. Make sure that you have a light switch at the top of the stairs and the bottom of the stairs. If you do not have them, ask someone to add them for you. What else can I do to help prevent falls? Wear shoes that: Do not have high heels. Have rubber bottoms. Are comfortable and fit you well. Are closed at the toe. Do not wear sandals. If you use a stepladder: Make sure that it is fully opened. Do not climb a closed stepladder. Make sure that both sides of the stepladder are locked into place. Ask someone to hold it for you, if possible. Clearly mark and make sure that you can see: Any grab bars or handrails. First and last steps. Where the edge of each step is. Use tools that help you move around (mobility aids) if they are needed. These include: Canes. Walkers. Scooters. Crutches. Turn on the lights when you go into a dark area. Replace any light bulbs as soon as they burn out. Set up your furniture so you have a clear path. Avoid moving your furniture around. If any of your floors are uneven, fix them. If there are any pets around you, be aware of where they are. Review your medicines with your doctor. Some medicines can make you feel dizzy. This can increase your chance of falling. Ask your doctor what other things that you can do to help prevent falls. This information is not intended to replace advice given to you by your health care provider. Make sure you discuss any questions you have with your health care provider. Document Released: 07/23/2009 Document Revised: 03/03/2016 Document Reviewed: 10/31/2014 Elsevier Interactive Patient Education  2017 [employer].

## 2022-05-04 ENCOUNTER — Other Ambulatory Visit: Payer: Self-pay | Admitting: Family Medicine

## 2022-05-05 ENCOUNTER — Telehealth: Payer: Self-pay | Admitting: Family Medicine

## 2022-05-05 NOTE — Telephone Encounter (Signed)
Pt is calling and still in pain and uc prescribed this medication methocarbamol (ROBAXIN) 500 MG tablet  for muscle spasm and pt would like a refill on med  [id number] - [place], [place] - [address] AT [facility] [address] Phone:  [phone]  Fax:  [phone]

## 2022-05-06 ENCOUNTER — Telehealth: Payer: Self-pay

## 2022-05-06 MED ORDER — METHOCARBAMOL 500 MG PO TABS: 500.0000 mg | ORAL_TABLET | Freq: Three times a day (TID) | ORAL | 2 refills | Status: DC | PRN

## 2022-05-06 NOTE — Telephone Encounter (Signed)
The letter about jury duty is ready to pick up

## 2022-05-06 NOTE — Telephone Encounter (Signed)
Seen in Urgent Care on 04/24/22.    Pharmacy updated.

## 2022-05-06 NOTE — Telephone Encounter (Signed)
I sent in the refill.

## 2022-05-06 NOTE — Telephone Encounter (Signed)
Pt message was sent to PCP for advise 

## 2022-05-06 NOTE — Telephone Encounter (Signed)
Rx already sent by Dr Fry 

## 2022-05-09 NOTE — Telephone Encounter (Signed)
Pt was notified.  

## 2022-05-10 ENCOUNTER — Telehealth: Payer: Self-pay

## 2022-05-10 NOTE — Telephone Encounter (Signed)
Attempted to contact pt regarding her Jury Duty letter, no option to leave pt a message due to mailbox being full

## 2022-05-10 NOTE — Telephone Encounter (Signed)
Pt is aware jury duty letter is ready for pick

## 2022-05-13 ENCOUNTER — Telehealth: Payer: Self-pay | Admitting: Family Medicine

## 2022-05-13 DIAGNOSIS — M25551 Pain in right hip: Secondary | ICD-10-CM

## 2022-05-13 NOTE — Telephone Encounter (Signed)
Pt call and stated she need Harriett Sine to call her back .

## 2022-05-13 NOTE — Telephone Encounter (Signed)
Spoke with pt states that PT is not helping her hip pain and she wants a referral to Dr Dion Saucier Pharmacist, community) on [address]. Please advise if ok

## 2022-05-16 NOTE — Telephone Encounter (Signed)
I did the referral 

## 2022-05-18 ENCOUNTER — Other Ambulatory Visit: Payer: Self-pay | Admitting: Internal Medicine

## 2022-06-09 LAB — HM DIABETES EYE EXAM

## 2022-06-10 ENCOUNTER — Telehealth: Payer: Self-pay | Admitting: Family Medicine

## 2022-06-10 NOTE — Telephone Encounter (Signed)
Pt would like to know if it would be ok for her to get an RSV shot?  Pt states she thinks she has had 4 or 5 Covid shots already?  Please advise.

## 2022-06-14 NOTE — Telephone Encounter (Signed)
Yes I think she should get the RSV shot anytime now

## 2022-06-16 NOTE — Telephone Encounter (Signed)
Lvm for patient okay for RSV  injection per Dr. Fry.  

## 2022-06-19 ENCOUNTER — Other Ambulatory Visit: Payer: Self-pay | Admitting: Family Medicine

## 2022-06-22 ENCOUNTER — Ambulatory Visit: Payer: Medicare Other

## 2022-06-23 ENCOUNTER — Ambulatory Visit (INDEPENDENT_AMBULATORY_CARE_PROVIDER_SITE_OTHER): Payer: Medicare Other

## 2022-06-23 DIAGNOSIS — Z23 Encounter for immunization: Secondary | ICD-10-CM | POA: Diagnosis not present

## 2022-07-06 ENCOUNTER — Telehealth: Payer: Self-pay | Admitting: Family Medicine

## 2022-07-06 NOTE — Telephone Encounter (Signed)
Pt is calling and would like to know if dr fry recommends her to get rsv and next covid booster

## 2022-07-06 NOTE — Telephone Encounter (Signed)
Yes she should get both vaccines

## 2022-07-07 NOTE — Telephone Encounter (Signed)
Lvm concerning vaccine information.

## 2022-07-08 ENCOUNTER — Other Ambulatory Visit: Payer: Self-pay

## 2022-07-08 ENCOUNTER — Telehealth: Payer: Self-pay | Admitting: Family Medicine

## 2022-07-08 DIAGNOSIS — J40 Bronchitis, not specified as acute or chronic: Secondary | ICD-10-CM

## 2022-07-08 MED ORDER — ALBUTEROL SULFATE HFA 108 (90 BASE) MCG/ACT IN AERS: 1.0000 | INHALATION_SPRAY | RESPIRATORY_TRACT | 11 refills | Status: DC | PRN

## 2022-07-08 NOTE — Telephone Encounter (Signed)
Last refill from inpatient MD-10/05/21 Okay for refill?

## 2022-07-08 NOTE — Telephone Encounter (Signed)
Please refill this, #1 with 11 refills

## 2022-07-08 NOTE — Telephone Encounter (Signed)
[facility] [address]

## 2022-07-08 NOTE — Telephone Encounter (Signed)
Pt requesting refill, received this from a doctor at the hospital albuterol (VENTOLIN HFA) 108 (90 Base) MCG/ACT inhaler.  [id number] [address] [place] Phone:  [phone]  Fax:  [phone]

## 2022-07-15 ENCOUNTER — Other Ambulatory Visit: Payer: Self-pay | Admitting: Family Medicine

## 2022-07-23 ENCOUNTER — Other Ambulatory Visit: Payer: Self-pay | Admitting: Family Medicine

## 2022-07-30 ENCOUNTER — Other Ambulatory Visit: Payer: Self-pay | Admitting: Family Medicine

## 2022-08-08 ENCOUNTER — Other Ambulatory Visit: Payer: Self-pay | Admitting: Family Medicine

## 2022-08-08 MED ORDER — FERROUS GLUCONATE 324 (38 FE) MG PO TABS: 324.0000 mg | ORAL_TABLET | Freq: Every day | ORAL | 0 refills | Status: DC

## 2022-08-08 NOTE — Telephone Encounter (Signed)
Pt requesting refill ferrous gluconate (FERGON) 324 MG tablet

## 2022-08-18 ENCOUNTER — Other Ambulatory Visit: Payer: Self-pay | Admitting: Cardiology

## 2022-08-25 ENCOUNTER — Encounter: Payer: Self-pay | Admitting: Internal Medicine

## 2022-08-25 ENCOUNTER — Ambulatory Visit: Payer: Medicare Other | Admitting: Internal Medicine

## 2022-08-25 VITALS — BP 128/82 | HR 61 | Ht 63.0 in | Wt 248.8 lb

## 2022-08-25 DIAGNOSIS — E782 Mixed hyperlipidemia: Secondary | ICD-10-CM

## 2022-08-25 DIAGNOSIS — E1165 Type 2 diabetes mellitus with hyperglycemia: Secondary | ICD-10-CM

## 2022-08-25 DIAGNOSIS — E669 Obesity, unspecified: Secondary | ICD-10-CM | POA: Diagnosis not present

## 2022-08-25 LAB — POCT GLYCOSYLATED HEMOGLOBIN (HGB A1C): Hemoglobin A1C: 6.7 % — AB (ref 4.0–5.6)

## 2022-08-25 NOTE — Patient Instructions (Addendum)
Please continue: - Metformin ER 1000 mg 2x a day with meals  - Januvia 100 mg before breakfast  Move: - Amaryl 0.5-1 mg 15 min before dinner and Glucerna  Please return in 4 months with your sugar log.

## 2022-08-25 NOTE — Progress Notes (Signed)
Patient ID: Krista Benjamin, female   DOB: 1951-02-04, 71 y.o.   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  Last visit 5 months ago. She has [place].   Interim history: She was previously on a "21-day fast": No red meat, sugar, starches.  She did this again in 10/2021, but not recently. No increased urination, blurry vision, nausea, chest pain.   She has sciatica >> had 2 steroid inj and then PT. On Diclofenac gel and she is doing the exercises, feeling better. Works from 7-7:45 and from 2-2:45 pm.  Reviewed HbA1c levels: Lab Results  Component Value Date   HGBA1C 7.2 (H) 04/01/2022   HGBA1C 6.2 (A) 12/01/2021   HGBA1C 7.1 (A) 07/19/2021  Prev. 5.9% on 12/2012, previously 6.1%.  Pt is on a regimen of: - Metformin ER 1000 mg 2x a day with meals  - Tradjenta 5 >> Januvia 100 mg before breakfast - per insurance pref. - Amaryl 2 mg before dinner >> but was taking it at bedtime >> 1 mg before dinner (does not take this before Glucerna) >> 0.5-1 mg before solid meals and Glucerna >> at bedtime!! Other medications tried: Rec'd GLP1 agonist >> Victoza covered >> she was afraid of SEs. We tried Jardiance 10 mg daily in am - added 04/2017 >> yeast inf >> stopped We tried Invokana 100 mg in am >> stopped after 3 days >> nausea, diarrhea We stopped Lantus 10 units qhs. We stopped Actos 45 mg (was on it since 07/2001) Metformin IR 1000 mg bid >> stopped b/c Nausea/Diarrhea She was on Novolog 8 units tid ac, when sugars >140  We stopped Amaryl 4 mg before breakfast in 11/2020 but had to restart 2/2 high CBGs.Marland Kitchen  Pt checks her sugars 1-2 times a day: - am:  104-143, 155, 208 >> 67-116 >> 63, 80-123, 123 >> 86-135 - 2h after b'fast: 189 >> 115 >> n/c >> 130 after coffee >> n/c - lunch:  183 >> 71, 82 >> n/c >> 156 >> 79 >> n/c >> 67,  87-109 - 2h after lunch: 303 >> n/c >> 201 >> 104 >> n/c - dinner: 171, 178, 195 (steroids) >> n/c >> 62 >> n/c - 2h after dinner: 197, 210, 231  >> 115-173 >> n/c - bedtime: 81 >>132, 364 (grapes) >> 134 >> 107, 211 >> n/c - night:  n/c >> 184 >> 125, 128 >> n/c  >> 75 >> n/c >> 170 Lowest sugar:  53 >> 104 >> 67; she has hypoglycemia awareness in the 70s. Highest sugar: 300 (at the time of her fx) >> .Marland KitchenMarland Kitchen191 >> 364 >> 211 >> 170  Glucometer: One Touch Verio Flex  She lost 80 pounds since 2012 >> She was able to come off insulin then. She saw Antonieta Iba (nutritionist) on 01/01/2015.  -+ Mild CKD. Latest BUN/creatinine was:  Lab Results  Component Value Date   BUN 31 (H) 04/01/2022   Lab Results  Component Value Date   CREATININE 1.17 04/01/2022  On Entresto.  Lab Results  Component Value Date   MICRALBCREAT 3.4 11/20/2020   MICRALBCREAT 2.3 08/26/2015   MICRALBCREAT 5.5 09/22/2014   MICRALBCREAT 5.4 11/21/2007   -+ HL.  Latest lipid profile: Lab Results  Component Value Date   CHOL 159 04/01/2022   HDL 52.50 04/01/2022   LDLCALC 80 04/01/2022   TRIG 131.0 04/01/2022  CHOLHDL 3 04/01/2022  On Lipitor 40.  - last eye exam: 06/09/2022: No DR; previous history of background DR; + h/o B cataract sx.  - no numbness and tingling in her legs.  Last foot exam 12/01/2021.  She also has a history of HTN, iron deficiency anemia-on iron therapy, aortic valve disease/murmur, left ventricular hypertrophy per EKG, obesity, multinodular goiter, fibroids.  She had shingles in 11/2014 and 02/2016.  She fell and fractured R wrist 06/2018. She had another, right radial, fracture 01/2019. She was in the ED on 10/05/2021 with hypertensive urgency (systolic BP in the 326Z).  ROS: + See HPI  I reviewed pt's medications, allergies, PMH, social hx, family hx, and changes were documented in the history of present illness. Otherwise, unchanged from my initial visit note.  Past Medical  History:  Diagnosis Date   Anemia    Aortic stenosis    Mild   Arthritis    Cataract    both eyes  (cataracts removed)   Diabetes mellitus    sees Dr. Cruzita Lederer    Gynecological examination    sees Dr. Nori Riis    Hyperlipidemia    Hypertension    sees Dr. Marijo File   Stroke [facility])    TIA   Past Surgical History:  Procedure Laterality Date   CARPAL TUNNEL RELEASE  01/22/14   Left Hand    CATARACT EXTRACTION     Lens surgery   COLONOSCOPY  02/06/2018   per Dr. Loletha Carrow, no polyps, repeat in 5 yrs (colon CA in mother)    excision of benign cyst     from left maxilla and left maxillary sinus   KNEE SURGERY     RIGHT/LEFT HEART CATH AND CORONARY ANGIOGRAPHY N/A 09/14/2018   Procedure: RIGHT/LEFT HEART CATH AND CORONARY ANGIOGRAPHY;  Surgeon: Martinique, Peter M, MD;  Location: [facility];  Service: Cardiovascular;  Laterality: N/A;   SHOULDER ARTHROSCOPY W/ ROTATOR CUFF REPAIR Left 09-23-13   per Dr. Devonne Doughty at [facility] center in [place] History   Socioeconomic History   Marital status: Married    Spouse name: Not on file   Number of children: Not on file   Years of education: Not on file   Highest education level: Not on file  Occupational History   Not on file  Tobacco Use   Smoking status: Never   Smokeless tobacco: Never  Vaping Use   Vaping Use: Never used  Substance and Sexual Activity   Alcohol use: No    Alcohol/week: 0.0 standard drinks of alcohol   Drug use: No   Sexual activity: Not on file  Other Topics Concern   Not on file  Social History Narrative   Not on file   Social Determinants of Health   Financial Resource Strain: Low Risk  (05/02/2022)   Overall Financial Resource Strain (CARDIA)    Difficulty of Paying Living Expenses: Not hard at all  Food Insecurity: No Food Insecurity (05/02/2022)   Hunger Vital Sign    Worried About Running Out of Food in the Last Year: Never true    [place] in the Last Year: Never  true  Transportation Needs: No Transportation Needs (05/02/2022)   PRAPARE - Hydrologist (Medical): No    Lack of Transportation (Non-Medical): No  Physical Activity: Inactive (05/02/2022)   Exercise Vital Sign    Days of Exercise per Week: 0 days    Minutes  of Exercise per Session: 0 min  Stress: No Stress Concern Present (05/02/2022)   [place]    Feeling of Stress : Not at all  Social Connections: [place] (05/02/2022)   Social Connection and Isolation Panel [NHANES]    Frequency of Communication with Friends and Family: More than three times a week    Frequency of Social Gatherings with Friends and Family: More than three times a week    Attends Religious Services: More than 4 times per year    Active Member of [employer] or Organizations: Yes    Attends Music therapist: More than 4 times per year    Marital Status: Married  Human resources officer Violence: Not At Risk (05/02/2022)   Humiliation, Afraid, Rape, and Kick questionnaire    Fear of Current or Ex-Partner: No    Emotionally Abused: No    Physically Abused: No    Sexually Abused: No   Current Outpatient Medications on File Prior to Visit  Medication Sig Dispense Refill   albuterol (PROVENTIL) (2.5 MG/3ML) 0.083% nebulizer solution Take 3 mLs (2.5 mg total) by nebulization every 6 (six) hours as needed for wheezing or shortness of breath. 75 mL 12   albuterol (VENTOLIN HFA) 108 (90 Base) MCG/ACT inhaler Inhale 1-2 puffs into the lungs every 4 (four) hours as needed for wheezing or shortness of breath. 1 each 11   Alcohol Swabs (ALCOHOL PREP) 70 % PADS USE TO CHECK BLOOD SUGAR ONCE DAILY 200 each 3   aspirin EC 81 MG tablet Take 81 mg by mouth at bedtime. Swallow whole.     atorvastatin (LIPITOR) 40 MG tablet TAKE 1 TABLET AT BEDTIME 90 tablet 1   Blood Glucose Monitoring Suppl (TRUE METRIX GO GLUCOSE METER) w/Device  KIT Use as instructed to check blood sugar 2 times daily 1 kit 0   bumetanide (BUMEX) 1 MG tablet TAKE 2 TABLETS AT BEDTIME 180 tablet 0   cholecalciferol (VITAMIN D) 1000 UNITS tablet Take 2,000 Units by mouth at bedtime.     diclofenac (VOLTAREN) 75 MG EC tablet Take 1 tablet (75 mg total) by mouth 2 (two) times daily as needed for moderate pain. 60 tablet 5   diclofenac Sodium (VOLTAREN) 1 % GEL Apply 1 application topically 4 (four) times daily. 100 g 0   EPIPEN 2-PAK 0.3 MG/0.3ML SOAJ injection INJECT 0.3 MLS (0.3 MG TOTAL) INTO THE MUSCLE ONCE. 1 Device 0   ferrous gluconate (FERGON) 324 MG tablet Take 1 tablet (324 mg total) by mouth daily. 90 tablet 0   Fexofenadine HCl (ALLEGRA PO) Take 1 tablet by mouth daily at 12 noon.     glimepiride (AMARYL) 1 MG tablet Take 2 tablets (2 mg total) by mouth daily before breakfast. (Patient taking differently: Take 1 mg by mouth daily before breakfast. Takes 1/2 (half) a tablet daily) 90 tablet 3   glucose blood (TRUE METRIX BLOOD GLUCOSE TEST) test strip USE AS INSTRUCTED TO CHECK BLOOD SUGAR TWICE DAILY E11.65 200 strip 3   hydrALAZINE (APRESOLINE) 100 MG tablet Take 1 tablet (100 mg total) by mouth 3 (three) times daily. 270 tablet 3   HYDROcodone bit-homatropine (HYCODAN) 5-1.5 MG/5ML syrup Take 5 mLs by mouth every 4 (four) hours as needed for cough. 240 mL 0   HYDROcodone-acetaminophen (NORCO/VICODIN) 5-325 MG tablet Take 1-2 tablets by mouth every 6 (six) hours as needed for moderate pain or severe pain. 12 tablet 0   isosorbide mononitrate (IMDUR)  120 MG 24 hr tablet TAKE 1 TABLET DAILY 90 tablet 3   ketoconazole (NIZORAL) 2 % cream APPLY TO AFFECTED AREA DAILY 30 g 5   metFORMIN (GLUCOPHAGE-XR) 500 MG 24 hr tablet TAKE 2 TABLETS TWICE A DAY WITH MEALS 360 tablet 3   methocarbamol (ROBAXIN) 500 MG tablet Take 1 tablet (500 mg total) by mouth every 8 (eight) hours as needed for muscle spasms. 90 tablet 2   methylPREDNISolone (MEDROL DOSEPAK) 4 MG  TBPK tablet As directed 21 tablet 0   metoprolol succinate (TOPROL-XL) 100 MG 24 hr tablet TAKE 1 TABLET BY MOUTH DAILY. TAKE WITH OR IMMEDIATELY FOLLOWING A MEAL. 90 tablet 1   mupirocin ointment (BACTROBAN) 2 % Place 1 application into the nose 2 (two) times daily. 22 g 1   polyvinyl alcohol (LIQUIFILM TEARS) 1.4 % ophthalmic solution Place 1 drop into both eyes daily as needed for dry eyes.     sacubitril-valsartan (ENTRESTO) 97-103 MG Take 1 tablet by mouth 2 (two) times daily. 90 tablet 0   sitaGLIPtin (JANUVIA) 100 MG tablet Take 1 tablet (100 mg total) by mouth daily. 90 tablet 3   TRUEplus Lancets 33G MISC USE AS INSTRUCTED TO CHECK BLOOD SUGAR 2 TIMES DAILY 200 each 3   No current facility-administered medications on file prior to visit.   Allergies  Allergen Reactions   Bee Venom Anaphylaxis   Family History  Problem Relation Age of Onset   Coronary artery disease Brother    Coronary artery disease Mother    Colon cancer Mother    Cancer Other        fhx   Diabetes Other        fhx   Hyperlipidemia Other        fhx   Hypertension Other        fhx   Stroke Other        fhx   Colon polyps Neg Hx    Esophageal cancer Neg Hx    Rectal cancer Neg Hx    Stomach cancer Neg Hx    PE: BP 128/82 (BP Location: Left Arm, Patient Position: Sitting, Cuff Size: Normal)   Pulse 61   Ht _0  (1.6 m)   Wt 248 lb 12.8 oz (112.9 kg)   SpO2 99%   BMI 44.07 kg/m    Wt Readings from Last 3 Encounters:  08/25/22 248 lb 12.8 oz (112.9 kg)  05/02/22 239 lb (108.4 kg)  04/25/22 246 lb (111.6 kg)   Constitutional: overweight, in NAD Eyes:EOMI, no exophthalmos ENT: no thyromegaly, no cervical lymphadenopathy Cardiovascular: RRR, No MRG, + B LE edema. Respiratory: CTA B Musculoskeletal: no deformities Skin: no rashes Neurological: no tremor with outstretched hands  ASSESSMENT: 1. DM2, non-insulin-dependent, uncontrolled, with complications - CKD stage 2 - background DR  2.   Obesity class III  3. HL  PLAN:  1.  Patient with longstanding, uncontrolled, type 2 diabetes, on metformin and DPP 4 inhibitor, to which we added sulfonylurea in 2022, when sugars increased to 300s.,  She was eating [place] grapes.  Sugars improved after stopping these, however, at last visit, HbA1c was higher, at 7.2%.  Sugars in the morning appears to be well controlled but she was not checking his until later in the night to understand trends.  She was taking a lower dose of Amaryl to recommended, and only taken it before solid dinners, not before Glucerna.  I advised her to increase the dose to 1 mg and take  it before both solid dinners and Glucerna but I did advise her to cut the dose in half for smaller meals. -At today's visit, sugars appear to be mostly at goal, with a slightly low, at 67, before lunch, within the last month.  In the morning, sugars are at goal.  However, upon questioning, she is taking Amaryl at bedtime, rather than before dinner.  We discussed about moving this 15 minutes before dinner.  Otherwise, we can continue the same regimen. - I advised her to:  Patient Instructions  Please continue: - Metformin ER 1000 mg 2x a day with meals  - Januvia 100 mg before breakfast  Move: - Amaryl 0.5-1 mg 15 min before dinner and Glucerna  Please return in 4 months with your sugar log.   - advised to check sugars at different times of the day - 1x a day, rotating check times - advised for yearly eye exams >> she is not UTD but plans to schedule this - return to clinic in 4 months  2.  Obesity class III -She continues on metformin and Januvia which are both weight neutral.  She is also on low-dose Amaryl which can be weight inducing. -She gained 3 pounds before last visit -Since last visit, she gained 9 pounds  3. HL -Reviewed latest lipid panel from 03/2022: LDL above our target of less than 70, otherwise, fractions at goal: Lab Results  Component Value Date   CHOL 159  04/01/2022   HDL 52.50 04/01/2022   LDLCALC 80 04/01/2022   TRIG 131.0 04/01/2022   CHOLHDL 3 04/01/2022  -continues on Lipitor 40 mg daily without side effects  Philemon Kingdom, MD PhD [facility] Endocrinology

## 2022-09-06 ENCOUNTER — Encounter: Payer: Self-pay | Admitting: *Deleted

## 2022-09-19 ENCOUNTER — Telehealth: Payer: Self-pay | Admitting: Cardiology

## 2022-09-19 NOTE — Telephone Encounter (Signed)
I did not need this encounter. °

## 2022-09-29 MED ORDER — AZITHROMYCIN 250 MG PO TABS: ORAL_TABLET | ORAL | 0 refills | Status: DC

## 2022-09-29 NOTE — Telephone Encounter (Signed)
Her husband is sick so this is to protect her

## 2022-10-03 ENCOUNTER — Other Ambulatory Visit: Payer: Self-pay | Admitting: Internal Medicine

## 2022-10-03 DIAGNOSIS — E1165 Type 2 diabetes mellitus with hyperglycemia: Secondary | ICD-10-CM

## 2022-10-05 MED ORDER — GLIMEPIRIDE 1 MG PO TABS: 0.5000 mg | ORAL_TABLET | Freq: Every day | ORAL | 3 refills | Status: DC

## 2022-10-07 ENCOUNTER — Other Ambulatory Visit: Payer: Self-pay | Admitting: Internal Medicine

## 2022-10-21 ENCOUNTER — Other Ambulatory Visit: Payer: Self-pay

## 2022-10-21 MED ORDER — ENTRESTO 97-103 MG PO TABS: 1.0000 | ORAL_TABLET | Freq: Two times a day (BID) | ORAL | 0 refills | Status: DC

## 2022-10-24 ENCOUNTER — Other Ambulatory Visit: Payer: Self-pay | Admitting: Family Medicine

## 2022-10-24 ENCOUNTER — Other Ambulatory Visit: Payer: Self-pay

## 2022-10-24 MED ORDER — ATORVASTATIN CALCIUM 40 MG PO TABS: 40.0000 mg | ORAL_TABLET | Freq: Every day | ORAL | 1 refills | Status: DC

## 2022-10-25 ENCOUNTER — Telehealth: Payer: Self-pay | Admitting: Cardiology

## 2022-10-25 NOTE — Telephone Encounter (Signed)
Patient stated [employer] needs information from our office for her Krista Benjamin. Informed patient the application was faxed to [employer] on 09/19/22. [employer] who stated they never received the fax. They need prescription with signature and health care provided application. Patient advised of this and that it will be taken care of when Dr. Percival Spanish returns to office.

## 2022-10-25 NOTE — Telephone Encounter (Signed)
Patient called about patient assistant paperwork for her to be able to get her medication sacubitril-valsartan (ENTRESTO) 97-103 MG . She needs to get that paperwork in ASAP. Please advise

## 2022-10-28 ENCOUNTER — Other Ambulatory Visit: Payer: Self-pay | Admitting: *Deleted

## 2022-10-28 MED ORDER — ENTRESTO 97-103 MG PO TABS: 1.0000 | ORAL_TABLET | Freq: Two times a day (BID) | ORAL | 3 refills | Status: DC

## 2022-10-28 NOTE — Telephone Encounter (Signed)
Prescription printed and placed for dr hochrein to sign.

## 2022-11-04 ENCOUNTER — Other Ambulatory Visit: Payer: Self-pay | Admitting: Family Medicine

## 2022-11-08 NOTE — Telephone Encounter (Signed)
Prescription faxed to [employer].

## 2022-11-14 ENCOUNTER — Encounter: Payer: Self-pay | Admitting: *Deleted

## 2022-11-24 ENCOUNTER — Telehealth: Payer: Self-pay | Admitting: Cardiology

## 2022-11-24 NOTE — Telephone Encounter (Signed)
Pt c/o medication issue:  1. Name of Medication: sacubitril-valsartan (ENTRESTO) 97-103 MG   2. How are you currently taking this medication (dosage and times per day)? N/A  3. Are you having a reaction (difficulty breathing--STAT)? N/A  4. What is your medication issue? Needing confirmation on how many tablets, & if medication is for a 30 or 90 day supply.

## 2022-11-24 NOTE — Telephone Encounter (Signed)
Verified with [facility] that patient is tatking entresto 97-103, and that patient can have 90-day supply with 3 refills.

## 2022-12-04 ENCOUNTER — Other Ambulatory Visit: Payer: Self-pay | Admitting: Internal Medicine

## 2022-12-04 DIAGNOSIS — E1165 Type 2 diabetes mellitus with hyperglycemia: Secondary | ICD-10-CM

## 2022-12-05 ENCOUNTER — Telehealth: Payer: Self-pay | Admitting: Family Medicine

## 2022-12-05 MED ORDER — SITAGLIPTIN PHOSPHATE 100 MG PO TABS: 100.0000 mg | ORAL_TABLET | Freq: Every day | ORAL | 0 refills | Status: DC

## 2022-12-05 NOTE — Telephone Encounter (Signed)
Prescription Request  12/05/2022  Is this a "Controlled Substance" medicine? No  LOV: 04/25/2022  What is the name of the medication or equipment? atorvastatin (LIPITOR) 40 MG tablet   Have you contacted your pharmacy to request a refill? Yes  atorvastatin (LIPITOR) 40 MG tablet  Which pharmacy would you like this sent to?  [facility], [place] [address] Phone: [phone] Fax: [phone]     Patient notified that their request is being sent to the clinical staff for review and that they should receive a response within 2 business days.   Please advise at Mobile [phone] (mobile)

## 2022-12-05 NOTE — Telephone Encounter (Signed)
Patient is calling saying that because she owed money to [facility] they would not ship her medication to her.  Patietn states that she paid her bill but it will still take 10 days to get medication  sitaGLIPtin sitaGLIPtin (JANUVIA) 100 MG tablet. Patient states that she is completely out and wants to know if an emergency prescription can be sent in to   [id number] [place] [address](Ph: [phone] Until her medication comes in from [place]

## 2022-12-07 ENCOUNTER — Other Ambulatory Visit: Payer: Self-pay

## 2022-12-07 MED ORDER — ATORVASTATIN CALCIUM 40 MG PO TABS: 40.0000 mg | ORAL_TABLET | Freq: Every day | ORAL | 1 refills | Status: DC

## 2022-12-07 NOTE — Telephone Encounter (Addendum)
Refill sent to [facility].  

## 2022-12-20 ENCOUNTER — Telehealth: Payer: Self-pay | Admitting: Family Medicine

## 2022-12-20 NOTE — Telephone Encounter (Signed)
Pt is calling and would like to know if its ok for her to take latest covid vaccine

## 2022-12-20 NOTE — Telephone Encounter (Signed)
Called patient vm was full.

## 2022-12-20 NOTE — Telephone Encounter (Signed)
Yes that is a good idea

## 2022-12-21 ENCOUNTER — Encounter: Payer: Self-pay | Admitting: Gastroenterology

## 2022-12-22 NOTE — Telephone Encounter (Signed)
Spoke with pt advised of Dr Sarajane Jews recommendation, verbalized understanding. Pt states that  her Husband also needs the vaccine but he has a liver biopsy scheduled for 01/06/23. Pt wants to know if he can get the Vaccine before or after the biopsy procedure. Please advise

## 2022-12-23 ENCOUNTER — Telehealth: Payer: Self-pay | Admitting: Cardiology

## 2022-12-23 DIAGNOSIS — I10 Essential (primary) hypertension: Secondary | ICD-10-CM

## 2022-12-23 MED ORDER — SACUBITRIL-VALSARTAN 97-103 MG PO TABS: 1.0000 | ORAL_TABLET | Freq: Two times a day (BID) | ORAL | Status: AC

## 2022-12-23 NOTE — Telephone Encounter (Signed)
Pt c/o medication issue:  1. Name of Medication:   sacubitril-valsartan (ENTRESTO) 97-103 MG    2. How are you currently taking this medication (dosage and times per day)? Take 1 tablet by mouth 2 (two) times daily.   3. Are you having a reaction (difficulty breathing--STAT)? No  4. What is your medication issue? [facility] but instead of getting [facility], someone selected 2 day shipping. Pt stated it will not come until Tuesday, but the pt only has enough to last till tomorrow. Pt would like to get an emergency supply that will last her till Tuesday. Pt stated she will be leaving to go out of town today at [facility]. Please advise.

## 2022-12-23 NOTE — Telephone Encounter (Signed)
Tried to call pt no way to leave a message due to voicemail being full, will try later

## 2022-12-23 NOTE — Telephone Encounter (Signed)
Patient needs supply to last through Tuesday.  Sent a 7 day supply to ensure enough to last until mail order received.  Patient aware and [facility] [address].  Sent as requested

## 2022-12-23 NOTE — Telephone Encounter (Signed)
Her husband should wait until after the biopsy to take the vaccine

## 2022-12-26 NOTE — Telephone Encounter (Signed)
Spoke with pt verbalized that her husband needs to wait until his biopsy to get his COVID vaccine.

## 2022-12-27 ENCOUNTER — Ambulatory Visit: Payer: Medicare Other | Admitting: Internal Medicine

## 2022-12-27 ENCOUNTER — Encounter: Payer: Self-pay | Admitting: Internal Medicine

## 2022-12-27 VITALS — BP 132/90 | HR 80 | Ht 63.0 in | Wt 245.2 lb

## 2022-12-27 DIAGNOSIS — E1122 Type 2 diabetes mellitus with diabetic chronic kidney disease: Secondary | ICD-10-CM | POA: Diagnosis not present

## 2022-12-27 DIAGNOSIS — E0822 Diabetes mellitus due to underlying condition with diabetic chronic kidney disease: Secondary | ICD-10-CM | POA: Insufficient documentation

## 2022-12-27 DIAGNOSIS — E782 Mixed hyperlipidemia: Secondary | ICD-10-CM

## 2022-12-27 DIAGNOSIS — E1165 Type 2 diabetes mellitus with hyperglycemia: Secondary | ICD-10-CM

## 2022-12-27 DIAGNOSIS — N1831 Chronic kidney disease, stage 3a: Secondary | ICD-10-CM | POA: Insufficient documentation

## 2022-12-27 DIAGNOSIS — N182 Chronic kidney disease, stage 2 (mild): Secondary | ICD-10-CM | POA: Diagnosis not present

## 2022-12-27 DIAGNOSIS — E669 Obesity, unspecified: Secondary | ICD-10-CM | POA: Diagnosis not present

## 2022-12-27 LAB — POCT GLYCOSYLATED HEMOGLOBIN (HGB A1C): Hemoglobin A1C: 6.3 % — AB (ref 4.0–5.6)

## 2022-12-27 NOTE — Progress Notes (Signed)
Patient ID: Krista Benjamin, female   DOB: January 29, 1951, 72 y.o.   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  Last visit 4 months ago. She has [place].   Interim history: No increased urination, blurry vision, nausea, chest pain.   She has sciatica >> had 2 steroid inj and then PT. no injections since last visit. She now wears a brace. Works from 7-7:45 and from 2-2:45 pm.  Reviewed HbA1c levels: Lab Results  Component Value Date   HGBA1C 6.7 (A) 08/25/2022   HGBA1C 7.2 (H) 04/01/2022   HGBA1C 6.2 (A) 12/01/2021  Prev. 5.9% on 12/2012, previously 6.1%.  Pt is on a regimen of: - Metformin ER 1000 mg 2x a day with meals  - Tradjenta 5 >> Januvia 100 mg before breakfast - per insurance pref. - Amaryl 2 mg before dinner >> at bedtime >> 0.5-1 mg before solid meals   Other medications tried: Rec'd GLP1 agonist >> Victoza covered >> she was afraid of SEs. We tried Jardiance 10 mg daily in am - added 04/2017 >> yeast inf >> stopped We tried Invokana 100 mg in am >> stopped after 3 days >> nausea, diarrhea We stopped Lantus 10 units qhs. We stopped Actos 45 mg (was on it since 07/2001) Metformin IR 1000 mg bid >> stopped b/c Nausea/Diarrhea She was on Novolog 8 units tid ac, when sugars >140  We stopped Amaryl 4 mg before breakfast in 11/2020 but had to restart 2/2 high CBGs.Marland Kitchen  Pt checks her sugars 1-2 times a day: - am:   67-116 >> 63, 80-123, 123 >> 86-135 >> 87-140 (no glimep.) - 2h after b'fast: 189 >> 115 >> n/c >> 130 after coffee >> n/c - lunch:  183 >> 71, 82 >> n/c >> 156 >> 79 >> n/c >> 67, 87-109 >> n/c - 2h after lunch: 303 >> n/c >> 201 >> 104 >> n/c - dinner: 171, 178, 195 (steroids) >> n/c >> 62 >> n/c - 2h after dinner: 197, 210, 231  >> 115-173 >> n/c - bedtime: 81 >>132, 364 (grapes) >> 134 >> 107,  211 >> n/c >> 127 - night: 184 >> 125, 128 >> n/c  >> 75 >> n/c >> 170 >> 145 Lowest sugar:  53 >> 104 >> 67; she has hypoglycemia awareness in the 70s. Highest sugar: 364 >> 211 >> 170  Glucometer: One Touch Verio Flex  She lost 80 pounds since 2012 >> She was able to come off insulin then. She saw Antonieta Iba (nutritionist) on 01/01/2015.  -+ Mild CKD. Latest BUN/creatinine was:  Lab Results  Component Value Date   BUN 31 (H) 04/01/2022   Lab Results  Component Value Date   CREATININE 1.17 04/01/2022   Lab Results  Component Value Date   GFR 46.80 (L) 04/01/2022   GFR 62.11 03/09/2021   GFR 68.89 04/25/2019   GFR 81.11 04/20/2018   GFR 75.32 11/28/2017   GFR 100.72 02/20/2017   GFR 69.70 06/02/2016   GFR 85.07 08/26/2015   GFR 111.88 09/22/2014   GFR 86.81 07/15/2013   On Entresto.  Lab Results  Component Value Date   MICRALBCREAT 3.4 11/20/2020   MICRALBCREAT 2.3 08/26/2015   MICRALBCREAT 5.5 09/22/2014   MICRALBCREAT 5.4 11/21/2007   -+ HL.  Latest lipid profile: Lab Results  Component Value Date   CHOL 159 04/01/2022  HDL 52.50 04/01/2022   LDLCALC 80 04/01/2022   TRIG 131.0 04/01/2022   CHOLHDL 3 04/01/2022  On Lipitor 40.  - last eye exam: 06/09/2022: No DR; previous history of background DR; + h/o B cataract sx.  - no numbness and tingling in her legs.  Last foot exam 12/01/2021.  She also has a history of HTN, iron deficiency anemia-on iron therapy, aortic valve disease/murmur, left ventricular hypertrophy per EKG, obesity, multinodular goiter, fibroids.  She had shingles in 11/2014 and 02/2016.  She fell and fractured R wrist 06/2018. She had another, right radial, fracture 01/2019. She was in the ED on 10/05/2021 with hypertensive urgency (systolic BP in the [id number]).  ROS: + See HPI  I reviewed pt's medications, allergies, PMH, social hx, family hx, and changes were documented in the history of present illness. Otherwise, unchanged from my  initial visit note.  Past Medical History:  Diagnosis Date   Anemia    Aortic stenosis    Mild   Arthritis    Cataract    both eyes  (cataracts removed)   Diabetes mellitus    sees Dr. Cruzita Lederer    Gynecological examination    sees Dr. Nori Riis    Hyperlipidemia    Hypertension    sees Dr. Marijo File   Stroke [facility])    TIA   Past Surgical History:  Procedure Laterality Date   CARPAL TUNNEL RELEASE  01/22/14   Left Hand    CATARACT EXTRACTION     Lens surgery   COLONOSCOPY  02/06/2018   per Dr. Loletha Carrow, no polyps, repeat in 5 yrs (colon CA in mother)    excision of benign cyst     from left maxilla and left maxillary sinus   KNEE SURGERY     RIGHT/LEFT HEART CATH AND CORONARY ANGIOGRAPHY N/A 09/14/2018   Procedure: RIGHT/LEFT HEART CATH AND CORONARY ANGIOGRAPHY;  Surgeon: Martinique, Peter M, MD;  Location: [facility];  Service: Cardiovascular;  Laterality: N/A;   SHOULDER ARTHROSCOPY W/ ROTATOR CUFF REPAIR Left 09-23-13   per Dr. Devonne Doughty at [facility] in [place] History   Socioeconomic History   Marital status: Married    Spouse name: Not on file   Number of children: Not on file   Years of education: Not on file   Highest education level: Not on file  Occupational History   Not on file  Tobacco Use   Smoking status: Never   Smokeless tobacco: Never  Vaping Use   Vaping Use: Never used  Substance and Sexual Activity   Alcohol use: No    Alcohol/week: 0.0 standard drinks of alcohol   Drug use: No   Sexual activity: Not on file  Other Topics Concern   Not on file  Social History Narrative   Not on file   Social Determinants of Health   Financial Resource Strain: Low Risk  (05/02/2022)   Overall Financial Resource Strain (CARDIA)    Difficulty of Paying Living Expenses: Not hard at all  Food Insecurity: No Food Insecurity (05/02/2022)   Hunger Vital Sign    Worried About Running Out of Food in the Last Year: Never true    [place] in the Last Year: Never true  Transportation Needs: No Transportation Needs (05/02/2022)   PRAPARE - Hydrologist (Medical): No    Lack of Transportation (Non-Medical): No  Physical Activity: Inactive (05/02/2022)   Exercise Vital  Sign    Days of Exercise per Week: 0 days    Minutes of Exercise per Session: 0 min  Stress: No Stress Concern Present (05/02/2022)   [place]    Feeling of Stress : Not at all  Social Connections: [place] (05/02/2022)   Social Connection and Isolation Panel [NHANES]    Frequency of Communication with Friends and Family: More than three times a week    Frequency of Social Gatherings with Friends and Family: More than three times a week    Attends Religious Services: More than 4 times per year    Active Member of [employer] or Organizations: Yes    Attends Music therapist: More than 4 times per year    Marital Status: Married  Human resources officer Violence: Not At Risk (05/02/2022)   Humiliation, Afraid, Rape, and Kick questionnaire    Fear of Current or Ex-Partner: No    Emotionally Abused: No    Physically Abused: No    Sexually Abused: No   Current Outpatient Medications on File Prior to Visit  Medication Sig Dispense Refill   albuterol (PROVENTIL) (2.5 MG/3ML) 0.083% nebulizer solution Take 3 mLs (2.5 mg total) by nebulization every 6 (six) hours as needed for wheezing or shortness of breath. 75 mL 12   albuterol (VENTOLIN HFA) 108 (90 Base) MCG/ACT inhaler Inhale 1-2 puffs into the lungs every 4 (four) hours as needed for wheezing or shortness of breath. 1 each 11   Alcohol Swabs (ALCOHOL PREP) 70 % PADS USE TO CHECK BLOOD SUGAR ONCE DAILY 200 each 3   aspirin EC 81 MG tablet Take 81 mg by mouth at bedtime. Swallow whole.     atorvastatin (LIPITOR) 40 MG tablet Take 1 tablet (40 mg total) by mouth at bedtime. 90 tablet 1    azithromycin (ZITHROMAX Z-PAK) 250 MG tablet As directed 6 each 0   Blood Glucose Monitoring Suppl (TRUE METRIX GO GLUCOSE METER) w/Device KIT Use as instructed to check blood sugar 2 times daily 1 kit 0   bumetanide (BUMEX) 1 MG tablet TAKE 2 TABLETS AT BEDTIME 180 tablet 3   cholecalciferol (VITAMIN D) 1000 UNITS tablet Take 2,000 Units by mouth at bedtime.     diclofenac (VOLTAREN) 75 MG EC tablet Take 1 tablet (75 mg total) by mouth 2 (two) times daily as needed for moderate pain. 60 tablet 5   diclofenac Sodium (VOLTAREN) 1 % GEL Apply 1 application topically 4 (four) times daily. 100 g 0   EPIPEN 2-PAK 0.3 MG/0.3ML SOAJ injection INJECT 0.3 MLS (0.3 MG TOTAL) INTO THE MUSCLE ONCE. 1 Device 0   ferrous gluconate (FERGON) 324 MG tablet Take 1 tablet (324 mg total) by mouth daily. 90 tablet 0   Fexofenadine HCl (ALLEGRA PO) Take 1 tablet by mouth daily at 12 noon.     glimepiride (AMARYL) 1 MG tablet Take 2 tablets (2 mg total) by mouth daily with breakfast. 180 tablet 3   glucose blood (TRUE METRIX BLOOD GLUCOSE TEST) test strip USE AS INSTRUCTED TO CHECK BLOOD SUGAR TWICE DAILY E11.65 200 strip 3   hydrALAZINE (APRESOLINE) 100 MG tablet Take 1 tablet (100 mg total) by mouth 3 (three) times daily. 270 tablet 3   HYDROcodone bit-homatropine (HYCODAN) 5-1.5 MG/5ML syrup Take 5 mLs by mouth every 4 (four) hours as needed for cough. 240 mL 0   HYDROcodone-acetaminophen (NORCO/VICODIN) 5-325 MG tablet Take 1-2 tablets by mouth every 6 (six) hours  as needed for moderate pain or severe pain. 12 tablet 0   isosorbide mononitrate (IMDUR) 120 MG 24 hr tablet TAKE 1 TABLET DAILY 90 tablet 3   ketoconazole (NIZORAL) 2 % cream APPLY TO AFFECTED AREA DAILY 30 g 5   metFORMIN (GLUCOPHAGE-XR) 500 MG 24 hr tablet TAKE 2 TABLETS TWICE A DAY WITH MEALS 360 tablet 3   methocarbamol (ROBAXIN) 500 MG tablet Take 1 tablet (500 mg total) by mouth every 8 (eight) hours as needed for muscle spasms. 90 tablet 2    methylPREDNISolone (MEDROL DOSEPAK) 4 MG TBPK tablet As directed 21 tablet 0   metoprolol succinate (TOPROL-XL) 100 MG 24 hr tablet TAKE 1 TABLET BY MOUTH DAILY. TAKE WITH OR IMMEDIATELY FOLLOWING A MEAL. 90 tablet 1   mupirocin ointment (BACTROBAN) 2 % Place 1 application into the nose 2 (two) times daily. 22 g 1   polyvinyl alcohol (LIQUIFILM TEARS) 1.4 % ophthalmic solution Place 1 drop into both eyes daily as needed for dry eyes.     sacubitril-valsartan (ENTRESTO) 97-103 MG Take 1 tablet by mouth 2 (two) times daily. 90 tablet 3   sitaGLIPtin (JANUVIA) 100 MG tablet Take 1 tablet (100 mg total) by mouth daily. 30 tablet 0   TRUEplus Lancets 33G MISC USE AS INSTRUCTED TO CHECK BLOOD SUGAR 2 TIMES DAILY 200 each 3   Current Facility-Administered Medications on File Prior to Visit  Medication Dose Route Frequency Provider Last Rate Last Admin   sacubitril-valsartan (ENTRESTO) 97-103 mg per tablet  1 tablet Oral BID Minus Breeding, MD       Allergies  Allergen Reactions   Bee Venom Anaphylaxis   Family History  Problem Relation Age of Onset   Coronary artery disease Brother    Coronary artery disease Mother    Colon cancer Mother    Cancer Other        fhx   Diabetes Other        fhx   Hyperlipidemia Other        fhx   Hypertension Other        fhx   Stroke Other        fhx   Colon polyps Neg Hx    Esophageal cancer Neg Hx    Rectal cancer Neg Hx    Stomach cancer Neg Hx    PE: BP (!) 132/90   Pulse 80   Ht 5\' 3"  (1.6 m)   Wt 245 lb 3.2 oz (111.2 kg)   SpO2 97%   BMI 43.44 kg/m    Wt Readings from Last 3 Encounters:  12/27/22 245 lb 3.2 oz (111.2 kg)  08/25/22 248 lb 12.8 oz (112.9 kg)  05/02/22 239 lb (108.4 kg)   Constitutional: overweight, in NAD Eyes:EOMI, no exophthalmos ENT: no thyromegaly, no cervical lymphadenopathy Cardiovascular: RRR, No MRG, + B LE edema. Respiratory: CTA B Musculoskeletal: no deformities Skin: no rashes Neurological: no tremor  with outstretched hands Diabetic Foot Exam - Simple   Simple Foot Form Diabetic Foot exam was performed with the following findings: Yes 12/27/2022  8:52 AM  Visual Inspection No deformities, no ulcerations, no other skin breakdown bilaterally: Yes Sensation Testing Intact to touch and monofilament testing bilaterally: Yes Pulse Check See comments: Yes Comments Decreased pedal pulses B  - poss. 2/2 mild B foot edema    ASSESSMENT: 1. DM2, non-insulin-dependent, uncontrolled, with complications - CKD stage 2 - background DR  2.  Obesity class III  3. HL  PLAN:  1.  Patient with longstanding, uncontrolled, type 2 diabetes, on metformin and DPP 4 inhibitor, to which we added a sulfonylurea in 2022, when sugars increased to 300s.  At that time, she had dietary indiscretions ([place] grapes).  Sugars improved after stopping these, but increased again after she started losartan.  At last visit, sugars appear to be mostly at goal but upon questioning, she was taking Amaryl incorrectly, at bedtime, rather than before dinner.  We discussed about moving this 15 minutes before dinner.  Otherwise we did not change the regimen.  HbA1c was better, at 6.7%. -At today's visit, sugars are at goal mostly, except for the times that she has a larger dinner or forgets to take glimepiride before age.  For now, there is no need to change her regimen. - I advised her to:  Patient Instructions  Please continue: - Metformin ER 1000 mg 2x a day with meals  - Januvia 100 mg before breakfast - Amaryl 0.5-1 mg 15 min before dinner and Glucerna  Please return in 4 months with your sugar log.   - we checked her HbA1c: 6.3% (lower) - advised to check sugars at different times of the day - 1x a day, rotating check times - advised for yearly eye exams >> she is UTD - return to clinic in 4 months  2.  Obesity class III -She can is on metformin and Januvia which are both weight neutral.  She is also on low-dose  Amaryl which can be weight inducing. -She gained 12 pounds before the last 2 visits combined -She lost 3 pounds since last visit  3. HL -Reviewed latest lipid panel from 03/2022: LDL above our target of less than 70, otherwise fractions at goal: Lab Results  Component Value Date   CHOL 159 04/01/2022   HDL 52.50 04/01/2022   LDLCALC 80 04/01/2022   TRIG 131.0 04/01/2022   CHOLHDL 3 04/01/2022  -She  is on Lipitor 40 mg daily without side effects  Philemon Kingdom, MD PhD [facility] Endocrinology

## 2022-12-27 NOTE — Patient Instructions (Addendum)
Please continue: - Metformin ER 1000 mg 2x a day with meals  - Januvia 100 mg before breakfast - Amaryl 0.5-1 mg 15 min before dinner  Please return in 4 months with your sugar log.

## 2023-01-05 DIAGNOSIS — K5730 Diverticulosis of large intestine without perforation or abscess without bleeding: Secondary | ICD-10-CM | POA: Insufficient documentation

## 2023-01-12 ENCOUNTER — Ambulatory Visit (AMBULATORY_SURGERY_CENTER): Payer: Medicare Other

## 2023-01-12 VITALS — Ht 63.0 in | Wt 245.0 lb

## 2023-01-12 DIAGNOSIS — Z1211 Encounter for screening for malignant neoplasm of colon: Secondary | ICD-10-CM

## 2023-01-12 DIAGNOSIS — Z800 Family history of malignant neoplasm of digestive organs: Secondary | ICD-10-CM

## 2023-01-12 MED ORDER — NA SULFATE-K SULFATE-MG SULF 17.5-3.13-1.6 GM/177ML PO SOLN
1.0000 | Freq: Once | ORAL | 0 refills | Status: AC
Start: 2023-01-12 — End: 2023-01-12

## 2023-01-12 NOTE — Progress Notes (Signed)
No egg or soy allergy known to patient  No issues known to pt with past sedation with any surgeries or procedures Patient denies ever being told they had issues or difficulty with intubation  No FH of Malignant Hyperthermia Pt is not on diet pills Pt is not on  home 02  Pt is not on blood thinners  Pt denies issues with constipation  No A fib or A flutter Have any cardiac testing pending--no Pt instructed to use Singlecare.[email or web address] or GoodRx for a price reduction on prep   

## 2023-01-13 ENCOUNTER — Encounter: Payer: Self-pay | Admitting: Gastroenterology

## 2023-01-19 ENCOUNTER — Telehealth: Payer: Self-pay | Admitting: Gastroenterology

## 2023-01-19 NOTE — Telephone Encounter (Signed)
Rerouting due to closing out TE. Please advise on below message thank you

## 2023-01-19 NOTE — Telephone Encounter (Signed)
Thank you for the update.  I will defer to our anesthesia consultant regarding need for medical clearance prior to LEC procedure.  Last LVEF 50 to 55% September 2021.  Since the patient's last cardiology appointment May 2023, they have been working with her for blood pressure control, most recently by starting Sherryll Burger.  Ellwood Dense, MD

## 2023-01-19 NOTE — Telephone Encounter (Signed)
Patient called would like to know if medical clearance is needed prior to her scheduled procedure.

## 2023-01-19 NOTE — Telephone Encounter (Signed)
Pt is would like to know if she need clearance prior to her colonoscopy on 4/25. Per pt her heart function has been up and down the last couple of years while trying to adjust her medications. She does not report having any issues at this point and is scheduled to have her yearly check up in may. Pt last ECHO was two years ago EF at that time 50-55 %. Please advise. No red dot from Jonny Ruiz stating wether or not this patient is clear for LEC prior to her PV. Please advise?

## 2023-01-20 NOTE — Telephone Encounter (Signed)
Dr.  Myrtie Neither,  This pt is cleared for anesthetic care at [facility].  Regards,  Cathlyn Parsons

## 2023-01-31 ENCOUNTER — Telehealth: Payer: Self-pay | Admitting: Gastroenterology

## 2023-01-31 ENCOUNTER — Telehealth: Payer: Self-pay | Admitting: *Deleted

## 2023-01-31 NOTE — Telephone Encounter (Signed)
Spoke with pt about issues with prep instructions.Reviewed areas pt had questions about. After discussion pt states she understood now. Instructed pt to call if has any more questions or concerns.

## 2023-01-31 NOTE — Telephone Encounter (Signed)
Spoke with pt about issues with prep instructions.Reviewed areas pt had questions about. After discussion pt states she understood now. Instructed pt to call if has any more questions or concerns. 

## 2023-01-31 NOTE — Telephone Encounter (Signed)
Inbound cal from patient needing to be advised on prep instructions.

## 2023-02-02 ENCOUNTER — Ambulatory Visit (AMBULATORY_SURGERY_CENTER): Payer: Medicare Other | Admitting: Gastroenterology

## 2023-02-02 ENCOUNTER — Encounter: Payer: Self-pay | Admitting: Gastroenterology

## 2023-02-02 VITALS — BP 147/72 | HR 59 | Temp 97.1°F | Resp 13 | Ht 63.0 in | Wt 245.0 lb

## 2023-02-02 DIAGNOSIS — Z1211 Encounter for screening for malignant neoplasm of colon: Secondary | ICD-10-CM | POA: Diagnosis not present

## 2023-02-02 DIAGNOSIS — Z800 Family history of malignant neoplasm of digestive organs: Secondary | ICD-10-CM | POA: Diagnosis not present

## 2023-02-02 HISTORY — PX: COLONOSCOPY: SHX174

## 2023-02-02 MED ORDER — SODIUM CHLORIDE 0.9 % IV SOLN
500.0000 mL | Freq: Once | INTRAVENOUS | Status: DC
Start: 2023-02-02 — End: 2023-02-02

## 2023-02-02 NOTE — Progress Notes (Signed)
Pt's states no medical or surgical changes since previsit or office visit. 

## 2023-02-02 NOTE — Patient Instructions (Signed)
Resume all of your previous medicines as ordered.  YOU HAD AN ENDOSCOPIC PROCEDURE TODAY AT THE [facility]:   Refer to the procedure report that was given to you for any specific questions about what was found during the examination.  If the procedure report does not answer your questions, please call your gastroenterologist to clarify.  If you requested that your care partner not be given the details of your procedure findings, then the procedure report has been included in a sealed envelope for you to review at your convenience later.  YOU SHOULD EXPECT: Some feelings of bloating in the abdomen. Passage of more gas than usual.  Walking can help get rid of the air that was put into your GI tract during the procedure and reduce the bloating. If you had a lower endoscopy (such as a colonoscopy or flexible sigmoidoscopy) you may notice spotting of blood in your stool or on the toilet paper. If you underwent a bowel prep for your procedure, you may not have a normal bowel movement for a few days.  Please Note:  You might notice some irritation and congestion in your nose or some drainage.  This is from the oxygen used during your procedure.  There is no need for concern and it should clear up in a day or so.  SYMPTOMS TO REPORT IMMEDIATELY:  Following lower endoscopy (colonoscopy or flexible sigmoidoscopy):  Excessive amounts of blood in the stool  Significant tenderness or worsening of abdominal pains  Swelling of the abdomen that is new, acute  Fever of 100F or higher   For urgent or emergent issues, a gastroenterologist can be reached at any hour by calling [phone]. Do not use MyChart messaging for urgent concerns.    DIET:  We do recommend a small meal at first, but then you may proceed to your regular diet.  Drink plenty of fluids but you should avoid alcoholic beverages for 24 hours.  ACTIVITY:  You should plan to take it easy for the rest of today and you should NOT  DRIVE or use heavy machinery until tomorrow (because of the sedation medicines used during the test).    FOLLOW UP: Our staff will call the number listed on your records the next business day following your procedure.  We will call around 7:15- 8:00 am to check on you and address any questions or concerns that you may have regarding the information given to you following your procedure. If we do not reach you, we will leave a message.      SIGNATURES/CONFIDENTIALITY: You and/or your care partner have signed paperwork which will be entered into your electronic medical record.  These signatures attest to the fact that that the information above on your After Visit Summary has been reviewed and is understood.  Full responsibility of the confidentiality of this discharge information lies with you and/or your care-partner.

## 2023-02-02 NOTE — Progress Notes (Signed)
History and Physical:  This patient presents for endoscopic testing for: Encounter Diagnosis  Name Primary?   Family history of colon cancer in mother Yes   Mother Dx in her 50s No polyps last colonoscopy April 2019 No polyps 2009 Patient denies chronic abdominal pain, rectal bleeding, constipation or diarrhea.   Patient is otherwise without complaints or active issues today.   Past Medical History: Past Medical History:  Diagnosis Date   Anemia    Aortic stenosis    Mild   Arthritis    Cataract    both eyes  (cataracts removed)   COPD (chronic obstructive pulmonary disease)    Diabetes mellitus    sees Dr. Elvera Lennox    Gynecological examination    sees Dr. Jennette Kettle    Hyperlipidemia    Hypertension    sees Dr. Angelina Sheriff   Stroke    TIA     Past Surgical History: Past Surgical History:  Procedure Laterality Date   CARPAL TUNNEL RELEASE  01/22/14   Left Hand    CATARACT EXTRACTION     Lens surgery   COLONOSCOPY  02/06/2018   per Dr. Myrtie Neither, no polyps, repeat in 5 yrs (colon CA in mother)    excision of benign cyst     from left maxilla and left maxillary sinus   KNEE SURGERY     RIGHT/LEFT HEART CATH AND CORONARY ANGIOGRAPHY N/A 09/14/2018   Procedure: RIGHT/LEFT HEART CATH AND CORONARY ANGIOGRAPHY;  Surgeon: [place], Peter M, MD;  Location: [facility] INVASIVE CV LAB;  Service: Cardiovascular;  Laterality: N/A;   SHOULDER ARTHROSCOPY W/ ROTATOR CUFF REPAIR Left 09-23-13   per Dr. Delrae Sawyers at [facility] in [place]     Allergies: Allergies  Allergen Reactions   Bee Venom Anaphylaxis    Outpatient Meds: Current Outpatient Medications  Medication Sig Dispense Refill   Alcohol Swabs (ALCOHOL PREP) 70 % PADS USE TO CHECK BLOOD SUGAR ONCE DAILY 200 each 3   aspirin EC 81 MG tablet Take 81 mg by mouth at bedtime. Swallow whole.     atorvastatin (LIPITOR) 40 MG tablet Take 1 tablet (40 mg total) by mouth at bedtime. 90 tablet 1   Blood Glucose  Monitoring Suppl (TRUE METRIX GO GLUCOSE METER) w/Device KIT Use as instructed to check blood sugar 2 times daily 1 kit 0   bumetanide (BUMEX) 1 MG tablet TAKE 2 TABLETS AT BEDTIME 180 tablet 3   cholecalciferol (VITAMIN D) 1000 UNITS tablet Take 2,000 Units by mouth at bedtime.     Fexofenadine HCl (ALLEGRA PO) Take 1 tablet by mouth daily at 12 noon.     glimepiride (AMARYL) 1 MG tablet Take 2 tablets (2 mg total) by mouth daily with breakfast. 180 tablet 3   glucose blood (TRUE METRIX BLOOD GLUCOSE TEST) test strip USE AS INSTRUCTED TO CHECK BLOOD SUGAR TWICE DAILY E11.65 200 strip 3   hydrALAZINE (APRESOLINE) 100 MG tablet Take 1 tablet (100 mg total) by mouth 3 (three) times daily. (Patient taking differently: Take 50 mg by mouth 3 (three) times daily.) 270 tablet 3   isosorbide mononitrate (IMDUR) 120 MG 24 hr tablet TAKE 1 TABLET DAILY 90 tablet 3   metFORMIN (GLUCOPHAGE-XR) 500 MG 24 hr tablet TAKE 2 TABLETS TWICE A DAY WITH MEALS 360 tablet 3   metoprolol succinate (TOPROL-XL) 100 MG 24 hr tablet TAKE 1 TABLET BY MOUTH DAILY. TAKE WITH OR IMMEDIATELY FOLLOWING A MEAL. 90 tablet 1   Na Sulfate-K Sulfate-Mg Sulf 17.5-3.13-1.6  GM/177ML SOLN Take by mouth.     polyvinyl alcohol (LIQUIFILM TEARS) 1.4 % ophthalmic solution Place 1 drop into both eyes daily as needed for dry eyes.     sacubitril-valsartan (ENTRESTO) 97-103 MG Take 1 tablet by mouth 2 (two) times daily. 90 tablet 3   sitaGLIPtin (JANUVIA) 100 MG tablet Take 1 tablet (100 mg total) by mouth daily. 30 tablet 0   TRUEplus Lancets 33G MISC USE AS INSTRUCTED TO CHECK BLOOD SUGAR 2 TIMES DAILY 200 each 3   albuterol (PROVENTIL) (2.5 MG/3ML) 0.083% nebulizer solution Take 3 mLs (2.5 mg total) by nebulization every 6 (six) hours as needed for wheezing or shortness of breath. 75 mL 12   albuterol (VENTOLIN HFA) 108 (90 Base) MCG/ACT inhaler Inhale 1-2 puffs into the lungs every 4 (four) hours as needed for wheezing or shortness of breath. 1  each 11   diclofenac (VOLTAREN) 75 MG EC tablet Take 1 tablet (75 mg total) by mouth 2 (two) times daily as needed for moderate pain. 60 tablet 5   EPIPEN 2-PAK 0.3 MG/0.3ML SOAJ injection INJECT 0.3 MLS (0.3 MG TOTAL) INTO THE MUSCLE ONCE. (Patient not taking: Reported on 01/12/2023) 1 Device 0   Iron, Ferrous Sulfate, 325 (65 Fe) MG TABS Take 1 tablet by mouth daily.     ketoconazole (NIZORAL) 2 % cream APPLY TO AFFECTED AREA DAILY (Patient not taking: Reported on 01/12/2023) 30 g 5   methocarbamol (ROBAXIN) 500 MG tablet Take 1 tablet (500 mg total) by mouth every 8 (eight) hours as needed for muscle spasms. (Patient not taking: Reported on 01/12/2023) 90 tablet 2   mupirocin ointment (BACTROBAN) 2 % Place 1 application into the nose 2 (two) times daily. (Patient not taking: Reported on 01/12/2023) 22 g 1   Current Facility-Administered Medications  Medication Dose Route Frequency Provider Last Rate Last Admin   0.9 %  sodium chloride infusion  500 mL Intravenous Once Danis, Starr Lake III, MD          ___________________________________________________________________ Objective   Exam:  BP 136/68   Pulse 64   Temp (!) 97.1 F (36.2 C) (Temporal)   Ht  (1.6 m)   Wt 245 lb (111.1 kg)   SpO2 98%   BMI 43.40 kg/m   CV: regular , S1/S2 Resp: clear to auscultation bilaterally, normal RR and effort noted GI: soft, no tenderness, with active bowel sounds.   Assessment: Encounter Diagnosis  Name Primary?   Family history of colon cancer in mother Yes     Plan: Colonoscopy  The benefits and risks of the planned procedure were described in detail with the patient or (when appropriate) their health care proxy.  Risks were outlined as including, but not limited to, bleeding, infection, perforation, adverse medication reaction leading to cardiac or pulmonary decompensation, pancreatitis (if ERCP).  The limitation of incomplete mucosal visualization was also discussed.  No guarantees or  warranties were given.    The patient is appropriate for an endoscopic procedure in the ambulatory setting.   - Amada Jupiter, MD

## 2023-02-02 NOTE — Progress Notes (Signed)
To pacu, VSS. Report to [email or web address] 

## 2023-02-02 NOTE — Op Note (Signed)
[facility] Patient Name: Krista Benjamin Procedure Date: 02/02/2023 9:24 AM MRN: [id number] Endoscopist: Sherilyn Cooter L. Myrtie Neither , MD, [id number] Age: 72 Referring MD:  Date of Birth: [birth year] Gender: Female Account #: [id number] Procedure:                Colonoscopy Indications:              Screening in patient at increased risk: Colorectal                            cancer in mother 52 or older                           No polyps on last colonoscopy April 2019                           No polyps 2009 Medicines:                Monitored Anesthesia Care Procedure:                Pre-Anesthesia Assessment:                           - Prior to the procedure, a History and Physical                            was performed, and patient medications and                            allergies were reviewed. The patient's tolerance of                            previous anesthesia was also reviewed. The risks                            and benefits of the procedure and the sedation                            options and risks were discussed with the patient.                            All questions were answered, and informed consent                            was obtained. Prior Anticoagulants: The patient has                            taken no anticoagulant or antiplatelet agents. ASA                            Grade Assessment: III - A patient with severe                            systemic disease. After reviewing the risks and  benefits, the patient was deemed in satisfactory                            condition to undergo the procedure.                           After obtaining informed consent, the colonoscope                            was passed under direct vision. Throughout the                            procedure, the patient's blood pressure, pulse, and                            oxygen saturations were monitored continuously. The                             CF [id number] #[id number] was introduced through the anus                            and advanced to the the cecum, identified by                            appendiceal orifice and ileocecal valve. The                            colonoscopy was performed without difficulty. The                            patient tolerated the procedure well. The quality                            of the bowel preparation was good. The ileocecal                            valve, appendiceal orifice, and rectum were                            photographed. Scope In: 9:40:18 AM Scope Out: 9:51:59 AM Scope Withdrawal Time: 0 hours 8 minutes 2 seconds  Total Procedure Duration: 0 hours 11 minutes 41 seconds  Findings:                 The perianal and digital rectal examinations were                            normal.                           Repeat examination of right colon under NBI                            performed.  Diverticula were found in the entire colon.                           Anal papilla(e) were hypertrophied.                           There is no endoscopic evidence of polyps in the                            entire colon.                           The exam was otherwise without abnormality on                            direct and retroflexion views. Complications:            No immediate complications. Estimated Blood Loss:     Estimated blood loss: none. Impression:               - Diverticulosis in the entire examined colon.                           - Anal papilla(e) were hypertrophied.                           - The examination was otherwise normal on direct                            and retroflexion views.                           - No specimens collected. Recommendation:           - Patient has a contact number available for                            emergencies. The signs and symptoms of potential                            delayed complications  were discussed with the                            patient. Return to normal activities tomorrow.                            Written discharge instructions were provided to the                            patient.                           - Resume previous diet.                           - Continue present medications.                           -  No repeat screening colonoscopy recommended due                            to age, low risk polyp history and current                            guidelines. Joriel Streety L. Myrtie Neither, MD 02/02/2023 9:55:54 AM This report has been signed electronically.

## 2023-02-03 ENCOUNTER — Telehealth: Payer: Self-pay

## 2023-02-03 NOTE — Telephone Encounter (Signed)
  Follow up Call-     02/02/2023    8:50 AM  Call back number  Post procedure Call Back phone  # [phone]  Permission to leave phone message Yes     Patient questions:  Do you have a fever, pain , or abdominal swelling? No. Pain Score  0 *  Have you tolerated food without any problems? Yes.    Have you been able to return to your normal activities? Yes.    Do you have any questions about your discharge instructions: Diet   No. Medications  No. Follow up visit  No.  Do you have questions or concerns about your Care? No.  Actions: * If pain score is 4 or above: No action needed, pain <4.

## 2023-02-14 ENCOUNTER — Other Ambulatory Visit: Payer: Self-pay | Admitting: Family Medicine

## 2023-02-14 ENCOUNTER — Other Ambulatory Visit: Payer: Self-pay | Admitting: Cardiology

## 2023-02-23 ENCOUNTER — Telehealth: Payer: Self-pay | Admitting: Cardiology

## 2023-02-23 MED ORDER — METOPROLOL SUCCINATE ER 100 MG PO TB24: ORAL_TABLET | ORAL | 0 refills | Status: DC

## 2023-02-23 NOTE — Telephone Encounter (Signed)
*  STAT* If patient is at the pharmacy, call can be transferred to refill team.   1. Which medications need to be refilled? (please list name of each medication and dose if known) metoprolol succinate (TOPROL-XL) 100 MG 24 hr tablet   2. Which pharmacy/location (including street and city if local pharmacy) is medication to be sent to?  [facility] - [place], [place] - [address]    3. Do they need a 30 day or 90 day supply? 90

## 2023-02-23 NOTE — Telephone Encounter (Signed)
15 day supply sent until pt's upcoming appt.

## 2023-03-07 ENCOUNTER — Telehealth: Payer: Self-pay | Admitting: Family Medicine

## 2023-03-07 NOTE — Telephone Encounter (Signed)
Pt received another jury summons letter for [place] and would like a letter . Pt has been summons to go to jury in [place]

## 2023-03-08 NOTE — Telephone Encounter (Signed)
Pt is aware that Dr Clent Ridges is out of the office until Monday. Pt is ok waiting for PCP to get back to the office. Pt will be notified soon as Dr Clent Ridges approved the letter. Please advise

## 2023-03-08 NOTE — Progress Notes (Unsigned)
  Cardiology Office Note:   Date:  03/09/2023  ID:  Krista Benjamin, DOB 01-May-1951, MRN [id number]  History of Present Illness:   Krista Benjamin is a 72 y.o. female who presents for followup of a mildly reduced  EF 40 - 45%.   She had a cath and had minimal CAD.   Her EF was lower at 35 - 40%.     Follow-up echo demonstrated the EF to be up to 50 to 55%.  That echo was 2021.    Since I saw her she has done well. The patient denies any new symptoms such as chest discomfort, neck or arm discomfort. There has been no new shortness of breath, PND or orthopnea. There have been no reported palpitations, presyncope or syncope.  She still works as a Chief Strategy Officer   ROS: As stated in the HPI and negative for all other systems.  Studies Reviewed:    EKG: Sinus rhythm, left bundle branch block, rate 72, premature atrial contractions.   Risk Assessment/Calculations:              Physical Exam:   VS:  BP 134/84   Pulse 72   Ht 5\' 3"  (1.6 m)   Wt 249 lb (112.9 kg)   SpO2 96%   BMI 44.11 kg/m    Wt Readings from Last 3 Encounters:  03/09/23 249 lb (112.9 kg)  02/02/23 245 lb (111.1 kg)  01/12/23 245 lb (111.1 kg)     GEN: Well nourished, well developed in no acute distress NECK: No JVD; bilateral carotid bruits CARDIAC: RRR, 2 out of 6 brief apical systolic murmur radiating slightly at aortic outflow tract murmurs, rubs, gallops RESPIRATORY:  Clear to auscultation without rales, wheezing or rhonchi  ABDOMEN: Soft, non-tender, non-distended EXTREMITIES:  Mild edema; No deformity   ASSESSMENT AND PLAN:   CARDIOMYOPATHY:   I will check an echocardiogram as it has been several years.  She seems to be a little euvolemic.  She is on goal-directed medical therapy.  HTN:  Her blood pressure is at target.  We have finally achieved good control.  No change in therapy.   DM:   Her A1c is 6.3.  No change in therapy.   BRUITS: I will check carotid Dopplers.  She had mild disease  several years ago.      Signed, Rollene Rotunda, MD

## 2023-03-09 ENCOUNTER — Encounter: Payer: Self-pay | Admitting: Cardiology

## 2023-03-09 ENCOUNTER — Ambulatory Visit: Payer: Medicare Other | Attending: Cardiology | Admitting: Cardiology

## 2023-03-09 VITALS — BP 134/84 | HR 72 | Ht 63.0 in | Wt 249.0 lb

## 2023-03-09 DIAGNOSIS — R0989 Other specified symptoms and signs involving the circulatory and respiratory systems: Secondary | ICD-10-CM | POA: Diagnosis not present

## 2023-03-09 DIAGNOSIS — I10 Essential (primary) hypertension: Secondary | ICD-10-CM | POA: Diagnosis not present

## 2023-03-09 DIAGNOSIS — E118 Type 2 diabetes mellitus with unspecified complications: Secondary | ICD-10-CM

## 2023-03-09 DIAGNOSIS — Z7984 Long term (current) use of oral hypoglycemic drugs: Secondary | ICD-10-CM

## 2023-03-09 DIAGNOSIS — I420 Dilated cardiomyopathy: Secondary | ICD-10-CM | POA: Diagnosis not present

## 2023-03-09 NOTE — Patient Instructions (Signed)
Medication Instructions:  The current medical regimen is effective;  continue present plan and medications.  *If you need a refill on your cardiac medications before your next appointment, please call your pharmacy*   Testing/Procedures: Echocardiogram - Your physician has requested that you have an echocardiogram. Echocardiography is a painless test that uses sound waves to create images of your heart. It provides your doctor with information about the size and shape of your heart and how well your heart's chambers and valves are working. This procedure takes approximately one hour. There are no restrictions for this procedure.   Your physician has requested that you have a carotid duplex. This test is an ultrasound of the carotid arteries in your neck. It looks at blood flow through these arteries that supply the brain with blood. Allow one hour for this exam. There are no restrictions or special instructions.    Follow-Up: At [facility], you and your health needs are our priority.  As part of our continuing mission to provide you with exceptional heart care, we have created designated Provider Care Teams.  These Care Teams include your primary Cardiologist (physician) and Advanced Practice Providers (APPs -  Physician Assistants and Nurse Practitioners) who all work together to provide you with the care you need, when you need it.  We recommend signing up for the patient portal called "MyChart".  Sign up information is provided on this After Visit Summary.  MyChart is used to connect with patients for Virtual Visits (Telemedicine).  Patients are able to view lab/test results, encounter notes, upcoming appointments, etc.  Non-urgent messages can be sent to your provider as well.   To learn more about what you can do with MyChart, go to [email or web address].    Your next appointment:   12 month(s)  Provider:   Rollene Rotunda, MD

## 2023-03-13 NOTE — Telephone Encounter (Signed)
The letter is ready  

## 2023-03-14 ENCOUNTER — Telehealth: Payer: Self-pay | Admitting: Cardiology

## 2023-03-14 MED ORDER — METOPROLOL SUCCINATE ER 100 MG PO TB24: ORAL_TABLET | ORAL | 3 refills | Status: DC

## 2023-03-14 NOTE — Telephone Encounter (Signed)
Attempted to call Pt but mailbox is full will try again later

## 2023-03-14 NOTE — Telephone Encounter (Signed)
*  STAT* If patient is at the pharmacy, call can be transferred to refill team.   1. Which medications need to be refilled? (please list name of each medication and dose if known) Metoprolol  2. Which pharmacy/location (including street and city if local pharmacy) is medication to be sent to?[facility]  3. Do they need a 30 day or 90 day supply? 90 days and refills- patient had an appointment on 03-09-23 with Dr [place] Lions- she was given 15 pills until she saw hims

## 2023-03-14 NOTE — Telephone Encounter (Signed)
Pt's medication was sent to pt's pharmacy as requested. Confirmation received.  °

## 2023-03-15 NOTE — Telephone Encounter (Signed)
Spoke with pt requested to mail out the letter to her address. Letter put in outgoing mails this morning

## 2023-03-27 ENCOUNTER — Other Ambulatory Visit: Payer: Self-pay | Admitting: Cardiology

## 2023-04-03 ENCOUNTER — Encounter: Payer: Self-pay | Admitting: Cardiology

## 2023-04-03 ENCOUNTER — Telehealth: Payer: Self-pay | Admitting: Cardiology

## 2023-04-03 MED ORDER — ENTRESTO 97-103 MG PO TABS: 1.0000 | ORAL_TABLET | Freq: Two times a day (BID) | ORAL | 0 refills | Status: DC

## 2023-04-03 NOTE — Telephone Encounter (Signed)
Spoke with pt regarding her entresto. Pt states that she was finally able to speak with someone at [facility] regarding her prescription, she says that they are unable to rush her delivery this time because it is already being processed. Pt would like a 5 day supply of medication sent to local pharmacy. Prescription sent to pt's preferred pharmacy. Pt states that [facility] expects her prescription will be at her house by Wednesday. She will call back if she needs additional assistance.

## 2023-04-03 NOTE — Telephone Encounter (Signed)
Replaced prescription. Called pharmacy to insure prescription was received. Vonna Kotyk at [facility] confirms that they received the prescription and would process.   Called pt back to let her know this.

## 2023-04-03 NOTE — Telephone Encounter (Signed)
Multiple encounters open. Please see telephone encounter for more details. Closing encounter.

## 2023-04-03 NOTE — Telephone Encounter (Signed)
Patient calling back. She says the pharmacy has not received the medication and she does not have anymore.

## 2023-04-03 NOTE — Telephone Encounter (Signed)
Patient calling the office for samples of medication:   1.  What medication and dosage are you requesting samples for?  sacubitril-valsartan (ENTRESTO) 97-103 MG 2.  Are you currently out of this medication?   Yes  Pt states she did not receive medication from [facility] and took last tablet this morning

## 2023-04-03 NOTE — Addendum Note (Signed)
Addended by: Bernita Buffy on: 04/03/2023 04:47 PM   Modules accepted: Orders

## 2023-04-04 ENCOUNTER — Encounter: Payer: Medicare Other | Admitting: Family Medicine

## 2023-04-04 ENCOUNTER — Encounter: Payer: Self-pay | Admitting: Family Medicine

## 2023-04-04 ENCOUNTER — Ambulatory Visit (INDEPENDENT_AMBULATORY_CARE_PROVIDER_SITE_OTHER): Payer: Medicare Other | Admitting: Family Medicine

## 2023-04-04 VITALS — BP 136/80 | HR 63 | Temp 98.2°F | Ht 63.0 in | Wt 239.0 lb

## 2023-04-04 DIAGNOSIS — N182 Chronic kidney disease, stage 2 (mild): Secondary | ICD-10-CM

## 2023-04-04 DIAGNOSIS — D508 Other iron deficiency anemias: Secondary | ICD-10-CM | POA: Diagnosis not present

## 2023-04-04 DIAGNOSIS — I10 Essential (primary) hypertension: Secondary | ICD-10-CM

## 2023-04-04 DIAGNOSIS — G5601 Carpal tunnel syndrome, right upper limb: Secondary | ICD-10-CM

## 2023-04-04 DIAGNOSIS — E1140 Type 2 diabetes mellitus with diabetic neuropathy, unspecified: Secondary | ICD-10-CM

## 2023-04-04 DIAGNOSIS — J40 Bronchitis, not specified as acute or chronic: Secondary | ICD-10-CM

## 2023-04-04 DIAGNOSIS — E1122 Type 2 diabetes mellitus with diabetic chronic kidney disease: Secondary | ICD-10-CM | POA: Diagnosis not present

## 2023-04-04 DIAGNOSIS — Z7984 Long term (current) use of oral hypoglycemic drugs: Secondary | ICD-10-CM

## 2023-04-04 DIAGNOSIS — I420 Dilated cardiomyopathy: Secondary | ICD-10-CM

## 2023-04-04 DIAGNOSIS — M65331 Trigger finger, right middle finger: Secondary | ICD-10-CM

## 2023-04-04 DIAGNOSIS — E782 Mixed hyperlipidemia: Secondary | ICD-10-CM

## 2023-04-04 LAB — LIPID PANEL
Cholesterol: 150 mg/dL (ref 0–200)
HDL: 48.4 mg/dL (ref >39.00)
LDL Cholesterol: 84 mg/dL (ref 0–99)
NonHDL: 101.43
Total CHOL/HDL Ratio: 3
Triglycerides: 87 mg/dL (ref 0.0–149.0)
VLDL: 17.4 mg/dL (ref 0.0–40.0)

## 2023-04-04 LAB — CBC WITH DIFFERENTIAL/PLATELET
Basophils Absolute: 0 10*3/uL (ref 0.0–0.1)
Basophils Relative: 0.3 % (ref 0.0–3.0)
Eosinophils Absolute: 0.1 10*3/uL (ref 0.0–0.7)
Eosinophils Relative: 0.9 % (ref 0.0–5.0)
HCT: 34.1 % — ABNORMAL LOW (ref 36.0–46.0)
Hemoglobin: 10.8 g/dL — ABNORMAL LOW (ref 12.0–15.0)
Lymphocytes Relative: 22.7 % (ref 12.0–46.0)
Lymphs Abs: 2.3 10*3/uL (ref 0.7–4.0)
MCHC: 31.6 g/dL (ref 30.0–36.0)
MCV: 78.9 fl (ref 78.0–100.0)
Monocytes Absolute: 0.5 10*3/uL (ref 0.1–1.0)
Monocytes Relative: 5.2 % (ref 3.0–12.0)
Neutro Abs: 7.3 10*3/uL (ref 1.4–7.7)
Neutrophils Relative %: 70.9 % (ref 43.0–77.0)
Platelets: 253 10*3/uL (ref 150.0–400.0)
RBC: 4.33 Mil/uL (ref 3.87–5.11)
RDW: 15.1 % (ref 11.5–15.5)
WBC: 10.3 10*3/uL (ref 4.0–10.5)

## 2023-04-04 LAB — BASIC METABOLIC PANEL
BUN: 31 mg/dL — ABNORMAL HIGH (ref 6–23)
CO2: 26 mEq/L (ref 19–32)
Calcium: 10.2 mg/dL (ref 8.4–10.5)
Chloride: 107 mEq/L (ref 96–112)
Creatinine, Ser: 1.02 mg/dL (ref 0.40–1.20)
GFR: 54.79 mL/min — ABNORMAL LOW (ref >60.00)
Glucose, Bld: 119 mg/dL — ABNORMAL HIGH (ref 70–99)
Potassium: 4.6 mEq/L (ref 3.5–5.1)
Sodium: 141 mEq/L (ref 135–145)

## 2023-04-04 LAB — TSH: TSH: 0.79 u[IU]/mL (ref 0.35–5.50)

## 2023-04-04 LAB — HEPATIC FUNCTION PANEL
ALT: 10 U/L (ref 0–35)
AST: 17 U/L (ref 0–37)
Albumin: 4.2 g/dL (ref 3.5–5.2)
Alkaline Phosphatase: 66 U/L (ref 39–117)
Bilirubin, Direct: 0.1 mg/dL (ref 0.0–0.3)
Total Bilirubin: 0.5 mg/dL (ref 0.2–1.2)
Total Protein: 7.9 g/dL (ref 6.0–8.3)

## 2023-04-04 LAB — HEMOGLOBIN A1C: Hgb A1c MFr Bld: 6.9 % — ABNORMAL HIGH (ref 4.6–6.5)

## 2023-04-04 MED ORDER — ATORVASTATIN CALCIUM 40 MG PO TABS: 40.0000 mg | ORAL_TABLET | Freq: Every day | ORAL | 3 refills | Status: DC

## 2023-04-04 MED ORDER — ALBUTEROL SULFATE HFA 108 (90 BASE) MCG/ACT IN AERS
1.0000 | INHALATION_SPRAY | RESPIRATORY_TRACT | 11 refills | Status: AC | PRN
Start: 2023-04-04 — End: ?

## 2023-04-04 MED ORDER — ALBUTEROL SULFATE (2.5 MG/3ML) 0.083% IN NEBU: 2.5000 mg | INHALATION_SOLUTION | Freq: Four times a day (QID) | RESPIRATORY_TRACT | 12 refills | Status: DC | PRN

## 2023-04-04 NOTE — Progress Notes (Signed)
Subjective:    Patient ID: Krista Benjamin, female    DOB: 1950-11-24, 72 y.o.   MRN: [id number]  HPI Here to follow up on issues. She is doing well except for chronic pain in both knees and for issues with her right hand. She has had numbness and tingling in the fingers of her right hand, and she has been diagnosed with carpal tunnel syndrome. Now she also has trouble with the right third finger locking in a bent position. She saw Dr. 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 Her BP has been well controlled. She is aware of her mild CKD so she avoids taking any NSAIDs for her arthritis pains. She takes Tylenol for these.    Review of Systems  Constitutional: Negative.   HENT: Negative.    Eyes: Negative.   Respiratory: Negative.    Cardiovascular: Negative.   Gastrointestinal: Negative.   Genitourinary:  Negative for decreased urine volume, difficulty urinating, dyspareunia, dysuria, enuresis, flank pain, frequency, hematuria, pelvic pain and urgency.  Musculoskeletal:  Positive for arthralgias.  Skin: Negative.   Neurological:  Positive for numbness. Negative for headaches.  Psychiatric/Behavioral: Negative.         Objective:   Physical Exam Constitutional:      General: She is not in acute distress.    Appearance: She is well-developed. She is obese.     Comments: She has trouble getting up on the exam table due to knee pain  HENT:     Head: Normocephalic and atraumatic.     Right Ear: External ear normal.     Left Ear: External ear normal.     Nose: Nose normal.     Mouth/Throat:     Pharynx: No oropharyngeal exudate.  Eyes:     General: No scleral icterus.    Conjunctiva/sclera: Conjunctivae normal.     Pupils: Pupils are equal, round, and reactive to light.  Neck:     Thyroid: No thyromegaly.     Vascular: No JVD.  Cardiovascular:     Rate and Rhythm: Normal rate and regular rhythm.     Pulses: Normal pulses.     Heart sounds:  Normal heart sounds. No murmur heard.    No friction rub. No gallop.  Pulmonary:     Effort: Pulmonary effort is normal. No respiratory distress.     Breath sounds: Normal breath sounds. No wheezing or rales.  Chest:     Chest wall: No tenderness.  Abdominal:     General: Bowel sounds are normal. There is no distension.     Palpations: Abdomen is soft. There is no mass.     Tenderness: There is no abdominal tenderness. There is no guarding or rebound.  Musculoskeletal:        General: No tenderness. Normal range of motion.     Cervical back: Normal range of motion and neck supple.     Comments: 2+ edema in both ankles   Lymphadenopathy:     Cervical: No cervical adenopathy.  Skin:    General: Skin is warm and dry.     Findings: No erythema or rash.  Neurological:     General: No focal deficit present.     Mental Status: She is alert and oriented to person, place, and time.     Cranial Nerves: No cranial nerve deficit.     Motor: No abnormal muscle tone.     Coordination: Coordination normal.     Deep Tendon Reflexes: Reflexes  are normal and symmetric. Reflexes normal.  Psychiatric:        Mood and Affect: Mood normal.        Behavior: Behavior normal.        Thought Content: Thought content normal.        Judgment: Judgment normal.           Assessment & Plan:  She is doing well with her cardiomyopathy and HTN. She will takes Tylenol for the OA in her knees as needed. We will get fasting labs to check an A1c, renal function, lipids, etc. We spent a total of (35   ) minutes reviewing records and discussing these issues. For the carpal tunnel syndrome and the trigger finger we will refer her to Dr. Dairl Ponder. We spent a total of (35   ) minutes reviewing records and discussing these issues.  Gershon Crane, MD  Gershon Crane, MD

## 2023-04-10 ENCOUNTER — Ambulatory Visit (INDEPENDENT_AMBULATORY_CARE_PROVIDER_SITE_OTHER): Payer: Medicare Other

## 2023-04-10 DIAGNOSIS — I420 Dilated cardiomyopathy: Secondary | ICD-10-CM

## 2023-04-10 DIAGNOSIS — R0989 Other specified symptoms and signs involving the circulatory and respiratory systems: Secondary | ICD-10-CM

## 2023-04-10 LAB — ECHOCARDIOGRAM COMPLETE
Area-P 1/2: 2.83 cm2
MV M vel: 2.55 m/s
MV Peak grad: 26 mmHg
S' Lateral: 2.84 cm

## 2023-04-17 ENCOUNTER — Telehealth: Payer: Self-pay | Admitting: Family Medicine

## 2023-04-17 NOTE — Telephone Encounter (Signed)
Attempted to contact pt regarding the message but voicemail is full, will try again in the morning

## 2023-04-17 NOTE — Telephone Encounter (Signed)
Pt calling in for address to an appointment on [address]. Says they called her and she cannot remember where to go. Tried to assist by asking questions, she seemed unsure

## 2023-04-19 NOTE — Telephone Encounter (Signed)
Spoke with pt stated that she got the information regarding the address to her eye Dr

## 2023-04-25 ENCOUNTER — Encounter: Payer: Self-pay | Admitting: *Deleted

## 2023-05-22 ENCOUNTER — Other Ambulatory Visit: Payer: Self-pay | Admitting: Internal Medicine

## 2023-05-22 DIAGNOSIS — E1165 Type 2 diabetes mellitus with hyperglycemia: Secondary | ICD-10-CM

## 2023-05-25 ENCOUNTER — Encounter: Payer: Self-pay | Admitting: Internal Medicine

## 2023-05-25 ENCOUNTER — Ambulatory Visit: Payer: Medicare Other | Admitting: Internal Medicine

## 2023-05-25 VITALS — BP 146/80 | HR 71 | Ht 63.0 in | Wt 237.6 lb

## 2023-05-25 DIAGNOSIS — Z7985 Long-term (current) use of injectable non-insulin antidiabetic drugs: Secondary | ICD-10-CM

## 2023-05-25 DIAGNOSIS — E0822 Diabetes mellitus due to underlying condition with diabetic chronic kidney disease: Secondary | ICD-10-CM | POA: Diagnosis not present

## 2023-05-25 DIAGNOSIS — N1831 Chronic kidney disease, stage 3a: Secondary | ICD-10-CM | POA: Diagnosis not present

## 2023-05-25 DIAGNOSIS — E669 Obesity, unspecified: Secondary | ICD-10-CM | POA: Diagnosis not present

## 2023-05-25 DIAGNOSIS — E782 Mixed hyperlipidemia: Secondary | ICD-10-CM | POA: Diagnosis not present

## 2023-05-25 DIAGNOSIS — E119 Type 2 diabetes mellitus without complications: Secondary | ICD-10-CM

## 2023-05-25 DIAGNOSIS — Z7984 Long term (current) use of oral hypoglycemic drugs: Secondary | ICD-10-CM

## 2023-05-25 NOTE — Patient Instructions (Addendum)
Please continue: - Metformin ER 1000 mg 2x a day with meals  - Januvia 100 mg before breakfast - Amaryl 0.5-1 mg 15 min before a larger meal  Please return in 4 months with your sugar log.

## 2023-05-25 NOTE — Progress Notes (Signed)
Patient ID: Krista Benjamin, female   DOB: 16-Jul-1951, 72 y.o.   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  Last visit 5 months ago. She has [employer] + [employer].   Interim history: No increased urination, blurry vision, nausea, chest pain.   She has sciatica >> had steroid injections and PT in the past. No recent injection. She was working from 7-7:45 and from 2-2:45 pm. She stopped working >> will restart.  Reviewed HbA1c levels: Lab Results  Component Value Date   HGBA1C 6.9 (H) 04/04/2023   HGBA1C 6.3 (A) 12/27/2022   HGBA1C 6.7 (A) 08/25/2022  Prev. 5.9% on 12/2012, previously 6.1%.  Pt is on a regimen of: - Metformin ER 1000 mg 2x a day with meals  - Tradjenta 5 >> Januvia 100 mg before breakfast - per insurance pref. - Amaryl 2 mg before dinner >> at bedtime >> 0.5-1 mg before solid meals   Other medications tried: Rec'd GLP1 agonist >> Victoza covered >> she was afraid of SEs. We tried Jardiance 10 mg daily in am - added 04/2017 >> yeast inf >> stopped We tried Invokana 100 mg in am >> stopped after 3 days >> nausea, diarrhea We stopped Lantus 10 units qhs. We stopped Actos 45 mg (was on it since 07/2001) Metformin IR 1000 mg bid >> stopped b/c Nausea/Diarrhea She was on Novolog 8 units tid ac, when sugars >140  We stopped Amaryl 4 mg before breakfast in 11/2020 but had to restart 2/2 high CBGs.Marland Kitchen  Pt checks her sugars 1 times a day: - am: 63, 80-123, 123 >> 86-135 >> 87-140 (no glimep.) >> 80-137, 142 - 2h after b'fast: 189 >> 115 >> n/c >> 130 after coffee >> n/c - lunch:  183 >> 71, 82 >> n/c >> 156 >> 79 >> n/c >> 67, 87-109 >> n/c - 2h after lunch: 303 >> n/c >> 201 >> 104 >> n/c - dinner: 171, 178, 195 (steroids) >> n/c >> 62 >> n/c - 2h after dinner: 197, 210, 231  >> 115-173 >> n/c - bedtime: 132, 364  (grapes) >> 134 >> 107, 211 >> n/c >> 127 >> n/c - night: 125, 128 >> n/c  >> 75 >> n/c >> 170 >> 145 >> 100 Lowest sugar:  53 >> 104 >> 67>> 80; she has hypoglycemia awareness in the 70s. Highest sugar: 364 >> 211 >> 170 >> 142  Glucometer: One Touch Verio Flex  She lost 80 pounds since 2012 >> She was able to come off insulin then. She saw Oran Rein (nutritionist) on 01/01/2015.  -+ Mild CKD. Latest BUN/creatinine was:  Lab Results  Component Value Date   BUN 31 (H) 04/04/2023   Lab Results  Component Value Date   CREATININE 1.02 04/04/2023   Lab Results  Component Value Date   GFR 54.79 (L) 04/04/2023   GFR 46.80 (L) 04/01/2022   GFR 62.11 03/09/2021   GFR 68.89 04/25/2019   GFR 81.11 04/20/2018   GFR 75.32 11/28/2017   GFR 100.72 02/20/2017   GFR 69.70 06/02/2016   GFR 85.07 08/26/2015   GFR 111.88 09/22/2014   On Entresto.  Lab Results  Component Value Date   MICRALBCREAT 3.4 11/20/2020   MICRALBCREAT 2.3 08/26/2015   MICRALBCREAT 5.5 09/22/2014   MICRALBCREAT 5.4 11/21/2007   -+ HL.  Latest lipid profile: Lab Results  Component Value  Date   CHOL 150 04/04/2023   HDL 48.40 04/04/2023   LDLCALC 84 04/04/2023   TRIG 87.0 04/04/2023   CHOLHDL 3 04/04/2023  On Lipitor 40.  - last eye exam: 06/09/2022: No DR; previous history of background DR; + h/o B cataract sx.  - no numbness and tingling in her legs.  Last foot exam 12/27/2022.  She also has a history of HTN, iron deficiency anemia-on iron therapy, aortic valve disease/murmur, left ventricular hypertrophy per EKG, obesity, multinodular goiter, fibroids.  She had shingles in 11/2014 and 02/2016.  She fell and fractured R wrist 06/2018. She had another, right radial, fracture 01/2019. She was in the ED on 10/05/2021 with hypertensive urgency (systolic BP in the 200s).  ROS: + See HPI  I reviewed pt's medications, allergies, PMH, social hx, family hx, and changes were documented in the history of  present illness. Otherwise, unchanged from my initial visit note.  Past Medical History:  Diagnosis Date   Anemia    Aortic stenosis    Mild   Arthritis    Cataract    both eyes  (cataracts removed)   COPD (chronic obstructive pulmonary disease) (HCC)    Diabetes mellitus    sees Dr. Elvera Lennox    Gynecological examination    sees Dr. Jennette Kettle    Hyperlipidemia    Hypertension    sees Dr. Angelina Sheriff   Stroke [facility])    TIA   Past Surgical History:  Procedure Laterality Date   CARPAL TUNNEL RELEASE  01/22/2014   Left Hand    CATARACT EXTRACTION     Lens surgery   COLONOSCOPY  02/02/2023   per Dr. Myrtie Neither, no polyps, no repeats needed   excision of benign cyst     from left maxilla and left maxillary sinus   KNEE SURGERY     RIGHT/LEFT HEART CATH AND CORONARY ANGIOGRAPHY N/A 09/14/2018   Procedure: RIGHT/LEFT HEART CATH AND CORONARY ANGIOGRAPHY;  Surgeon: [place], Peter M, MD;  Location: [facility] INVASIVE CV LAB;  Service: Cardiovascular;  Laterality: N/A;   SHOULDER ARTHROSCOPY W/ ROTATOR CUFF REPAIR Left 09/23/2013   per Dr. Delrae Sawyers at [facility] center in [place]    Social History   Socioeconomic History   Marital status: Married    Spouse name: Not on file   Number of children: Not on file   Years of education: Not on file   Highest education level: Not on file  Occupational History   Not on file  Tobacco Use   Smoking status: Never   Smokeless tobacco: Never  Vaping Use   Vaping status: Never Used  Substance and Sexual Activity   Alcohol use: No    Alcohol/week: 0.0 standard drinks of alcohol   Drug use: No   Sexual activity: Not Currently    Birth control/protection: Post-menopausal  Other Topics Concern   Not on file  Social History Narrative   Not on file   Social Determinants of Health   Financial Resource Strain: Low Risk  (05/02/2022)   Overall Financial Resource Strain (CARDIA)    Difficulty of Paying Living Expenses: Not hard at all  Food  Insecurity: No Food Insecurity (05/02/2022)   Hunger Vital Sign    Worried About Running Out of Food in the Last Year: Never true    Ran Out of Food in the Last Year: Never true  Transportation Needs: No Transportation Needs (05/02/2022)   PRAPARE - Administrator, Civil Service (Medical): No  Lack of Transportation (Non-Medical): No  Physical Activity: Inactive (05/02/2022)   Exercise Vital Sign    Days of Exercise per Week: 0 days    Minutes of Exercise per Session: 0 min  Stress: No Stress Concern Present (05/02/2022)   [employer] of Occupational Health - Occupational Stress Questionnaire    Feeling of Stress : Not at all  Social Connections: Socially Integrated (05/02/2022)   Social Connection and Isolation Panel [NHANES]    Frequency of Communication with Friends and Family: More than three times a week    Frequency of Social Gatherings with Friends and Family: More than three times a week    Attends Religious Services: More than 4 times per year    Active Member of [employer] or Organizations: Yes    Attends Engineer, structural: More than 4 times per year    Marital Status: Married  Catering manager Violence: Not At Risk (05/02/2022)   Humiliation, Afraid, Rape, and Kick questionnaire    Fear of Current or Ex-Partner: No    Emotionally Abused: No    Physically Abused: No    Sexually Abused: No   Current Outpatient Medications on File Prior to Visit  Medication Sig Dispense Refill   albuterol (PROVENTIL) (2.5 MG/3ML) 0.083% nebulizer solution Take 3 mLs (2.5 mg total) by nebulization every 6 (six) hours as needed for wheezing or shortness of breath. 75 mL 12   albuterol (VENTOLIN HFA) 108 (90 Base) MCG/ACT inhaler Inhale 1-2 puffs into the lungs every 4 (four) hours as needed for wheezing or shortness of breath. 1 each 11   Alcohol Swabs (ALCOHOL PREP) 70 % PADS USE TO CHECK BLOOD SUGAR ONCE DAILY 200 each 3   aspirin EC 81 MG tablet Take 81 mg by mouth at  bedtime. Swallow whole.     atorvastatin (LIPITOR) 40 MG tablet Take 1 tablet (40 mg total) by mouth at bedtime. 90 tablet 3   Blood Glucose Monitoring Suppl (TRUE METRIX GO GLUCOSE METER) w/Device KIT Use as instructed to check blood sugar 2 times daily 1 kit 0   bumetanide (BUMEX) 1 MG tablet TAKE 2 TABLETS AT BEDTIME 180 tablet 3   cholecalciferol (VITAMIN D) 1000 UNITS tablet Take 2,000 Units by mouth at bedtime.     EPIPEN 2-PAK 0.3 MG/0.3ML SOAJ injection INJECT 0.3 MLS (0.3 MG TOTAL) INTO THE MUSCLE ONCE. 1 Device 0   Fexofenadine HCl (ALLEGRA PO) Take 1 tablet by mouth daily at 12 noon.     glimepiride (AMARYL) 1 MG tablet Take 2 tablets (2 mg total) by mouth daily with breakfast. 180 tablet 3   glucose blood (TRUE METRIX BLOOD GLUCOSE TEST) test strip USE AS INSTRUCTED TO CHECK BLOOD SUGAR TWICE DAILY E11.65 200 strip 3   hydrALAZINE (APRESOLINE) 100 MG tablet TAKE 1 TABLET THREE TIMES A DAY 270 tablet 3   Iron, Ferrous Sulfate, 325 (65 Fe) MG TABS Take 1 tablet by mouth daily.     isosorbide mononitrate (IMDUR) 120 MG 24 hr tablet TAKE 1 TABLET DAILY 90 tablet 3   JANUVIA 100 MG tablet TAKE 1 TABLET DAILY 90 tablet 3   ketoconazole (NIZORAL) 2 % cream APPLY TO AFFECTED AREA DAILY 30 g 5   metFORMIN (GLUCOPHAGE-XR) 500 MG 24 hr tablet TAKE 2 TABLETS TWICE A DAY WITH MEALS 360 tablet 3   metoprolol succinate (TOPROL-XL) 100 MG 24 hr tablet TAKE 1 TABLET BY MOUTH DAILY. TAKE WITH OR IMMEDIATELY FOLLOWING A MEAL. 90 tablet 3  mupirocin ointment (BACTROBAN) 2 % Place 1 application into the nose 2 (two) times daily. 22 g 1   Na Sulfate-K Sulfate-Mg Sulf 17.5-3.13-1.6 GM/177ML SOLN Take by mouth.     polyvinyl alcohol (LIQUIFILM TEARS) 1.4 % ophthalmic solution Place 1 drop into both eyes daily as needed for dry eyes.     sacubitril-valsartan (ENTRESTO) 97-103 MG Take 1 tablet by mouth 2 (two) times daily. 10 tablet 0   TRUEplus Lancets 33G MISC USE AS INSTRUCTED TO CHECK BLOOD SUGAR 2 TIMES  DAILY 200 each 3   No current facility-administered medications on file prior to visit.   Allergies  Allergen Reactions   Bee Venom Anaphylaxis   Family History  Problem Relation Age of Onset   Coronary artery disease Mother    Colon cancer Mother    Coronary artery disease Brother    Cancer Other        fhx   Diabetes Other        fhx   Hyperlipidemia Other        fhx   Hypertension Other        fhx   Stroke Other        fhx   Colon polyps Neg Hx    Esophageal cancer Neg Hx    Rectal cancer Neg Hx    Stomach cancer Neg Hx    PE: BP (!) 146/80   Pulse 71   Ht 5\' 3"  (1.6 m)   Wt 237 lb 9.6 oz (107.8 kg)   SpO2 98%   BMI 42.09 kg/m    Wt Readings from Last 3 Encounters:  05/25/23 237 lb 9.6 oz (107.8 kg)  04/04/23 239 lb (108.4 kg)  03/09/23 249 lb (112.9 kg)   Constitutional: overweight, in NAD Eyes:EOMI, no exophthalmos ENT: no thyromegaly, no cervical lymphadenopathy Cardiovascular: RRR, No MRG, + B LE edema. Respiratory: CTA B Musculoskeletal: no deformities Skin: no rashes Neurological: no tremor with outstretched hands  ASSESSMENT: 1. DM2, non-insulin-dependent, uncontrolled, with complications - CKD stage 2 - background DR  2.  Obesity class III  3. HL  PLAN:  1.  Patient with longstanding, uncontrolled, type 2 diabetes, on metformin and DPP 4 inhibitor, to which we added a sulfonylurea in 2022, which sugars increased to 300s.  At last visit, sugars were at goal mostly, except for the times when she had a larger dinner or forgot to take glimepiride before the meal.  We did not change the regimen.  Her HbA1c was excellent, at 6.3%, lower, but she had another HbA1c obtained 2 months ago and this was higher, at 6.9%. -At today's visit, sugars are mostly at goal when she checks in the morning but I also advised her to rotate her blood checks later in the day.  She has 1 blood sugar checked at bedtime and this was 100, excellent.  She does mention that  HbA1c was slightly higher 2 months ago due to the fact that she had stopped working and she was less active, but she plans to restart.  I did not recommend a change in her regimen for now. - I advised her to:  Patient Instructions  Please continue: - Metformin ER 1000 mg 2x a day with meals  - Januvia 100 mg before breakfast - Amaryl 0.5-1 mg 15 min before a larger meal  Please return in 4 months with your sugar log.   - advised to check sugars at different times of the day - 1x a day, rotating  check times - advised for yearly eye exams >> she is UTD and has an appointment coming up follow-up - return to clinic in 4 months  2.  Obesity class III -She can is on metformin and Januvia which are both weight neutral.  She is also on low-dose Amaryl which can be weight inducing. -She lost 3 pounds before last visit, previously gained 12 -Since last visit, she lost 8 pounds  3. HL -Reviewed the latest lipid panel from 2 months ago: LDL above our target of less than 70, otherwise fractions at goal: Lab Results  Component Value Date   CHOL 150 04/04/2023   HDL 48.40 04/04/2023   LDLCALC 84 04/04/2023   TRIG 87.0 04/04/2023   CHOLHDL 3 04/04/2023  -She is on Lipitor 40 mg daily without side effects  Carlus Pavlov, MD PhD [facility]

## 2023-06-19 ENCOUNTER — Telehealth: Payer: Self-pay | Admitting: Family Medicine

## 2023-06-19 NOTE — Telephone Encounter (Signed)
Patient called requesting an emergency 7 day supply  JANUVIA 100 MG tablet   [id number] - [place], [place] - [address] AT [facility] [address] Phone: [phone]  Fax: [phone]     Please advise ASAP [phone]

## 2023-06-20 ENCOUNTER — Other Ambulatory Visit: Payer: Self-pay

## 2023-06-20 DIAGNOSIS — E1165 Type 2 diabetes mellitus with hyperglycemia: Secondary | ICD-10-CM

## 2023-06-20 MED ORDER — SITAGLIPTIN PHOSPHATE 100 MG PO TABS
100.0000 mg | ORAL_TABLET | Freq: Every day | ORAL | 0 refills | Status: DC
Start: 2023-06-20 — End: 2024-01-29

## 2023-06-20 NOTE — Telephone Encounter (Signed)
Spoke with pt, stated the supply was no longer needed.

## 2023-07-06 ENCOUNTER — Telehealth: Payer: Self-pay | Admitting: Family Medicine

## 2023-07-06 NOTE — Telephone Encounter (Signed)
PT is calling and would like to ask md should she get another covid vaccine

## 2023-07-07 NOTE — Telephone Encounter (Signed)
Yes she should get the  latest one

## 2023-07-07 NOTE — Telephone Encounter (Signed)
Left a message for the patient to return my call.  

## 2023-07-10 LAB — HM DIABETES EYE EXAM

## 2023-07-10 NOTE — Telephone Encounter (Signed)
Pt returned call

## 2023-07-11 ENCOUNTER — Ambulatory Visit (INDEPENDENT_AMBULATORY_CARE_PROVIDER_SITE_OTHER): Payer: Medicare Other

## 2023-07-11 ENCOUNTER — Telehealth: Payer: Self-pay | Admitting: Family Medicine

## 2023-07-11 DIAGNOSIS — Z23 Encounter for immunization: Secondary | ICD-10-CM | POA: Diagnosis not present

## 2023-07-11 NOTE — Telephone Encounter (Signed)
Patient is requesting for Harriett Sine to give her a call.

## 2023-07-11 NOTE — Telephone Encounter (Signed)
Pt came by the office and was notified of Dr Clent Ridges advise

## 2023-07-13 ENCOUNTER — Encounter: Payer: Self-pay | Admitting: Family Medicine

## 2023-07-13 NOTE — Telephone Encounter (Signed)
Spoke with pt advised Rx sent to pt pharmacy. Advised pt to call pulmonary for nebulizer machine order

## 2023-08-02 ENCOUNTER — Telehealth: Payer: Self-pay | Admitting: Family Medicine

## 2023-08-02 NOTE — Telephone Encounter (Signed)
Pt call and stated she want to know will dr. Clent Ridges take her other son on as a new pt and want Harriett Sine to gave her a call back to let her know.

## 2023-08-03 NOTE — Telephone Encounter (Signed)
Yes I can see him again. I saw him when he was younger

## 2023-08-04 NOTE — Telephone Encounter (Signed)
Left a detailed message on pt voicemail, advised of Dr Clent Ridges message

## 2023-08-10 NOTE — Telephone Encounter (Signed)
Spoke with pt advised that Dr Clent Ridges has agreed to see her son

## 2023-09-17 ENCOUNTER — Other Ambulatory Visit: Payer: Self-pay | Admitting: Internal Medicine

## 2023-09-18 ENCOUNTER — Ambulatory Visit: Payer: Medicare Other | Admitting: Family Medicine

## 2023-09-18 ENCOUNTER — Encounter: Payer: Self-pay | Admitting: Family Medicine

## 2023-09-18 VITALS — BP 140/78 | HR 68 | Temp 98.3°F | Wt 249.0 lb

## 2023-09-18 DIAGNOSIS — L309 Dermatitis, unspecified: Secondary | ICD-10-CM | POA: Insufficient documentation

## 2023-09-18 DIAGNOSIS — L03115 Cellulitis of right lower limb: Secondary | ICD-10-CM

## 2023-09-18 MED ORDER — CEPHALEXIN 500 MG PO CAPS
500.0000 mg | ORAL_CAPSULE | Freq: Four times a day (QID) | ORAL | 0 refills | Status: AC
Start: 2023-09-18 — End: 2023-09-28

## 2023-09-18 MED ORDER — TRIAMCINOLONE ACETONIDE 0.1 % EX CREA: 1.0000 | TOPICAL_CREAM | Freq: Two times a day (BID) | CUTANEOUS | 2 refills | Status: DC | PRN

## 2023-09-18 NOTE — Progress Notes (Signed)
   Subjective:    Patient ID: Krista Benjamin, female    DOB: August 24, 1951, 72 y.o.   MRN: [id number]  HPI Here for irritated areas of skin on both lower legs. These areas began itching about 2 weeks ago, and she has been scratching them. She thinks she broke the skin in the right side, and now for the past 3 days the area is red and tender. No fever.    Review of Systems  Constitutional: Negative.   Respiratory: Negative.    Cardiovascular: Negative.   Skin:  Positive for rash.       Objective:   Physical Exam Constitutional:      Appearance: Normal appearance.  Cardiovascular:     Rate and Rhythm: Normal rate and regular rhythm.     Pulses: Normal pulses.     Heart sounds: Normal heart sounds.  Pulmonary:     Effort: Pulmonary effort is normal.     Breath sounds: Normal breath sounds.  Skin:    Comments: Both anterior lower legs have areas of scaly dark skin. The right foreleg is also red, warm, and tender.   Neurological:     Mental Status: She is alert.           Assessment & Plan:  She has eczema on both lower legs, and we will treat this with Triamcinolone cream BID as needed. She also has a cellulitis in the right lower leg, and we will treat this with 10 days of Keflex. Recheck as needed. Gershon Crane, MD

## 2023-09-25 ENCOUNTER — Telehealth: Payer: Self-pay | Admitting: Cardiology

## 2023-09-25 MED ORDER — ISOSORBIDE MONONITRATE ER 120 MG PO TB24: 120.0000 mg | ORAL_TABLET | Freq: Every day | ORAL | 0 refills | Status: DC

## 2023-09-25 NOTE — Telephone Encounter (Signed)
*  STAT* If patient is at the pharmacy, call can be transferred to refill team.   1. Which medications need to be refilled? (please list name of each medication and dose if known) isosorbide mononitrate (IMDUR) 120 MG 24 hr tablet   4. Which pharmacy/location (including street and city if local pharmacy) is medication to be sent to? [id number] - [place], [place] - [address] AT [address]    5. Do they need a 30 day or 90 day supply? 7-9 day supply. [facility]'t [facility] in on time. She asked that nurse call her once its been sent.

## 2023-09-25 NOTE — Telephone Encounter (Signed)
[facility] in [facility] because [facility] already went through [facility]. Please advise on how she can get medication until [facility] delivers.   isosorbide mononitrate (IMDUR) 120 MG 24 hr tablet

## 2023-09-25 NOTE — Telephone Encounter (Signed)
Pt's medication was sent to pt's pharmacy as requested. Confirmation received.  °

## 2023-09-25 NOTE — Telephone Encounter (Signed)
Called and spoke to patient. Verified name and DOB. Patient calling because she was not able to pick up her Imdur prescription that was sent to the pharmacy today for a 15 day supply. She is waiting for her mail order prescription to arrive. A 15 day supply was sent local and they to her it was to early because insurance would not cover it. [facility] and they did inform me that prescription had been filled by the mail order company. Advised them patient would be paying for medication OOP. Medication ran on discount card and 15 day supply is $18.20. I called patient to inform her of the cost. Patient will pick up medication from pharmacy.

## 2023-09-26 ENCOUNTER — Ambulatory Visit (INDEPENDENT_AMBULATORY_CARE_PROVIDER_SITE_OTHER): Payer: Medicare Other | Admitting: Internal Medicine

## 2023-09-26 ENCOUNTER — Encounter: Payer: Self-pay | Admitting: Internal Medicine

## 2023-09-26 VITALS — BP 150/70 | HR 79 | Ht 63.0 in | Wt 250.0 lb

## 2023-09-26 DIAGNOSIS — E782 Mixed hyperlipidemia: Secondary | ICD-10-CM

## 2023-09-26 DIAGNOSIS — N1831 Chronic kidney disease, stage 3a: Secondary | ICD-10-CM

## 2023-09-26 DIAGNOSIS — E0822 Diabetes mellitus due to underlying condition with diabetic chronic kidney disease: Secondary | ICD-10-CM

## 2023-09-26 DIAGNOSIS — Z7984 Long term (current) use of oral hypoglycemic drugs: Secondary | ICD-10-CM | POA: Diagnosis not present

## 2023-09-26 DIAGNOSIS — E1122 Type 2 diabetes mellitus with diabetic chronic kidney disease: Secondary | ICD-10-CM

## 2023-09-26 LAB — POCT GLYCOSYLATED HEMOGLOBIN (HGB A1C): Hemoglobin A1C: 6.6 % — AB (ref 4.0–5.6)

## 2023-09-26 NOTE — Progress Notes (Signed)
Patient ID: Krista Benjamin, female   DOB: 06-Aug-1951, 72 y.o.   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  Last visit 6 months ago. She has [employer] + [employer].   Interim history: No increased urination, blurry vision, nausea, chest pain.   She was working from 7-7:45 and from 2-2:45 pm.  She stopped working before last visit but restarted since then. Since last visit, she had cellulitis in the right leg.  She was seen by PCP earlier this month and was prescribed 10 days of Keflex and a steroid cream.  She mentions she has eczema - legs.  She feels that this is improving. She had sciatica -now improved.  Reviewed HbA1c levels: Lab Results  Component Value Date   HGBA1C 6.9 (H) 04/04/2023   HGBA1C 6.3 (A) 12/27/2022   HGBA1C 6.7 (A) 08/25/2022  Prev. 5.9% on 12/2012, previously 6.1%.  Pt is on a regimen of: - Metformin ER 1000 mg 2x a day with meals  - Tradjenta 5 >> Januvia 100 mg before breakfast - per insurance pref. - Amaryl 2 mg before dinner >> at bedtime >> 0.5-1 mg before meals Other medications tried: Rec'd GLP1 agonist >> Victoza covered >> she was afraid of SEs. We tried Jardiance 10 mg daily in am - added 04/2017 >> yeast inf >> stopped We tried Invokana 100 mg in am >> stopped after 3 days >> nausea, diarrhea We stopped Lantus 10 units qhs. We stopped Actos 45 mg (was on it since 07/2001) Metformin IR 1000 mg bid >> stopped b/c Nausea/Diarrhea She was on Novolog 8 units tid ac, when sugars >140  We stopped Amaryl 4 mg before breakfast in 11/2020 but had to restart 2/2 high CBGs.Marland Kitchen  Pt checks her sugars 1 times a day: - am: 63, 80-123, 123 >> 86-135 >> 87-140 >> 80-137, 142 >> 119-134, 145 - 2h after b'fast: 189 >> 115 >> n/c >> 130 after coffee >> n/c - lunch:  1n/c >> 156 >> 79 >> n/c >> 67, 87-109 >> n/c -  2h after lunch: 303 >> n/c >> 201 >> 104 >> n/c - dinner: 171, 178, 195 (steroids) >> n/c >> 62 >> n/c - 2h after dinner: 197, 210, 231  >> 115-173 >> n/c >> 123 - bedtime:  134 >> 107, 211 >> n/c >> 127 >> n/c - night: 75 >> n/c >> 170 >> 145 >> 100 Lowest sugar:  53 >> ... 80 >> 119 ; she has hypoglycemia awareness in the 70s. Highest sugar: 364 >> .Marland KitchenMarland Kitchen 142 >> 145  Glucometer: One Touch Verio Flex  She lost 80 pounds since 2012 >> She was able to come off insulin then. She saw Oran Rein (nutritionist) on 01/01/2015.  -+ Mild CKD. Latest BUN/creatinine was:  Lab Results  Component Value Date   BUN 31 (H) 04/04/2023   Lab Results  Component Value Date   CREATININE 1.02 04/04/2023   Lab Results  Component Value Date   GFR 54.79 (L) 04/04/2023   GFR 46.80 (L) 04/01/2022   GFR 62.11 03/09/2021   GFR 68.89 04/25/2019   GFR 81.11 04/20/2018   GFR 75.32 11/28/2017   GFR 100.72 02/20/2017   GFR 69.70 06/02/2016   GFR 85.07 08/26/2015   GFR 111.88 09/22/2014   Lab Results  Component Value Date   MICRALBCREAT 3.4 11/20/2020   MICRALBCREAT 2.3 08/26/2015  MICRALBCREAT 5.5 09/22/2014   MICRALBCREAT 5.4 11/21/2007  On Entresto.  -+ HL.  Latest lipid profile: Lab Results  Component Value Date   CHOL 150 04/04/2023   HDL 48.40 04/04/2023   LDLCALC 84 04/04/2023   TRIG 87.0 04/04/2023   CHOLHDL 3 04/04/2023  On Lipitor 40.  - last eye exam: 06/09/2022: No DR; previous history of background DR; + h/o B cataract sx.  - no numbness and tingling in her legs.  Last foot exam 12/27/2022.  She also has a history of HTN, iron deficiency anemia-on iron therapy, aortic valve disease/murmur, left ventricular hypertrophy per EKG, obesity, multinodular goiter, fibroids.  She had shingles in 11/2014 and 02/2016.  She fell and fractured R wrist 06/2018. She had another, right radial, fracture 01/2019. She was in the ED on 10/05/2021 with hypertensive urgency (systolic BP in the  200s).  ROS: + See HPI  I reviewed pt's medications, allergies, PMH, social hx, family hx, and changes were documented in the history of present illness. Otherwise, unchanged from my initial visit note.  Past Medical History:  Diagnosis Date   Anemia    Aortic stenosis    Mild   Arthritis    Cataract    both eyes  (cataracts removed)   COPD (chronic obstructive pulmonary disease) (HCC)    Diabetes mellitus    sees Dr. Elvera Lennox    Gynecological examination    sees Dr. Jennette Kettle    Hyperlipidemia    Hypertension    sees Dr. Angelina Sheriff   Stroke [facility])    TIA   Past Surgical History:  Procedure Laterality Date   CARPAL TUNNEL RELEASE  01/22/2014   Left Hand    CATARACT EXTRACTION     Lens surgery   COLONOSCOPY  02/02/2023   per Dr. Myrtie Neither, no polyps, no repeats needed   excision of benign cyst     from left maxilla and left maxillary sinus   KNEE SURGERY     RIGHT/LEFT HEART CATH AND CORONARY ANGIOGRAPHY N/A 09/14/2018   Procedure: RIGHT/LEFT HEART CATH AND CORONARY ANGIOGRAPHY;  Surgeon: [place], Peter M, MD;  Location: [facility] INVASIVE CV LAB;  Service: Cardiovascular;  Laterality: N/A;   SHOULDER ARTHROSCOPY W/ ROTATOR CUFF REPAIR Left 09/23/2013   per Dr. Delrae Sawyers at [facility] center in [place]    Social History   Socioeconomic History   Marital status: Married    Spouse name: Not on file   Number of children: Not on file   Years of education: Not on file   Highest education level: Not on file  Occupational History   Not on file  Tobacco Use   Smoking status: Never   Smokeless tobacco: Never  Vaping Use   Vaping status: Never Used  Substance and Sexual Activity   Alcohol use: No    Alcohol/week: 0.0 standard drinks of alcohol   Drug use: No   Sexual activity: Not Currently    Birth control/protection: Post-menopausal  Other Topics Concern   Not on file  Social History Narrative   Not on file   Social Drivers of Health   Financial Resource  Strain: Low Risk  (05/02/2022)   Overall Financial Resource Strain (CARDIA)    Difficulty of Paying Living Expenses: Not hard at all  Food Insecurity: No Food Insecurity (05/02/2022)   Hunger Vital Sign    Worried About Running Out of Food in the Last Year: Never true    Ran Out of Food in the Last Year:  Never true  Transportation Needs: No Transportation Needs (05/02/2022)   PRAPARE - Administrator, Civil Service (Medical): No    Lack of Transportation (Non-Medical): No  Physical Activity: Inactive (05/02/2022)   Exercise Vital Sign    Days of Exercise per Week: 0 days    Minutes of Exercise per Session: 0 min  Stress: No Stress Concern Present (05/02/2022)   [employer] of Occupational Health - Occupational Stress Questionnaire    Feeling of Stress : Not at all  Social Connections: Socially Integrated (05/02/2022)   Social Connection and Isolation Panel [NHANES]    Frequency of Communication with Friends and Family: More than three times a week    Frequency of Social Gatherings with Friends and Family: More than three times a week    Attends Religious Services: More than 4 times per year    Active Member of [employer] or Organizations: Yes    Attends Engineer, structural: More than 4 times per year    Marital Status: Married  Catering manager Violence: Not At Risk (05/02/2022)   Humiliation, Afraid, Rape, and Kick questionnaire    Fear of Current or Ex-Partner: No    Emotionally Abused: No    Physically Abused: No    Sexually Abused: No   Current Outpatient Medications on File Prior to Visit  Medication Sig Dispense Refill   albuterol (PROVENTIL) (2.5 MG/3ML) 0.083% nebulizer solution Take 3 mLs (2.5 mg total) by nebulization every 6 (six) hours as needed for wheezing or shortness of breath. 75 mL 12   albuterol (VENTOLIN HFA) 108 (90 Base) MCG/ACT inhaler Inhale 1-2 puffs into the lungs every 4 (four) hours as needed for wheezing or shortness of breath. 1 each 11    Alcohol Swabs (ALCOHOL PREP) 70 % PADS USE TO CHECK BLOOD SUGAR ONCE DAILY 100 each 3   aspirin EC 81 MG tablet Take 81 mg by mouth at bedtime. Swallow whole.     atorvastatin (LIPITOR) 40 MG tablet Take 1 tablet (40 mg total) by mouth at bedtime. 90 tablet 3   Blood Glucose Monitoring Suppl (TRUE METRIX GO GLUCOSE METER) w/Device KIT Use as instructed to check blood sugar 2 times daily 1 kit 0   bumetanide (BUMEX) 1 MG tablet TAKE 2 TABLETS AT BEDTIME 180 tablet 3   cephALEXin (KEFLEX) 500 MG capsule Take 1 capsule (500 mg total) by mouth 4 (four) times daily for 10 days. 40 capsule 0   cholecalciferol (VITAMIN D) 1000 UNITS tablet Take 2,000 Units by mouth at bedtime.     EPIPEN 2-PAK 0.3 MG/0.3ML SOAJ injection INJECT 0.3 MLS (0.3 MG TOTAL) INTO THE MUSCLE ONCE. 1 Device 0   Fexofenadine HCl (ALLEGRA PO) Take 1 tablet by mouth daily at 12 noon.     glimepiride (AMARYL) 1 MG tablet Take 2 tablets (2 mg total) by mouth daily with breakfast. 180 tablet 3   glucose blood (TRUE METRIX BLOOD GLUCOSE TEST) test strip USE AS INSTRUCTED TO CHECK BLOOD SUGAR TWICE DAILY E11.65 200 strip 3   hydrALAZINE (APRESOLINE) 100 MG tablet TAKE 1 TABLET THREE TIMES A DAY 270 tablet 3   Iron, Ferrous Sulfate, 325 (65 Fe) MG TABS Take 1 tablet by mouth daily.     isosorbide mononitrate (IMDUR) 120 MG 24 hr tablet Take 1 tablet (120 mg total) by mouth daily. 15 tablet 0   ketoconazole (NIZORAL) 2 % cream APPLY TO AFFECTED AREA DAILY 30 g 5   metFORMIN (GLUCOPHAGE-XR) 500 MG  24 hr tablet TAKE 2 TABLETS TWICE A DAY WITH MEALS 360 tablet 3   metoprolol succinate (TOPROL-XL) 100 MG 24 hr tablet TAKE 1 TABLET BY MOUTH DAILY. TAKE WITH OR IMMEDIATELY FOLLOWING A MEAL. 90 tablet 3   mupirocin ointment (BACTROBAN) 2 % Place 1 application into the nose 2 (two) times daily. 22 g 1   Na Sulfate-K Sulfate-Mg Sulf 17.5-3.13-1.6 GM/177ML SOLN Take by mouth.     polyvinyl alcohol (LIQUIFILM TEARS) 1.4 % ophthalmic solution Place  1 drop into both eyes daily as needed for dry eyes.     sacubitril-valsartan (ENTRESTO) 97-103 MG Take 1 tablet by mouth 2 (two) times daily. 10 tablet 0   sitaGLIPtin (JANUVIA) 100 MG tablet Take 1 tablet (100 mg total) by mouth daily. 7 tablet 0   triamcinolone cream (KENALOG) 0.1 % Apply 1 Application topically 2 (two) times daily as needed (eczema). 45 g 2   TRUEplus Lancets 33G MISC USE AS INSTRUCTED TO CHECK BLOOD SUGAR 2 TIMES DAILY 200 each 3   No current facility-administered medications on file prior to visit.   Allergies  Allergen Reactions   Bee Venom Anaphylaxis   Family History  Problem Relation Age of Onset   Coronary artery disease Mother    Colon cancer Mother    Coronary artery disease Brother    Cancer Other        fhx   Diabetes Other        fhx   Hyperlipidemia Other        fhx   Hypertension Other        fhx   Stroke Other        fhx   Colon polyps Neg Hx    Esophageal cancer Neg Hx    Rectal cancer Neg Hx    Stomach cancer Neg Hx    PE: BP (!) 150/70 Comment: No Meds taken this morning  Pulse 79   Ht 5\' 3"  (1.6 m)   Wt 250 lb (113.4 kg)   SpO2 96%   BMI 44.29 kg/m    Wt Readings from Last 3 Encounters:  09/26/23 250 lb (113.4 kg)  09/18/23 249 lb (112.9 kg)  05/25/23 237 lb 9.6 oz (107.8 kg)   Constitutional: overweight, in NAD Eyes:EOMI, no exophthalmos ENT: no thyromegaly, no cervical lymphadenopathy Cardiovascular: RRR, No MRG, + B LE edema. Respiratory: CTA B Musculoskeletal: no deformities Skin: rash B legs Neurological: no tremor with outstretched hands  ASSESSMENT: 1. DM2, non-insulin-dependent, uncontrolled, with complications - CKD stage 2 - background DR  2.  Obesity class III  3. HL  PLAN:  1.  Patient with longstanding, uncontrolled, type 2 diabetes, on metformin and DPP 4 inhibitor along with sulfonylurea, added in 2022, when sugars increased to 300s.  On this regimen, sugars were mostly at goal at last visit when  she was checking in the morning but I did advise her to rotate the blood sugar checks to include values later in the day.  HbA1c was at goal, at 6.9%.  She was less active but was planning to restart working.  We did not change her regimen at that time. -At today's visit, sugars are mostly at goal in the morning, with occasional mild hyperglycemic excursions and she is not taking consistently during the day.  I again advised her to try to check sugars beyond fasting in the morning.  However, based on the HbA1c, which has improved since last visit, for now, we will continue the current  regimen. -I advised her to: Patient Instructions  Please continue: - Metformin ER 1000 mg 2x a day with meals  - Januvia 100 mg before breakfast - Amaryl 0.5-1 mg 15 min before a larger meal  Please return in 4 months with your sugar log.   - we checked her HbA1c: 6.6% (lower) - advised to check sugars at different times of the day - 1x a day, rotating check times - advised for yearly eye exams >> she is UTD - return to clinic in 4 months  2.  Obesity class III -She continues metformin and Januvia which are both weight neutral.  She is also on low-dose Amaryl which can be weight inducing. -Last visit, she lost 8 pounds -She gained 13 pounds since then  3. HL -Latest labs reviewed from 03/2023: LDL above our target of less than 70, otherwise fractions at goal: Lab Results  Component Value Date   CHOL 150 04/04/2023   HDL 48.40 04/04/2023   LDLCALC 84 04/04/2023   TRIG 87.0 04/04/2023   CHOLHDL 3 04/04/2023  -Will continue Lipitor 40 mg daily-no side effects  Carlus Pavlov, MD PhD [facility]

## 2023-09-26 NOTE — Patient Instructions (Signed)
Please continue: - Metformin ER 1000 mg 2x a day with meals  - Januvia 100 mg before breakfast - Amaryl 0.5-1 mg 15 min before a larger meal  Please return in 4 months with your sugar log.

## 2023-09-27 LAB — MICROALBUMIN / CREATININE URINE RATIO
Creatinine, Urine: 76 mg/dL (ref 20–275)
Microalb Creat Ratio: 47 mg/g{creat} — ABNORMAL HIGH (ref <30)
Microalb, Ur: 3.6 mg/dL

## 2023-09-28 NOTE — Addendum Note (Signed)
Addended by: Pollie Meyer on: 09/28/2023 11:19 AM   Modules accepted: Orders

## 2023-10-02 ENCOUNTER — Other Ambulatory Visit: Payer: Self-pay | Admitting: Internal Medicine

## 2023-10-02 ENCOUNTER — Telehealth: Payer: Self-pay

## 2023-10-02 DIAGNOSIS — E1165 Type 2 diabetes mellitus with hyperglycemia: Secondary | ICD-10-CM

## 2023-10-02 NOTE — Telephone Encounter (Signed)
Copied from CRM [id number]. Topic: Clinical - Prescription Issue >> Oct 02, 2023  7:58 AM Adele Barthel wrote: Reason for CRM: Pt is trying to refill triamcinolone cream (KENALOG) 0.1 % at her pharmacy on file, she was advised it is too soon to refill the medication. She is almost finished with her last tube. She is requesting a call back, CB#[phone]

## 2023-10-05 MED ORDER — TRIAMCINOLONE ACETONIDE 0.1 % EX CREA: 1.0000 | TOPICAL_CREAM | Freq: Two times a day (BID) | CUTANEOUS | 2 refills | Status: DC | PRN

## 2023-10-05 NOTE — Telephone Encounter (Signed)
I sent in a new RX for a larger jar of the cream

## 2023-10-05 NOTE — Addendum Note (Signed)
Addended by: Gershon Crane A on: 10/05/2023 12:49 PM   Modules accepted: Orders

## 2023-10-19 ENCOUNTER — Other Ambulatory Visit: Payer: Self-pay | Admitting: Family Medicine

## 2023-10-19 ENCOUNTER — Telehealth: Payer: Self-pay | Admitting: *Deleted

## 2023-10-19 MED ORDER — HYDROCODONE BIT-HOMATROP MBR 5-1.5 MG/5ML PO SOLN: 5.0000 mL | ORAL | 0 refills | Status: DC | PRN

## 2023-10-19 NOTE — Telephone Encounter (Signed)
 Done

## 2023-10-19 NOTE — Telephone Encounter (Signed)
 Copied from CRM [id number]. Topic: Clinical - Medication Question >> Oct 19, 2023  8:30 AM [name] wrote: Reason for CRM: Patient has had cough since yesterday. Has tried albuterol  machine, but not has not been helpful. Requesting Dr. Johnny refill a previously prescribed HYDROcodone  bit-homatropine (HYCODAN) 5-1.5 MG/5ML syrup to help with her cough. She has not tried any other medications. Requests CB if there is an issue. # [phone] [id number]

## 2023-10-19 NOTE — Telephone Encounter (Signed)
 Copied from CRM [id number]. Topic: Clinical - Medication Refill >> Oct 19, 2023  8:23 AM [name] wrote: Most Recent Primary Care Visit:  Provider: JOHNNY SENIOR A  Department: [facility]  Visit Type: OFFICE VISIT  Date: 09/18/2023  Medication: HYDROcodone  bit-homatropine (HYCODAN) 5-1.5 MG/5ML syrup  Has the patient contacted their pharmacy? No (Agent: If no, request that the patient contact the pharmacy for the refill. If patient does not wish to contact the pharmacy document the reason why and proceed with request.) (Agent: If yes, when and what did the pharmacy advise?)  Is this the correct pharmacy for this prescription? Yes If no, delete pharmacy and type the correct one.  This is the patient's preferred pharmacy:   [id number] - [place], [place] - [address] AT [facility] [address] [place] [address] Phone: [phone] Fax: [phone]   Has the prescription been filled recently? No  Is the patient out of the medication? Yes  Has the patient been seen for an appointment in the last year OR does the patient have an upcoming appointment? Yes  Can we respond through MyChart? Yes  Agent: Please be advised that Rx refills may take up to 3 business days. We ask that you follow-up with your pharmacy.

## 2023-10-20 NOTE — Telephone Encounter (Signed)
 FYI Pt is scheduled with Dr. Ardyth Harps on 10/23/2023 for cough sx.

## 2023-10-23 ENCOUNTER — Encounter: Payer: Self-pay | Admitting: Internal Medicine

## 2023-10-23 ENCOUNTER — Ambulatory Visit: Payer: Medicare Other | Admitting: Family Medicine

## 2023-10-23 ENCOUNTER — Ambulatory Visit: Payer: Medicare Other | Admitting: Internal Medicine

## 2023-10-23 VITALS — BP 130/80 | HR 70 | Temp 98.5°F | Wt 248.6 lb

## 2023-10-23 DIAGNOSIS — J069 Acute upper respiratory infection, unspecified: Secondary | ICD-10-CM | POA: Diagnosis not present

## 2023-10-23 NOTE — Telephone Encounter (Signed)
 Pt was seen this morning by Dr Ardyth Harps for this issue

## 2023-10-23 NOTE — Progress Notes (Signed)
 Established Patient Office Visit     CC/Reason for Visit: Cough, rhinorrhea, postnasal drip, nasal congestion  HPI: Krista Benjamin is a 73 y.o. female who is coming in today for the above mentioned reasons. Past Medical History is significant for: For 4 days has been having above symptoms.  Multiple sick contacts.  She has been having a significant cough.  Her PCP sent in some cough syrup which she has been using without much relief.   Past Medical/Surgical History: Past Medical History:  Diagnosis Date   Anemia    Aortic stenosis    Mild   Arthritis    Cataract    both eyes  (cataracts removed)   COPD (chronic obstructive pulmonary disease) (HCC)    Diabetes mellitus    sees Dr. Trixie    Gynecological examination    sees Dr. Rosalynn    Hyperlipidemia    Hypertension    sees Dr. Ethan Schilling   Stroke [facility])    TIA    Past Surgical History:  Procedure Laterality Date   CARPAL TUNNEL RELEASE  01/22/2014   Left Hand    CATARACT EXTRACTION     Lens surgery   COLONOSCOPY  02/02/2023   per Dr. Legrand, no polyps, no repeats needed   excision of benign cyst     from left maxilla and left maxillary sinus   KNEE SURGERY     RIGHT/LEFT HEART CATH AND CORONARY ANGIOGRAPHY N/A 09/14/2018   Procedure: RIGHT/LEFT HEART CATH AND CORONARY ANGIOGRAPHY;  Surgeon: Jordan, Peter M, MD;  Location: [facility] INVASIVE CV LAB;  Service: Cardiovascular;  Laterality: N/A;   SHOULDER ARTHROSCOPY W/ ROTATOR CUFF REPAIR Left 09/23/2013   per Dr. Lorenso Hint at [facility] center in [place]     Social History:  reports that she has never smoked. She has never used smokeless tobacco. She reports that she does not drink alcohol  and does not use drugs.  Allergies: Allergies  Allergen Reactions   Bee Venom Anaphylaxis    Family History:  Family History  Problem Relation Age of Onset   Coronary artery disease Mother    Colon cancer Mother    Coronary artery disease Brother     Cancer Other        fhx   Diabetes Other        fhx   Hyperlipidemia Other        fhx   Hypertension Other        fhx   Stroke Other        fhx   Colon polyps Neg Hx    Esophageal cancer Neg Hx    Rectal cancer Neg Hx    Stomach cancer Neg Hx      Current Outpatient Medications:    albuterol  (PROVENTIL ) (2.5 MG/3ML) 0.083% nebulizer solution, Take 3 mLs (2.5 mg total) by nebulization every 6 (six) hours as needed for wheezing or shortness of breath., Disp: 75 mL, Rfl: 12   albuterol  (VENTOLIN  HFA) 108 (90 Base) MCG/ACT inhaler, Inhale 1-2 puffs into the lungs every 4 (four) hours as needed for wheezing or shortness of breath., Disp: 1 each, Rfl: 11   Alcohol  Swabs  (ALCOHOL  PREP) 70 % PADS, USE TO CHECK BLOOD SUGAR ONCE DAILY, Disp: 100 each, Rfl: 3   aspirin  EC 81 MG tablet, Take 81 mg by mouth at bedtime. Swallow whole., Disp: , Rfl:    atorvastatin  (LIPITOR) 40 MG tablet, Take 1 tablet (40 mg total) by mouth at bedtime.,  Disp: 90 tablet, Rfl: 3   Blood Glucose Monitoring Suppl (TRUE METRIX GO GLUCOSE METER) w/Device KIT, Use as instructed to check blood sugar 2 times daily, Disp: 1 kit, Rfl: 0   bumetanide  (BUMEX ) 1 MG tablet, TAKE 2 TABLETS AT BEDTIME, Disp: 180 tablet, Rfl: 3   cholecalciferol (VITAMIN D) 1000 UNITS tablet, Take 2,000 Units by mouth at bedtime., Disp: , Rfl:    EPIPEN  2-PAK 0.3 MG/0.3ML SOAJ injection, INJECT 0.3 MLS (0.3 MG TOTAL) INTO THE MUSCLE ONCE., Disp: 1 Device, Rfl: 0   Fexofenadine HCl (ALLEGRA PO), Take 1 tablet by mouth daily at 12 noon., Disp: , Rfl:    glimepiride  (AMARYL ) 1 MG tablet, Take 2 tablets (2 mg total) by mouth daily with breakfast., Disp: 180 tablet, Rfl: 3   glucose blood (TRUE METRIX BLOOD GLUCOSE TEST) test strip, USE AS INSTRUCTED TO CHECK BLOOD SUGAR TWICE DAILY E11.65, Disp: 200 strip, Rfl: 3   hydrALAZINE  (APRESOLINE ) 100 MG tablet, TAKE 1 TABLET THREE TIMES A DAY, Disp: 270 tablet, Rfl: 3   HYDROcodone  bit-homatropine (HYCODAN)  5-1.5 MG/5ML syrup, Take 5 mLs by mouth every 4 (four) hours as needed., Disp: 240 mL, Rfl: 0   Iron , Ferrous Sulfate , 325 (65 Fe) MG TABS, Take 1 tablet by mouth daily., Disp: , Rfl:    isosorbide  mononitrate (IMDUR ) 120 MG 24 hr tablet, Take 1 tablet (120 mg total) by mouth daily., Disp: 15 tablet, Rfl: 0   ketoconazole  (NIZORAL ) 2 % cream, APPLY TO AFFECTED AREA DAILY, Disp: 30 g, Rfl: 5   metFORMIN  (GLUCOPHAGE -XR) 500 MG 24 hr tablet, TAKE 2 TABLETS TWICE A DAY WITH MEALS, Disp: 360 tablet, Rfl: 3   metoprolol  succinate (TOPROL -XL) 100 MG 24 hr tablet, TAKE 1 TABLET BY MOUTH DAILY. TAKE WITH OR IMMEDIATELY FOLLOWING A MEAL., Disp: 90 tablet, Rfl: 3   mupirocin  ointment (BACTROBAN ) 2 %, Place 1 application into the nose 2 (two) times daily., Disp: 22 g, Rfl: 1   Na Sulfate-K Sulfate-Mg Sulf 17.5-3.13-1.6 GM/177ML SOLN, Take by mouth., Disp: , Rfl:    polyvinyl alcohol  (LIQUIFILM TEARS) 1.4 % ophthalmic solution, Place 1 drop into both eyes daily as needed for dry eyes., Disp: , Rfl:    sacubitril -valsartan  (ENTRESTO ) 97-103 MG, Take 1 tablet by mouth 2 (two) times daily., Disp: 10 tablet, Rfl: 0   sitaGLIPtin  (JANUVIA ) 100 MG tablet, Take 1 tablet (100 mg total) by mouth daily., Disp: 7 tablet, Rfl: 0   triamcinolone  cream (KENALOG ) 0.1 %, Apply 1 Application topically 2 (two) times daily as needed (eczema)., Disp: 453.6 g, Rfl: 2   TRUEplus Lancets 33G MISC, USE AS INSTRUCTED TO CHECK BLOOD SUGAR 2 TIMES DAILY, Disp: 200 each, Rfl: 3  Review of Systems:  Negative unless indicated in HPI.   Physical Exam: Vitals:   10/23/23 0957  BP: 130/80  Pulse: 70  Temp: 98.5 F (36.9 C)  TempSrc: Oral  SpO2: 98%  Weight: 248 lb 9.6 oz (112.8 kg)    Body mass index is 44.04 kg/m.   Physical Exam Vitals reviewed.  Constitutional:      Appearance: Normal appearance.  HENT:     Right Ear: Tympanic membrane, ear canal and external ear normal.     Left Ear: Tympanic membrane, ear canal and  external ear normal.     Mouth/Throat:     Mouth: Mucous membranes are moist.     Pharynx: Posterior oropharyngeal erythema present.  Eyes:     Conjunctiva/sclera: Conjunctivae normal.  Pupils: Pupils are equal, round, and reactive to light.  Cardiovascular:     Rate and Rhythm: Normal rate and regular rhythm.  Pulmonary:     Effort: Pulmonary effort is normal.     Breath sounds: Normal breath sounds.  Neurological:     Mental Status: She is alert.      Impression and Plan:  Viral URI with cough   -Given exam findings, PNA, pharyngitis, ear infection are not likely, hence abx have not been prescribed. -Have advised rest, fluids, OTC antihistamines, cough suppressants and mucinex. -RTC if no improvement in 10-14 days.   Time spent:22 minutes reviewing chart, interviewing and examining patient and formulating plan of care.     Tully Theophilus Andrews, MD [facility]

## 2023-10-26 ENCOUNTER — Telehealth: Payer: Self-pay | Admitting: Family Medicine

## 2023-10-27 NOTE — Telephone Encounter (Signed)
No message available

## 2023-11-01 ENCOUNTER — Ambulatory Visit: Payer: Medicare Other | Admitting: Family Medicine

## 2023-11-01 ENCOUNTER — Encounter: Payer: Self-pay | Admitting: Family Medicine

## 2023-11-01 VITALS — BP 126/76 | HR 63 | Temp 98.6°F | Wt 246.0 lb

## 2023-11-01 DIAGNOSIS — J069 Acute upper respiratory infection, unspecified: Secondary | ICD-10-CM | POA: Diagnosis not present

## 2023-11-01 NOTE — Progress Notes (Signed)
   Subjective:    Patient ID: Cyrina Prasek, female    DOB: [birth year], 73 y.o.   MRN: [id number]  HPI Here with her husband for 2 weeks of chest congestion and a dry cough. No fever or SOB. Her husband has had similar symptoms, and on 10-24-23 he tested positive for Covid. Albertha has been using Hycodan syrup at night and she has been able to sleep well. She is here to make she is "alright".    Review of Systems  Constitutional: Negative.   HENT: Negative.    Eyes: Negative.   Respiratory:  Positive for cough. Negative for shortness of breath and wheezing.        Objective:   Physical Exam Constitutional:      Appearance: Normal appearance.  Cardiovascular:     Rate and Rhythm: Normal rate and regular rhythm.     Pulses: Normal pulses.     Heart sounds: Normal heart sounds.  Pulmonary:     Effort: Pulmonary effort is normal.     Breath sounds: Normal breath sounds.  Neurological:     Mental Status: She is alert.           Assessment & Plan:  She is getting over a viral URI, most likely due to Covid. She will return as needed. Gershon Crane, MD

## 2023-11-03 ENCOUNTER — Ambulatory Visit: Payer: Medicare Other | Admitting: Family Medicine

## 2024-01-05 ENCOUNTER — Telehealth: Payer: Self-pay | Admitting: Cardiology

## 2024-01-05 MED ORDER — ENTRESTO 97-103 MG PO TABS: 1.0000 | ORAL_TABLET | Freq: Two times a day (BID) | ORAL | 0 refills | Status: DC

## 2024-01-05 NOTE — Telephone Encounter (Signed)
 Pt's medication was sent to pt's pharmacy as requested. Confirmation received.

## 2024-01-05 NOTE — Telephone Encounter (Signed)
 *  STAT* If patient is at the pharmacy, call can be transferred to refill team.   1. Which medications need to be refilled? (please list name of each medication and dose if known)   sacubitril-valsartan (ENTRESTO) 97-103 MG     2. Would you like to learn more about the convenience, safety, & potential cost savings by using the [facility]? No      3. Are you open to using the [facility] (Type [facility]. ). No    4. Which pharmacy/location (including street and city if local pharmacy) is medication to be sent to? [facility]   5. Do they need a 30 day or 90 day supply? 90 days

## 2024-01-11 ENCOUNTER — Encounter (INDEPENDENT_AMBULATORY_CARE_PROVIDER_SITE_OTHER): Payer: Medicare Other | Admitting: Family Medicine

## 2024-01-11 NOTE — Progress Notes (Signed)
error 

## 2024-01-29 ENCOUNTER — Emergency Department (HOSPITAL_BASED_OUTPATIENT_CLINIC_OR_DEPARTMENT_OTHER): Payer: Worker's Compensation

## 2024-01-29 ENCOUNTER — Encounter (HOSPITAL_BASED_OUTPATIENT_CLINIC_OR_DEPARTMENT_OTHER): Payer: Self-pay | Admitting: Emergency Medicine

## 2024-01-29 ENCOUNTER — Emergency Department (HOSPITAL_BASED_OUTPATIENT_CLINIC_OR_DEPARTMENT_OTHER)
Admission: EM | Admit: 2024-01-29 | Discharge: 2024-02-03 | Disposition: A | Discharge status: Home / Self Care | Payer: Worker's Compensation | Attending: Emergency Medicine | Admitting: Emergency Medicine

## 2024-01-29 ENCOUNTER — Ambulatory Visit: Payer: Medicare Other | Admitting: Internal Medicine

## 2024-01-29 ENCOUNTER — Encounter: Payer: Self-pay | Admitting: Internal Medicine

## 2024-01-29 ENCOUNTER — Other Ambulatory Visit: Payer: Self-pay

## 2024-01-29 ENCOUNTER — Ambulatory Visit: Payer: Self-pay

## 2024-01-29 VITALS — BP 136/70 | HR 72 | Ht 63.0 in | Wt 246.6 lb

## 2024-01-29 DIAGNOSIS — Z7982 Long term (current) use of aspirin: Secondary | ICD-10-CM | POA: Diagnosis not present

## 2024-01-29 DIAGNOSIS — Z7984 Long term (current) use of oral hypoglycemic drugs: Secondary | ICD-10-CM

## 2024-01-29 DIAGNOSIS — E1122 Type 2 diabetes mellitus with diabetic chronic kidney disease: Secondary | ICD-10-CM

## 2024-01-29 DIAGNOSIS — R519 Headache, unspecified: Secondary | ICD-10-CM | POA: Diagnosis not present

## 2024-01-29 DIAGNOSIS — E1165 Type 2 diabetes mellitus with hyperglycemia: Secondary | ICD-10-CM

## 2024-01-29 DIAGNOSIS — S63392A Traumatic rupture of other ligament of left wrist, initial encounter: Secondary | ICD-10-CM | POA: Diagnosis not present

## 2024-01-29 DIAGNOSIS — E782 Mixed hyperlipidemia: Secondary | ICD-10-CM | POA: Diagnosis not present

## 2024-01-29 DIAGNOSIS — W01198A Fall on same level from slipping, tripping and stumbling with subsequent striking against other object, initial encounter: Secondary | ICD-10-CM | POA: Diagnosis not present

## 2024-01-29 DIAGNOSIS — M79645 Pain in left finger(s): Secondary | ICD-10-CM | POA: Diagnosis not present

## 2024-01-29 DIAGNOSIS — N182 Chronic kidney disease, stage 2 (mild): Secondary | ICD-10-CM

## 2024-01-29 DIAGNOSIS — M25562 Pain in left knee: Secondary | ICD-10-CM | POA: Diagnosis not present

## 2024-01-29 DIAGNOSIS — W19XXXA Unspecified fall, initial encounter: Secondary | ICD-10-CM

## 2024-01-29 DIAGNOSIS — S6992XA Unspecified injury of left wrist, hand and finger(s), initial encounter: Secondary | ICD-10-CM | POA: Diagnosis present

## 2024-01-29 DIAGNOSIS — S66902A Unspecified injury of unspecified muscle, fascia and tendon at wrist and hand level, left hand, initial encounter: Secondary | ICD-10-CM

## 2024-01-29 DIAGNOSIS — N1831 Chronic kidney disease, stage 3a: Secondary | ICD-10-CM

## 2024-01-29 LAB — CBC
HCT: 32.1 % — ABNORMAL LOW (ref 36.0–46.0)
Hemoglobin: 10 g/dL — ABNORMAL LOW (ref 12.0–15.0)
MCH: 25 pg — ABNORMAL LOW (ref 26.0–34.0)
MCHC: 31.2 g/dL (ref 30.0–36.0)
MCV: 80.3 fL (ref 80.0–100.0)
Platelets: 250 10*3/uL (ref 150–400)
RBC: 4 MIL/uL (ref 3.87–5.11)
RDW: 15.4 % (ref 11.5–15.5)
WBC: 10.9 10*3/uL — ABNORMAL HIGH (ref 4.0–10.5)
nRBC: 0 % (ref 0.0–0.2)

## 2024-01-29 LAB — URINALYSIS, ROUTINE W REFLEX MICROSCOPIC
Bilirubin Urine: NEGATIVE
Glucose, UA: NEGATIVE mg/dL
Hgb urine dipstick: NEGATIVE
Ketones, ur: NEGATIVE mg/dL
Leukocytes,Ua: NEGATIVE
Nitrite: NEGATIVE
Protein, ur: 30 mg/dL — AB
Specific Gravity, Urine: 1.015 (ref 1.005–1.030)
pH: 5.5 (ref 5.0–8.0)

## 2024-01-29 LAB — URINALYSIS, MICROSCOPIC (REFLEX)

## 2024-01-29 LAB — BASIC METABOLIC PANEL WITH GFR
Anion gap: 12 (ref 5–15)
BUN: 35 mg/dL — ABNORMAL HIGH (ref 8–23)
CO2: 24 mmol/L (ref 22–32)
Calcium: 9.1 mg/dL (ref 8.9–10.3)
Chloride: 103 mmol/L (ref 98–111)
Creatinine, Ser: 1.22 mg/dL — ABNORMAL HIGH (ref 0.44–1.00)
GFR, Estimated: 47 mL/min — ABNORMAL LOW (ref >60)
Glucose, Bld: 92 mg/dL (ref 70–99)
Potassium: 4 mmol/L (ref 3.5–5.1)
Sodium: 139 mmol/L (ref 135–145)

## 2024-01-29 LAB — POCT GLYCOSYLATED HEMOGLOBIN (HGB A1C): Hemoglobin A1C: 6.1 % — AB (ref 4.0–5.6)

## 2024-01-29 LAB — MICROALBUMIN / CREATININE URINE RATIO
Creatinine, Urine: 31 mg/dL (ref 20–275)
Microalb Creat Ratio: 110 mg/g{creat} — ABNORMAL HIGH (ref <30)
Microalb, Ur: 3.4 mg/dL

## 2024-01-29 LAB — CBG MONITORING, ED: Glucose-Capillary: 80 mg/dL (ref 70–99)

## 2024-01-29 MED ORDER — ISOSORBIDE MONONITRATE ER 30 MG PO TB24
120.0000 mg | ORAL_TABLET | Freq: Every day | ORAL | Status: DC
  Administered 2024-01-30 – 2024-02-03 (×5): 120 mg via ORAL
  Filled 2024-01-29 (×5): qty 4

## 2024-01-29 MED ORDER — SACUBITRIL-VALSARTAN 97-103 MG PO TABS
1.0000 | ORAL_TABLET | Freq: Two times a day (BID) | ORAL | Status: DC
  Administered 2024-01-29 – 2024-02-03 (×10): 1 via ORAL
  Filled 2024-01-29 (×14): qty 1

## 2024-01-29 MED ORDER — ALBUTEROL SULFATE HFA 108 (90 BASE) MCG/ACT IN AERS: 1.0000 | INHALATION_SPRAY | RESPIRATORY_TRACT | Status: DC | PRN

## 2024-01-29 MED ORDER — BUMETANIDE 2 MG PO TABS
2.0000 mg | ORAL_TABLET | Freq: Every day | ORAL | Status: DC
  Administered 2024-01-29 – 2024-02-02 (×5): 2 mg via ORAL
  Filled 2024-01-29 (×6): qty 1

## 2024-01-29 MED ORDER — TRAMADOL HCL 50 MG PO TABS
50.0000 mg | ORAL_TABLET | Freq: Once | ORAL | Status: AC
  Administered 2024-01-29: 50 mg via ORAL
  Filled 2024-01-29: qty 1

## 2024-01-29 MED ORDER — ASPIRIN 81 MG PO TBEC
81.0000 mg | DELAYED_RELEASE_TABLET | Freq: Every day | ORAL | Status: DC
  Administered 2024-01-29 – 2024-02-02 (×5): 81 mg via ORAL
  Filled 2024-01-29 (×5): qty 1

## 2024-01-29 MED ORDER — SITAGLIPTIN PHOSPHATE 100 MG PO TABS: 100.0000 mg | ORAL_TABLET | Freq: Every day | ORAL | 3 refills | Status: DC

## 2024-01-29 MED ORDER — HYDRALAZINE HCL 50 MG PO TABS
100.0000 mg | ORAL_TABLET | Freq: Three times a day (TID) | ORAL | Status: DC
  Administered 2024-01-29 – 2024-02-03 (×13): 100 mg via ORAL
  Filled 2024-01-29 (×15): qty 2

## 2024-01-29 MED ORDER — GLIMEPIRIDE 1 MG PO TABS
1.0000 mg | ORAL_TABLET | Freq: Every day | ORAL | Status: DC
  Administered 2024-01-30 – 2024-02-03 (×5): 1 mg via ORAL
  Filled 2024-01-29 (×7): qty 1

## 2024-01-29 MED ORDER — ALBUTEROL SULFATE (2.5 MG/3ML) 0.083% IN NEBU: 2.5000 mg | INHALATION_SOLUTION | RESPIRATORY_TRACT | Status: DC | PRN

## 2024-01-29 MED ORDER — GLIMEPIRIDE 1 MG PO TABS: 1.0000 mg | ORAL_TABLET | Freq: Every day | ORAL | 3 refills | Status: DC

## 2024-01-29 MED ORDER — METFORMIN HCL ER 500 MG PO TB24
500.0000 mg | ORAL_TABLET | Freq: Two times a day (BID) | ORAL | Status: DC
  Administered 2024-01-30 – 2024-02-03 (×9): 500 mg via ORAL
  Filled 2024-01-29 (×13): qty 1

## 2024-01-29 MED ORDER — ATORVASTATIN CALCIUM 40 MG PO TABS
40.0000 mg | ORAL_TABLET | Freq: Every day | ORAL | Status: DC
  Administered 2024-01-29 – 2024-02-02 (×5): 40 mg via ORAL
  Filled 2024-01-29 (×5): qty 1

## 2024-01-29 MED ORDER — LINAGLIPTIN 5 MG PO TABS
5.0000 mg | ORAL_TABLET | Freq: Every day | ORAL | Status: DC
  Administered 2024-01-29 – 2024-02-03 (×5): 5 mg via ORAL
  Filled 2024-01-29 (×6): qty 1

## 2024-01-29 MED ORDER — METOPROLOL SUCCINATE ER 100 MG PO TB24
100.0000 mg | ORAL_TABLET | Freq: Every day | ORAL | Status: DC
  Administered 2024-01-30 – 2024-02-03 (×5): 100 mg via ORAL
  Filled 2024-01-29: qty 4
  Filled 2024-01-29 (×4): qty 1

## 2024-01-29 NOTE — ED Notes (Signed)
 Pt. Is now in room 2 moved from room hall bed 13.  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  She caused injury to the L wrist that now has a splint placed on it. Pt.injured the middle R finger with bruising noted to this finger. Pt. Also injured the L tibia and is and reports pain to place pressure on the L leg.   Pt. Also reports pain in the hips bilat.

## 2024-01-29 NOTE — ED Triage Notes (Signed)
 Was crossing the street when she fell , landed forward , bilateral hand pain and right knee pain .

## 2024-01-29 NOTE — Telephone Encounter (Signed)
 Copied from CRM [id number]. Topic: Clinical - Red Word Triage >> Jan 29, 2024 11:12 AM [place] J wrote: [facility] that prompted transfer to Nurse Triage: Patient hand a fall and hurt left hand. Pain is increasing as time goes by.  Chief Complaint: left hand injury Symptoms: discolored, swollen Frequency: happened this am Pertinent Negatives: Patient denies numbness, tingling Disposition: [] ED /[] Urgent Care (no appt availability in office) / [] Appointment(In office/virtual)/ []  [facility]/ [] Home Care/ [] Refused Recommended Disposition /[] [facility]/ [x]  Follow-up with PCP Additional Notes: instructed to go to ER; patient declined; wants office to call her back; states she is not going to the hospital and sitting there all day.  Care advice given, denies questions; instructed to go to ER if becomes worse.   Reason for Disposition  [1] SEVERE pain AND [2] not improved 2 hours after pain medicine/ice packs  Answer Assessment - Initial Assessment Questions 1. MECHANISM: "How did the injury happen?"     "I was working and fell in the street". 2. ONSET: "When did the injury happen?" (Minutes or hours ago)      This am 3. APPEARANCE of INJURY: "What does the injury look like?"      Left hand, bruising 4. SEVERITY: "Can you use the hand normally?" "Can you bend your fingers into a ball and then fully open them?"     Can moves fingers 5. SIZE: For cuts, bruises, or swelling, ask: "How large is it?" (e.g., inches or centimeters;  entire hand or wrist)      denies 6. PAIN: "Is there pain?" If Yes, ask: "How bad is the pain?"  (Scale 1-10; or mild, moderate, severe)     10/10 7. TETANUS: For any breaks in the skin, ask: "When was the last tetanus booster?"     na 8. OTHER SYMPTOMS: "Do you have any other symptoms?"     States finger on right hand feels numb 9. PREGNANCY: "Is there any chance you are pregnant?" "When was your last menstrual period?"     na  Protocols used:  Hand and Wrist Injury-A-AH

## 2024-01-29 NOTE — ED Notes (Signed)
 [facility] called for transport.

## 2024-01-29 NOTE — Patient Instructions (Addendum)
 Please continue: - Metformin  ER 1000 mg 2x a day with meals  - Januvia  100 mg before breakfast - Amaryl  0.5-1 mg 15 min before a larger meal  Please return in 4-6 months with your sugar log.

## 2024-01-29 NOTE — Progress Notes (Addendum)
 Patient ID: Krista Benjamin, female   DOB: 01/27/1951, 73 y.o.   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  Last visit 4 months ago. She has [employer] + [employer].   Interim history: No increased urination, blurry vision, nausea, chest pain.   Continues to walk for exercise. She has a yeast inf. >> on Nizoral .  Reviewed HbA1c levels: Lab Results  Component Value Date   HGBA1C 6.1 (A) 01/29/2024   HGBA1C 6.6 (A) 09/26/2023   HGBA1C 6.9 (H) 04/04/2023   HGBA1C 6.3 (A) 12/27/2022   HGBA1C 6.7 (A) 08/25/2022   HGBA1C 7.2 (H) 04/01/2022   HGBA1C 6.2 (A) 12/01/2021   HGBA1C 7.1 (A) 07/19/2021   HGBA1C 6.1 (A) 11/20/2020   HGBA1C 6.5 (A) 07/24/2020   HGBA1C 7.7 (A) 03/17/2020   HGBA1C 6.5 (A) 11/12/2019   HGBA1C 6.4 (A) 07/08/2019   HGBA1C 7.3 (H) 04/25/2019   HGBA1C 7.1 (A) 11/09/2018   HGBA1C 7.7 (A) 07/26/2018   HGBA1C 9.3 (A) 04/10/2018   HGBA1C 8.1 11/07/2017   HGBA1C 8.8 08/01/2017   HGBA1C 9.2 04/27/2017   Prev. 5.9% on 12/2012, previously 6.1%.  Pt is on a regimen of: - Metformin  ER 1000 mg 2x a day with meals  - Tradjenta  5 >> Januvia  100 mg before breakfast - per insurance pref. - Amaryl  2 mg before dinner >> at bedtime >> 0.5-1 mg before meals Other medications tried: Rec'd GLP1 agonist >> Victoza covered >> she was afraid of SEs. We tried Jardiance  10 mg daily in am - added 04/2017 >> yeast inf >> stopped We tried Invokana  100 mg in am >> stopped after 3 days >> nausea, diarrhea We stopped Lantus 10 units qhs. We stopped Actos 45 mg (was on it since 07/2001) Metformin  IR 1000 mg bid >> stopped b/c Nausea/Diarrhea She was on Novolog 8 units tid ac, when sugars >140  We stopped Amaryl  4 mg before breakfast in 11/2020 but had to restart 2/2 high CBGs.Krista Benjamin  Pt checks her sugars 1 times a day: - am: 87-140 >>  80-137, 142 >> 119-134, 145 >> 84-108 - 2h after b'fast: 115 >> n/c >> 130 after coffee >> n/c - lunch:  1n/c >> 156 >> 79 >> n/c >> 67, 87-109 >> n/c - 2h after lunch: 303 >> n/c >> 201 >> 104 >> n/c - dinner: 171, 178, 195 (steroids) >> n/c >> 62 >> n/c - 2h after dinner: 197, 210, 231  >> 115-173 >> n/c >> 123 >> 127 - bedtime:  134 >> 107, 211 >> n/c >> 127 >> n/c - night: 75 >> n/c >> 170 >> 145 >> 100 Lowest sugar:  53 >> ... 80 >> 119 >> 96; she has hypoglycemia awareness in the 70s. Highest sugar: 364 >> .Krista AasAaron Benjamin 142 >> 145 >> 127  Glucometer: One Touch Verio Flex  She lost 80 pounds since 2012 >> She was able to come off insulin  then. She saw Krista Benjamin (nutritionist) on 01/01/2015.  -+ Mild CKD. Latest BUN/creatinine was:  Lab Results  Component Value Date   BUN 31 (H) 04/04/2023   Lab Results  Component Value Date   CREATININE 1.02 04/04/2023   Lab Results  Component Value Date   GFR 54.79 (L) 04/04/2023   GFR 46.80 (L) 04/01/2022   GFR 62.11 03/09/2021   GFR 68.89 04/25/2019   GFR 81.11 04/20/2018  GFR 75.32 11/28/2017   GFR 100.72 02/20/2017   GFR 69.70 06/02/2016   GFR 85.07 08/26/2015   GFR 111.88 09/22/2014   Lab Results  Component Value Date   MICRALBCREAT 47 (H) 09/26/2023   MICRALBCREAT 3.4 11/20/2020   MICRALBCREAT 2.3 08/26/2015   MICRALBCREAT 5.5 09/22/2014   MICRALBCREAT 5.4 11/21/2007  On Entresto .  -+ HL.  Latest lipid profile: Lab Results  Component Value Date   CHOL 150 04/04/2023   HDL 48.40 04/04/2023   LDLCALC 84 04/04/2023   TRIG 87.0 04/04/2023   CHOLHDL 3 04/04/2023  On Lipitor 40.  - last eye exam: 07/10/2023: No DR; previous history of background DR; + h/o B cataract sx.  - no numbness and tingling in her legs.  Last foot exam 12/27/2022.  She also has a history of HTN, iron  deficiency anemia-on iron  therapy, aortic valve disease/murmur, left ventricular hypertrophy per EKG, obesity, multinodular goiter, fibroids.  She had  shingles in 11/2014 and 02/2016.  She fell and fractured R wrist 06/2018. She had another, right radial, fracture 01/2019. She was in the ED on 10/05/2021 with hypertensive urgency (systolic BP in the 200s).  ROS: + See HPI  I reviewed pt's medications, allergies, PMH, social hx, family hx, and changes were documented in the history of present illness. Otherwise, unchanged from my initial visit note.  Past Medical History:  Diagnosis Date   Anemia    Aortic stenosis    Mild   Arthritis    Cataract    both eyes  (cataracts removed)   COPD (chronic obstructive pulmonary disease) (HCC)    Diabetes mellitus    sees Krista Benjamin    Gynecological examination    sees Krista Benjamin    Hyperlipidemia    Hypertension    sees Krista Benjamin   Stroke [facility])    TIA   Past Surgical History:  Procedure Laterality Date   CARPAL TUNNEL RELEASE  01/22/2014   Left Hand    CATARACT EXTRACTION     Lens surgery   COLONOSCOPY  02/02/2023   per Dr. Dominic Benjamin, no polyps, no repeats needed   excision of benign cyst     from left maxilla and left maxillary sinus   KNEE SURGERY     RIGHT/LEFT HEART CATH AND CORONARY ANGIOGRAPHY N/A 09/14/2018   Procedure: RIGHT/LEFT HEART CATH AND CORONARY ANGIOGRAPHY;  Surgeon: Benjamin, Krista M, MD;  Location: [facility] INVASIVE CV LAB;  Service: Cardiovascular;  Laterality: N/A;   SHOULDER ARTHROSCOPY W/ ROTATOR CUFF REPAIR Left 09/23/2013   per Dr. Trudie Benjamin at [facility] center in [place]    Social History   Socioeconomic History   Marital status: Married    Spouse name: Not on file   Number of children: Not on file   Years of education: Not on file   Highest education level: Not on file  Occupational History   Not on file  Tobacco Use   Smoking status: Never   Smokeless tobacco: Never  Vaping Use   Vaping status: Never Used  Substance and Sexual Activity   Alcohol  use: No    Alcohol /week: 0.0 standard drinks of alcohol    Drug use: No   Sexual  activity: Not Currently    Birth control/protection: Post-menopausal  Other Topics Concern   Not on file  Social History Narrative   Not on file   Social Drivers of Health   Financial Resource Strain: Low Risk  (05/02/2022)   Overall Financial Resource Strain (CARDIA)  Difficulty of Paying Living Expenses: Not hard at all  Food Insecurity: No Food Insecurity (05/02/2022)   Hunger Vital Sign    Worried About Running Out of Food in the Last Year: Never true    Ran Out of Food in the Last Year: Never true  Transportation Needs: No Transportation Needs (05/02/2022)   PRAPARE - Administrator, Civil Service (Medical): No    Lack of Transportation (Non-Medical): No  Physical Activity: Inactive (05/02/2022)   Exercise Vital Sign    Days of Exercise per Week: 0 days    Minutes of Exercise per Session: 0 min  Stress: No Stress Concern Present (05/02/2022)   [employer] of Occupational Health - Occupational Stress Questionnaire    Feeling of Stress : Not at all  Social Connections: Socially Integrated (05/02/2022)   Social Connection and Isolation Panel [NHANES]    Frequency of Communication with Friends and Family: More than three times a week    Frequency of Social Gatherings with Friends and Family: More than three times a week    Attends Religious Services: More than 4 times per year    Active Member of [employer] or Organizations: Yes    Attends Engineer, structural: More than 4 times per year    Marital Status: Married  Catering manager Violence: Not At Risk (05/02/2022)   Humiliation, Afraid, Rape, and Kick questionnaire    Fear of Current or Ex-Partner: No    Emotionally Abused: No    Physically Abused: No    Sexually Abused: No   Current Outpatient Medications on File Prior to Visit  Medication Sig Dispense Refill   albuterol  (PROVENTIL ) (2.5 MG/3ML) 0.083% nebulizer solution Take 3 mLs (2.5 mg total) by nebulization every 6 (six) hours as needed for  wheezing or shortness of breath. 75 mL 12   albuterol  (VENTOLIN  HFA) 108 (90 Base) MCG/ACT inhaler Inhale 1-2 puffs into the lungs every 4 (four) hours as needed for wheezing or shortness of breath. 1 each 11   Alcohol  Swabs  (ALCOHOL  PREP) 70 % PADS USE TO CHECK BLOOD SUGAR ONCE DAILY 100 each 3   aspirin  EC 81 MG tablet Take 81 mg by mouth at bedtime. Swallow whole.     atorvastatin  (LIPITOR) 40 MG tablet Take 1 tablet (40 mg total) by mouth at bedtime. 90 tablet 3   Blood Glucose Monitoring Suppl (TRUE METRIX GO GLUCOSE METER) w/Device KIT Use as instructed to check blood sugar 2 times daily 1 kit 0   bumetanide  (BUMEX ) 1 MG tablet TAKE 2 TABLETS AT BEDTIME 180 tablet 3   cholecalciferol (VITAMIN D) 1000 UNITS tablet Take 2,000 Units by mouth at bedtime.     EPIPEN  2-PAK 0.3 MG/0.3ML SOAJ injection INJECT 0.3 MLS (0.3 MG TOTAL) INTO THE MUSCLE ONCE. 1 Device 0   Fexofenadine HCl (ALLEGRA PO) Take 1 tablet by mouth daily at 12 noon.     glimepiride  (AMARYL ) 1 MG tablet Take 2 tablets (2 mg total) by mouth daily with breakfast. 180 tablet 3   glucose blood (TRUE METRIX BLOOD GLUCOSE TEST) test strip USE AS INSTRUCTED TO CHECK BLOOD SUGAR TWICE DAILY E11.65 200 strip 3   hydrALAZINE  (APRESOLINE ) 100 MG tablet TAKE 1 TABLET THREE TIMES A DAY 270 tablet 3   HYDROcodone  bit-homatropine (HYCODAN) 5-1.5 MG/5ML syrup Take 5 mLs by mouth every 4 (four) hours as needed. 240 mL 0   Iron , Ferrous Sulfate , 325 (65 Fe) MG TABS Take 1 tablet by mouth daily.  isosorbide  mononitrate (IMDUR ) 120 MG 24 hr tablet Take 1 tablet (120 mg total) by mouth daily. 15 tablet 0   ketoconazole  (NIZORAL ) 2 % cream APPLY TO AFFECTED AREA DAILY 30 g 5   metFORMIN  (GLUCOPHAGE -XR) 500 MG 24 hr tablet TAKE 2 TABLETS TWICE A DAY WITH MEALS 360 tablet 3   metoprolol  succinate (TOPROL -XL) 100 MG 24 hr tablet TAKE 1 TABLET BY MOUTH DAILY. TAKE WITH OR IMMEDIATELY FOLLOWING A MEAL. 90 tablet 3   mupirocin  ointment (BACTROBAN ) 2 %  Place 1 application into the nose 2 (two) times daily. 22 g 1   Na Sulfate-K Sulfate-Mg Sulf 17.5-3.13-1.6 GM/177ML SOLN Take by mouth.     polyvinyl alcohol  (LIQUIFILM TEARS) 1.4 % ophthalmic solution Place 1 drop into both eyes daily as needed for dry eyes.     sacubitril -valsartan  (ENTRESTO ) 97-103 MG Take 1 tablet by mouth 2 (two) times daily. 180 tablet 0   sitaGLIPtin  (JANUVIA ) 100 MG tablet Take 1 tablet (100 mg total) by mouth daily. 7 tablet 0   triamcinolone  cream (KENALOG ) 0.1 % Apply 1 Application topically 2 (two) times daily as needed (eczema). 453.6 g 2   TRUEplus Lancets 33G MISC USE AS INSTRUCTED TO CHECK BLOOD SUGAR 2 TIMES DAILY 200 each 3   No current facility-administered medications on file prior to visit.   Allergies  Allergen Reactions   Bee Venom Anaphylaxis   Family History  Problem Relation Age of Onset   Coronary artery disease Mother    Colon cancer Mother    Coronary artery disease Brother    Cancer Other        fhx   Diabetes Other        fhx   Hyperlipidemia Other        fhx   Hypertension Other        fhx   Stroke Other        fhx   Colon polyps Neg Hx    Esophageal cancer Neg Hx    Rectal cancer Neg Hx    Stomach cancer Neg Hx    PE: BP 136/70   Pulse 72   Ht 5\' 3"  (1.6 Benjamin)   Wt 246 lb 9.6 oz (111.9 kg)   SpO2 97%   BMI 43.68 kg/Benjamin    Wt Readings from Last 10 Encounters:  01/29/24 246 lb 9.6 oz (111.9 kg)  11/01/23 246 lb (111.6 kg)  10/23/23 248 lb 9.6 oz (112.8 kg)  09/26/23 250 lb (113.4 kg)  09/18/23 249 lb (112.9 kg)  05/25/23 237 lb 9.6 oz (107.8 kg)  04/04/23 239 lb (108.4 kg)  03/09/23 249 lb (112.9 kg)  02/02/23 245 lb (111.1 kg)  01/12/23 245 lb (111.1 kg)   Constitutional: overweight, in NAD Eyes:EOMI, no exophthalmos ENT: no thyromegaly, no cervical lymphadenopathy Cardiovascular: RRR, No MRG, + B LE edema. Respiratory: CTA B Musculoskeletal: no deformities Skin: rash B legs Neurological: no tremor with  outstretched hands Diabetic Foot Exam - Simple   Simple Foot Form Diabetic Foot exam was performed with the following findings: Yes 01/29/2024  9:04 AM  Visual Inspection No deformities, no ulcerations, no other skin breakdown bilaterally: Yes Sensation Testing Intact to touch and monofilament testing bilaterally: Yes Pulse Check Posterior Tibialis and Dorsalis pulse intact bilaterally: Yes Comments     ASSESSMENT: 1. DM2, non-insulin -dependent, uncontrolled, with complications - CKD stage 2 - background DR  2.  Obesity class III  3. HL  PLAN:  1.  Patient with longstanding, uncontrolled,  type 2 diabetes, on metformin , DPP 4 inhibitor and sulfonylurea, with improved control.  At last visit, HbA1c was 6.6%, improved.  We did not change her regimen at that time.  Sugars were mostly at goal in the morning with occasional mild hyperglycemic excursions and she was not checking sugars consistently in the day.  I advised her to try to do so. - At today's visit, all of her blood sugars are at goal.  She is still not checking sugars later in the day and I advised her to start.  She had 1 blood sugar checked at that time in the last 2 weeks and this was at goal.  For now, we will continue the same regimen.  I did discuss with her about potentially adding an SGLT2 inhibitor but she does have a yeast infection now, on treatment, and she would like to avoid anything that can increase her risk of yeast infections further. -I advised her to: Patient Instructions  Please continue: - Metformin  ER 1000 mg 2x a day with meals  - Januvia  100 mg before breakfast - Amaryl  0.5-1 mg 15 min before a larger meal  Please return in 4-6 months with your sugar log.   - we checked her HbA1c: 6.1% (lower) - advised to check sugars at different times of the day - 1x a day, rotating check times - advised for yearly eye exams >> she is UTD - she had a slightly elevated ACR at last visit.  Will repeat this  today. - return to clinic in 4-6 months  2.  Obesity class III -She continues metformin  and Januvia  which are both weight neutral.  She is also on low-dose Amaryl  which can be weight inducing. - She gained 13 pounds before last visit.  Weight is stable at today's visit.  3. HL - Latest lipid panel reviewed from 03/2023: LDL above our goal of less than 70, otherwise fractions at goal: Lab Results  Component Value Date   CHOL 150 04/04/2023   HDL 48.40 04/04/2023   LDLCALC 84 04/04/2023   TRIG 87.0 04/04/2023   CHOLHDL 3 04/04/2023  - She continues Lipitor 40 mg daily without side effects   Component     Latest Ref Rng 01/29/2024  Hemoglobin A1C     4.0 - 5.6 % 6.1 !   Microalb, Ur     mg/dL 3.4   MICROALB/CREAT RATIO     <30 mg/g creat 110 (H)   Creatinine, Urine     20 - 275 mg/dL 31   ACR is higher.  She is currently on Entresto , which could help, but she would definitely benefit from an SGLT2 inhibitor.  For now, she has an active yeast infection so would like to hold off.  Will repeat the ACR and discussed again about the possibility of starting an SGLT2 inhibitor at next visit.  Emilie Harden, MD PhD [facility] Endocrinology

## 2024-01-29 NOTE — ED Notes (Signed)
 Patient transferred here. Patient is currently in Hallway Bed 18. Patient is stable. Vital Signs stable. Care ongoing.

## 2024-01-29 NOTE — ED Notes (Signed)
 Pt assisted in wheelchair to restroom. Pt declined assistance in restroom despite fall risk education. Pt expressed understanding of risk of falling. Pt informed to pull help string when finished for assistance.

## 2024-01-29 NOTE — ED Provider Notes (Signed)
 [place] [facility] Provider Note   CSN: [id number] Arrival date & time: 01/29/24  1235     History  Chief Complaint  Patient presents with   Krista Benjamin    Krista Benjamin is a 73 y.o. female.  With past medical history of stage II kidney disease, type 2 diabetes, hyperlipidemia, obesity presented to emergency room with complaint of fall.  Patient reports that when she was crossing the street today she lost her balance had mechanical fall.  She tripped landing forward on her left knee and bilateral outstretched hands.  She was able to catch herself but did hit anterior left forehead against the pavement.  She had no loss of consciousness during fall.  She is not on blood thinner.  She has had some left-sided headache since her fall.  She denies any nausea vomiting or focal deficit.  She is not able to ambulate well secondary to her left knee pain.  She does ambulate with a cane at baseline.  Her left knee pain is over medial aspect of the joint.  She is able to bend the extremity but pain is worsened with weightbearing.  She also has tenderness to the base of her left thumb.  Denies generalized pain to her right wrist. She denies any chest pain shortness of breath, neck pain or abdominal pain. Denies urinary symptoms.    Fall       Home Medications Prior to Admission medications   Medication Sig Start Date End Date Taking? Authorizing Provider  albuterol  (PROVENTIL ) (2.5 MG/3ML) 0.083% nebulizer solution Take 3 mLs (2.5 mg total) by nebulization every 6 (six) hours as needed for wheezing or shortness of breath. 04/04/23   Donley Furth, MD  albuterol  (VENTOLIN  HFA) 108 (90 Base) MCG/ACT inhaler Inhale 1-2 puffs into the lungs every 4 (four) hours as needed for wheezing or shortness of breath. 04/04/23   Donley Furth, MD  Alcohol  Swabs  (ALCOHOL  PREP) 70 % PADS USE TO CHECK BLOOD SUGAR ONCE DAILY 09/18/23   Emilie Harden, MD  aspirin  EC 81 MG  tablet Take 81 mg by mouth at bedtime. Swallow whole.    [provider]  atorvastatin  (LIPITOR) 40 MG tablet Take 1 tablet (40 mg total) by mouth at bedtime. 04/04/23   Donley Furth, MD  Blood Glucose Monitoring Suppl (TRUE METRIX GO GLUCOSE METER) w/Device KIT Use as instructed to check blood sugar 2 times daily 03/09/21   Emilie Harden, MD  bumetanide  (BUMEX ) 1 MG tablet TAKE 2 TABLETS AT BEDTIME 11/04/22   Donley Furth, MD  cholecalciferol (VITAMIN D) 1000 UNITS tablet Take 2,000 Units by mouth at bedtime.    [provider]  EPIPEN  2-PAK 0.3 MG/0.3ML SOAJ injection INJECT 0.3 MLS (0.3 MG TOTAL) INTO THE MUSCLE ONCE. 04/19/16   Donley Furth, MD  Fexofenadine HCl (ALLEGRA PO) Take 1 tablet by mouth daily at 12 noon.    [provider]  glimepiride  (AMARYL ) 1 MG tablet Take 1 tablet (1 mg total) by mouth daily with breakfast. 01/29/24   Emilie Harden, MD  glucose blood (TRUE METRIX BLOOD GLUCOSE TEST) test strip USE AS INSTRUCTED TO CHECK BLOOD SUGAR TWICE DAILY E11.65 03/21/22   Emilie Harden, MD  hydrALAZINE  (APRESOLINE ) 100 MG tablet TAKE 1 TABLET THREE TIMES A DAY 02/14/23   Eilleen Grates, MD  HYDROcodone  bit-homatropine (HYCODAN) 5-1.5 MG/5ML syrup Take 5 mLs by mouth every 4 (four) hours as needed. 10/19/23   Donley Furth,  MD  Iron , Ferrous Sulfate , 325 (65 Fe) MG TABS Take 1 tablet by mouth daily.    [provider]  isosorbide  mononitrate (IMDUR ) 120 MG 24 hr tablet Take 1 tablet (120 mg total) by mouth daily. 09/25/23   Eilleen Grates, MD  ketoconazole  (NIZORAL ) 2 % cream APPLY TO AFFECTED AREA DAILY 04/14/21   Donley Furth, MD  metFORMIN  (GLUCOPHAGE -XR) 500 MG 24 hr tablet TAKE 2 TABLETS TWICE A DAY WITH MEALS 10/02/23   Emilie Harden, MD  metoprolol  succinate (TOPROL -XL) 100 MG 24 hr tablet TAKE 1 TABLET BY MOUTH DAILY. TAKE WITH OR IMMEDIATELY FOLLOWING A MEAL. 03/14/23   Eilleen Grates, MD  mupirocin  ointment (BACTROBAN ) 2 % Place 1  application into the nose 2 (two) times daily. 12/07/18   Donley Furth, MD  Na Sulfate-K Sulfate-Mg Sulf 17.5-3.13-1.6 GM/177ML SOLN Take by mouth. 01/12/23   [provider]  polyvinyl alcohol  (LIQUIFILM TEARS) 1.4 % ophthalmic solution Place 1 drop into both eyes daily as needed for dry eyes.    [provider]  sacubitril -valsartan  (ENTRESTO ) 97-103 MG Take 1 tablet by mouth 2 (two) times daily. 01/05/24   Eilleen Grates, MD  sitaGLIPtin  (JANUVIA ) 100 MG tablet Take 1 tablet (100 mg total) by mouth daily. 01/29/24   Emilie Harden, MD  triamcinolone  cream (KENALOG ) 0.1 % Apply 1 Application topically 2 (two) times daily as needed (eczema). 10/05/23   Donley Furth, MD  TRUEplus Lancets 33G MISC USE AS INSTRUCTED TO CHECK BLOOD SUGAR 2 TIMES DAILY 03/09/21   Emilie Harden, MD      Allergies    Bee venom    Review of Systems   Review of Systems  Musculoskeletal:  Positive for arthralgias.    Physical Exam Updated Vital Signs BP (!) 170/68   Pulse 68   Temp 98.7 F (37.1 C) (Oral)   Resp 16   SpO2 98%  Physical Exam Vitals and nursing note reviewed.  Constitutional:      General: She is not in acute distress.    Appearance: She is not toxic-appearing.  HENT:     Head: Normocephalic and atraumatic.  Eyes:     General: No scleral icterus.    Conjunctiva/sclera: Conjunctivae normal.  Neck:     Comments: No cervical midline tenderness step-off or deformity. Cardiovascular:     Rate and Rhythm: Normal rate and regular rhythm.     Pulses: Normal pulses.     Heart sounds: Normal heart sounds.  Pulmonary:     Effort: Pulmonary effort is normal. No respiratory distress.     Breath sounds: Normal breath sounds.  Abdominal:     General: Abdomen is flat. Bowel sounds are normal.     Palpations: Abdomen is soft.     Tenderness: There is no abdominal tenderness.  Musculoskeletal:     Right lower leg: No edema.     Left lower leg: No edema.     Comments:  Tenderness over base of left 1st digiti, pain limiting supination and pronation. Neurovascular intact.  Able to raise left leg, flex left leg, but limited by pain. TTP over medial joint line.   Skin:    General: Skin is warm and dry.     Findings: No lesion.  Neurological:     General: No focal deficit present.     Mental Status: She is alert and oriented to person, place, and time. Mental status is at baseline.     Comments: Appears atraumatic without TTP over  facial bones, no TTP over head.      ED Results / Procedures / Treatments   Labs (all labs ordered are listed, but only abnormal results are displayed) Labs Reviewed - No data to display  EKG None  Radiology CT Cervical Spine Wo Contrast Result Date: 01/29/2024 CLINICAL DATA:  Fall EXAM: CT CERVICAL SPINE WITHOUT CONTRAST TECHNIQUE: Multidetector CT imaging of the cervical spine was performed without intravenous contrast. Multiplanar CT image reconstructions were also generated. RADIATION DOSE REDUCTION: This exam was performed according to the departmental dose-optimization program which includes automated exposure control, adjustment of the mA and/or kV according to patient size and/or use of iterative reconstruction technique. COMPARISON:  Cervical spine CT 03/22/2012 FINDINGS: Alignment: Normal. Skull base and vertebrae: No acute fracture. No primary bone lesion or focal pathologic process. Soft tissues and spinal canal: No prevertebral fluid or swelling. No visible canal hematoma. Disc levels: There is disc space narrowing and endplate osteophyte formation at C5-C6 and C6-C7 compatible with degenerative change. No significant central canal or neural foraminal stenosis at any level. Upper chest: Negative. Other: Thyroid  gland is heterogeneous. IMPRESSION: 1. No acute fracture or traumatic subluxation of the cervical spine. 2. Degenerative changes at C5-C6 and C6-C7. Electronically Signed   By: Tyron Gallon M.D.   On: 01/29/2024  18:57   CT Head Wo Contrast Result Date: 01/29/2024 CLINICAL DATA:  Trauma EXAM: CT HEAD WITHOUT CONTRAST TECHNIQUE: Contiguous axial images were obtained from the base of the skull through the vertex without intravenous contrast. RADIATION DOSE REDUCTION: This exam was performed according to the departmental dose-optimization program which includes automated exposure control, adjustment of the mA and/or kV according to patient size and/or use of iterative reconstruction technique. COMPARISON:  None Available. FINDINGS: Brain: No evidence of acute infarction, hemorrhage, hydrocephalus, extra-axial collection or mass lesion/mass effect. Vascular: Atherosclerotic calcifications are present within the cavernous internal carotid arteries. Skull: Normal. Negative for fracture or focal lesion. Sinuses/Orbits: No acute finding. Other: None. IMPRESSION: No acute intracranial abnormality. Electronically Signed   By: Tyron Gallon M.D.   On: 01/29/2024 18:54   DG Knee Complete 4 Views Left Result Date: 01/29/2024 CLINICAL DATA:  Fall this morning.  Pain. EXAM: LEFT KNEE - COMPLETE 4+ VIEW COMPARISON:  Left knee radiographs 12/27/2020 FINDINGS: Mild-to-moderate medial compartment joint space narrowing and peripheral osteophytosis. Mild subchondral sclerosis. The lateral compartment joint space is maintained. Mild superior patellar degenerative spurring. Likely degenerative cystic change at the superolateral aspect of the patella on frontal view. There is new curvilinear mineralization in a craniocaudal dimension measuring up to approximately 3 cm in length at the superomedial aspect of the medial femoral condyle at the proximal medial collateral ligament. No acute fracture dislocation. IMPRESSION: 1. New curvilinear mineralization at the superomedial aspect of the medial femoral condyle at the proximal medial collateral ligament. This likely represents the sequela of prior remote medial collateral ligament injury, new  from 12/27/2020. 2. Mild-to-moderate medial and mild patellofemoral compartment osteoarthritis. Electronically Signed   By: Bertina Broccoli M.D.   On: 01/29/2024 14:45   DG Wrist Complete Left Result Date: 01/29/2024 CLINICAL DATA:  Left wrist and hand pain. Injury. Fall this morning. EXAM: LEFT WRIST - COMPLETE 3+ VIEW; LEFT HAND - COMPLETE 3+ VIEW COMPARISON:  None Available. FINDINGS: Left wrist: The proximal aspect of the scapholunate interval measures 3 mm and the distal aspect 2 mm. The lunotriquetral interval measures 1.5 mm. Severe thumb chronic carpal joint space narrowing, subchondral sclerosis, and peripheral osteophytosis. Moderate triscaphe  joint space narrowing and peripheral osteophytosis. Left hand: Moderate thumb interphalangeal and second through fifth DIP and second PIP joint space narrowing and peripheral osteophytosis. No acute fracture or dislocation. IMPRESSION: 1. No acute fracture. 2. 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 3. Moderate thumb interphalangeal and second through fifth DIP and second PIP osteoarthritis. 4. Mild widening of the scapholunate interval. This can be seen with scapholunate ligament injury, age indeterminate. Electronically Signed   By: Bertina Broccoli M.D.   On: 01/29/2024 14:36   DG Hand Complete Left Result Date: 01/29/2024 CLINICAL DATA:  Left wrist and hand pain. Injury. Fall this morning. EXAM: LEFT WRIST - COMPLETE 3+ VIEW; LEFT HAND - COMPLETE 3+ VIEW COMPARISON:  None Available. FINDINGS: Left wrist: The proximal aspect of the scapholunate interval measures 3 mm and the distal aspect 2 mm. The lunotriquetral interval measures 1.5 mm. Severe thumb chronic carpal joint space narrowing, subchondral sclerosis, and peripheral osteophytosis. Moderate triscaphe joint space narrowing and peripheral osteophytosis. Left hand: Moderate thumb interphalangeal and second through fifth DIP and second PIP joint space narrowing and peripheral  osteophytosis. No acute fracture or dislocation. IMPRESSION: 1. No acute fracture. 2. 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 3. Moderate thumb interphalangeal and second through fifth DIP and second PIP osteoarthritis. 4. Mild widening of the scapholunate interval. This can be seen with scapholunate ligament injury, age indeterminate. Electronically Signed   By: Bertina Broccoli M.D.   On: 01/29/2024 14:36   DG Hand Complete Right Result Date: 01/29/2024 CLINICAL DATA:  Injury.  Fall this morning. EXAM: RIGHT HAND - COMPLETE 3+ VIEW COMPARISON:  Right wrist radiographs 01/24/2019, 06/15/2018, right hand radiographs 06/18/2017 FINDINGS: There is diffuse decreased bone mineralization. Postsurgical changes are again seen of trapezium resection and partial resection of the proximal thumb metacarpal. A small corticated ossicle is again seen at the base of the thumb metacarpal. Mild second metacarpophalangeal joint space narrowing and degenerative subchondral cystic change. Moderate second and fourth and mild third and fifth DIP joint space narrowing and peripheral osteophytosis. Moderate thumb interphalangeal osteoarthritis. No acute fracture is seen. No dislocation. IMPRESSION: 1. No acute fracture. 2. Unchanged remote postsurgical changes of trapezium resection and partial resection of the proximal thumb metacarpal. 3. Mild-to-moderate osteoarthritis as above. Electronically Signed   By: Bertina Broccoli M.D.   On: 01/29/2024 14:33    Procedures Procedures    Medications Ordered in ED Medications  traMADol  (ULTRAM ) tablet 50 mg (has no administration in time range)    ED Course/ Medical Decision Making/ A&P Clinical Course as of 01/29/24 1941  Mon Jan 29, 2024  1510 Spint volar wrist and follow up with hand, Dr Jonna Netter by end of the week. CT scan knee, if negative follow up as needed.  [JB]  1526 Patient is unable to ambulate with cane which she uses at baseline.  [JB]  1924 Dr  Val Garin is accepting provider [JB]    Clinical Course User Index [JB] Sabriya Yono, Kandace Organ, PA-C                                 Medical Decision Making Amount and/or Complexity of Data Reviewed Labs: ordered. Radiology: ordered.  Risk Prescription drug management.   This patient presents to the ED for concern of fall, this involves an extensive number of treatment options, and is a complaint that carries with it a high risk of complications and morbidity.  The differential  diagnosis includes intracranial hemorrhage, cervical fracture, strain, sprain, syncope, electrolyte abnormality, dehydration   Co morbidities that complicate the patient evaluation  DM2 Obesity    Additional history obtained:  Additional history obtained from 01/29/24 Endo OV   Lab Tests:  I personally interpreted labs.  The pertinent results include:   CBC, BMP, UA   Imaging Studies ordered:  I ordered imaging studies including Head CT, cervical spine CT, right wrist, left wrist, left knee x-ray. I independently visualized and interpreted imaging which showed left scapholunate space enlargement, acute on chronic appearing left knee injury, diffuse degenerative changes. CT left knee pending.  I agree with the radiologist interpretation   Cardiac Monitoring: / EKG:  The patient was maintained on a cardiac monitor.      Problem List / ED Course / Critical interventions / Medication management  Patient reporting after mechanical fall today. She did hit her head without LOC or AMS. She is not on BT. She does appear atraumatic.  She is hemodynamically stable and well-appearing.  Head appears is atraumatic.  She is alert and oriented answer questions appropriately with no slurred speech.  She has no focal neurodeficit.  She has no cervical midline tenderness.  No step-off or deformity. Will obtain CT imaging of head and cervical spine due to fall and age.  No chest tenderness to palpation or shortness of  breath.  She does have bilateral wrist pain worse on her left side.  She is neurovascularly intact.  Also has left knee pain from when she fell.  She is not able to ambulate.  At baseline she does ambulate with cane but has needed wheelchair to go from bed to bath room with assistance.  She does live at home with significant other, but they do not think they will be able to take care. Will check basic labs and UA. Dispo pending imaging and reassessment. I did place PT/OT and TOC consult for placement and eval.  X-ray without acute fracture however there are signs concerning for scapholunate sprain thus consulted with Ortho.  They recommended hand follow-up at the end of the week and placing a volar splint.  She was placed in a volar splint and knee immobilizer.  Also has left knee MCL finding likely chronic we will obtain a knee CT to further characterize.    I ordered medication including Tramadol  for pain.   Reevaluation of the patient after these medicines showed that the patient improved I have reviewed the patients home medicines and have made adjustments as needed   Plan Needs PT/OT eval. Unable to ambulate with left knee pain and wrist injury.  ED to ED via transport. Dr Val Garin accepting.         Final Clinical Impression(s) / ED Diagnoses Final diagnoses:  Injury of left scapholunate ligament with no instability, initial encounter  Left medial knee pain  Fall, initial encounter    Rx / DC Orders ED Discharge Orders     None         Suzanne Erps Allison Ivory 01/29/24 1942    Tegeler, Marine Sia, MD 01/29/24 2007

## 2024-01-30 ENCOUNTER — Encounter: Payer: Self-pay | Admitting: Internal Medicine

## 2024-01-30 LAB — CBG MONITORING, ED
Glucose-Capillary: 92 mg/dL (ref 70–99)
Glucose-Capillary: 149 mg/dL — ABNORMAL HIGH (ref 70–99)

## 2024-01-30 MED ORDER — ACETAMINOPHEN 325 MG PO TABS
650.0000 mg | ORAL_TABLET | Freq: Four times a day (QID) | ORAL | Status: DC | PRN
  Administered 2024-01-30 – 2024-02-02 (×7): 650 mg via ORAL
  Filled 2024-01-30 (×7): qty 2

## 2024-01-30 NOTE — Evaluation (Addendum)
 Occupational Therapy Evaluation Patient Details Name: Krista Benjamin MRN: [id number] DOB: [birth year] Today's Date: 01/30/2024   History of Present Illness   Patient is a 73 year old female with mechanical fall. Imaging without acute fracture but signs of L scapholunate sprain with volar splint placed,  left knee MCL finding likely chronic with knee immobilizer also placed. History CKD, hyperlipidemia, obesity.     Clinical Impressions Pt currently at mod assist overall for simulated selfcare tasks sit to stand and for functional standing and transfers without assistive device.  Increased pain in the left wrist and left knee is noted.  She is currently tolerating fabricated splint on the left hand and a KI on the left knee.  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  At home she would use a rollator or at times a wheelchair in the house for kitchen tasks.  She states that she was modified independent for selfcare and toileting tasks.  Feel she will benefit from acute care OT at this time in order to progress ADL independence back to a level that her spouse can safely assist.  Recommend continued post acute rehab at  inpatient level, <3 hours/day.        If plan is discharge home, recommend the following:   A little help with walking and/or transfers;A little help with bathing/dressing/bathroom     Functional Status Assessment   Patient has had a recent decline in their functional status and demonstrates the ability to make significant improvements in function in a reasonable and predictable amount of time.     Equipment Recommendations   None recommended by OT      Precautions/Restrictions   Precautions Precautions: Fall Required Braces or Orthoses: Knee Immobilizer - Left;Splint/Cast Knee Immobilizer - Left: On when out of bed or walking Splint/Cast: L short arm splint Restrictions Weight Bearing Restrictions  Per Provider Order: Yes Other Position/Activity Restrictions: No written order but with splint in place on LUE assume NWBing for now     Mobility Bed Mobility Overal bed mobility: Needs Assistance Bed Mobility: Sit to Supine, Supine to Sit     Supine to sit: Min assist Sit to supine: Mod assist   General bed mobility comments: Assist for bringing trunk up to sitting and bringing hips square to the surface.  When getting back in the bed, she needed assist with bringing both LEs back in and bringing trunk to the center.    Transfers Overall transfer level: Needs assistance Equipment used: 1 person hand held assist Transfers: Sit to/from Stand, Bed to chair/wheelchair/BSC Sit to Stand: Mod assist, From elevated surface     Step pivot transfers: Mod assist     General transfer comment: Mod assist for taking steps up the EOB.      Balance Overall balance assessment: Needs assistance, History of Falls Sitting-balance support: Feet supported Sitting balance-Leahy Scale: Fair     Standing balance support: Single extremity supported Standing balance-Leahy Scale: Poor Standing balance comment: Pt needs assist with standing and stepping.                           ADL either performed or assessed with clinical judgement   ADL Overall ADL's : Needs assistance/impaired Eating/Feeding: Set up;Sitting   Grooming: Supervision/safety;Sitting   Upper Body Bathing: Minimal assistance;Sitting   Lower Body Bathing: Moderate assistance;Sit to/from stand   Upper  Body Dressing : Minimal assistance;Sitting   Lower Body Dressing: Moderate assistance;Sit to/from stand   Toilet Transfer: Moderate assistance;Stand-pivot   Toileting- Clothing Manipulation and Hygiene: Moderate assistance;Sit to/from stand       Functional mobility during ADLs: Moderate assistance (stand and take steps up toward the top of the bed.) General ADL Comments: Pt reports being independent with ADLs  prior to admission but used her cane outside of the house and rollator in the house.  She also had a wheelchair that she would sit in when working on simple snack prep, alothough her spouse did most of the cooking and cleaning.  Decreased ability to extend left hand digits fully secondary to pain, provided visual education to continue working on flexing and extending the fingers and to elevate when possible to help prevent edema.     Vision Baseline Vision/History: 0 No visual deficits Ability to See in Adequate Light: 0 Adequate Patient Visual Report: No change from baseline       Perception Perception: Within Functional Limits       Praxis Praxis: WFL       Pertinent Vitals/Pain Pain Assessment Pain Assessment: Faces Faces Pain Scale: Hurts a little bit Pain Location: L wrist, L knee, right middle finger Pain Descriptors / Indicators: Discomfort Pain Intervention(s): Limited activity within patient's tolerance, Repositioned     Extremity/Trunk Assessment Upper Extremity Assessment Upper Extremity Assessment: Generalized weakness;LUE deficits/detail RUE Deficits / Details: AROM WFLs with strength 3+/5 throughout RUE Sensation: WNL LUE Deficits / Details: wrist splint in place. patient is able to activate shoulder, elbow and digit movement, limited digit extension grossly to around 75% of normal.  Strength at elbow and shoulder 3/5.  Slight pain with shoulder flexion reported above 90 degrees. LUE Sensation: WNL LUE Coordination: decreased fine motor;decreased gross motor   Lower Extremity Assessment Lower Extremity Assessment: Defer to PT evaluation   Cervical / Trunk Assessment Cervical / Trunk Assessment: Other exceptions Cervical / Trunk Exceptions: flexed neck at rest with decreased AROM cervical extension   Communication Communication Communication: No apparent difficulties   Cognition Arousal: Alert Behavior During Therapy: WFL for tasks assessed/performed                                  Following commands: Intact       Cueing  General Comments   Cueing Techniques: Verbal cues              Home Living Family/patient expects to be discharged to:: Private residence Living Arrangements: Spouse/significant other Available Help at Discharge: Family;Available PRN/intermittently (spouse works as a crossing guard in the am for an hour or so) Type of Home: House Home Access: Level entry     Home Layout: Two level;1/2 bath on main level;Bed/bath upstairs Alternate Level Stairs-Number of Steps: flight   Bathroom Shower/Tub: Tub/shower unit         Home Equipment: Agricultural consultant (2 wheels);Rollator (4 wheels);Cane - single point;Shower seat;Electric scooter;Wheelchair - manual;Toilet riser   Additional Comments: patient reports she sleeps in a recliner intermittently, however bed rooms are upstairs      Prior Functioning/Environment Prior Level of Function : Independent/Modified Independent;History of Falls (last six months);Working/employed             Mobility 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 Mod I with community ambulation with cane. uses the manual wheelchair round the  home sometimes ADLs Comments: Mod I recently with bathing and dressing.  Spouse takes care of homemanagement tasks.    OT Problem List: Decreased strength;Decreased knowledge of use of DME or AE;Decreased activity tolerance;Impaired balance (sitting and/or standing);Pain   OT Treatment/Interventions: Self-care/ADL training;Balance training;Therapeutic activities;DME and/or AE instruction;Patient/family education;Therapeutic exercise      OT Goals(Current goals can be found in the care plan section)   Acute Rehab OT Goals Patient Stated Goal: Pt did not state this session. OT Goal Formulation: With patient/family Time For Goal Achievement: 02/13/24 Potential to Achieve Goals: Good ADL Goals Pt  Will Perform Grooming: with min assist;standing (wash hands at the sink) Pt Will Perform Lower Body Bathing: sit to/from stand;with min assist Pt Will Perform Lower Body Dressing: with mod assist;sit to/from stand Pt Will Transfer to Toilet: with min assist;ambulating;bedside commode (RW with platform attachment on the left) Pt Will Perform Toileting - Clothing Manipulation and hygiene: with min assist;sit to/from stand Pt/caregiver will Perform Home Exercise Program: Increased ROM;Left upper extremity;With written HEP provided Additional ADL Goal #1: Pt will complete supine to sit EOB with close supervision in preparation for selfcare tasks.   OT Frequency:  Min 1X/week       AM-PAC OT "6 Clicks" Daily Activity     Outcome Measure Help from another person eating meals?: A Little Help from another person taking care of personal grooming?: A Little Help from another person toileting, which includes using toliet, bedpan, or urinal?: A Lot Help from another person bathing (including washing, rinsing, drying)?: A Lot Help from another person to put on and taking off regular upper body clothing?: A Little Help from another person to put on and taking off regular lower body clothing?: A Lot 6 Click Score: 15   End of Session Nurse Communication: Mobility status  Activity Tolerance: Patient tolerated treatment well Patient left: in bed;with call bell/phone within reach  OT Visit Diagnosis: Unsteadiness on feet (R26.81);Other abnormalities of gait and mobility (R26.89);Muscle weakness (generalized) (M62.81);Repeated falls (R29.6);Pain Pain - Right/Left: Left Pain - part of body: Arm;Knee                Time: 7829-5621 OT Time Calculation (min): 30 min Charges:  OT General Charges $OT Visit: 1 Visit OT Evaluation $OT Eval Moderate Complexity: 1 Mod OT Treatments $Self Care/Home Management : 8-22 mins  Ardena Becker, OTR/L Acute Rehabilitation Services  Office  [phone] 01/30/2024

## 2024-01-30 NOTE — Progress Notes (Addendum)
 2:30pm: CSW spoke with RN CM to discuss patient returning home. RN CM agreeable to attempt to obtain [facility] services for patient.  2:10pm: CSW spoke with Jenette Mitchell at [facility] who states facility cannot accept patient due to the open workers compensation case.  1:15pm: CSW spoke with Shirlyn Dowdy of [facility] facilities who states there are no facilities "in network" with workers' compensation, that the accepting facility must negotiate directly with workers comp to obtain payment for services.  1pm: CSW received call from Sgt. Hansel Ley of [employer] who states he is working on patient's workers' compensation claim and is waiting on return call from [employer] to obtain additional information. Sgt. Hansel Ley to return call to CSW once new information is available.  12:30pm: CSW received call from [place] at [facility] who states patient has an open workers' compensation claim open due to her falling at work yesterday so the facility will not receive payment from [facility] for STR.  CSW called patient to inform her of information - she states understanding. Patient states her son resides with her but does not help her in any way. Patient states she will contact Sgt. Hansel Ley who was assisting her with the worker's compensation claim for further discussion.  12:05pm: Insurance authorization was started and approved immediately. [employer] #[id number], next review date is 02/01/24.  CSW waiting for clarity to determine if patient can admit to the facility today or not.  11:45am: CSW spoke with patient and her husband Drexel Gentles at bedside to present bed offers. Patient has chosen to accept bed offer from [facility].  CSW will initiate insurance authorization.  CSW informed admissions at [facility] of bed acceptance.  10am: CSW received message from [place], [place] who states the recommendation is for SNF. Tray states patient's husband is having a procedure tomorrow and is not available to assist her at home. Sherrlyn Dolores  states patient is agreeable to SNF.  CSW will complete FL2 and fax patient's clinicals out to obtain bed offers.  8am: Patient was transferred here from [facility] for a PT/OT evaluation for possible SNF placement.  Therapy team has signed in - CSW will wait for recommendations.  Shepard Dicker, MSW, LCSW Transitions of Care  Clinical Social Worker II [phone]

## 2024-01-30 NOTE — Evaluation (Signed)
 Physical Therapy Evaluation Patient Details Name: Ceira Hoeschen MRN: [id number] DOB: 1951/05/17 Today's Date: 01/30/2024  History of Present Illness  Patient is a 73 year old female with mechanical fall. Imaging without acute fracture but signs of L scapholunate sprain with volar splint placed,  left knee MCL finding likely chronic with knee immobilizer also placed. History CKD, hyperlipidemia, obesity.  Clinical Impression  Patient is agreeable to PT evaluation. She is Mod I at baseline, using a cane for community mobility and a manual wheelchair around the home. She works as a Chief Strategy Officer. She lives in a 2 story home with upstairs bedroom and shower. She lives with her spouse who is scheduled to have surgery tomorrow.  Today the patient reports L wrist and L knee pain. She required maximal assistance for bed mobility. Mod A required for standing with limited standing tolerance secondary to dizziness, pain, and fatigue with activity. She is not at her baseline level of independence and is requesting short term rehab at discharge. Recommend rehabilitation < 3 hours/day after this hospital stay. PT will continue to follow.       If plan is discharge home, recommend the following: A lot of help with walking and/or transfers;A lot of help with bathing/dressing/bathroom;Help with stairs or ramp for entrance;Assist for transportation;Assistance with cooking/housework   Can travel by private vehicle   No    Equipment Recommendations None recommended by PT  Recommendations for Other Services       Functional Status Assessment Patient has had a recent decline in their functional status and demonstrates the ability to make significant improvements in function in a reasonable and predictable amount of time.     Precautions / Restrictions Precautions Precautions: Fall Required Braces or Orthoses: Knee Immobilizer - Left;Splint/Cast Knee Immobilizer - Left: On when out of bed or  walking (when walking) Splint/Cast: L volar splint Restrictions Weight Bearing Restrictions Per Provider Order: No      Mobility  Bed Mobility Overal bed mobility: Needs Assistance Bed Mobility: Sit to Supine, Supine to Sit     Supine to sit: Max assist Sit to supine: Max assist   General bed mobility comments: assistance for LE and trunk support. increased time and effort required    Transfers Overall transfer level: Needs assistance Equipment used: 1 person hand held assist Transfers: Sit to/from Stand Sit to Stand: Mod assist, From elevated surface           General transfer comment: lifting assistance required for standing. cues for LLE positioning with knee immobilizer in place    Ambulation/Gait             Pre-gait activities: standing tolerance of around 1  minute with hand held assistance provided. General Gait Details: not attempted due to poor standing tolerance, dizziness with upright activity. pain  Stairs            Wheelchair Mobility     Tilt Bed    Modified Rankin (Stroke Patients Only)       Balance Overall balance assessment: Needs assistance, History of Falls Sitting-balance support: Feet supported Sitting balance-Leahy Scale: Fair Sitting balance - Comments: no loss of balance with static sitting and with weight shifting   Standing balance support: Single extremity supported Standing balance-Leahy Scale: Poor Standing balance comment: external support required                             Pertinent Vitals/Pain Pain Assessment Pain  Assessment: Faces Faces Pain Scale: Hurts even more Pain Location: L wrist, L knee, right middle finger Pain Descriptors / Indicators: Discomfort Pain Intervention(s): Limited activity within patient's tolerance, Monitored during session, Repositioned    Home Living Family/patient expects to be discharged to:: Private residence Living Arrangements: Spouse/significant  other Available Help at Discharge: Family;Available PRN/intermittently Type of Home: House Home Access: Level entry     Alternate Level Stairs-Number of Steps: flight Home Layout: Two level;1/2 bath on main level;Bed/bath upstairs Home Equipment: Rolling Walker (2 wheels);Rollator (4 wheels);Cane - single point;Shower seat;Electric scooter;Wheelchair - manual Additional Comments: patient reports she sleeps in a recliner intermittently, however bed rooms are upstairs    Prior Function Prior Level of Function : Independent/Modified Independent;History of Falls (last six months);Working/employed             Mobility 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 Mod I with community ambulation with cane. uses the manual wheelchair round the home sometimes ADLs Comments: Mod I     Extremity/Trunk Assessment   Upper Extremity Assessment Upper Extremity Assessment: Right hand dominant;LUE deficits/detail;RUE deficits/detail RUE Deficits / Details: functionally using RUE to faciliate bed mobility. pain in the middle finger with burising noted RUE Sensation: WNL LUE Deficits / Details: wrist splint in place. patient is able to activate shoulder, elbow and digit movement. LUE: Unable to fully assess due to immobilization LUE Sensation: WNL    Lower Extremity Assessment Lower Extremity Assessment: Generalized weakness;RLE deficits/detail;LLE deficits/detail LLE Deficits / Details: knee immobilizer in place. patient is able to activate hip, ankle movement LLE: Unable to fully assess due to immobilization LLE Sensation: decreased light touch (reports decreased sensation in the foot compared to the right side)       Communication   Communication Communication: No apparent difficulties    Cognition Arousal: Alert Behavior During Therapy: WFL for tasks assessed/performed   PT - Cognitive impairments: No apparent impairments                        PT - Cognition Comments: mild decreased recall of prior knee injuries/surgery Following commands: Intact       Cueing Cueing Techniques: Verbal cues     General Comments General comments (skin integrity, edema, etc.): of note, her spouse is having surgery tomorrow and the patient's goal is to go to rehab due to limited caregiver support    Exercises     Assessment/Plan    PT Assessment Patient needs continued PT services  PT Problem List Decreased strength;Decreased range of motion;Decreased activity tolerance;Decreased balance;Decreased mobility;Pain;Decreased safety awareness       PT Treatment Interventions DME instruction;Gait training;Stair training;Functional mobility training;Balance training;Therapeutic exercise;Therapeutic activities;Neuromuscular re-education;Cognitive remediation;Patient/family education;Wheelchair mobility training    PT Goals (Current goals can be found in the Care Plan section)  Acute Rehab PT Goals Patient Stated Goal: to go to rehab PT Goal Formulation: With patient/family Time For Goal Achievement: 02/13/24 Potential to Achieve Goals: Fair    Frequency Min 2X/week     Co-evaluation               AM-PAC PT "6 Clicks" Mobility  Outcome Measure Help needed turning from your back to your side while in a flat bed without using bedrails?: A Lot Help needed moving from lying on your back to sitting on the side of a flat bed without using bedrails?: A Lot Help needed moving to and from a bed  to a chair (including a wheelchair)?: A Lot Help needed standing up from a chair using your arms (e.g., wheelchair or bedside chair)?: A Lot Help needed to walk in hospital room?: Total Help needed climbing 3-5 steps with a railing? : Total 6 Click Score: 10    End of Session Equipment Utilized During Treatment: Left knee immobilizer Activity Tolerance: Patient tolerated treatment well Patient left: in bed;with call bell/phone within  reach;with family/visitor present Nurse Communication: Mobility status PT Visit Diagnosis: History of falling (Z91.81);Difficulty in walking, not elsewhere classified (R26.2)    Time: 1308-6578 PT Time Calculation (min) (ACUTE ONLY): 27 min   Charges:   PT Evaluation $PT Eval Moderate Complexity: 1 Mod PT Treatments $Therapeutic Activity: 8-22 mins PT General Charges $$ ACUTE PT VISIT: 1 Visit         Ozie Bo, PT, MPT   Erlene Hawks 01/30/2024, 9:55 AM

## 2024-01-30 NOTE — TOC CM/SW Note (Signed)
 SW spoke with Andree Bane [phone]) from [facility] department regarding patient, he expressed concerns patient not being able to go home due to safety concerns, patient is not able to ambulate or take care of herself at this time. Sergeant Reed provided SW contact information to to workers comp assigned Engineer, materials [phone]).   SW contacted Tillie Folk regarding patient's matter getting into a SNF for rehab. SW explained the barrier on patient receiving a bed offer from [facility] for SNF but due to patient having an open workers comp claim they are not able to accept the patient until SNF receive more clarity for as workers comp claim. Katie stated she received the case today, she is reviewing it and planned for a nurse case manager to come visit patient at bedside.   SW will follow up with Tillie Folk, [facility] and [place]. Reed.   .Treyton Slimp, MSW, LCSWA Transition of Care  Clinical Social Worker (ED 3-11 Mon-Fri)  [phone]

## 2024-01-30 NOTE — [place] FL2 (Signed)
 [place]  MEDICAID FL2 LEVEL OF CARE FORM     IDENTIFICATION  Patient Name: Krista Benjamin Birthdate: Feb 02, 1951 Sex: female Admission Date (Current Location): 01/29/2024  [facility] and [place] Number:  Producer, television/film/video and Address:  [facility], [address], [place], [place] [address]      Provider Number: [id number]  Attending Physician Name and Address:  System, Provider Not In  Relative Name and Phone Number:       Current Level of Care: Hospital Recommended Level of Care: Skilled Nursing Facility Prior Approval Number:    Date Approved/Denied:   PASRR Number: [id number]  Discharge Plan: SNF    Current Diagnoses: Patient Active Problem List   Diagnosis Date Noted   Eczema 09/18/2023   Diverticulosis of colon 01/05/2023   Diabetes mellitus due to underlying condition with stage 3a chronic kidney disease, without long-term current use of insulin  (HCC) 12/27/2022   Type 2 diabetes mellitus with diabetic neuropathy, unspecified (HCC) 04/01/2022   Educated about COVID-19 virus infection 02/26/2020   Cervical strain 01/24/2019   Dilated cardiomyopathy (HCC) 09/14/2018   Nonrheumatic aortic valve stenosis 08/12/2018   CKD stage 2 due to type 2 diabetes mellitus (HCC) 04/27/2017   Aortic valve disorder 04/26/2010   Obesity, unspecified 05/07/2009   MURMUR 05/07/2009   CHEST PAIN 05/07/2009   LOW BACK PAIN 01/09/2008   ANEMIA-IRON  DEFICIENCY 11/21/2007   Weakness 11/21/2007   Mixed hyperlipidemia 10/26/2007   Essential hypertension 10/26/2007    Orientation RESPIRATION BLADDER Height & Weight     Self, Time, Situation, Place  Normal Continent Weight:   Height:     BEHAVIORAL SYMPTOMS/MOOD NEUROLOGICAL BOWEL NUTRITION STATUS      Continent Diet (Regular Diet)  AMBULATORY STATUS COMMUNICATION OF NEEDS Skin   Extensive Assist Verbally Normal                       Personal Care Assistance Level of Assistance  Bathing,  Dressing, Feeding Bathing Assistance: Maximum assistance Feeding assistance: Limited assistance Dressing Assistance: Maximum assistance     Functional Limitations Info  Sight, Hearing, Speech Sight Info: Adequate Hearing Info: Adequate Speech Info: Adequate    SPECIAL CARE FACTORS FREQUENCY  PT (By licensed PT), OT (By licensed OT)     PT Frequency: 5x weekly OT Frequency: 5x weekly            Contractures Contractures Info: Not present    Additional Factors Info  Code Status, Allergies Code Status Info: Full Code Allergies Info: Bee Venom           Current Medications (01/30/2024):  This is the current hospital active medication list Current Facility-Administered Medications  Medication Dose Route Frequency Provider Last Rate Last Admin   acetaminophen  (TYLENOL ) tablet 650 mg  650 mg Oral Q6H PRN Dorenda Gandy, MD   650 mg at 01/30/24 0941   albuterol  (PROVENTIL ) (2.5 MG/3ML) 0.083% nebulizer solution 2.5 mg  2.5 mg Nebulization Q4H PRN Kingsley, Victoria K, DO       aspirin  EC tablet 81 mg  81 mg Oral QHS Kingsley, Victoria K, DO   81 mg at 01/29/24 2223   atorvastatin  (LIPITOR) tablet 40 mg  40 mg Oral QHS Kingsley, Victoria K, DO   40 mg at 01/29/24 2223   bumetanide  (BUMEX ) tablet 2 mg  2 mg Oral QHS Kingsley, Victoria K, DO   2 mg at 01/29/24 2223   glimepiride  (AMARYL ) tablet 1 mg  1  mg Oral Q breakfast Kingsley, Victoria K, DO   1 mg at 01/30/24 1610   hydrALAZINE  (APRESOLINE ) tablet 100 mg  100 mg Oral TID Kingsley, Victoria K, DO   100 mg at 01/30/24 9604   isosorbide  mononitrate (IMDUR ) 24 hr tablet 120 mg  120 mg Oral Daily Kingsley, Victoria K, DO   120 mg at 01/30/24 5409   linagliptin  (TRADJENTA ) tablet 5 mg  5 mg Oral Daily Kingsley, Victoria K, DO   5 mg at 01/29/24 2223   metFORMIN  (GLUCOPHAGE -XR) 24 hr tablet 500 mg  500 mg Oral BID WC Kingsley, Victoria K, DO   500 mg at 01/30/24 0833   metoprolol  succinate (TOPROL -XL) 24 hr tablet 100 mg  100 mg  Oral Daily Kingsley, Victoria K, DO   100 mg at 01/30/24 8119   sacubitril -valsartan  (ENTRESTO ) 97-103 mg per tablet  1 tablet Oral BID Kingsley, Victoria K, DO   1 tablet at 01/30/24 1478   Current Outpatient Medications  Medication Sig Dispense Refill   albuterol  (PROVENTIL ) (2.5 MG/3ML) 0.083% nebulizer solution Take 3 mLs (2.5 mg total) by nebulization every 6 (six) hours as needed for wheezing or shortness of breath. 75 mL 12   albuterol  (VENTOLIN  HFA) 108 (90 Base) MCG/ACT inhaler Inhale 1-2 puffs into the lungs every 4 (four) hours as needed for wheezing or shortness of breath. 1 each 11   Alcohol  Swabs  (ALCOHOL  PREP) 70 % PADS USE TO CHECK BLOOD SUGAR ONCE DAILY 100 each 3   aspirin  EC 81 MG tablet Take 81 mg by mouth at bedtime. Swallow whole.     atorvastatin  (LIPITOR) 40 MG tablet Take 1 tablet (40 mg total) by mouth at bedtime. 90 tablet 3   Blood Glucose Monitoring Suppl (TRUE METRIX GO GLUCOSE METER) w/Device KIT Use as instructed to check blood sugar 2 times daily 1 kit 0   bumetanide  (BUMEX ) 1 MG tablet TAKE 2 TABLETS AT BEDTIME 180 tablet 3   cholecalciferol (VITAMIN D) 1000 UNITS tablet Take 2,000 Units by mouth at bedtime.     EPIPEN  2-PAK 0.3 MG/0.3ML SOAJ injection INJECT 0.3 MLS (0.3 MG TOTAL) INTO THE MUSCLE ONCE. 1 Device 0   Fexofenadine HCl (ALLEGRA PO) Take 1 tablet by mouth daily at 12 noon.     glimepiride  (AMARYL ) 1 MG tablet Take 1 tablet (1 mg total) by mouth daily with breakfast. 90 tablet 3   glucose blood (TRUE METRIX BLOOD GLUCOSE TEST) test strip USE AS INSTRUCTED TO CHECK BLOOD SUGAR TWICE DAILY E11.65 200 strip 3   hydrALAZINE  (APRESOLINE ) 100 MG tablet TAKE 1 TABLET THREE TIMES A DAY 270 tablet 3   HYDROcodone  bit-homatropine (HYCODAN) 5-1.5 MG/5ML syrup Take 5 mLs by mouth every 4 (four) hours as needed. 240 mL 0   Iron , Ferrous Sulfate , 325 (65 Fe) MG TABS Take 1 tablet by mouth daily.     isosorbide  mononitrate (IMDUR ) 120 MG 24 hr tablet Take 1 tablet  (120 mg total) by mouth daily. 15 tablet 0   ketoconazole  (NIZORAL ) 2 % cream APPLY TO AFFECTED AREA DAILY 30 g 5   metFORMIN  (GLUCOPHAGE -XR) 500 MG 24 hr tablet TAKE 2 TABLETS TWICE A DAY WITH MEALS 360 tablet 3   metoprolol  succinate (TOPROL -XL) 100 MG 24 hr tablet TAKE 1 TABLET BY MOUTH DAILY. TAKE WITH OR IMMEDIATELY FOLLOWING A MEAL. 90 tablet 3   mupirocin  ointment (BACTROBAN ) 2 % Place 1 application into the nose 2 (two) times daily. 22 g 1   Na Sulfate-K  Sulfate-Mg Sulf 17.5-3.13-1.6 GM/177ML SOLN Take by mouth.     polyvinyl alcohol  (LIQUIFILM TEARS) 1.4 % ophthalmic solution Place 1 drop into both eyes daily as needed for dry eyes.     sacubitril -valsartan  (ENTRESTO ) 97-103 MG Take 1 tablet by mouth 2 (two) times daily. 180 tablet 0   sitaGLIPtin  (JANUVIA ) 100 MG tablet Take 1 tablet (100 mg total) by mouth daily. 90 tablet 3   triamcinolone  cream (KENALOG ) 0.1 % Apply 1 Application topically 2 (two) times daily as needed (eczema). 453.6 g 2   TRUEplus Lancets 33G MISC USE AS INSTRUCTED TO CHECK BLOOD SUGAR 2 TIMES DAILY 200 each 3     Discharge Medications: Please see discharge summary for a list of discharge medications.  Relevant Imaging Results:  Relevant Lab Results:   Additional Information SSN: [id number]  Dexter Forest, LCSW

## 2024-01-30 NOTE — Care Management (Signed)
 ED RNCM  received secure chat message concerning [facility] PT/OT services which have been arranged with [facility].  RNCM met with patient at bedside to discuss the transitional plan of care.  Patient reports that she is working with Workers Comp on [employer] placement. Patient has L arm splint and L knee immobilizer s/p fall and is complaining of pain.  Patient states, she is unable to care for herself in this condition she lives at home with her husband who is scheduled for shoulder surgery tomorrow.  Noted PT/OT recommendations is for short term Rehab.  ED SW will continue to follow for a safe transitional care plan.   Updated EDP Dr. Scarlette Currier.

## 2024-01-31 LAB — CBG MONITORING, ED: Glucose-Capillary: 171 mg/dL — ABNORMAL HIGH (ref 70–99)

## 2024-01-31 NOTE — Progress Notes (Addendum)
 1:35pm: CSW sent secure email to [place] with patient's clinicals attached to aide in discharge planning efforts.  1:05pm: CSW spoke with Fredrik Jensen to discuss patient. Fredrik Jensen states Baldemar Bond is the claims adjuster who handles the financial aspects of the claim. Fredrik Jensen states she will reach out to [place] in admissions at [facility] to determine if the facility is willing to negotiate and complete a one time case agreement so that patient can go to STR at [facility].  11:45am: CSW received call from [place] who states she is working with the adjuster to obtain a RN CM Fredrik Jensen @ [phone]) to assist with discharge planning.  CSW attempted to reach [place] without success - a voicemail was left requesting a return call.  7:50am: There are no discharge planning updates available.  Patient is not able to be discharged to a SNF due to an open workers' compensation claim.  CSW attempted to reach Katie at [phone] without success - a voicemail was left requesting a return call.  Shepard Dicker, MSW, LCSW Transitions of Care  Clinical Social Worker II [phone]

## 2024-01-31 NOTE — Progress Notes (Signed)
 Physical Therapy Treatment Patient Details Name: Krista Benjamin MRN: [id number] DOB: [birth year] Today's Date: 01/31/2024   History of Present Illness Patient is a 73 year old female with mechanical fall. Imaging without acute fracture but signs of L scapholunate sprain with volar splint placed,  left knee MCL finding likely chronic with knee immobilizer also placed. History CKD, hyperlipidemia, obesity.    PT Comments  Patient is agreeable to PT session and eager to get up today. She reports only mild headache pain at this time. The patient continues to require physical assistance with bed mobility and transfers. Gait training initiated with platform rolling walker. She required Min A for short distance ambulation with activity tolerance limited by fatigue. She has limited support at home at this time and would benefit from rehabilitation < 3 hours/day. PT will continue to follow to maximize independence and to facilitate return to prior level of function.    If plan is discharge home, recommend the following: A lot of help with walking and/or transfers;A lot of help with bathing/dressing/bathroom;Help with stairs or ramp for entrance;Assist for transportation;Assistance with cooking/housework   Can travel by private vehicle     No  Equipment Recommendations  Rolling walker (2 wheels) (with left platform attachment)    Recommendations for Other Services       Precautions / Restrictions Precautions Precautions: Fall Required Braces or Orthoses: Knee Immobilizer - Left;Splint/Cast Knee Immobilizer - Left: On when out of bed or walking Splint/Cast: L short arm splint Restrictions Other Position/Activity Restrictions: No written order but with splint in place on LUE assume NWBing for now     Mobility  Bed Mobility Overal bed mobility: Needs Assistance Bed Mobility: Supine to Sit, Sit to Supine     Supine to sit: Mod assist Sit to supine: Mod assist   General bed mobility  comments: assistance for LLE support. verbal cues for technique    Transfers Overall transfer level: Needs assistance Equipment used: Left platform walker Transfers: Sit to/from Stand Sit to Stand: Mod assist, From elevated surface           General transfer comment: lifting and lowering assistance provided. cues for LLE positioning with sitting and standing. patient maintains NWB of LUE throughout session    Ambulation/Gait Ambulation/Gait assistance: Min assist Gait Distance (Feet): 10 Feet Assistive device: Left platform walker Gait Pattern/deviations: Step-to pattern, Decreased stance time - left, Decreased step length - left Gait velocity: decreased     General Gait Details: verbal cues for correct use of platform and for rolling walker and LE sequencing. with LUE, patient weight bearing through L elbow only using platform attachment. no pain in the L knee or L wrist reported with mobility. activity tolerance limited by fatigue   Stairs             Wheelchair Mobility     Tilt Bed    Modified Rankin (Stroke Patients Only)       Balance Overall balance assessment: Needs assistance, History of Falls Sitting-balance support: Feet supported Sitting balance-Leahy Scale: Fair     Standing balance support: Single extremity supported Standing balance-Leahy Scale: Poor Standing balance comment: external support required                            Communication Communication Communication: No apparent difficulties  Cognition Arousal: Alert Behavior During Therapy: WFL for tasks assessed/performed   PT - Cognitive impairments: No apparent impairments  Following commands: Intact      Cueing Cueing Techniques: Verbal cues  Exercises      General Comments        Pertinent Vitals/Pain Pain Assessment Pain Assessment: Faces Faces Pain Scale: Hurts a little bit Pain Location: headache Pain Descriptors /  Indicators: Discomfort Pain Intervention(s): Monitored during session    Home Living                          Prior Function            PT Goals (current goals can now be found in the care plan section) Acute Rehab PT Goals Patient Stated Goal: to go to rehab PT Goal Formulation: With patient/family Time For Goal Achievement: 02/13/24 Potential to Achieve Goals: Fair Progress towards PT goals: Progressing toward goals    Frequency    Min 2X/week      PT Plan      Co-evaluation              AM-PAC PT "6 Clicks" Mobility   Outcome Measure  Help needed turning from your back to your side while in a flat bed without using bedrails?: A Lot Help needed moving from lying on your back to sitting on the side of a flat bed without using bedrails?: A Lot Help needed moving to and from a bed to a chair (including a wheelchair)?: A Lot Help needed standing up from a chair using your arms (e.g., wheelchair or bedside chair)?: A Lot Help needed to walk in hospital room?: Total Help needed climbing 3-5 steps with a railing? : Total 6 Click Score: 10    End of Session Equipment Utilized During Treatment: Left knee immobilizer Activity Tolerance: Patient tolerated treatment well Patient left: in bed;with call bell/phone within reach;with bed alarm set Nurse Communication: Mobility status PT Visit Diagnosis: History of falling (Z91.81);Difficulty in walking, not elsewhere classified (R26.2)     Time: 1610-9604 PT Time Calculation (min) (ACUTE ONLY): 25 min  Charges:    $Gait Training: 8-22 mins $Therapeutic Activity: 8-22 mins PT General Charges $$ ACUTE PT VISIT: 1 Visit                     Ozie Bo, PT, MPT    Erlene Hawks 01/31/2024, 9:05 AM

## 2024-02-01 LAB — CBG MONITORING, ED
Glucose-Capillary: 242 mg/dL — ABNORMAL HIGH (ref 70–99)
Glucose-Capillary: 134 mg/dL — ABNORMAL HIGH (ref 70–99)
Glucose-Capillary: 167 mg/dL — ABNORMAL HIGH (ref 70–99)

## 2024-02-01 NOTE — Telephone Encounter (Signed)
 Pt was seen at the ED on 01/29/24 for this

## 2024-02-01 NOTE — Progress Notes (Addendum)
Licensed Clinical Social Worker is seeking post-discharge placement for this patient at the following level of care: SNF    

## 2024-02-01 NOTE — ED Provider Notes (Signed)
  Physical Exam  BP (!) 124/57   Pulse 69   Temp 98.3 F (36.8 C) (Oral)   Resp 18   SpO2 100%   Physical Exam  Procedures  Procedures  ED Course / MDM   Clinical Course as of 02/01/24 0759  Mon Jan 29, 2024  1510 Spint volar wrist and follow up with hand, Dr Jonna Netter by end of the week. CT scan knee, if negative follow up as needed.  [JB]  1526 Patient is unable to ambulate with cane which she uses at baseline.  [JB]  1924 Dr Val Garin is accepting provider [JB]    Clinical Course User Index [JB] Barrett, Kandace Organ, PA-C   Medical Decision Making Amount and/or Complexity of Data Reviewed Labs: ordered. Radiology: ordered.  Risk Prescription drug management.  Patient pending placement.  Short-term rehab has been recommended because patient reportedly cannot manage ADLs at home.  However reportedly having issues getting accepted due to open Workmen's Comp. claim.  Transitions of care is following.  No issues overnight.       Mozell Arias, MD 02/01/24 0800

## 2024-02-01 NOTE — Progress Notes (Addendum)
 2pm: CSW spoke with Sallyanne Creamer at [facility] to provide her with contact information for Baldemar Bond, workers' Therapist, sports.  CSW attempted to reach [place] without success. Sallyanne Creamer states she has left Galvin Jules a voicemail requesting a return call.  CSW spoke with Sgt. Hansel Ley who states CSW needs to call Tillie Folk at [phone] who states she will contact Marvin's supervisor and then return call to CSW.  12:25pm: CSW spoke with Shelvy Dickens at [facility] who states the facility can accept the worker's comp case.  CSW spoke with Sallyanne Creamer at [facility] who confirms patient can be accepted there and a private room is available.  CSW spoke with patient to offer her the bed at [facility] and she is in agreement to accept offer. Patient states she will inform her husband of all information regarding discharge plan. Patient states she is ready to go to the facility today.  9:45am: CSW spoke with [place] at [facility] who states the facility cannot accept patient as a workers' comp case.  CSW spoke with Star at [facility] to provide her with contact information for Fredrik Jensen, RN CM to discuss the possibility of facility accepting patient.  8am: CSW spoke with patient to obtain additional information regarding her top 3 SNF choice per request of Fredrik Jensen, RN CM for the workers' compensation claim. Patient states she prefers [address], [place], or [facility]. All questions answered. CSW will continue speaking with RN CM to determine if any facility is willing to accept contract from workers' Public librarian.  Shepard Dicker, MSW, LCSW Transitions of Care  Clinical Social Worker II [phone]

## 2024-02-01 NOTE — Progress Notes (Signed)
   02/01/24 0755  TOC Barriers to Discharge  Barriers to Discharge Other (must enter comment) (ED workman's comp case requiring SNF placement)

## 2024-02-02 LAB — CBG MONITORING, ED: Glucose-Capillary: 135 mg/dL — ABNORMAL HIGH (ref 70–99)

## 2024-02-02 MED ORDER — HYDROCODONE-ACETAMINOPHEN 5-325 MG PO TABS
1.0000 | ORAL_TABLET | Freq: Four times a day (QID) | ORAL | Status: DC | PRN
  Administered 2024-02-02 – 2024-02-03 (×4): 1 via ORAL
  Filled 2024-02-02 (×4): qty 1

## 2024-02-02 NOTE — ED Provider Notes (Addendum)
 Emergency Medicine Observation Re-evaluation Note  Krista Benjamin is a 73 y.o. female, seen on rounds today.  Pt initially presented to the ED for complaints of Fall Currently, the patient is awaiting placement into short-term rehab following distal left forearm injury and difficulty with her left knee.  Physical Exam  BP 118/62   Pulse 81   Temp 98.3 F (36.8 C) (Oral)   Resp 16   SpO2 95%  Physical Exam General: Nontoxic no acute distress Cardiac:  Lungs: No respiratory distress Extremity: Volar splint left arm good cap refill to the fingers.  Knee immobilizer left knee.  ED Course / MDM  EKG:   I have reviewed the labs performed to date as well as medications administered while in observation.  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  Plan  Current plan is for placement into short-term rehab.    [name], MD 02/02/24 [phone]  I have ordered hydrocodone  for extra pain relief for the patient.  She only had Tylenol  ordered.    Satin Boal, MD 02/02/24 1130

## 2024-02-02 NOTE — ED Notes (Signed)
 New bed pad placed, pt turned to right side per request.

## 2024-02-02 NOTE — Progress Notes (Addendum)
 2:20pm: CSW sent additional email to Baldemar Bond requesting updated information on discharge - currently waiting for response.  12pm: CSW sent most recent clinicals to Baldemar Bond for review via secure email.  11am: CSW has been in communication with Sallyanne Creamer at [facility] and Fredrik Jensen and Baldemar Bond regarding patient's workers' compensation claim. No clarity has been obtained regarding what the barrier to discharge is.  8:15am: CSW spoke with Sallyanne Creamer at [facility] who states she will notify CSW once everything is done with the workers' Tree surgeon.  Shepard Dicker, MSW, LCSW Transitions of Care  Clinical Social Worker II [phone]

## 2024-02-03 LAB — CBG MONITORING, ED: Glucose-Capillary: 109 mg/dL — ABNORMAL HIGH (ref 70–99)

## 2024-02-03 NOTE — ED Notes (Signed)
 Report called to Janine at [facility].

## 2024-02-03 NOTE — Discharge Instructions (Signed)
 Call the telephone numbers below to arrange for outpatient follow-up for reassessment of your recent injuries.

## 2024-02-03 NOTE — Progress Notes (Signed)
 CSW received a phone call from [place] in admissions at [facility] stating that the patient's insurance authorization has been approved. Sallyanne Creamer stated the patient can admit this morning.   CSW notified patient at bedside of her discharge. CSW gave the number for report and room number [phone], [phone]  to the patient's RN. ED provider also made aware of discharge,.

## 2024-02-03 NOTE — ED Provider Notes (Signed)
 Emergency Medicine Observation Re-evaluation Note  Krista Benjamin is a 73 y.o. female, seen on rounds today.  Pt initially presented to the ED for complaints of Fall Currently, the patient is eating breakfast.  Physical Exam  BP (!) 123/54   Pulse 76   Temp 98.4 F (36.9 C) (Oral)   Resp 16   SpO2 95%  Physical Exam General: Awake, alert, nondistressed Cardiac: Extremities well-perfused Lungs: Breathing is unlabored Psych: No agitation  ED Course / MDM  EKG:   I have reviewed the labs performed to date as well as medications administered while in observation.  Recent changes in the last 24 hours include coordination with social work to arrange for skilled nursing facility placement.  Plan  Current plan is for discharge to SNF.    Iva Mariner, MD 02/03/24 [phone]

## 2024-03-05 NOTE — Progress Notes (Unsigned)
  Cardiology Office Note:   Date:  03/07/2024  ID:  Jo-Ann Johanning, DOB November 20, 1950, MRN [id number] PCP: Donley Furth, MD  [facility] Providers Cardiologist:  Eilleen Grates, MD {  History of Present Illness:   Krista Benjamin is a 73 y.o. female who presents for followup of a mildly reduced  EF 40 - 45%.   She had a cath and had minimal CAD.   Her EF was lower at 35 - 40%.     Follow-up echo demonstrated the EF to be up to 50 to 55%.  That echo was 2021.     Last year she had an echo with normal LV function.    She denies any new cardiovascular symptoms.  Unfortunately she fell recently and has an injury to her leg.  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  She did not have syncope. The patient denies any new symptoms such as chest discomfort, neck or arm discomfort. There has been no new shortness of breath, PND or orthopnea. There have been no reported palpitations, presyncope or syncope.   ROS: As stated in the HPI and negative for all other systems.  Studies Reviewed:    EKG:   EKG Interpretation Date/Time:  Thursday Mar 07 2024 09:11:40 EDT Ventricular Rate:  69 PR Interval:  148 QRS Duration:  148 QT Interval:  434 QTC Calculation: 465 R Axis:   -46  Text Interpretation: Normal sinus rhythm Left axis deviation Left bundle branch block When compared with ECG of 12-Jul-2020 17:16, Confirmed by Eilleen Grates ([id number]) on 03/07/2024 9:27:21 AM   Risk Assessment/Calculations:              Physical Exam:   VS:  BP 120/70   Pulse 72   Ht 5\' 3"  (1.6 m)   Wt 229 lb (103.9 kg)   SpO2 97%   BMI 40.57 kg/m    Wt Readings from Last 3 Encounters:  03/07/24 229 lb (103.9 kg)  03/06/24 247 lb (112 kg)  01/29/24 246 lb 9.6 oz (111.9 kg)     GEN: Well nourished, well developed in no acute distress NECK: No JVD; No carotid bruits CARDIAC: RRR, very brief soft apical systolic murmur nonradiating, no diastolic murmurs, rubs,  gallops RESPIRATORY:  Clear to auscultation without rales, wheezing or rhonchi  ABDOMEN: Soft, non-tender, non-distended EXTREMITIES:  No edema; No deformity   ASSESSMENT AND PLAN:   CARDIOMYOPATHY:    EF was normal in July 2024.  She has no new symptoms.  I think she can be followed clinically.  No change in therapy.   HTN:  Her blood pressure is at target.  No change in therapy.  ABNORMAL EKG: The patient has no significant change in her EKG and no symptomatic bradycardia arrhythmias.  No change in the therapy or further workup is indicated at this point      Follow up with me as needed.   Signed, Eilleen Grates, MD

## 2024-03-06 ENCOUNTER — Encounter: Payer: Self-pay | Admitting: Family Medicine

## 2024-03-06 ENCOUNTER — Ambulatory Visit (INDEPENDENT_AMBULATORY_CARE_PROVIDER_SITE_OTHER): Admitting: Family Medicine

## 2024-03-06 VITALS — BP 128/74 | HR 66 | Temp 98.0°F | Wt 247.0 lb

## 2024-03-06 DIAGNOSIS — L309 Dermatitis, unspecified: Secondary | ICD-10-CM

## 2024-03-06 DIAGNOSIS — E1140 Type 2 diabetes mellitus with diabetic neuropathy, unspecified: Secondary | ICD-10-CM | POA: Diagnosis not present

## 2024-03-06 DIAGNOSIS — I420 Dilated cardiomyopathy: Secondary | ICD-10-CM

## 2024-03-06 DIAGNOSIS — E1122 Type 2 diabetes mellitus with diabetic chronic kidney disease: Secondary | ICD-10-CM

## 2024-03-06 DIAGNOSIS — I10 Essential (primary) hypertension: Secondary | ICD-10-CM | POA: Diagnosis not present

## 2024-03-06 DIAGNOSIS — N182 Chronic kidney disease, stage 2 (mild): Secondary | ICD-10-CM

## 2024-03-06 DIAGNOSIS — E782 Mixed hyperlipidemia: Secondary | ICD-10-CM

## 2024-03-06 LAB — CBC WITH DIFFERENTIAL/PLATELET
Basophils Absolute: 0 10*3/uL (ref 0.0–0.1)
Basophils Relative: 0.1 % (ref 0.0–3.0)
Eosinophils Absolute: 0 10*3/uL (ref 0.0–0.7)
Eosinophils Relative: 0.7 % (ref 0.0–5.0)
HCT: 31 % — ABNORMAL LOW (ref 36.0–46.0)
Hemoglobin: 10 g/dL — ABNORMAL LOW (ref 12.0–15.0)
Lymphocytes Relative: 31.5 % (ref 12.0–46.0)
Lymphs Abs: 2 10*3/uL (ref 0.7–4.0)
MCHC: 32.1 g/dL (ref 30.0–36.0)
MCV: 77.2 fl — ABNORMAL LOW (ref 78.0–100.0)
Monocytes Absolute: 0.5 10*3/uL (ref 0.1–1.0)
Monocytes Relative: 7.9 % (ref 3.0–12.0)
Neutro Abs: 3.7 10*3/uL (ref 1.4–7.7)
Neutrophils Relative %: 59.8 % (ref 43.0–77.0)
Platelets: 184 10*3/uL (ref 150.0–400.0)
RBC: 4.01 Mil/uL (ref 3.87–5.11)
RDW: 15.3 % (ref 11.5–15.5)
WBC: 6.2 10*3/uL (ref 4.0–10.5)

## 2024-03-06 LAB — BASIC METABOLIC PANEL WITH GFR
BUN: 46 mg/dL — ABNORMAL HIGH (ref 6–23)
CO2: 28 meq/L (ref 19–32)
Calcium: 9.2 mg/dL (ref 8.4–10.5)
Chloride: 102 meq/L (ref 96–112)
Creatinine, Ser: 1.65 mg/dL — ABNORMAL HIGH (ref 0.40–1.20)
GFR: 30.56 mL/min — ABNORMAL LOW (ref >60.00)
Glucose, Bld: 82 mg/dL (ref 70–99)
Potassium: 4.5 meq/L (ref 3.5–5.1)
Sodium: 140 meq/L (ref 135–145)

## 2024-03-06 LAB — HEPATIC FUNCTION PANEL
ALT: 6 U/L (ref 0–35)
AST: 13 U/L (ref 0–37)
Albumin: 4.2 g/dL (ref 3.5–5.2)
Alkaline Phosphatase: 58 U/L (ref 39–117)
Bilirubin, Direct: 0 mg/dL (ref 0.0–0.3)
Total Bilirubin: 0.5 mg/dL (ref 0.2–1.2)
Total Protein: 7.6 g/dL (ref 6.0–8.3)

## 2024-03-06 LAB — TSH: TSH: 1 u[IU]/mL (ref 0.35–5.50)

## 2024-03-06 LAB — LIPID PANEL
Cholesterol: 143 mg/dL (ref 0–200)
HDL: 44.3 mg/dL (ref >39.00)
LDL Cholesterol: 79 mg/dL (ref 0–99)
NonHDL: 98.49
Total CHOL/HDL Ratio: 3
Triglycerides: 96 mg/dL (ref 0.0–149.0)
VLDL: 19.2 mg/dL (ref 0.0–40.0)

## 2024-03-06 MED ORDER — OMEPRAZOLE 40 MG PO CPDR: 40.0000 mg | DELAYED_RELEASE_CAPSULE | Freq: Every day | ORAL | 3 refills | Status: AC

## 2024-03-06 MED ORDER — ATORVASTATIN CALCIUM 40 MG PO TABS: 40.0000 mg | ORAL_TABLET | Freq: Every day | ORAL | 3 refills | Status: AC

## 2024-03-06 MED ORDER — KETOCONAZOLE 2 % EX CREA: 1.0000 | TOPICAL_CREAM | Freq: Two times a day (BID) | CUTANEOUS | 5 refills | Status: AC | PRN

## 2024-03-06 MED ORDER — TRIAMCINOLONE ACETONIDE 0.1 % EX CREA: 1.0000 | TOPICAL_CREAM | Freq: Two times a day (BID) | CUTANEOUS | 5 refills | Status: AC | PRN

## 2024-03-06 NOTE — Progress Notes (Signed)
 Subjective:    Patient ID: Krista Benjamin, female    DOB: 06-Feb-1951, 73 y.o.   MRN: [id number]  HPI Here to follow up on issues. She fell on 01-29-24 and she injured a ligament in the left wrist and she sprained the left knee. She will follow up with Dr. Glenora Laos and Dr. Brunilda Capra at [facility] for these injuries. She sees Dr. Aldona Amel for her diabetes, and her A1c last month was 6.1%. she will see Dr. Lavonne Prairie tomorrow for a Cardiology follow up. Her main complaint for us  today is nausea after eating (no vomiting) and decreased appetite. She does have frequent heartburn as well.    Review of Systems  Constitutional: Negative.   HENT: Negative.    Eyes: Negative.   Respiratory: Negative.    Cardiovascular: Negative.   Gastrointestinal:  Positive for nausea. Negative for abdominal distention, abdominal pain, blood in stool, constipation and vomiting.  Genitourinary:  Negative for decreased urine volume, difficulty urinating, dyspareunia, dysuria, enuresis, flank pain, frequency, hematuria, pelvic pain and urgency.  Musculoskeletal:  Positive for arthralgias.  Skin: Negative.   Neurological: Negative.  Negative for headaches.  Psychiatric/Behavioral: Negative.         Objective:   Physical Exam Constitutional:      General: She is not in acute distress.    Appearance: She is well-developed. She is obese.     Comments: In a wheelchair, wearing a brace on the left knee  HENT:     Head: Normocephalic and atraumatic.     Right Ear: External ear normal.     Left Ear: External ear normal.     Nose: Nose normal.     Mouth/Throat:     Pharynx: No oropharyngeal exudate.  Eyes:     General: No scleral icterus.    Conjunctiva/sclera: Conjunctivae normal.     Pupils: Pupils are equal, round, and reactive to light.  Neck:     Thyroid : No thyromegaly.     Vascular: No JVD.  Cardiovascular:     Rate and Rhythm: Normal rate and regular rhythm.     Heart sounds: Normal heart  sounds. No murmur heard.    No friction rub. No gallop.  Pulmonary:     Effort: Pulmonary effort is normal. No respiratory distress.     Breath sounds: Normal breath sounds. No wheezing or rales.  Chest:     Chest wall: No tenderness.  Abdominal:     General: Bowel sounds are normal. There is no distension.     Palpations: Abdomen is soft. There is no mass.     Tenderness: There is no abdominal tenderness. There is no guarding or rebound.  Musculoskeletal:        General: No tenderness. Normal range of motion.     Cervical back: Normal range of motion and neck supple.  Lymphadenopathy:     Cervical: No cervical adenopathy.  Skin:    General: Skin is warm and dry.     Findings: No erythema or rash.  Neurological:     Mental Status: She is alert and oriented to person, place, and time.     Cranial Nerves: No cranial nerve deficit.     Motor: No abnormal muscle tone.     Coordination: Coordination normal.     Deep Tendon Reflexes: Reflexes are normal and symmetric. Reflexes normal.  Psychiatric:        Behavior: Behavior normal.        Thought Content: Thought content normal.  Judgment: Judgment normal.           Assessment & Plan:  She had recent injuries to the left wrist and left knee, and she will follow up with Orthopedics as above. She will see Dr. Aldona Amel for the diabetes. She will see Dr. Lavonne Prairie for the dilated cardiomyopathy. Her HTN is stable. We wil get fasting labs to check lipids, renal function, etc. We spent a total of ( 35  ) minutes reviewing records and discussing these issues.  Corita Diego, MD

## 2024-03-07 ENCOUNTER — Encounter: Payer: Self-pay | Admitting: Cardiology

## 2024-03-07 ENCOUNTER — Ambulatory Visit: Attending: Cardiology | Admitting: Cardiology

## 2024-03-07 ENCOUNTER — Ambulatory Visit: Payer: Self-pay | Admitting: Family Medicine

## 2024-03-07 VITALS — BP 120/70 | HR 72 | Ht 63.0 in | Wt 229.0 lb

## 2024-03-07 DIAGNOSIS — I420 Dilated cardiomyopathy: Secondary | ICD-10-CM

## 2024-03-07 NOTE — Patient Instructions (Signed)
Medication Instructions:  No changes  *If you need a refill on your cardiac medications before your next appointment, please call your pharmacy*   Lab Work: Not needed    Testing/Procedures:  Not needed  Follow-Up: At [facility], you and your health needs are our priority.  As part of our continuing mission to provide you with exceptional heart care, we have created designated Provider Care Teams.  These Care Teams include your primary Cardiologist (physician) and Advanced Practice Providers (APPs -  Physician Assistants and Nurse Practitioners) who all work together to provide you with the care you need, when you need it.     Your next appointment:   As needed  The format for your next appointment:   In Person  Provider:   James Hochrein, MD    

## 2024-03-07 NOTE — Addendum Note (Signed)
 Addended by: Corita Diego A on: 03/07/2024 08:16 AM   Modules accepted: Orders

## 2024-03-21 ENCOUNTER — Encounter: Payer: Self-pay | Admitting: Family Medicine

## 2024-03-21 NOTE — Telephone Encounter (Signed)
 I have no idea where this came from. Cerebral palsy is a condition people are born with where parts of the brain do not work properly. She does NOT have this. I wonder if someone dictated her summary at [facility] and the system made a dictation mistake. Anyway this is NOT in her regular Epic chart so I don't think she should worry about it

## 2024-04-10 ENCOUNTER — Other Ambulatory Visit: Payer: Self-pay | Admitting: Nephrology

## 2024-04-10 DIAGNOSIS — N1832 Chronic kidney disease, stage 3b: Secondary | ICD-10-CM

## 2024-04-11 ENCOUNTER — Ambulatory Visit
Admission: RE | Admit: 2024-04-11 | Discharge: 2024-04-11 | Disposition: A | Discharge status: Home / Self Care | Source: Ambulatory Visit | Attending: Nephrology | Admitting: Nephrology

## 2024-04-11 DIAGNOSIS — N1832 Chronic kidney disease, stage 3b: Secondary | ICD-10-CM

## 2024-04-29 ENCOUNTER — Other Ambulatory Visit: Payer: Self-pay | Admitting: Cardiology

## 2024-05-01 ENCOUNTER — Telehealth: Payer: Self-pay

## 2024-05-01 NOTE — Telephone Encounter (Signed)
 Pt was at the office on 04/30/24 with her son. Copied from CRM [id number]. Topic: General - Call Back - No Documentation >> Apr 26, 2024 11:23 AM Berneda FALCON wrote: Reason for CRM: Patient states she received a call from Dr. Mira nurse. I do not see any notes in there and states she would like to have the nurse call her back please. There is no specified reason.  Patient callback is [phone]

## 2024-05-03 ENCOUNTER — Telehealth: Payer: Self-pay | Admitting: Cardiology

## 2024-05-03 MED ORDER — SACUBITRIL-VALSARTAN 97-103 MG PO TABS: 1.0000 | ORAL_TABLET | Freq: Two times a day (BID) | ORAL | 0 refills | Status: DC

## 2024-05-03 NOTE — Telephone Encounter (Signed)
*  STAT* If patient is at the pharmacy, call can be transferred to refill team.   1. Which medications need to be refilled? (please list name of each medication and dose if known) need a new prescription for Entresto  to local pharmacy until her mail order gets here   2. Would you like to learn more about the convenience, safety, & potential cost savings by using the [facility]?      3. Are you open to using the [facility] (Type [facility].    4. Which pharmacy/location (including street and city if local pharmacy) is medication to be sent to?[facility] [place] [place]   5. Do they need a 30 day or 90 day supply? 10 days #20- please call in today- out of medicine

## 2024-05-03 NOTE — Telephone Encounter (Signed)
 RX sent to requested Pharmacy

## 2024-05-06 ENCOUNTER — Other Ambulatory Visit: Payer: Self-pay

## 2024-05-06 MED ORDER — HYDRALAZINE HCL 100 MG PO TABS: 100.0000 mg | ORAL_TABLET | Freq: Three times a day (TID) | ORAL | 2 refills | Status: DC

## 2024-05-08 ENCOUNTER — Other Ambulatory Visit: Payer: Self-pay | Admitting: Cardiology

## 2024-05-08 ENCOUNTER — Telehealth: Payer: Self-pay

## 2024-05-08 MED ORDER — ISOSORBIDE MONONITRATE ER 120 MG PO TB24: 120.0000 mg | ORAL_TABLET | Freq: Every day | ORAL | 0 refills | Status: DC

## 2024-05-08 NOTE — Telephone Encounter (Signed)
 Spoke to pt and she stated that the Cardiologist informed her that all of her medications need to come from PCP. Pt articulated that per Cardiologist her heart was fine and did not need to f/u with him anymore. Pt requesting for all medications that cardiologist filled be covered by Dr.Fry now and she needs a refill.

## 2024-05-09 MED ORDER — SACUBITRIL-VALSARTAN 97-103 MG PO TABS: 1.0000 | ORAL_TABLET | Freq: Two times a day (BID) | ORAL | 3 refills | Status: DC

## 2024-05-09 MED ORDER — METOPROLOL SUCCINATE ER 100 MG PO TB24: ORAL_TABLET | ORAL | 3 refills | Status: AC

## 2024-05-09 MED ORDER — HYDRALAZINE HCL 100 MG PO TABS: 100.0000 mg | ORAL_TABLET | Freq: Three times a day (TID) | ORAL | 3 refills | Status: AC

## 2024-05-09 MED ORDER — ISOSORBIDE MONONITRATE ER 120 MG PO TB24: 120.0000 mg | ORAL_TABLET | Freq: Every day | ORAL | 3 refills | Status: DC

## 2024-05-09 NOTE — Telephone Encounter (Signed)
 Left detailed message for pt advised to call the office back regarding this encounter

## 2024-05-09 NOTE — Telephone Encounter (Signed)
 Spoke with pt voiced understanding of Dr Johnny advise

## 2024-05-09 NOTE — Telephone Encounter (Signed)
 I sent in refills for the four medications that Dr. Lavona has been giving her

## 2024-05-10 ENCOUNTER — Other Ambulatory Visit: Payer: Self-pay | Admitting: Family Medicine

## 2024-05-10 MED ORDER — BUMETANIDE 1 MG PO TABS: 2.0000 mg | ORAL_TABLET | Freq: Every day | ORAL | 3 refills | Status: AC

## 2024-05-10 NOTE — Telephone Encounter (Signed)
 Copied from CRM [id number]. Topic: Clinical - Medication Refill >> May 10, 2024  9:15 AM Thersia C wrote: Medication: bumetanide  (BUMEX ) 1 MG tablet - patient wanted to know if [facility] [address] While she wait for the full [facility] to [facility]   Has the patient contacted their pharmacy? Yes (Agent: If no, request that the patient contact the pharmacy for the refill. If patient does not wish to contact the pharmacy document the reason why and proceed with request.) (Agent: If yes, when and what did the pharmacy advise?)  This is the patient's preferred pharmacy:  [id number] [place], [place] - [address] [place] [address] Phone: [phone] Fax: [phone]  [facility] - [place], [place] - [address] [facility] [address] AT [address] to [facility] [address] [place] [address] Phone: [phone] Fax: [phone]  Is this the correct pharmacy for this prescription? Yes If no, delete pharmacy and type the correct one.   Has the prescription been filled recently? No  Is the patient out of the medication? Yes  Has the patient been seen for an appointment in the last year OR does the patient have an upcoming appointment? Yes  Can we respond through MyChart? Yes  Agent: Please be advised that Rx refills may take up to 3 business days. We ask that you follow-up with your pharmacy.

## 2024-05-22 ENCOUNTER — Telehealth: Payer: Self-pay

## 2024-05-22 NOTE — Telephone Encounter (Signed)
 Spoke with pt advised that Flu vaccines are not available until fall pt stated that she will wait to get it from our office, pt will get COVID vaccine from her pharmacy Copied from CRM [id number]. Topic: Clinical - Medical Advice >> May 17, 2024 10:33 AM Franky GRADE wrote: Reason for CRM: Patient is call to see which vaccine her and her husband should get first the covid or flu vaccine and how far apart should she get them.

## 2024-05-23 ENCOUNTER — Encounter: Payer: Self-pay | Admitting: Family Medicine

## 2024-05-23 ENCOUNTER — Telehealth: Payer: Self-pay

## 2024-05-23 DIAGNOSIS — D509 Iron deficiency anemia, unspecified: Secondary | ICD-10-CM

## 2024-05-23 NOTE — Telephone Encounter (Signed)
 Copied from CRM [id number]. Topic: Clinical - Medication Question >> May 22, 2024  1:56 PM Robinson H wrote: Reason for CRM: Patient calling to notify provider that per her kidney doctor the ferrous sulfate isn't bringing the anemia up and needs to take something different. Patient states right now she's taking an over the counter ferrous sulfate.  Maurie [phone]

## 2024-05-23 NOTE — Telephone Encounter (Signed)
 I have referred her to Hematology for this. I think she is a good candidate for iron  infusions.

## 2024-05-23 NOTE — Telephone Encounter (Signed)
Notified pt of the referral

## 2024-05-23 NOTE — Telephone Encounter (Signed)
 Already explained Dr Johnny recommendation for referral to Hematology for this, pt voiced understanding

## 2024-05-23 NOTE — Addendum Note (Signed)
 Addended by: JOHNNY SENIOR A on: 05/23/2024 01:04 PM   Modules accepted: Orders

## 2024-05-24 ENCOUNTER — Telehealth (HOSPITAL_COMMUNITY): Payer: Self-pay | Admitting: Pharmacy Technician

## 2024-05-24 NOTE — Telephone Encounter (Signed)
 Auth Submission: NO AUTH NEEDED Site of care: [facility] Payer: [employer] Medicare Medication & CPT/J Code(s) submitted: Venofer (Iron  Sucrose) J1756 Diagnosis Code: N18.2, D63.1 Route of submission (phone, fax, portal):  Phone # Fax # Auth type: Buy/Bill HB Units/visits requested: 200mg  x 5 doses Reference number: [id number] Approval from: 05/24/24 to 10/09/24      Dagoberto Armour, CPhT [facility] Phone: [phone] 05/24/2024

## 2024-05-31 ENCOUNTER — Other Ambulatory Visit (HOSPITAL_COMMUNITY): Payer: Self-pay | Admitting: *Deleted

## 2024-06-03 ENCOUNTER — Encounter (HOSPITAL_COMMUNITY)
Admission: RE | Admit: 2024-06-03 | Discharge: 2024-06-03 | Disposition: A | Discharge status: Home / Self Care | Source: Ambulatory Visit | Attending: Nephrology | Admitting: Nephrology

## 2024-06-03 DIAGNOSIS — D509 Iron deficiency anemia, unspecified: Secondary | ICD-10-CM | POA: Diagnosis present

## 2024-06-03 MED ORDER — IRON SUCROSE 200 MG IVPB - SIMPLE MED
200.0000 mg | Status: DC
  Administered 2024-06-03: 200 mg via INTRAVENOUS
  Filled 2024-06-03: qty 200

## 2024-06-05 ENCOUNTER — Encounter (HOSPITAL_COMMUNITY)
Admission: RE | Admit: 2024-06-05 | Discharge: 2024-06-05 | Disposition: A | Discharge status: Home / Self Care | Source: Ambulatory Visit | Attending: Nephrology

## 2024-06-05 DIAGNOSIS — D509 Iron deficiency anemia, unspecified: Secondary | ICD-10-CM | POA: Diagnosis not present

## 2024-06-05 MED ORDER — IRON SUCROSE 200 MG IVPB - SIMPLE MED
200.0000 mg | Status: DC
  Administered 2024-06-05: 200 mg via INTRAVENOUS
  Filled 2024-06-05: qty 200

## 2024-06-07 ENCOUNTER — Encounter (HOSPITAL_COMMUNITY)
Admission: RE | Admit: 2024-06-07 | Discharge: 2024-06-07 | Disposition: A | Discharge status: Home / Self Care | Source: Ambulatory Visit | Attending: Nephrology | Admitting: Nephrology

## 2024-06-07 DIAGNOSIS — D509 Iron deficiency anemia, unspecified: Secondary | ICD-10-CM | POA: Diagnosis not present

## 2024-06-07 MED ORDER — IRON SUCROSE 200 MG IVPB - SIMPLE MED
200.0000 mg | Status: DC
  Administered 2024-06-07: 200 mg via INTRAVENOUS
  Filled 2024-06-07: qty 200

## 2024-06-11 ENCOUNTER — Encounter (HOSPITAL_COMMUNITY)
Admission: RE | Admit: 2024-06-11 | Discharge: 2024-06-11 | Disposition: A | Discharge status: Home / Self Care | Source: Ambulatory Visit | Attending: Nephrology | Admitting: Nephrology

## 2024-06-11 DIAGNOSIS — D509 Iron deficiency anemia, unspecified: Secondary | ICD-10-CM | POA: Diagnosis present

## 2024-06-11 MED ORDER — IRON SUCROSE 200 MG IVPB - SIMPLE MED
200.0000 mg | Status: DC
  Administered 2024-06-11: 200 mg via INTRAVENOUS
  Filled 2024-06-11: qty 200

## 2024-06-13 ENCOUNTER — Encounter (HOSPITAL_COMMUNITY)
Admission: RE | Admit: 2024-06-13 | Discharge: 2024-06-13 | Disposition: A | Discharge status: Home / Self Care | Source: Ambulatory Visit | Attending: Nephrology | Admitting: Nephrology

## 2024-06-13 DIAGNOSIS — D509 Iron deficiency anemia, unspecified: Secondary | ICD-10-CM | POA: Diagnosis not present

## 2024-06-13 MED ORDER — IRON SUCROSE 200 MG IVPB - SIMPLE MED
200.0000 mg | Status: DC
  Administered 2024-06-13: 200 mg via INTRAVENOUS
  Filled 2024-06-13: qty 200

## 2024-07-03 ENCOUNTER — Ambulatory Visit (INDEPENDENT_AMBULATORY_CARE_PROVIDER_SITE_OTHER)

## 2024-07-03 DIAGNOSIS — Z23 Encounter for immunization: Secondary | ICD-10-CM

## 2024-07-08 ENCOUNTER — Inpatient Hospital Stay

## 2024-07-08 ENCOUNTER — Encounter: Payer: Self-pay | Admitting: Oncology

## 2024-07-08 ENCOUNTER — Telehealth: Payer: Self-pay | Admitting: Oncology

## 2024-07-08 ENCOUNTER — Inpatient Hospital Stay: Attending: Oncology | Admitting: Oncology

## 2024-07-08 VITALS — BP 161/65 | HR 68 | Temp 98.5°F | Resp 18 | Ht 63.0 in | Wt 230.0 lb

## 2024-07-08 DIAGNOSIS — D649 Anemia, unspecified: Secondary | ICD-10-CM | POA: Diagnosis present

## 2024-07-08 DIAGNOSIS — Z800 Family history of malignant neoplasm of digestive organs: Secondary | ICD-10-CM | POA: Insufficient documentation

## 2024-07-08 DIAGNOSIS — N182 Chronic kidney disease, stage 2 (mild): Secondary | ICD-10-CM | POA: Insufficient documentation

## 2024-07-08 DIAGNOSIS — D509 Iron deficiency anemia, unspecified: Secondary | ICD-10-CM

## 2024-07-08 DIAGNOSIS — E1122 Type 2 diabetes mellitus with diabetic chronic kidney disease: Secondary | ICD-10-CM | POA: Diagnosis not present

## 2024-07-08 LAB — CBC WITH DIFFERENTIAL (CANCER CENTER ONLY)
Abs Immature Granulocytes: 0.02 K/uL (ref 0.00–0.07)
Basophils Absolute: 0 K/uL (ref 0.0–0.1)
Basophils Relative: 1 %
Eosinophils Absolute: 0.1 K/uL (ref 0.0–0.5)
Eosinophils Relative: 1 %
HCT: 33.5 % — ABNORMAL LOW (ref 36.0–46.0)
Hemoglobin: 10.2 g/dL — ABNORMAL LOW (ref 12.0–15.0)
Immature Granulocytes: 0 %
Lymphocytes Relative: 28 %
Lymphs Abs: 1.8 K/uL (ref 0.7–4.0)
MCH: 26.4 pg (ref 26.0–34.0)
MCHC: 30.4 g/dL (ref 30.0–36.0)
MCV: 86.6 fL (ref 80.0–100.0)
Monocytes Absolute: 0.3 K/uL (ref 0.1–1.0)
Monocytes Relative: 5 %
Neutro Abs: 4.1 K/uL (ref 1.7–7.7)
Neutrophils Relative %: 65 %
Platelet Count: 232 K/uL (ref 150–400)
RBC: 3.87 MIL/uL (ref 3.87–5.11)
RDW: 14.6 % (ref 11.5–15.5)
WBC Count: 6.4 K/uL (ref 4.0–10.5)
nRBC: 0 % (ref 0.0–0.2)

## 2024-07-08 LAB — CMP (CANCER CENTER ONLY)
ALT: 9 U/L (ref 0–44)
AST: 20 U/L (ref 15–41)
Albumin: 4.3 g/dL (ref 3.5–5.0)
Alkaline Phosphatase: 72 U/L (ref 38–126)
Anion gap: 12 (ref 5–15)
BUN: 29 mg/dL — ABNORMAL HIGH (ref 8–23)
CO2: 21 mmol/L — ABNORMAL LOW (ref 22–32)
Calcium: 9.8 mg/dL (ref 8.9–10.3)
Chloride: 108 mmol/L (ref 98–111)
Creatinine: 1.22 mg/dL — ABNORMAL HIGH (ref 0.44–1.00)
GFR, Estimated: 46 mL/min — ABNORMAL LOW (ref >60)
Glucose, Bld: 99 mg/dL (ref 70–99)
Potassium: 4.6 mmol/L (ref 3.5–5.1)
Sodium: 141 mmol/L (ref 135–145)
Total Bilirubin: 0.3 mg/dL (ref 0.0–1.2)
Total Protein: 7.7 g/dL (ref 6.5–8.1)

## 2024-07-08 LAB — LACTATE DEHYDROGENASE: LDH: 203 U/L — ABNORMAL HIGH (ref 98–192)

## 2024-07-08 LAB — VITAMIN B12: Vitamin B-12: <150 pg/mL — ABNORMAL LOW (ref 180–914)

## 2024-07-08 LAB — FERRITIN: Ferritin: 527 ng/mL — ABNORMAL HIGH (ref 11–307)

## 2024-07-08 LAB — IRON AND TIBC
Iron: 44 ug/dL (ref 28–170)
Saturation Ratios: 18 % (ref 10.4–31.8)
TIBC: 249 ug/dL — ABNORMAL LOW (ref 250–450)
UIBC: 205 ug/dL

## 2024-07-08 LAB — FOLATE: Folate: 13.2 ng/mL (ref >5.9)

## 2024-07-08 NOTE — Progress Notes (Signed)
 [facility]  HEMATOLOGY CLINIC CONSULTATION NOTE   PATIENT NAME: Krista Benjamin   MR#: [id number] DOB: [birth year]  DATE OF SERVICE: 07/08/2024  Patient Care Team: Krista Garnette LABOR, MD as PCP - General Krista Agent, MD as PCP - Cardiology (Cardiology) Krista Cough, MD as Consulting Physician (Orthopedic Surgery)  REASON FOR CONSULTATION/ CHIEF COMPLAINT:  Evaluation of anemia.  ASSESSMENT & PLAN:   Krista Benjamin is a 73 y.o. lady with a past medical history of hypertension, dyslipidemia, type 2 diabetes mellitus, CKD stage 3, COPD, TIA, was referred to our service for evaluation of normocytic anemia.    Normocytic anemia Patient has had chronic anemia at least since 2020.  Recently she was found to have iron  deficiency and anemia with hemoglobin of 10 in May 2025.  She has been on oral iron  supplements once daily and tolerating it well.  She recently received 5 doses of IV Venofer  for iron  deficiency, as arranged by her PCP.  Given persistent anemia, referral was sent to us  for further evaluation.  No evidence of active bleeding or blood loss.   Differential diagnosis includes low iron  levels, vitamin B12 deficiency, folic acid deficiency, and potential CKD-related anemia due to decreased erythropoietin production. Recent IV iron  infusions completed at [facility] No further infusions needed at this time iron  deficiency persists.  Labs today showed stable hemoglobin of 10.2, MCV now normal at 86.6.  White count and platelet count are within normal limits.  Ferritin 527.  Creatinine 1.22, calcium  normal at 9.8.  LDH close to normal at 203.  We pursued additional workup for anemia including haptoglobin, B12, folic acid.  Given CKD and anemia, we will rule out underlying monoclonal gammopathy, although clinically this seems to be less likely.  Hence today we will check SPEP, quantitative immunoglobulins, serum free light chains.  It is  likely that she has anemia of multifactorial etiology, from iron  deficiency and also CKD related. Consider erythropoietin-stimulating Benjamin if hemoglobin consistently falls below 9.  I will call her in 2 weeks to discuss results of remaining workup.  - Plan follow-up appointment in four months to reassess anemia status and repeat necessary blood tests.  CKD stage 2 due to type 2 diabetes mellitus (HCC) Chronic kidney disease stage 2 to 3, possibly contributing to anemia due to decreased erythropoietin production. She is under the care of nephrologist Dr. Dennise. Erythropoietin-stimulating agents are not indicated unless hemoglobin is consistently below 9. - Monitor kidney function as part of routine follow-up.   I reviewed lab results and outside records for this visit and discussed relevant results with the patient. Diagnosis, plan of care and treatment options were also discussed in detail with the patient. Opportunity provided to ask questions and answers provided to her apparent satisfaction. Provided instructions to call our clinic with any problems, questions or concerns prior to return visit. I recommended to continue follow-up with PCP and sub-specialists. She verbalized understanding and agreed with the plan. No barriers to learning was detected.  Krista Barletta, MD [facility] A DEPT OF [facility] [address] [place] [address] Dept: [phone] Dept Fax: [phone]  07/08/2024 1:05 PM  HISTORY OF PRESENT ILLNESS:  Discussed the use of AI scribe software for clinical note transcription with the patient, who gave verbal consent to proceed.  History of Present Illness Krista Benjamin is a 73 year old female with anemia who presents for evaluation of her  anemia management. She was referred by Dr. Johnny for evaluation of her anemia.  Labs at her PCPs office on 03/06/2024 showed hemoglobin of 10,  hematocrit 31, MCV 77.2.  White count and platelet count were within normal limits.  Creatinine was 1.65, BUN 46.  TSH was normal at 1.  She was referred to us  for further evaluation of chronic anemia.  She has a long-standing history of anemia and has been taking ferrous sulfate  once daily, which has not been effective. She recently completed five intravenous iron  treatments at [facility] within the last month. No signs of blood loss such as blood in stools, black stools, epistaxis, or gum bleeds. A colonoscopy performed last year showed diverticulosis but no polyps.  Her hemoglobin level was recorded at 10 g/dL in May, which is below the normal range for women. Her white blood cell and platelet counts were normal, and her red blood cells were noted to be small. She has not had any lab work in the past four months.  She mentions taking an unspecified medication for her kidneys, prescribed by Dr. Dennise, but cannot recall the name. She was informed that a previous yeast infection might have affected her kidney function tests. She has been seeing Dr. Dennise for her kidney issues.  Her last colonoscopy was in April 2024 which showed evidence of diverticulosis.  No polyps or other concerning findings.   MEDICAL HISTORY:  Past Medical History:  Diagnosis Date   Anemia    Aortic stenosis    Mild   Arthritis    Cataract    both eyes  (cataracts removed)   COPD (chronic obstructive pulmonary disease) (HCC)    Diabetes mellitus    sees Dr. Trixie    Gynecological examination    sees Dr. Rosalynn    Hyperlipidemia    Hypertension    sees Dr. Ethan Benjamin   Stroke [facility])    TIA    SURGICAL HISTORY: Past Surgical History:  Procedure Laterality Date   CARPAL TUNNEL RELEASE  01/22/2014   Left Hand    CATARACT EXTRACTION     Lens surgery   COLONOSCOPY  02/02/2023   per Dr. Legrand, no polyps, no repeats needed   excision of benign cyst     from left maxilla and left maxillary sinus    KNEE SURGERY     RIGHT/LEFT HEART CATH AND CORONARY ANGIOGRAPHY N/A 09/14/2018   Procedure: RIGHT/LEFT HEART CATH AND CORONARY ANGIOGRAPHY;  Surgeon: Swazil[place], Peter M, MD;  Location: [facility] INVASIVE CV LAB;  Service: Cardiovascular;  Laterality: N/A;   SHOULDER ARTHROSCOPY W/ ROTATOR CUFF REPAIR Left 09/23/2013   per Dr. Lorenso Hint at [facility] center in [place]     SOCIAL HISTORY: She reports that she has never smoked. She has never used smokeless tobacco. She reports that she does not drink alcohol  and does not use drugs. Social History   Socioeconomic History   Marital status: Married    Spouse name: Not on file   Number of children: Not on file   Years of education: Not on file   Highest education level: Not on file  Occupational History   Not on file  Tobacco Use   Smoking status: Never   Smokeless tobacco: Never  Vaping Use   Vaping status: Never Used  Substance and Sexual Activity   Alcohol  use: No    Alcohol /week: 0.0 standard drinks of alcohol    Drug use: No   Sexual activity: Not Currently  Birth control/protection: Post-menopausal  Other Topics Concern   Not on file  Social History Narrative   Not on file   Social Drivers of Health   Financial Resource Strain: Low Risk  (05/02/2022)   Overall Financial Resource Strain (CARDIA)    Difficulty of Paying Living Expenses: Not hard at all  Food Insecurity: No Food Insecurity (07/08/2024)   Hunger Vital Sign    Worried About Running Out of Food in the Last Year: Never true    Ran Out of Food in the Last Year: Never true  Transportation Needs: No Transportation Needs (07/08/2024)   PRAPARE - Administrator, Civil Service (Medical): No    Lack of Transportation (Non-Medical): No  Physical Activity: Inactive (05/02/2022)   Exercise Vital Sign    Days of Exercise per Week: 0 days    Minutes of Exercise per Session: 0 min  Stress: No Stress Concern Present (05/02/2022)   [employer] of  Occupational Health - Occupational Stress Questionnaire    Feeling of Stress : Not at all  Social Connections: Socially Integrated (05/02/2022)   Social Connection and Isolation Panel    Frequency of Communication with Friends and Family: More than three times a week    Frequency of Social Gatherings with Friends and Family: More than three times a week    Attends Religious Services: More than 4 times per year    Active Member of [employer] or Organizations: Yes    Attends Engineer, structural: More than 4 times per year    Marital Status: Married  Catering manager Violence: Not At Risk (07/08/2024)   Humiliation, Afraid, Rape, and Kick questionnaire    Fear of Current or Ex-Partner: No    Emotionally Abused: No    Physically Abused: No    Sexually Abused: No    FAMILY HISTORY: Family History  Problem Relation Age of Onset   Coronary artery disease Mother    Colon cancer Mother    Coronary artery disease Brother    Cancer Other        fhx   Diabetes Other        fhx   Hyperlipidemia Other        fhx   Hypertension Other        fhx   Stroke Other        fhx   Colon polyps Neg Hx    Esophageal cancer Neg Hx    Rectal cancer Neg Hx    Stomach cancer Neg Hx     ALLERGIES:  She is allergic to bee venom.  MEDICATIONS:  Current Outpatient Medications  Medication Sig Dispense Refill   albuterol  (PROVENTIL ) (2.5 MG/3ML) 0.083% nebulizer solution Take 3 mLs (2.5 mg total) by nebulization every 6 (six) hours as needed for wheezing or shortness of breath. 75 mL 12   albuterol  (VENTOLIN  HFA) 108 (90 Base) MCG/ACT inhaler Inhale 1-2 puffs into the lungs every 4 (four) hours as needed for wheezing or shortness of breath. 1 each 11   Alcohol  Swabs  (ALCOHOL  PREP) 70 % PADS USE TO CHECK BLOOD SUGAR ONCE DAILY 100 each 3   aspirin  EC 81 MG tablet Take 81 mg by mouth at bedtime. Swallow whole.     atorvastatin  (LIPITOR) 40 MG tablet Take 1 tablet (40 mg total) by mouth at bedtime. 90  tablet 3   Blood Glucose Monitoring Suppl (TRUE METRIX GO GLUCOSE METER) w/Device KIT Use as instructed to check blood sugar 2 times daily  1 kit 0   bumetanide  (BUMEX ) 1 MG tablet Take 2 tablets (2 mg total) by mouth at bedtime. 180 tablet 3   cholecalciferol (VITAMIN D) 1000 UNITS tablet Take 2,000 Units by mouth at bedtime.     EPIPEN  2-PAK 0.3 MG/0.3ML SOAJ injection INJECT 0.3 MLS (0.3 MG TOTAL) INTO THE MUSCLE ONCE. (Patient taking differently: Inject 0.3 mg into the muscle as needed for anaphylaxis.) 1 Device 0   Fexofenadine HCl (ALLEGRA PO) Take 1 tablet by mouth daily at 12 noon.     glimepiride  (AMARYL ) 1 MG tablet Take 1 tablet (1 mg total) by mouth daily with breakfast. 90 tablet 3   glucose blood (TRUE METRIX BLOOD GLUCOSE TEST) test strip USE AS INSTRUCTED TO CHECK BLOOD SUGAR TWICE DAILY E11.65 200 strip 3   hydrALAZINE  (APRESOLINE ) 100 MG tablet Take 1 tablet (100 mg total) by mouth 3 (three) times daily. 270 tablet 3   Iron , Ferrous Sulfate , 325 (65 Fe) MG TABS Take 1 tablet by mouth daily.     isosorbide  mononitrate (IMDUR ) 120 MG 24 hr tablet Take 1 tablet (120 mg total) by mouth daily. 90 tablet 3   ketoconazole  (NIZORAL ) 2 % cream Apply 1 Application topically 2 (two) times daily as needed (yeast infection). 60 g 5   metFORMIN  (GLUCOPHAGE -XR) 500 MG 24 hr tablet TAKE 2 TABLETS TWICE A DAY WITH MEALS (Patient taking differently: Take 1,000 mg by mouth in the morning and at bedtime. TAKE 2 TABLETS TWICE A DAY WITH MEALS) 360 tablet 3   metoprolol  succinate (TOPROL -XL) 100 MG 24 hr tablet TAKE 1 TABLET BY MOUTH DAILY. TAKE WITH OR IMMEDIATELY FOLLOWING A MEAL. 90 tablet 3   omeprazole  (PRILOSEC) 40 MG capsule Take 1 capsule (40 mg total) by mouth daily. 90 capsule 3   polyvinyl alcohol  (LIQUIFILM TEARS) 1.4 % ophthalmic solution Place 1 drop into both eyes daily as needed for dry eyes.     sacubitril -valsartan  (ENTRESTO ) 97-103 MG Take 1 tablet by mouth 2 (two) times daily. 180  tablet 3   sitaGLIPtin  (JANUVIA ) 100 MG tablet Take 1 tablet (100 mg total) by mouth daily. 90 tablet 3   triamcinolone  cream (KENALOG ) 0.1 % Apply 1 Application topically 2 (two) times daily as needed (eczema). 453.6 g 5   TRUEplus Lancets 33G MISC USE AS INSTRUCTED TO CHECK BLOOD SUGAR 2 TIMES DAILY 200 each 3   No current facility-administered medications for this visit.    REVIEW OF SYSTEMS:    Review of Systems - Oncology  All other pertinent systems were reviewed and were negative except as mentioned above.  PHYSICAL EXAMINATION:   Onc Performance Status - 07/08/24 1006       ECOG Perf Status   ECOG Perf Status Capable of only limited selfcare, confined to bed or chair more than 50% of waking hours      KPS SCALE   KPS % SCORE Requires occasional assistance but is able to care for most needs          Vitals:   07/08/24 1000  BP: (!) 161/65  Pulse: 68  Resp: 18  Temp: 98.5 F (36.9 C)  SpO2: 100%   Filed Weights   07/08/24 1000  Weight: 230 lb (104.3 kg)    Physical Exam Constitutional:      General: She is not in acute distress.    Appearance: Normal appearance.  HENT:     Head: Normocephalic and atraumatic.  Cardiovascular:     Rate and Rhythm:  Normal rate.  Pulmonary:     Effort: Pulmonary effort is normal. No respiratory distress.  Abdominal:     General: There is no distension.  Neurological:     General: No focal deficit present.     Mental Status: She is alert and oriented to person, place, and time.  Psychiatric:        Mood and Affect: Mood normal.        Behavior: Behavior normal.      LABORATORY DATA:   I have reviewed the data as listed.  Results for orders placed or performed in visit on 07/08/24  Lactate dehydrogenase  Result Value Ref Range   LDH 203 (H) 98 - 192 U/L  Ferritin  Result Value Ref Range   Ferritin 527 (H) 11 - 307 ng/mL  CMP (Cancer Center only)  Result Value Ref Range   Sodium 141 135 - 145 mmol/L    Potassium 4.6 3.5 - 5.1 mmol/L   Chloride 108 98 - 111 mmol/L   CO2 21 (L) 22 - 32 mmol/L   Glucose, Bld 99 70 - 99 mg/dL   BUN 29 (H) 8 - 23 mg/dL   Creatinine 8.77 (H) 9.55 - 1.00 mg/dL   Calcium  9.8 8.9 - 10.3 mg/dL   Total Protein 7.7 6.5 - 8.1 g/dL   Albumin 4.3 3.5 - 5.0 g/dL   AST 20 15 - 41 U/L   ALT 9 0 - 44 U/L   Alkaline Phosphatase 72 38 - 126 U/L   Total Bilirubin 0.3 0.0 - 1.2 mg/dL   GFR, Estimated 46 (L) >60 mL/min   Anion gap 12 5 - 15  CBC with Differential (Cancer Center Only)  Result Value Ref Range   WBC Count 6.4 4.0 - 10.5 K/uL   RBC 3.87 3.87 - 5.11 MIL/uL   Hemoglobin 10.2 (L) 12.0 - 15.0 g/dL   HCT 66.4 (L) 63.9 - 53.9 %   MCV 86.6 80.0 - 100.0 fL   MCH 26.4 26.0 - 34.0 pg   MCHC 30.4 30.0 - 36.0 g/dL   RDW 85.3 88.4 - 84.4 %   Platelet Count 232 150 - 400 K/uL   nRBC 0.0 0.0 - 0.2 %   Neutrophils Relative % 65 %   Neutro Abs 4.1 1.7 - 7.7 K/uL   Lymphocytes Relative 28 %   Lymphs Abs 1.8 0.7 - 4.0 K/uL   Monocytes Relative 5 %   Monocytes Absolute 0.3 0.1 - 1.0 K/uL   Eosinophils Relative 1 %   Eosinophils Absolute 0.1 0.0 - 0.5 K/uL   Basophils Relative 1 %   Basophils Absolute 0.0 0.0 - 0.1 K/uL   Immature Granulocytes 0 %   Abs Immature Granulocytes 0.02 0.00 - 0.07 K/uL     RADIOGRAPHIC STUDIES:  No recent pertinent imaging studies available to review.  Orders Placed This Encounter  Procedures   CBC with Differential (Cancer Center Only)    Standing Status:   Future    Number of Occurrences:   1    Expiration Date:   07/08/2025   CMP (Cancer Center only)    Standing Status:   Future    Number of Occurrences:   1    Expiration Date:   07/08/2025   Iron  and TIBC    Standing Status:   Future    Number of Occurrences:   1    Expiration Date:   07/08/2025   Ferritin    Standing Status:   Future  Number of Occurrences:   1    Expiration Date:   07/08/2025   Vitamin B12    Standing Status:   Future    Number of Occurrences:   1     Expiration Date:   07/08/2025   Folate    Standing Status:   Future    Number of Occurrences:   1    Expiration Date:   07/08/2025   Lactate dehydrogenase    Standing Status:   Future    Number of Occurrences:   1    Expiration Date:   07/08/2025   Haptoglobin    Standing Status:   Future    Number of Occurrences:   1    Expiration Date:   07/08/2025   Kappa/lambda light chains    Standing Status:   Future    Number of Occurrences:   1    Expiration Date:   07/08/2025   Multiple Myeloma Panel (SPEP&IFE w/QIG)    Standing Status:   Future    Number of Occurrences:   1    Expiration Date:   07/08/2025    Future Appointments  Date Time Provider Department Center  07/22/2024  3:15 PM Autumn Millman, MD [facility] None  07/30/2024  8:40 AM Krista Benjamin File, MD [facility] None  11/04/2024  9:30 AM [facility] PHLEBOTOMIST [facility] None  11/04/2024  9:45 AM Dylin Breeden, Millman, MD [facility] None     I spent a total of 55 minutes during this encounter with the patient including review of chart and various tests results, discussions about plan of care and coordination of care plan.  This document was completed utilizing speech recognition software. Grammatical errors, random word insertions, pronoun errors, and incomplete sentences are an occasional consequence of this system due to software limitations, ambient noise, and hardware issues. Any formal questions or concerns about the content, text or information contained within the body of this dictation should be directly addressed to the provider for clarification.

## 2024-07-08 NOTE — Assessment & Plan Note (Signed)
 Patient has had chronic anemia at least since 2020.  Recently she was found to have iron  deficiency and anemia with hemoglobin of 10 in May 2025.  She has been on oral iron  supplements once daily and tolerating it well.  She recently received 5 doses of IV Venofer  for iron  deficiency, as arranged by her PCP.  Given persistent anemia, referral was sent to us  for further evaluation.  No evidence of active bleeding or blood loss.   Differential diagnosis includes low iron  levels, vitamin B12 deficiency, folic acid deficiency, and potential CKD-related anemia due to decreased erythropoietin production. Recent IV iron  infusions completed at [facility]. No further infusions needed at this time iron  deficiency persists.  Labs today showed stable hemoglobin of 10.2, MCV now normal at 86.6.  White count and platelet count are within normal limits.  Ferritin 527.  Creatinine 1.22, calcium  normal at 9.8.  LDH close to normal at 203.  We pursued additional workup for anemia including haptoglobin, B12, folic acid.  Given CKD and anemia, we will rule out underlying monoclonal gammopathy, although clinically this seems to be less likely.  Hence today we will check SPEP, quantitative immunoglobulins, serum free light chains.  It is likely that she has anemia of multifactorial etiology, from iron  deficiency and also CKD related. Consider erythropoietin-stimulating agent if hemoglobin consistently falls below 9.  I will call her in 2 weeks to discuss results of remaining workup.  - Plan follow-up appointment in four months to reassess anemia status and repeat necessary blood tests.

## 2024-07-08 NOTE — Telephone Encounter (Signed)
 PT called to get directions.

## 2024-07-08 NOTE — Assessment & Plan Note (Signed)
 Chronic kidney disease stage 2 to 3, possibly contributing to anemia due to decreased erythropoietin production. She is under the care of nephrologist Dr. Dennise. Erythropoietin-stimulating agents are not indicated unless hemoglobin is consistently below 9. - Monitor kidney function as part of routine follow-up.

## 2024-07-09 LAB — HAPTOGLOBIN: Haptoglobin: 186 mg/dL (ref 42–346)

## 2024-07-09 LAB — KAPPA/LAMBDA LIGHT CHAINS
Kappa free light chain: 43 mg/L — ABNORMAL HIGH (ref 3.3–19.4)
Kappa, lambda light chain ratio: 2.12 — ABNORMAL HIGH (ref 0.26–1.65)
Lambda free light chains: 20.3 mg/L (ref 5.7–26.3)

## 2024-07-11 LAB — MULTIPLE MYELOMA PANEL, SERUM
Albumin SerPl Elph-Mcnc: 3.5 g/dL (ref 2.9–4.4)
Albumin/Glob SerPl: 1.1 (ref 0.7–1.7)
Alpha 1: 0.3 g/dL (ref 0.0–0.4)
Alpha2 Glob SerPl Elph-Mcnc: 0.8 g/dL (ref 0.4–1.0)
B-Globulin SerPl Elph-Mcnc: 1 g/dL (ref 0.7–1.3)
Gamma Glob SerPl Elph-Mcnc: 1.4 g/dL (ref 0.4–1.8)
Globulin, Total: 3.5 g/dL (ref 2.2–3.9)
IgA: 149 mg/dL (ref 64–422)
IgG (Immunoglobin G), Serum: 1312 mg/dL (ref 586–1602)
IgM (Immunoglobulin M), Srm: 104 mg/dL (ref 26–217)
Total Protein ELP: 7 g/dL (ref 6.0–8.5)

## 2024-07-12 ENCOUNTER — Telehealth: Payer: Self-pay

## 2024-07-12 NOTE — Telephone Encounter (Signed)
 Copied from CRM [id number]. Topic: General - Other >> Jul 11, 2024  2:07 PM Rosina BIRCH wrote: Reason for CRM: patient called stating the doctor wrote her a prescription for a medication but it is not entresto  its a substitute for it. Patient does not know if she should take the medicine or not. Patient does not know the name of the medication [phone]

## 2024-07-15 ENCOUNTER — Telehealth: Payer: Self-pay | Admitting: Family Medicine

## 2024-07-15 LAB — HM MAMMOGRAPHY

## 2024-07-15 NOTE — Telephone Encounter (Signed)
 Sacubitril -valsartan  is generic Entresto 

## 2024-07-15 NOTE — Telephone Encounter (Unsigned)
 Copied from CRM [id number]. Topic: Clinical - Medication Question >> Jul 15, 2024 11:14 AM Donee H wrote: Reason for CRM:  Patient called requesting to speak directly to Dr. Johnny or his nurse. She states she is unsure of medication name but Dr. Johnny prescribed a medication that doesn't have entresto . She is states it's a substitute for it. She wants to know if this is the correct medication before she takes it. Please follow back up with patient.

## 2024-07-16 MED ORDER — ENTRESTO 97-103 MG PO TABS: 1.0000 | ORAL_TABLET | Freq: Two times a day (BID) | ORAL | 5 refills | Status: AC

## 2024-07-16 NOTE — Telephone Encounter (Signed)
 I suspect that name brand Entresto  would be very expensive to pay cash for, so I printed out the RX for her. That way she can take it to any pharmacy she chooses

## 2024-07-16 NOTE — Telephone Encounter (Signed)
 Spoke with pt stated that she did not want to change the Entresto  to generic, Dr Johnny notified and a new Rx for Entresto  brand name was faxed to pt pharmacy, pt notified via MyChart

## 2024-07-16 NOTE — Telephone Encounter (Signed)
 Spoke with pt states that she doesn't want to take the generic for Entresto , States to send the Brand Entresto  to her pharmacy, pt is willing to pay out of pocket

## 2024-07-16 NOTE — Addendum Note (Signed)
 Addended by: JOHNNY SENIOR A on: 07/16/2024 11:03 AM   Modules accepted: Orders

## 2024-07-16 NOTE — Telephone Encounter (Signed)
Pt request sent to PCP for advise 

## 2024-07-18 LAB — HM PAP SMEAR

## 2024-07-22 ENCOUNTER — Inpatient Hospital Stay: Attending: Oncology | Admitting: Oncology

## 2024-07-22 ENCOUNTER — Encounter: Payer: Self-pay | Admitting: Oncology

## 2024-07-22 DIAGNOSIS — E1122 Type 2 diabetes mellitus with diabetic chronic kidney disease: Secondary | ICD-10-CM

## 2024-07-22 DIAGNOSIS — E538 Deficiency of other specified B group vitamins: Secondary | ICD-10-CM | POA: Diagnosis not present

## 2024-07-22 DIAGNOSIS — N182 Chronic kidney disease, stage 2 (mild): Secondary | ICD-10-CM | POA: Diagnosis not present

## 2024-07-22 DIAGNOSIS — D649 Anemia, unspecified: Secondary | ICD-10-CM

## 2024-07-22 MED ORDER — VITAMIN B-12 1000 MCG PO TABS: 1000.0000 ug | ORAL_TABLET | Freq: Every day | ORAL | 1 refills | Status: DC

## 2024-07-22 MED ORDER — VITAMIN B-12 1000 MCG PO TABS: 1000.0000 ug | ORAL_TABLET | Freq: Every day | ORAL | 0 refills | Status: DC

## 2024-07-22 NOTE — Assessment & Plan Note (Signed)
 Chronic kidney disease stage 2 to 3, possibly contributing to anemia due to decreased erythropoietin production. She is under the care of nephrologist Dr. Dennise. Erythropoietin-stimulating agents are not indicated unless hemoglobin is consistently below 9.  Improvement in kidney function with creatinine levels decreasing from 1.65 to 1.22 mg/dL. GFR slightly low, but no immediate concern. Emphasis on maintaining adequate hydration to support kidney function. - Encourage adequate hydration to maintain kidney function. - Reassess kidney function at the next appointment in January.

## 2024-07-22 NOTE — Progress Notes (Signed)
 [facility]  HEMATOLOGY-ONCOLOGY ELECTRONIC VISIT PROGRESS NOTE  PATIENT NAME: Krista Benjamin   MR#: [id number] DOB: 31-May-1951  DATE OF SERVICE: 07/22/2024  Patient Care Team: Johnny Garnette LABOR, MD as PCP - General Lavona Agent, MD as PCP - Cardiology (Cardiology) Sissy Cough, MD as Consulting Physician (Orthopedic Surgery)  I connected with the patient via telephone conference and verified that I am speaking with the correct person using two identifiers. The patient's location is at home and I am providing care from the [facility].  I discussed the limitations, risks, security and privacy concerns of performing an evaluation and management service by e-visits and the availability of in person appointments. I also discussed with the patient that there may be a patient responsible charge related to this service. The patient expressed understanding and agreed to proceed.   ASSESSMENT & PLAN:   Krista Benjamin is a 73 y.o.  lady with a past medical history of hypertension, dyslipidemia, type 2 diabetes mellitus, CKD stage 3, COPD, TIA, was referred to our service for evaluation of normocytic anemia.    Normocytic anemia Patient has had chronic anemia at least since 2020.  Recently she was found to have iron  deficiency and anemia with hemoglobin of 10 in May 2025.  She has been on oral iron  supplements once daily and tolerating it well.  She recently received 5 doses of IV Venofer  for iron  deficiency, as arranged by her PCP.  Given persistent anemia, referral was sent to us  for further evaluation.  No evidence of active bleeding or blood loss.   On her consultation with us  on 07/08/2024, labs showed stable hemoglobin of 10.2, MCV now normal at 86.6.  White count and platelet count were within normal limits.  Ferritin 527.  Iron  studies showed no evidence of iron  deficiency.  Creatinine 1.22, calcium  normal at 9.8.  LDH close to normal at 203.   Haptoglobin within normal limits.  Folic acid was also normal.  B12 was undetectable. Given CKD and anemia, we pursued workup to rule out monoclonal gammopathy.  SPEP showed no evidence of M spike.  IFE showed unremarkable pattern.  Serum free kappa was slightly increased at 40 mg/L, lambda was normal at 20.3 mg/L, ratio close to normal at 2.1.  Isolated Elevation could be inflammatory response.   It is likely that she has anemia of multifactorial etiology, from iron  deficiency, vitamin B12 deficiency and also CKD related. Consider erythropoietin-stimulating agent if hemoglobin consistently falls below 9.   She was advised to continue oral iron  supplements.  Started her on vitamin B12 1000 mcg orally daily from 07/22/2024.  If persistent vitamin B12 deficiency is noted, we will pursue workup for pernicious anemia and also start parenteral B12 supplementation.  - Plan follow-up appointment in four months to reassess anemia status and repeat necessary blood tests.  CKD stage 2 due to type 2 diabetes mellitus (HCC) Chronic kidney disease stage 2 to 3, possibly contributing to anemia due to decreased erythropoietin production. She is under the care of nephrologist Dr. Dennise. Erythropoietin-stimulating agents are not indicated unless hemoglobin is consistently below 9.  Improvement in kidney function with creatinine levels decreasing from 1.65 to 1.22 mg/dL. GFR slightly low, but no immediate concern. Emphasis on maintaining adequate hydration to support kidney function. - Encourage adequate hydration to maintain kidney function. - Reassess kidney function at the next appointment in January.  Vitamin B12 deficiency Vitamin B12 was undetectable on her consultation with us  on 07/08/2024.  Started her  on vitamin B12 orally 1000 mcg daily.  We will recheck vitamin B12 levels on return visit.  If persistent deficiency is noted, we will pursue testing for pernicious anemia and proceed with parenteral B12  supplementation.     I discussed the assessment and treatment plan with the patient. The patient was provided an opportunity to ask questions and all were answered. The patient agreed with the plan and demonstrated an understanding of the instructions. The patient was advised to call back or seek an in-person evaluation if the symptoms worsen or if the condition fails to improve as anticipated.    I spent 15 minutes over the phone with the patient reviewing test results, discuss management and coordination/planning of care.  Chinita Patten, MD 07/22/2024 10:12 AM [facility] - A DEPT OF [facility] [address] [place] [address] Dept: [phone] Dept Fax: [phone]   INTERVAL HISTORY:  Please see above for problem oriented charting.  The purpose of today's discussion is to explain recent lab results and to formulate plan of care.  Discussed the use of AI scribe software for clinical note transcription with the patient, who gave verbal consent to proceed.  History of Present Illness Krista Benjamin is a 73 year old female who presents for follow-up of her anemia evaluation. She was referred by Dr.Fry for evaluation of anemia.  She has been experiencing anemia with stable hemoglobin levels around 10.2 g/dL over the past few weeks, confirmed by previous readings from Dr. Aureliano and Dr. Mira offices.  Recent laboratory workup revealed normal iron  levels but a significantly low vitamin B12 level, which was undetectable. She consumes meat products, although less frequently due to a previous stay in a nursing home, which affected her dietary intake. She is no longer residing in the nursing home as of just before Mother's Day.  Her kidney function has shown improvement, with creatinine levels decreasing from 1.65 mg/dL to 8.77 mg/dL. She has increased her water intake.  She has a history of significant  weight loss, from 331 lbs to 229 lbs, which she attributes to a loss of fat. Her thyroid  function was previously checked and found to be normal.  She underwent a colonoscopy in April 2024, which showed no polyps, and her iron  stores are now normal, indicating no evidence of blood loss.    SUMMARY OF HEMATOLOGY HISTORY:  She was referred by Dr. Johnny for evaluation of her anemia.   Labs at her PCPs office on 03/06/2024 showed hemoglobin of 10, hematocrit 31, MCV 77.2.  White count and platelet count were within normal limits.  Creatinine was 1.65, BUN 46.  TSH was normal at 1.  She was referred to us  for further evaluation of chronic anemia.   She has a long-standing history of anemia and has been taking ferrous sulfate  once daily, which has not been effective. She recently completed five intravenous iron  treatments at [facility] within the last month. No signs of blood loss such as blood in stools, black stools, epistaxis, or gum bleeds. A colonoscopy performed last year showed diverticulosis but no polyps.   Her hemoglobin level was recorded at 10 g/dL in May, which is below the normal range for women. Her white blood cell and platelet counts were normal, and her red blood cells were noted to be small. She has not had any lab work in the past four months.   She mentions taking an unspecified medication for  her kidneys, prescribed by Dr. Dennise, but cannot recall the name. She was informed that a previous yeast infection might have affected her kidney function tests. She has been seeing Dr. Dennise for her kidney issues.   Her last colonoscopy was in April 2024 which showed evidence of diverticulosis.  No polyps or other concerning findings.  She recently received IV iron  infusions, completed at [facility].   On her consultation with us  on 07/08/2024, labs showed stable hemoglobin of 10.2, MCV now normal at 86.6.  White count and platelet count were within normal limits.  Ferritin  527.  Iron  studies showed no evidence of iron  deficiency.  Creatinine 1.22, calcium  normal at 9.8.  LDH close to normal at 203.  Haptoglobin within normal limits.  Folic acid was also normal.  B12 was undetectable. Given CKD and anemia, we pursued workup to rule out monoclonal gammopathy.  SPEP showed no evidence of M spike.  IFE showed unremarkable pattern.  Serum free kappa was slightly increased at 40 mg/L, lambda was normal at 20.3 mg/L, ratio close to normal at 2.1.  Isolated Elevation could be inflammatory response.   It is likely that she has anemia of multifactorial etiology, from iron  deficiency, vitamin B12 deficiency and also CKD related. Consider erythropoietin-stimulating agent if hemoglobin consistently falls below 9.   She was advised to continue oral iron  supplements.  Started her on vitamin B12 1000 mcg orally daily from 07/22/2024.  If persistent vitamin B12 deficiency is noted, we will pursue workup for pernicious anemia and also start parenteral B12 supplementation.   REVIEW OF SYSTEMS:    Review of Systems - Oncology  All other pertinent systems were reviewed with the patient and are negative.  I have reviewed the past medical history, past surgical history, social history and family history with the patient and they are unchanged from previous note.  ALLERGIES:  She is allergic to bee venom.  MEDICATIONS:  Current Outpatient Medications  Medication Sig Dispense Refill   cyanocobalamin (VITAMIN B12) 1000 MCG tablet Take 1 tablet (1,000 mcg total) by mouth daily. 30 tablet 0   cyanocobalamin (VITAMIN B12) 1000 MCG tablet Take 1 tablet (1,000 mcg total) by mouth daily. 90 tablet 1   albuterol  (PROVENTIL ) (2.5 MG/3ML) 0.083% nebulizer solution Take 3 mLs (2.5 mg total) by nebulization every 6 (six) hours as needed for wheezing or shortness of breath. 75 mL 12   albuterol  (VENTOLIN  HFA) 108 (90 Base) MCG/ACT inhaler Inhale 1-2 puffs into the lungs every 4 (four) hours as  needed for wheezing or shortness of breath. 1 each 11   Alcohol  Swabs  (ALCOHOL  PREP) 70 % PADS USE TO CHECK BLOOD SUGAR ONCE DAILY 100 each 3   aspirin  EC 81 MG tablet Take 81 mg by mouth at bedtime. Swallow whole.     atorvastatin  (LIPITOR) 40 MG tablet Take 1 tablet (40 mg total) by mouth at bedtime. 90 tablet 3   Blood Glucose Monitoring Suppl (TRUE METRIX GO GLUCOSE METER) w/Device KIT Use as instructed to check blood sugar 2 times daily 1 kit 0   bumetanide  (BUMEX ) 1 MG tablet Take 2 tablets (2 mg total) by mouth at bedtime. 180 tablet 3   cholecalciferol (VITAMIN D) 1000 UNITS tablet Take 2,000 Units by mouth at bedtime.     ENTRESTO  97-103 MG Take 1 tablet by mouth 2 (two) times daily. 60 tablet 5   EPIPEN  2-PAK 0.3 MG/0.3ML SOAJ injection INJECT 0.3 MLS (0.3 MG TOTAL) INTO THE MUSCLE ONCE. (  Patient taking differently: Inject 0.3 mg into the muscle as needed for anaphylaxis.) 1 Device 0   Fexofenadine HCl (ALLEGRA PO) Take 1 tablet by mouth daily at 12 noon.     glimepiride  (AMARYL ) 1 MG tablet Take 1 tablet (1 mg total) by mouth daily with breakfast. 90 tablet 3   glucose blood (TRUE METRIX BLOOD GLUCOSE TEST) test strip USE AS INSTRUCTED TO CHECK BLOOD SUGAR TWICE DAILY E11.65 200 strip 3   hydrALAZINE  (APRESOLINE ) 100 MG tablet Take 1 tablet (100 mg total) by mouth 3 (three) times daily. 270 tablet 3   Iron , Ferrous Sulfate , 325 (65 Fe) MG TABS Take 1 tablet by mouth daily.     isosorbide  mononitrate (IMDUR ) 120 MG 24 hr tablet Take 1 tablet (120 mg total) by mouth daily. 90 tablet 3   ketoconazole  (NIZORAL ) 2 % cream Apply 1 Application topically 2 (two) times daily as needed (yeast infection). 60 g 5   metFORMIN  (GLUCOPHAGE -XR) 500 MG 24 hr tablet TAKE 2 TABLETS TWICE A DAY WITH MEALS (Patient taking differently: Take 1,000 mg by mouth in the morning and at bedtime. TAKE 2 TABLETS TWICE A DAY WITH MEALS) 360 tablet 3   metoprolol  succinate (TOPROL -XL) 100 MG 24 hr tablet TAKE 1 TABLET BY  MOUTH DAILY. TAKE WITH OR IMMEDIATELY FOLLOWING A MEAL. 90 tablet 3   omeprazole  (PRILOSEC) 40 MG capsule Take 1 capsule (40 mg total) by mouth daily. 90 capsule 3   polyvinyl alcohol  (LIQUIFILM TEARS) 1.4 % ophthalmic solution Place 1 drop into both eyes daily as needed for dry eyes.     sitaGLIPtin  (JANUVIA ) 100 MG tablet Take 1 tablet (100 mg total) by mouth daily. 90 tablet 3   triamcinolone  cream (KENALOG ) 0.1 % Apply 1 Application topically 2 (two) times daily as needed (eczema). 453.6 g 5   TRUEplus Lancets 33G MISC USE AS INSTRUCTED TO CHECK BLOOD SUGAR 2 TIMES DAILY 200 each 3   No current facility-administered medications for this visit.    PHYSICAL EXAMINATION:   Onc Performance Status - 07/22/24 1000       ECOG Perf Status   ECOG Perf Status Ambulatory and capable of all selfcare but unable to carry out any work activities.  Up and about more than 50% of waking hours      KPS SCALE   KPS % SCORE Normal activity with effort, some s/s of disease          LABORATORY DATA:   I have reviewed the data as listed.  Recent Results (from the past 2160 hours)  Multiple Myeloma Panel (SPEP&IFE w/QIG)     Status: None   Collection Time: 07/08/24 10:31 AM  Result Value Ref Range   IgG (Immunoglobin G), Serum 1,312 586 - 1,602 mg/dL   IgA 850 64 - 577 mg/dL   IgM (Immunoglobulin M), Srm 104 26 - 217 mg/dL   Total Protein ELP 7.0 6.0 - 8.5 g/dL   Albumin SerPl Elph-Mcnc 3.5 2.9 - 4.4 g/dL   Alpha 1 0.3 0.0 - 0.4 g/dL   Alpha2 Glob SerPl Elph-Mcnc 0.8 0.4 - 1.0 g/dL   B-Globulin SerPl Elph-Mcnc 1.0 0.7 - 1.3 g/dL   Gamma Glob SerPl Elph-Mcnc 1.4 0.4 - 1.8 g/dL   M Protein SerPl Elph-Mcnc Not Observed Not Observed g/dL   Globulin, Total 3.5 2.2 - 3.9 g/dL   Albumin/Glob SerPl 1.1 0.7 - 1.7   IFE 1 Comment     Comment: (NOTE) The immunofixation pattern appears unremarkable.  Evidence of monoclonal protein is not apparent.    Please Note Comment     Comment: (NOTE) Protein  electrophoresis scan will follow via computer, mail, or courier delivery. Performed At: [facility] [address] [place], [place] [id number] Jennette Shorter MD Ey:[id number]   Kappa/lambda light chains     Status: Abnormal   Collection Time: 07/08/24 10:31 AM  Result Value Ref Range   Kappa free light chain 43.0 (H) 3.3 - 19.4 mg/L   Lambda free light chains 20.3 5.7 - 26.3 mg/L   Kappa, lambda light chain ratio 2.12 (H) 0.26 - 1.65    Comment: (NOTE) Performed At: [facility] [address] [place], [place] [id number] Jennette Shorter MD Ey:[id number]   Haptoglobin     Status: None   Collection Time: 07/08/24 10:31 AM  Result Value Ref Range   Haptoglobin 186 42 - 346 mg/dL    Comment: (NOTE) Performed At: [facility] [address] [place], [place] [id number] Jennette Shorter MD Ey:[id number]   Lactate dehydrogenase     Status: Abnormal   Collection Time: 07/08/24 10:31 AM  Result Value Ref Range   LDH 203 (H) 98 - 192 U/L    Comment: Performed at [facility], [address] [place], [place] [address]  Folate     Status: None   Collection Time: 07/08/24 10:31 AM  Result Value Ref Range   Folate 13.2 >5.9 ng/mL    Comment: Performed at [facility], [address], [place], [place] [address]  Vitamin B12     Status: Abnormal   Collection Time: 07/08/24 10:31 AM  Result Value Ref Range   Vitamin B-12 <150 (L) 180 - 914 pg/mL    Comment: (NOTE) This assay is not validated for testing neonatal or myeloproliferative syndrome specimens for Vitamin B12 levels. Performed at [facility], [address], [place], [place] [address]   Ferritin     Status: Abnormal   Collection Time: 07/08/24 10:31 AM  Result Value Ref Range   Ferritin 527 (H) 11 - 307 ng/mL    Comment: Performed at [facility], [address], [place], [place] [address]  Iron  and TIBC     Status: Abnormal   Collection Time:  07/08/24 10:31 AM  Result Value Ref Range   Iron  44 28 - 170 ug/dL   TIBC 750 (L) 749 - 549 ug/dL   Saturation Ratios 18 10.4 - 31.8 %   UIBC 205 ug/dL    Comment: Performed at [facility], [address] [place], [place] [address]  CMP (Cancer Center only)     Status: Abnormal   Collection Time: 07/08/24 10:31 AM  Result Value Ref Range   Sodium 141 135 - 145 mmol/L   Potassium 4.6 3.5 - 5.1 mmol/L   Chloride 108 98 - 111 mmol/L   CO2 21 (L) 22 - 32 mmol/L   Glucose, Bld 99 70 - 99 mg/dL    Comment: Glucose reference range applies only to samples taken after fasting for at least 8 hours.   BUN 29 (H) 8 - 23 mg/dL   Creatinine 8.77 (H) 9.55 - 1.00 mg/dL   Calcium  9.8 8.9 - 10.3 mg/dL   Total Protein 7.7 6.5 - 8.1 g/dL   Albumin 4.3 3.5 - 5.0 g/dL   AST 20 15 - 41 U/L   ALT 9 0 - 44 U/L   Alkaline Phosphatase 72 38 - 126 U/L   Total  Bilirubin 0.3 0.0 - 1.2 mg/dL   GFR, Estimated 46 (L) >60 mL/min    Comment: (NOTE) Calculated using the CKD-EPI Creatinine Equation (2021)    Anion gap 12 5 - 15    Comment: Performed at [facility], [address] [place], [place] [address]  CBC with Differential (Cancer Center Only)     Status: Abnormal   Collection Time: 07/08/24 10:31 AM  Result Value Ref Range   WBC Count 6.4 4.0 - 10.5 K/uL   RBC 3.87 3.87 - 5.11 MIL/uL   Hemoglobin 10.2 (L) 12.0 - 15.0 g/dL   HCT 66.4 (L) 63.9 - 53.9 %   MCV 86.6 80.0 - 100.0 fL   MCH 26.4 26.0 - 34.0 pg   MCHC 30.4 30.0 - 36.0 g/dL   RDW 85.3 88.4 - 84.4 %   Platelet Count 232 150 - 400 K/uL   nRBC 0.0 0.0 - 0.2 %   Neutrophils Relative % 65 %   Neutro Abs 4.1 1.7 - 7.7 K/uL   Lymphocytes Relative 28 %   Lymphs Abs 1.8 0.7 - 4.0 K/uL   Monocytes Relative 5 %   Monocytes Absolute 0.3 0.1 - 1.0 K/uL   Eosinophils Relative 1 %   Eosinophils Absolute 0.1 0.0 - 0.5 K/uL   Basophils Relative 1 %   Basophils Absolute 0.0 0.0 - 0.1 K/uL   Immature Granulocytes 0 %   Abs  Immature Granulocytes 0.02 0.00 - 0.07 K/uL    Comment: Performed at [facility], [address] [place], [place] [address]     RADIOGRAPHIC STUDIES:  No recent pertinent imaging studies available to review.  Orders Placed This Encounter  Procedures   CBC with Differential (Cancer Center Only)    Standing Status:   Future    Expected Date:   11/04/2024    Expiration Date:   02/02/2025   CMP (Cancer Center only)    Standing Status:   Future    Expected Date:   11/04/2024    Expiration Date:   02/02/2025   Vitamin B12    Standing Status:   Future    Expected Date:   11/04/2024    Expiration Date:   02/02/2025   Methylmalonic acid, serum    Standing Status:   Future    Expected Date:   11/04/2024    Expiration Date:   02/02/2025   Folate    Standing Status:   Future    Expected Date:   11/04/2024    Expiration Date:   02/02/2025   Kappa/lambda light chains    Standing Status:   Future    Expected Date:   11/04/2024    Expiration Date:   07/22/2025     Future Appointments  Date Time Provider Department Center  07/22/2024  3:15 PM Autumn Millman, MD [facility] None  07/30/2024  8:40 AM Trixie File, MD [facility] None  11/04/2024  9:30 AM [facility] PHLEBOTOMIST [facility] None  11/04/2024  9:45 AM Alexah Kivett, Millman, MD [facility] None    This document was completed utilizing speech recognition software. Grammatical errors, random word insertions, pronoun errors, and incomplete sentences are an occasional consequence of this system due to software limitations, ambient noise, and hardware issues. Any formal questions or concerns about the content, text or information contained within the body of this dictation should be directly addressed to the provider for clarification.

## 2024-07-22 NOTE — Assessment & Plan Note (Addendum)
 Vitamin B12 was undetectable on her consultation with us  on 07/08/2024.  Started her on vitamin B12 orally 1000 mcg daily.  We will recheck vitamin B12 levels on return visit.  If persistent deficiency is noted, we will pursue testing for pernicious anemia and proceed with parenteral B12 supplementation.

## 2024-07-22 NOTE — Assessment & Plan Note (Signed)
 Patient has had chronic anemia at least since 2020.  Recently she was found to have iron  deficiency and anemia with hemoglobin of 10 in May 2025.  She has been on oral iron  supplements once daily and tolerating it well.  She recently received 5 doses of IV Venofer  for iron  deficiency, as arranged by her PCP.  Given persistent anemia, referral was sent to us  for further evaluation.  No evidence of active bleeding or blood loss.   On her consultation with us  on 07/08/2024, labs showed stable hemoglobin of 10.2, MCV now normal at 86.6.  White count and platelet count were within normal limits.  Ferritin 527.  Iron  studies showed no evidence of iron  deficiency.  Creatinine 1.22, calcium  normal at 9.8.  LDH close to normal at 203.  Haptoglobin within normal limits.  Folic acid was also normal.  B12 was undetectable. Given CKD and anemia, we pursued workup to rule out monoclonal gammopathy.  SPEP showed no evidence of M spike.  IFE showed unremarkable pattern.  Serum free kappa was slightly increased at 40 mg/L, lambda was normal at 20.3 mg/L, ratio close to normal at 2.1.  Isolated Elevation could be inflammatory response.   It is likely that she has anemia of multifactorial etiology, from iron  deficiency, vitamin B12 deficiency and also CKD related. Consider erythropoietin-stimulating agent if hemoglobin consistently falls below 9.   She was advised to continue oral iron  supplements.  Started her on vitamin B12 1000 mcg orally daily from 07/22/2024.  If persistent vitamin B12 deficiency is noted, we will pursue workup for pernicious anemia and also start parenteral B12 supplementation.  - Plan follow-up appointment in four months to reassess anemia status and repeat necessary blood tests.

## 2024-07-23 ENCOUNTER — Telehealth: Payer: Self-pay

## 2024-07-23 ENCOUNTER — Other Ambulatory Visit: Payer: Self-pay

## 2024-07-23 MED ORDER — ISOSORBIDE MONONITRATE ER 120 MG PO TB24: 120.0000 mg | ORAL_TABLET | Freq: Every day | ORAL | 3 refills | Status: AC

## 2024-07-23 NOTE — Telephone Encounter (Signed)
 Copied from CRM [id number]. Topic: Clinical - Medication Question >> Jul 22, 2024  8:08 AM Pinkey ORN wrote: Reason for CRM: isosorbide  mononitrate (IMDUR ) 120 MG 24 hr tablet

## 2024-07-23 NOTE — Addendum Note (Signed)
 Addended by: JOHNNY SENIOR A on: 07/23/2024 04:56 PM   Modules accepted: Orders

## 2024-07-23 NOTE — Telephone Encounter (Signed)
 Done

## 2024-07-30 ENCOUNTER — Ambulatory Visit: Admitting: Internal Medicine

## 2024-07-30 ENCOUNTER — Encounter: Payer: Self-pay | Admitting: Internal Medicine

## 2024-07-30 VITALS — BP 122/70 | HR 76 | Ht 63.0 in | Wt 225.6 lb

## 2024-07-30 DIAGNOSIS — N183 Chronic kidney disease, stage 3 unspecified: Secondary | ICD-10-CM | POA: Diagnosis not present

## 2024-07-30 DIAGNOSIS — E782 Mixed hyperlipidemia: Secondary | ICD-10-CM

## 2024-07-30 DIAGNOSIS — E66812 Obesity, class 2: Secondary | ICD-10-CM | POA: Diagnosis not present

## 2024-07-30 DIAGNOSIS — E1122 Type 2 diabetes mellitus with diabetic chronic kidney disease: Secondary | ICD-10-CM | POA: Diagnosis not present

## 2024-07-30 DIAGNOSIS — Z7984 Long term (current) use of oral hypoglycemic drugs: Secondary | ICD-10-CM

## 2024-07-30 DIAGNOSIS — N1831 Chronic kidney disease, stage 3a: Secondary | ICD-10-CM | POA: Diagnosis not present

## 2024-07-30 LAB — POCT GLYCOSYLATED HEMOGLOBIN (HGB A1C): Hemoglobin A1C: 6 % — AB (ref 4.0–5.6)

## 2024-07-30 MED ORDER — METFORMIN HCL ER 500 MG PO TB24
1000.0000 mg | ORAL_TABLET | Freq: Two times a day (BID) | ORAL | 3 refills | Status: DC
Start: 2024-07-30 — End: 2024-07-30

## 2024-07-30 MED ORDER — METFORMIN HCL ER 500 MG PO TB24: 1000.0000 mg | ORAL_TABLET | Freq: Two times a day (BID) | ORAL | 3 refills | Status: AC

## 2024-07-30 NOTE — Progress Notes (Signed)
 Patient ID: Krista Benjamin, female   DOB: 06-02-1951, 73 y.o.   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  Last visit 4 months ago. She has [employer] + [employer].   Interim history: No increased urination, blurry vision, nausea, chest pain.   She had a fall in the street while at work >> hurt L hand and knee. Now walks with a walker.  Reviewed HbA1c levels: Lab Results  Component Value Date   HGBA1C 6.1 (A) 01/29/2024   HGBA1C 6.6 (A) 09/26/2023   HGBA1C 6.9 (H) 04/04/2023   HGBA1C 6.3 (A) 12/27/2022   HGBA1C 6.7 (A) 08/25/2022   HGBA1C 7.2 (H) 04/01/2022   HGBA1C 6.2 (A) 12/01/2021   HGBA1C 7.1 (A) 07/19/2021   HGBA1C 6.1 (A) 11/20/2020   HGBA1C 6.5 (A) 07/24/2020   HGBA1C 7.7 (A) 03/17/2020   HGBA1C 6.5 (A) 11/12/2019   HGBA1C 6.4 (A) 07/08/2019   HGBA1C 7.3 (H) 04/25/2019   HGBA1C 7.1 (A) 11/09/2018   HGBA1C 7.7 (A) 07/26/2018   HGBA1C 9.3 (A) 04/10/2018   HGBA1C 8.1 11/07/2017   HGBA1C 8.8 08/01/2017   HGBA1C 9.2 04/27/2017  Prev. 5.9% on 12/2012, previously 6.1%.  Pt is on a regimen of: - Metformin  ER 1000 mg 2x a day with meals  - Tradjenta  5 >> Januvia  100 mg before breakfast - per insurance pref. - Amaryl  2 mg before dinner >> at bedtime >> 0.5-1 mg before meals Other medications tried: Rec'd GLP1 agonist >> Victoza covered >> she was afraid of SEs. We tried Jardiance  10 mg daily in am - added 04/2017 >> yeast inf >> stopped We tried Invokana  100 mg in am >> stopped after 3 days >> nausea, diarrhea We stopped Lantus 10 units qhs. We stopped Actos 45 mg (was on it since 07/2001) Metformin  IR 1000 mg bid >> stopped b/c Nausea/Diarrhea She was on Novolog 8 units tid ac, when sugars >140  We stopped Amaryl  4 mg before breakfast in 11/2020 but had to restart 2/2 high CBGs.SABRA  Pt checks her sugars 1 times a  day: - am: 87-140 >> 80-137, 142 >> 119-134, 145 >> 84-108 >> 90-100 - 2h after b'fast: 115 >> n/c >> 130 after coffee >> n/c - lunch:  1n/c >> 156 >> 79 >> n/c >> 67, 87-109 >> n/c - 2h after lunch: 303 >> n/c >> 201 >> 104 >> n/c >> <170 - dinner: 171, 178, 195 (steroids) >> n/c >> 62 >> n/c - 2h after dinner: 197, 210, 231  >> 115-173 >> n/c >> 123 >> 127 >> <170 - bedtime:  134 >> 107, 211 >> n/c >> 127 >> n/c - night: 75 >> n/c >> 170 >> 145 >> 100 >> n/c Lowest sugar:  53 >> ... 80 >> 119 >> 96 >> 90; she has hypoglycemia awareness in the 70s. Highest sugar: 364 >> .SABRA. 145 >> 127 >> <170  Glucometer: One Touch Verio Flex  She lost 80 pounds since 2012 >> She was able to come off insulin  then. She saw Leita Constable (nutritionist) on 01/01/2015.  -+ CKD stage III - now seeing nephrology (Dr. Dennise). Latest BUN/creatinine was:  07/08/2024: GFR 46 Lab Results  Component Value Date   BUN 29 (H) 07/08/2024   Lab Results  Component Value Date   CREATININE 1.22 (H) 07/08/2024   Lab Results  Component Value Date  GFR 30.56 (L) 03/06/2024   GFR 54.79 (L) 04/04/2023   GFR 46.80 (L) 04/01/2022   GFR 62.11 03/09/2021   GFR 68.89 04/25/2019   GFR 81.11 04/20/2018   GFR 75.32 11/28/2017   GFR 100.72 02/20/2017   GFR 69.70 06/02/2016   GFR 85.07 08/26/2015   Lab Results  Component Value Date   MICRALBCREAT 110 (H) 01/29/2024   MICRALBCREAT 47 (H) 09/26/2023   MICRALBCREAT 5.4 11/21/2007  On Entresto .  -+ HL.  Latest lipid profile: Lab Results  Component Value Date   CHOL 143 03/06/2024   HDL 44.30 03/06/2024   LDLCALC 79 03/06/2024   TRIG 96.0 03/06/2024   CHOLHDL 3 03/06/2024  On Lipitor 40.  - last eye exam: 07/10/2023: No DR; previous history of background DR; + h/o B cataract sx. Coming up in 09/2024.  - no numbness and tingling in her legs.  Last foot exam 01/29/2024.  She also has a history of HTN, iron  deficiency anemia-on iron  therapy, aortic valve  disease/murmur, left ventricular hypertrophy per EKG, obesity, multinodular goiter, fibroids.  She had shingles in 11/2014 and 02/2016.  She fell and fractured R wrist 06/2018. She had another, right radial, fracture 01/2019. She was in the ED on 10/05/2021 with hypertensive urgency (systolic BP in the 200s).  ROS: + See HPI  I reviewed pt's medications, allergies, PMH, social hx, family hx, and changes were documented in the history of present illness. Otherwise, unchanged from my initial visit note.  Past Medical History:  Diagnosis Date   Anemia    Aortic stenosis    Mild   Arthritis    Cataract    both eyes  (cataracts removed)   COPD (chronic obstructive pulmonary disease) (HCC)    Diabetes mellitus    sees Dr. Trixie    Gynecological examination    sees Dr. Rosalynn    Hyperlipidemia    Hypertension    sees Dr. Ethan Schilling   Stroke [facility])    TIA   Past Surgical History:  Procedure Laterality Date   CARPAL TUNNEL RELEASE  01/22/2014   Left Hand    CATARACT EXTRACTION     Lens surgery   COLONOSCOPY  02/02/2023   per Dr. Legrand, no polyps, no repeats needed   excision of benign cyst     from left maxilla and left maxillary sinus   KNEE SURGERY     RIGHT/LEFT HEART CATH AND CORONARY ANGIOGRAPHY N/A 09/14/2018   Procedure: RIGHT/LEFT HEART CATH AND CORONARY ANGIOGRAPHY;  Surgeon: Swazil[place], Peter M, MD;  Location: [facility] INVASIVE CV LAB;  Service: Cardiovascular;  Laterality: N/A;   SHOULDER ARTHROSCOPY W/ ROTATOR CUFF REPAIR Left 09/23/2013   per Dr. Lorenso Hint at [facility] center in [place]    Social History   Socioeconomic History   Marital status: Married    Spouse name: Not on file   Number of children: Not on file   Years of education: Not on file   Highest education level: Not on file  Occupational History   Not on file  Tobacco Use   Smoking status: Never   Smokeless tobacco: Never  Vaping Use   Vaping status: Never Used  Substance and Sexual  Activity   Alcohol  use: No    Alcohol /week: 0.0 standard drinks of alcohol    Drug use: No   Sexual activity: Not Currently    Birth control/protection: Post-menopausal  Other Topics Concern   Not on file  Social History Narrative   Not on file  Social Drivers of Corporate investment banker Strain: Low Risk  (05/02/2022)   Overall Financial Resource Strain (CARDIA)    Difficulty of Paying Living Expenses: Not hard at all  Food Insecurity: No Food Insecurity (07/08/2024)   Hunger Vital Sign    Worried About Running Out of Food in the Last Year: Never true    Ran Out of Food in the Last Year: Never true  Transportation Needs: No Transportation Needs (07/08/2024)   PRAPARE - Administrator, Civil Service (Medical): No    Lack of Transportation (Non-Medical): No  Physical Activity: Inactive (05/02/2022)   Exercise Vital Sign    Days of Exercise per Week: 0 days    Minutes of Exercise per Session: 0 min  Stress: No Stress Concern Present (05/02/2022)   [employer] of Occupational Health - Occupational Stress Questionnaire    Feeling of Stress : Not at all  Social Connections: Socially Integrated (05/02/2022)   Social Connection and Isolation Panel    Frequency of Communication with Friends and Family: More than three times a week    Frequency of Social Gatherings with Friends and Family: More than three times a week    Attends Religious Services: More than 4 times per year    Active Member of [employer] or Organizations: Yes    Attends Engineer, structural: More than 4 times per year    Marital Status: Married  Catering manager Violence: Not At Risk (07/08/2024)   Humiliation, Afraid, Rape, and Kick questionnaire    Fear of Current or Ex-Partner: No    Emotionally Abused: No    Physically Abused: No    Sexually Abused: No   Current Outpatient Medications on File Prior to Visit  Medication Sig Dispense Refill   albuterol  (PROVENTIL ) (2.5 MG/3ML) 0.083%  nebulizer solution Take 3 mLs (2.5 mg total) by nebulization every 6 (six) hours as needed for wheezing or shortness of breath. 75 mL 12   albuterol  (VENTOLIN  HFA) 108 (90 Base) MCG/ACT inhaler Inhale 1-2 puffs into the lungs every 4 (four) hours as needed for wheezing or shortness of breath. 1 each 11   Alcohol  Swabs  (ALCOHOL  PREP) 70 % PADS USE TO CHECK BLOOD SUGAR ONCE DAILY 100 each 3   aspirin  EC 81 MG tablet Take 81 mg by mouth at bedtime. Swallow whole.     atorvastatin  (LIPITOR) 40 MG tablet Take 1 tablet (40 mg total) by mouth at bedtime. 90 tablet 3   Blood Glucose Monitoring Suppl (TRUE METRIX GO GLUCOSE METER) w/Device KIT Use as instructed to check blood sugar 2 times daily 1 kit 0   bumetanide  (BUMEX ) 1 MG tablet Take 2 tablets (2 mg total) by mouth at bedtime. 180 tablet 3   cholecalciferol (VITAMIN D) 1000 UNITS tablet Take 2,000 Units by mouth at bedtime.     cyanocobalamin (VITAMIN B12) 1000 MCG tablet Take 1 tablet (1,000 mcg total) by mouth daily. 30 tablet 0   cyanocobalamin (VITAMIN B12) 1000 MCG tablet Take 1 tablet (1,000 mcg total) by mouth daily. 90 tablet 1   ENTRESTO  97-103 MG Take 1 tablet by mouth 2 (two) times daily. 60 tablet 5   EPIPEN  2-PAK 0.3 MG/0.3ML SOAJ injection INJECT 0.3 MLS (0.3 MG TOTAL) INTO THE MUSCLE ONCE. (Patient taking differently: Inject 0.3 mg into the muscle as needed for anaphylaxis.) 1 Device 0   Fexofenadine HCl (ALLEGRA PO) Take 1 tablet by mouth daily at 12 noon.     glimepiride  (AMARYL )  1 MG tablet Take 1 tablet (1 mg total) by mouth daily with breakfast. 90 tablet 3   glucose blood (TRUE METRIX BLOOD GLUCOSE TEST) test strip USE AS INSTRUCTED TO CHECK BLOOD SUGAR TWICE DAILY E11.65 200 strip 3   hydrALAZINE  (APRESOLINE ) 100 MG tablet Take 1 tablet (100 mg total) by mouth 3 (three) times daily. 270 tablet 3   Iron , Ferrous Sulfate , 325 (65 Fe) MG TABS Take 1 tablet by mouth daily.     isosorbide  mononitrate (IMDUR ) 120 MG 24 hr tablet Take 1  tablet (120 mg total) by mouth daily. 90 tablet 3   ketoconazole  (NIZORAL ) 2 % cream Apply 1 Application topically 2 (two) times daily as needed (yeast infection). 60 g 5   metFORMIN  (GLUCOPHAGE -XR) 500 MG 24 hr tablet TAKE 2 TABLETS TWICE A DAY WITH MEALS (Patient taking differently: Take 1,000 mg by mouth in the morning and at bedtime. TAKE 2 TABLETS TWICE A DAY WITH MEALS) 360 tablet 3   metoprolol  succinate (TOPROL -XL) 100 MG 24 hr tablet TAKE 1 TABLET BY MOUTH DAILY. TAKE WITH OR IMMEDIATELY FOLLOWING A MEAL. 90 tablet 3   omeprazole  (PRILOSEC) 40 MG capsule Take 1 capsule (40 mg total) by mouth daily. 90 capsule 3   polyvinyl alcohol  (LIQUIFILM TEARS) 1.4 % ophthalmic solution Place 1 drop into both eyes daily as needed for dry eyes.     sitaGLIPtin  (JANUVIA ) 100 MG tablet Take 1 tablet (100 mg total) by mouth daily. 90 tablet 3   triamcinolone  cream (KENALOG ) 0.1 % Apply 1 Application topically 2 (two) times daily as needed (eczema). 453.6 g 5   TRUEplus Lancets 33G MISC USE AS INSTRUCTED TO CHECK BLOOD SUGAR 2 TIMES DAILY 200 each 3   No current facility-administered medications on file prior to visit.   Allergies  Allergen Reactions   Bee Venom Anaphylaxis   Family History  Problem Relation Age of Onset   Coronary artery disease Mother    Colon cancer Mother    Coronary artery disease Brother    Cancer Other        fhx   Diabetes Other        fhx   Hyperlipidemia Other        fhx   Hypertension Other        fhx   Stroke Other        fhx   Colon polyps Neg Hx    Esophageal cancer Neg Hx    Rectal cancer Neg Hx    Stomach cancer Neg Hx    PE: BP 122/70   Pulse 76   Ht 5' 3 (1.6 m)   Wt 225 lb 9.6 oz (102.3 kg)   SpO2 98%   BMI 39.96 kg/m    Wt Readings from Last 10 Encounters:  07/30/24 225 lb 9.6 oz (102.3 kg)  07/08/24 230 lb (104.3 kg)  03/07/24 229 lb (103.9 kg)  03/06/24 247 lb (112 kg)  01/29/24 246 lb 9.6 oz (111.9 kg)  11/01/23 246 lb (111.6 kg)   10/23/23 248 lb 9.6 oz (112.8 kg)  09/26/23 250 lb (113.4 kg)  09/18/23 249 lb (112.9 kg)  05/25/23 237 lb 9.6 oz (107.8 kg)   Constitutional: overweight, in NAD, walks with a walker Eyes:EOMI, no exophthalmos ENT: no thyromegaly, no cervical lymphadenopathy Cardiovascular: RRR, No MRG, + B LE edema. Respiratory: CTA B Musculoskeletal: no deformities Skin: rash B legs Neurological: no tremor with outstretched hands  ASSESSMENT: 1. DM2, non-insulin -dependent, uncontrolled, with complications -  CKD stage 3a-b + moderately increased albuminuria - background DR  2.  Obesity class II  3. HL  PLAN:  1.  Patient with longstanding, uncontrolled, type 2 diabetes, on metformin , DPP 4 inhibitor and sulfonylurea, with improved HbA1c at last visit, when this returned at 6.1%, lowest in years.  Sugars were all at goal.  She was not checking blood sugars later in the day and I advised her to start.  We did not change her regimen at that time. -, Her sugars are all at goal.  She is taking Amaryl  seldom, before large meals.  We discussed about continuing this.  Will continue the rest of the regimen.  I refilled her metformin  today.  Since her GFR has increased, we can continue at the current dose. -I advised her to: Patient Instructions  Please continue: - Metformin  ER 1000 mg 2x a day with meals  - Januvia  100 mg before breakfast - Amaryl  0.5-1 mg 15 min before a larger meal  Please return in 4-6 months with your sugar log.   - we checked her HbA1c: 6.0% (lower) - advised to check sugars at different times of the day - 1x a day, rotating check times - advised for yearly eye exams >> she is UTD - at last visit, she had an elevated ACR at 110.  She had an active yeast infection so I did not recommend an SGLT2 inhibitor at that time.  She continues on Entresto .  As of now, she establish care with Dr. Dennise with nephrology. - return to clinic in 4-6 months  2.  Obesity class II -She continues  metformin  and Januvia  which are both weight neutral.  She is also on low-dose Amaryl  which can be weight inducing. - Weight was stable at last visit, previously gained 13 pounds - She lost 21 pounds since then!  3. HL - Latest lipid panel was reviewed from 02/2024: LDL above our goal of less than 70, otherwise fractions at goal Lab Results  Component Value Date   CHOL 143 03/06/2024   HDL 44.30 03/06/2024   LDLCALC 79 03/06/2024   TRIG 96.0 03/06/2024   CHOLHDL 3 03/06/2024  - She continues Lipitor 40 mg daily without side effects  Lela Fendt, MD PhD [facility] Endocrinology

## 2024-07-30 NOTE — Patient Instructions (Signed)
 Please continue: - Metformin  ER 1000 mg 2x a day with meals  - Januvia  100 mg before breakfast - Amaryl  0.5-1 mg 15 min before a larger meal  Please return in 4-6 months with your sugar log.

## 2024-08-05 ENCOUNTER — Telehealth: Payer: Self-pay | Admitting: Oncology

## 2024-08-05 ENCOUNTER — Telehealth: Payer: Self-pay | Admitting: *Deleted

## 2024-08-05 NOTE — Telephone Encounter (Signed)
 TC to Pt left vm informing Pt there was a prescription sent to [facility]. Informed to return call if any problems or concerns.

## 2024-08-05 NOTE — Telephone Encounter (Signed)
 Patient left VM requesting refill on her vitamin B12. Appears this has already been ordered for her. Forwarded to Dr. Clovis  nurse to f/u.

## 2024-08-20 ENCOUNTER — Other Ambulatory Visit: Payer: Self-pay

## 2024-08-20 DIAGNOSIS — E538 Deficiency of other specified B group vitamins: Secondary | ICD-10-CM

## 2024-08-20 MED ORDER — VITAMIN B-12 1000 MCG PO TABS: 1000.0000 ug | ORAL_TABLET | Freq: Every day | ORAL | 0 refills | Status: AC

## 2024-09-10 ENCOUNTER — Ambulatory Visit: Admitting: Family Medicine

## 2024-09-10 ENCOUNTER — Encounter: Payer: Self-pay | Admitting: Family Medicine

## 2024-09-10 VITALS — BP 126/74 | HR 75 | Temp 99.3°F | Wt 229.0 lb

## 2024-09-10 DIAGNOSIS — J40 Bronchitis, not specified as acute or chronic: Secondary | ICD-10-CM | POA: Diagnosis not present

## 2024-09-10 MED ORDER — AZITHROMYCIN 250 MG PO TABS: ORAL_TABLET | ORAL | 0 refills | Status: AC

## 2024-09-10 MED ORDER — ALBUTEROL SULFATE (2.5 MG/3ML) 0.083% IN NEBU: 2.5000 mg | INHALATION_SOLUTION | RESPIRATORY_TRACT | 12 refills | Status: AC | PRN

## 2024-09-10 NOTE — Progress Notes (Signed)
   Subjective:    Patient ID: Krista Benjamin, female    DOB: 1951-07-03, 73 y.o.   MRN: [id number]  HPI Here for 3 days of chest congestion and a dry cough. She has been mildly SOB, but her nebulizer and inhaler help with this. No fever.    Review of Systems  Constitutional: Negative.   HENT:  Positive for congestion. Negative for ear pain, postnasal drip, sinus pain and sore throat.   Eyes: Negative.   Respiratory:  Positive for cough, chest tightness and shortness of breath. Negative for wheezing.        Objective:   Physical Exam Constitutional:      Appearance: Normal appearance. She is not ill-appearing.  HENT:     Right Ear: Tympanic membrane, ear canal and external ear normal.     Left Ear: Tympanic membrane, ear canal and external ear normal.     Nose: Nose normal.     Mouth/Throat:     Pharynx: Oropharynx is clear.  Eyes:     Conjunctiva/sclera: Conjunctivae normal.  Pulmonary:     Effort: Pulmonary effort is normal.     Breath sounds: Rhonchi present. No wheezing or rales.  Lymphadenopathy:     Cervical: No cervical adenopathy.  Neurological:     Mental Status: She is alert.           Assessment & Plan:  Bronchitis. Treat with a Zpack.  Garnette Olmsted, MD

## 2024-09-12 ENCOUNTER — Other Ambulatory Visit: Payer: Self-pay | Admitting: Cardiology

## 2024-09-12 ENCOUNTER — Other Ambulatory Visit: Payer: Self-pay | Admitting: Internal Medicine

## 2024-09-12 DIAGNOSIS — E1165 Type 2 diabetes mellitus with hyperglycemia: Secondary | ICD-10-CM

## 2024-09-20 ENCOUNTER — Other Ambulatory Visit: Payer: Self-pay | Admitting: Family Medicine

## 2024-09-20 NOTE — Telephone Encounter (Signed)
 Copied from CRM #[id number]. Topic: Clinical - Medication Refill >> Sep 20, 2024  9:42 AM Rosina BIRCH wrote: Medication: isosorbide  mononitrate (I told the patient she has three refills at [facility], [facility].)  Has the patient contacted their pharmacy? No (Agent: If no, request that the patient contact the pharmacy for the refill. If patient does not wish to contact the pharmacy document the reason why and proceed with request.) (Agent: If yes, when and what did the pharmacy advise?)  This is the patient's preferred pharmacy:  [id number] - [place], [place] - [address] [address] [place] [address] Phone: [phone] Fax: [phone]  Is this the correct pharmacy for this prescription? Yes If no, delete pharmacy and type the correct one.   Has the prescription been filled recently? Yes  Is the patient out of the medication? No  Has the patient been seen for an appointment in the last year OR does the patient have an upcoming appointment? Yes  Can we respond through MyChart? Yes  Agent: Please be advised that Rx refills may take up to 3 business days. We ask that you follow-up with your pharmacy.

## 2024-10-31 ENCOUNTER — Telehealth: Payer: Self-pay | Admitting: Oncology

## 2024-10-31 NOTE — Telephone Encounter (Signed)
 Rescheduled appointments due to incoming inclement weather. Talked with the patient and she is aware of the changes made to her upcoming appointments.

## 2024-11-04 ENCOUNTER — Other Ambulatory Visit

## 2024-11-04 ENCOUNTER — Ambulatory Visit: Admitting: Oncology

## 2024-11-18 ENCOUNTER — Inpatient Hospital Stay

## 2024-11-18 ENCOUNTER — Inpatient Hospital Stay: Admitting: Oncology
